# Patient Record
Sex: Male | Born: 1955 | Race: Black or African American | Hispanic: No | Marital: Single | Smoking: Current every day smoker
Health system: Southern US, Community
[De-identification: ages and names within clinical notes are randomized; demographics above are authoritative.]

## PROBLEM LIST (undated history)

## (undated) DIAGNOSIS — F319 Bipolar disorder, unspecified: Secondary | ICD-10-CM

## (undated) DIAGNOSIS — J45909 Unspecified asthma, uncomplicated: Secondary | ICD-10-CM

## (undated) DIAGNOSIS — F419 Anxiety disorder, unspecified: Secondary | ICD-10-CM

## (undated) DIAGNOSIS — E785 Hyperlipidemia, unspecified: Secondary | ICD-10-CM

## (undated) DIAGNOSIS — K219 Gastro-esophageal reflux disease without esophagitis: Secondary | ICD-10-CM

## (undated) DIAGNOSIS — F1010 Alcohol abuse, uncomplicated: Secondary | ICD-10-CM

## (undated) DIAGNOSIS — R972 Elevated prostate specific antigen [PSA]: Secondary | ICD-10-CM

## (undated) DIAGNOSIS — M1990 Unspecified osteoarthritis, unspecified site: Secondary | ICD-10-CM

## (undated) DIAGNOSIS — J449 Chronic obstructive pulmonary disease, unspecified: Secondary | ICD-10-CM

## (undated) DIAGNOSIS — F32A Depression, unspecified: Secondary | ICD-10-CM

## (undated) DIAGNOSIS — H269 Unspecified cataract: Secondary | ICD-10-CM

## (undated) DIAGNOSIS — I7090 Unspecified atherosclerosis: Secondary | ICD-10-CM

## (undated) DIAGNOSIS — F329 Major depressive disorder, single episode, unspecified: Secondary | ICD-10-CM

## (undated) DIAGNOSIS — T7840XA Allergy, unspecified, initial encounter: Secondary | ICD-10-CM

## (undated) DIAGNOSIS — I10 Essential (primary) hypertension: Secondary | ICD-10-CM

## (undated) HISTORY — PX: BACK SURGERY: SHX140

## (undated) HISTORY — DX: Anxiety disorder, unspecified: F41.9

## (undated) HISTORY — DX: Unspecified cataract: H26.9

## (undated) HISTORY — DX: Elevated prostate specific antigen (PSA): R97.20

## (undated) HISTORY — DX: Allergy, unspecified, initial encounter: T78.40XA

## (undated) HISTORY — DX: Alcohol abuse, uncomplicated: F10.10

## (undated) HISTORY — DX: Unspecified atherosclerosis: I70.90

## (undated) HISTORY — DX: Chronic obstructive pulmonary disease, unspecified: J44.9

## (undated) HISTORY — DX: Bipolar disorder, unspecified: F31.9

## (undated) HISTORY — DX: Unspecified asthma, uncomplicated: J45.909

## (undated) HISTORY — PX: SPINE SURGERY: SHX786

## (undated) HISTORY — DX: Hyperlipidemia, unspecified: E78.5

## (undated) HISTORY — DX: Unspecified osteoarthritis, unspecified site: M19.90

## (undated) SURGERY — TURP (TRANSURETHRAL RESECTION OF PROSTATE)
Anesthesia: Choice

---

## 1998-10-09 ENCOUNTER — Encounter: Payer: Self-pay | Admitting: Emergency Medicine

## 1998-10-09 ENCOUNTER — Inpatient Hospital Stay (HOSPITAL_COMMUNITY): Admission: EM | Admit: 1998-10-09 | Discharge: 1998-10-13 | Payer: Self-pay | Admitting: Emergency Medicine

## 1998-10-11 ENCOUNTER — Encounter: Payer: Self-pay | Admitting: Surgery

## 1998-10-12 ENCOUNTER — Encounter: Payer: Self-pay | Admitting: General Surgery

## 1998-10-13 ENCOUNTER — Encounter: Payer: Self-pay | Admitting: General Surgery

## 2002-07-21 ENCOUNTER — Emergency Department (HOSPITAL_COMMUNITY): Admission: EM | Admit: 2002-07-21 | Discharge: 2002-07-21 | Payer: Self-pay | Admitting: Emergency Medicine

## 2004-05-03 ENCOUNTER — Emergency Department (HOSPITAL_COMMUNITY): Admission: EM | Admit: 2004-05-03 | Discharge: 2004-05-03 | Payer: Self-pay | Admitting: *Deleted

## 2006-04-23 ENCOUNTER — Emergency Department (HOSPITAL_COMMUNITY): Admission: EM | Admit: 2006-04-23 | Discharge: 2006-04-23 | Payer: Self-pay | Admitting: Emergency Medicine

## 2006-05-27 ENCOUNTER — Ambulatory Visit: Payer: Self-pay | Admitting: Family Medicine

## 2006-05-27 DIAGNOSIS — K219 Gastro-esophageal reflux disease without esophagitis: Secondary | ICD-10-CM | POA: Insufficient documentation

## 2006-05-27 DIAGNOSIS — J45909 Unspecified asthma, uncomplicated: Secondary | ICD-10-CM | POA: Insufficient documentation

## 2006-05-27 DIAGNOSIS — H269 Unspecified cataract: Secondary | ICD-10-CM | POA: Insufficient documentation

## 2006-05-27 DIAGNOSIS — M129 Arthropathy, unspecified: Secondary | ICD-10-CM | POA: Insufficient documentation

## 2006-05-27 DIAGNOSIS — E785 Hyperlipidemia, unspecified: Secondary | ICD-10-CM | POA: Insufficient documentation

## 2006-05-27 DIAGNOSIS — M545 Low back pain, unspecified: Secondary | ICD-10-CM | POA: Insufficient documentation

## 2006-05-27 DIAGNOSIS — R5381 Other malaise: Secondary | ICD-10-CM | POA: Insufficient documentation

## 2006-05-27 DIAGNOSIS — F319 Bipolar disorder, unspecified: Secondary | ICD-10-CM | POA: Insufficient documentation

## 2006-05-27 DIAGNOSIS — F17200 Nicotine dependence, unspecified, uncomplicated: Secondary | ICD-10-CM | POA: Insufficient documentation

## 2006-05-27 DIAGNOSIS — R5383 Other fatigue: Secondary | ICD-10-CM

## 2006-05-27 DIAGNOSIS — G43009 Migraine without aura, not intractable, without status migrainosus: Secondary | ICD-10-CM | POA: Insufficient documentation

## 2006-05-27 DIAGNOSIS — F329 Major depressive disorder, single episode, unspecified: Secondary | ICD-10-CM | POA: Insufficient documentation

## 2006-05-27 DIAGNOSIS — J309 Allergic rhinitis, unspecified: Secondary | ICD-10-CM | POA: Insufficient documentation

## 2006-06-02 ENCOUNTER — Encounter (INDEPENDENT_AMBULATORY_CARE_PROVIDER_SITE_OTHER): Payer: Self-pay | Admitting: Family Medicine

## 2006-06-15 ENCOUNTER — Telehealth (INDEPENDENT_AMBULATORY_CARE_PROVIDER_SITE_OTHER): Payer: Self-pay | Admitting: Family Medicine

## 2006-06-26 ENCOUNTER — Encounter (INDEPENDENT_AMBULATORY_CARE_PROVIDER_SITE_OTHER): Payer: Self-pay | Admitting: Family Medicine

## 2006-06-26 LAB — CONVERTED CEMR LAB
Basophils Absolute: 0 10*3/uL (ref 0.0–0.1)
Basophils Relative: 1 % (ref 0–1)
Eosinophils Absolute: 0.1 10*3/uL (ref 0.0–0.7)
Eosinophils Relative: 2 % (ref 0–5)
HCT: 45.1 % (ref 39.0–52.0)
Hemoglobin: 15 g/dL (ref 13.0–17.0)
Lymphocytes Relative: 35 % (ref 12–46)
Lymphs Abs: 1.4 10*3/uL (ref 0.7–3.3)
MCHC: 33.3 g/dL (ref 30.0–36.0)
MCV: 91.7 fL (ref 78.0–100.0)
Monocytes Absolute: 0.6 10*3/uL (ref 0.2–0.7)
Monocytes Relative: 16 % — ABNORMAL HIGH (ref 3–11)
Neutro Abs: 1.9 10*3/uL (ref 1.7–7.7)
Neutrophils Relative %: 48 % (ref 43–77)
Platelets: 325 10*3/uL (ref 150–400)
RBC: 4.92 M/uL (ref 4.22–5.81)
RDW: 12.9 % (ref 11.5–14.0)
WBC: 4 10*3/uL (ref 4.0–10.5)

## 2006-06-29 ENCOUNTER — Ambulatory Visit: Payer: Self-pay | Admitting: Family Medicine

## 2006-08-18 ENCOUNTER — Encounter (INDEPENDENT_AMBULATORY_CARE_PROVIDER_SITE_OTHER): Payer: Self-pay | Admitting: Family Medicine

## 2006-08-25 ENCOUNTER — Encounter (INDEPENDENT_AMBULATORY_CARE_PROVIDER_SITE_OTHER): Payer: Self-pay | Admitting: Family Medicine

## 2006-08-27 ENCOUNTER — Ambulatory Visit: Payer: Self-pay | Admitting: Family Medicine

## 2006-08-27 ENCOUNTER — Telehealth (INDEPENDENT_AMBULATORY_CARE_PROVIDER_SITE_OTHER): Payer: Self-pay | Admitting: *Deleted

## 2006-09-01 ENCOUNTER — Ambulatory Visit (HOSPITAL_COMMUNITY): Admission: RE | Admit: 2006-09-01 | Discharge: 2006-09-01 | Payer: Self-pay | Admitting: Family Medicine

## 2006-09-01 ENCOUNTER — Telehealth (INDEPENDENT_AMBULATORY_CARE_PROVIDER_SITE_OTHER): Payer: Self-pay | Admitting: *Deleted

## 2006-09-01 ENCOUNTER — Encounter (INDEPENDENT_AMBULATORY_CARE_PROVIDER_SITE_OTHER): Payer: Self-pay | Admitting: Family Medicine

## 2006-09-02 ENCOUNTER — Telehealth (INDEPENDENT_AMBULATORY_CARE_PROVIDER_SITE_OTHER): Payer: Self-pay | Admitting: *Deleted

## 2006-09-03 ENCOUNTER — Ambulatory Visit: Payer: Self-pay | Admitting: Family Medicine

## 2006-09-07 ENCOUNTER — Encounter (INDEPENDENT_AMBULATORY_CARE_PROVIDER_SITE_OTHER): Payer: Self-pay | Admitting: Family Medicine

## 2006-09-10 ENCOUNTER — Encounter (INDEPENDENT_AMBULATORY_CARE_PROVIDER_SITE_OTHER): Payer: Self-pay | Admitting: Family Medicine

## 2006-09-14 ENCOUNTER — Encounter (INDEPENDENT_AMBULATORY_CARE_PROVIDER_SITE_OTHER): Payer: Self-pay | Admitting: Family Medicine

## 2006-09-18 ENCOUNTER — Encounter (INDEPENDENT_AMBULATORY_CARE_PROVIDER_SITE_OTHER): Payer: Self-pay | Admitting: Family Medicine

## 2006-09-28 ENCOUNTER — Ambulatory Visit: Payer: Self-pay | Admitting: Family Medicine

## 2006-09-28 DIAGNOSIS — R05 Cough: Secondary | ICD-10-CM | POA: Insufficient documentation

## 2006-09-29 ENCOUNTER — Encounter (INDEPENDENT_AMBULATORY_CARE_PROVIDER_SITE_OTHER): Payer: Self-pay | Admitting: Family Medicine

## 2006-10-05 ENCOUNTER — Ambulatory Visit (HOSPITAL_COMMUNITY): Admission: RE | Admit: 2006-10-05 | Discharge: 2006-10-05 | Payer: Self-pay | Admitting: Family Medicine

## 2006-10-05 ENCOUNTER — Encounter (INDEPENDENT_AMBULATORY_CARE_PROVIDER_SITE_OTHER): Payer: Self-pay | Admitting: Family Medicine

## 2006-10-06 ENCOUNTER — Telehealth (INDEPENDENT_AMBULATORY_CARE_PROVIDER_SITE_OTHER): Payer: Self-pay | Admitting: *Deleted

## 2006-10-07 ENCOUNTER — Telehealth (INDEPENDENT_AMBULATORY_CARE_PROVIDER_SITE_OTHER): Payer: Self-pay | Admitting: Family Medicine

## 2006-10-07 ENCOUNTER — Telehealth (INDEPENDENT_AMBULATORY_CARE_PROVIDER_SITE_OTHER): Payer: Self-pay | Admitting: *Deleted

## 2006-10-07 LAB — CONVERTED CEMR LAB
ALT: 35 units/L (ref 0–53)
AST: 56 units/L — ABNORMAL HIGH (ref 0–37)
Albumin: 4.4 g/dL (ref 3.5–5.2)
Alkaline Phosphatase: 60 units/L (ref 39–117)
Bilirubin, Direct: 0.1 mg/dL (ref 0.0–0.3)
Cholesterol: 232 mg/dL — ABNORMAL HIGH (ref 0–200)
HDL: 65 mg/dL (ref >39)
Indirect Bilirubin: 0.5 mg/dL (ref 0.0–0.9)
LDL Cholesterol: 127 mg/dL — ABNORMAL HIGH (ref 0–99)
Total Bilirubin: 0.6 mg/dL (ref 0.3–1.2)
Total CHOL/HDL Ratio: 3.6
Total Protein: 7.4 g/dL (ref 6.0–8.3)
Triglycerides: 200 mg/dL — ABNORMAL HIGH (ref <150)
VLDL: 40 mg/dL (ref 0–40)

## 2006-11-09 ENCOUNTER — Ambulatory Visit: Payer: Self-pay | Admitting: Family Medicine

## 2006-11-09 DIAGNOSIS — R945 Abnormal results of liver function studies: Secondary | ICD-10-CM | POA: Insufficient documentation

## 2006-11-09 LAB — CONVERTED CEMR LAB: LDL Goal: 160 mg/dL

## 2006-11-10 ENCOUNTER — Telehealth (INDEPENDENT_AMBULATORY_CARE_PROVIDER_SITE_OTHER): Payer: Self-pay | Admitting: Family Medicine

## 2006-11-19 ENCOUNTER — Ambulatory Visit (HOSPITAL_COMMUNITY): Admission: RE | Admit: 2006-11-19 | Discharge: 2006-11-19 | Payer: Self-pay | Admitting: Family Medicine

## 2006-11-19 ENCOUNTER — Encounter (INDEPENDENT_AMBULATORY_CARE_PROVIDER_SITE_OTHER): Payer: Self-pay | Admitting: Family Medicine

## 2006-11-26 ENCOUNTER — Telehealth (INDEPENDENT_AMBULATORY_CARE_PROVIDER_SITE_OTHER): Payer: Self-pay | Admitting: *Deleted

## 2006-12-21 ENCOUNTER — Ambulatory Visit: Payer: Self-pay | Admitting: Family Medicine

## 2006-12-21 DIAGNOSIS — J449 Chronic obstructive pulmonary disease, unspecified: Secondary | ICD-10-CM | POA: Insufficient documentation

## 2007-01-15 ENCOUNTER — Encounter (INDEPENDENT_AMBULATORY_CARE_PROVIDER_SITE_OTHER): Payer: Self-pay | Admitting: Family Medicine

## 2007-06-07 ENCOUNTER — Encounter (INDEPENDENT_AMBULATORY_CARE_PROVIDER_SITE_OTHER): Payer: Self-pay | Admitting: Family Medicine

## 2007-06-24 ENCOUNTER — Ambulatory Visit: Payer: Self-pay | Admitting: Family Medicine

## 2007-06-24 DIAGNOSIS — R079 Chest pain, unspecified: Secondary | ICD-10-CM | POA: Insufficient documentation

## 2007-06-25 ENCOUNTER — Telehealth (INDEPENDENT_AMBULATORY_CARE_PROVIDER_SITE_OTHER): Payer: Self-pay | Admitting: Family Medicine

## 2007-06-28 ENCOUNTER — Encounter (INDEPENDENT_AMBULATORY_CARE_PROVIDER_SITE_OTHER): Payer: Self-pay | Admitting: Family Medicine

## 2007-06-29 ENCOUNTER — Ambulatory Visit (HOSPITAL_COMMUNITY): Admission: RE | Admit: 2007-06-29 | Discharge: 2007-06-29 | Payer: Self-pay | Admitting: Family Medicine

## 2007-06-29 ENCOUNTER — Telehealth (INDEPENDENT_AMBULATORY_CARE_PROVIDER_SITE_OTHER): Payer: Self-pay | Admitting: Family Medicine

## 2007-06-29 ENCOUNTER — Ambulatory Visit: Payer: Self-pay | Admitting: Family Medicine

## 2007-07-08 ENCOUNTER — Telehealth (INDEPENDENT_AMBULATORY_CARE_PROVIDER_SITE_OTHER): Payer: Self-pay | Admitting: *Deleted

## 2007-07-08 ENCOUNTER — Ambulatory Visit: Payer: Self-pay | Admitting: Family Medicine

## 2007-07-08 DIAGNOSIS — D72819 Decreased white blood cell count, unspecified: Secondary | ICD-10-CM | POA: Insufficient documentation

## 2007-07-09 ENCOUNTER — Telehealth (INDEPENDENT_AMBULATORY_CARE_PROVIDER_SITE_OTHER): Payer: Self-pay | Admitting: *Deleted

## 2007-07-09 LAB — CONVERTED CEMR LAB
PSA: 2.17 ng/mL (ref 0.10–4.00)
TSH: 0.739 microintl units/mL (ref 0.350–5.50)

## 2007-07-14 ENCOUNTER — Encounter (INDEPENDENT_AMBULATORY_CARE_PROVIDER_SITE_OTHER): Payer: Self-pay | Admitting: Family Medicine

## 2007-09-14 ENCOUNTER — Encounter (INDEPENDENT_AMBULATORY_CARE_PROVIDER_SITE_OTHER): Payer: Self-pay | Admitting: Family Medicine

## 2007-12-10 ENCOUNTER — Encounter (INDEPENDENT_AMBULATORY_CARE_PROVIDER_SITE_OTHER): Payer: Self-pay | Admitting: Family Medicine

## 2008-09-05 ENCOUNTER — Encounter (INDEPENDENT_AMBULATORY_CARE_PROVIDER_SITE_OTHER): Payer: Self-pay | Admitting: Family Medicine

## 2009-12-20 ENCOUNTER — Encounter: Payer: Self-pay | Admitting: Internal Medicine

## 2009-12-21 ENCOUNTER — Ambulatory Visit (HOSPITAL_COMMUNITY): Admission: RE | Admit: 2009-12-21 | Discharge: 2009-12-21 | Payer: Self-pay | Admitting: Internal Medicine

## 2009-12-21 ENCOUNTER — Ambulatory Visit: Payer: Self-pay | Admitting: Internal Medicine

## 2009-12-21 HISTORY — PX: COLONOSCOPY: SHX174

## 2009-12-25 ENCOUNTER — Encounter: Payer: Self-pay | Admitting: Internal Medicine

## 2010-03-24 LAB — CONVERTED CEMR LAB
ALT: 34 units/L (ref 0–53)
AST: 47 units/L — ABNORMAL HIGH (ref 0–37)
Albumin: 4.1 g/dL (ref 3.5–5.2)
Alkaline Phosphatase: 53 units/L (ref 39–117)
BUN: 8 mg/dL (ref 6–23)
Basophils Absolute: 0 10*3/uL (ref 0.0–0.1)
Basophils Relative: 0 % (ref 0–1)
CO2: 24 meq/L (ref 19–32)
Calcium: 9.1 mg/dL (ref 8.4–10.5)
Chloride: 105 meq/L (ref 96–112)
Cholesterol, target level: 200 mg/dL
Creatinine, Ser: 0.85 mg/dL (ref 0.40–1.50)
Eosinophils Absolute: 0.1 10*3/uL (ref 0.0–0.7)
Eosinophils Relative: 2 % (ref 0–5)
Glucose, Bld: 122 mg/dL — ABNORMAL HIGH (ref 70–99)
HCT: 43.4 % (ref 39.0–52.0)
HDL goal, serum: 40 mg/dL
Hemoglobin: 15.3 g/dL (ref 13.0–17.0)
LDL Goal: 130 mg/dL
Lymphocytes Relative: 43 % (ref 12–46)
Lymphs Abs: 1.5 10*3/uL (ref 0.7–4.0)
MCHC: 35.1 g/dL (ref 30.0–36.0)
MCV: 90.8 fL (ref 78.0–100.0)
Monocytes Absolute: 0.6 10*3/uL (ref 0.1–1.0)
Monocytes Relative: 17 % — ABNORMAL HIGH (ref 3–12)
Neutro Abs: 1.3 10*3/uL — ABNORMAL LOW (ref 1.7–7.7)
Neutrophils Relative %: 38 % — ABNORMAL LOW (ref 43–77)
Platelets: 176 10*3/uL (ref 150–400)
Potassium: 4.5 meq/L (ref 3.5–5.3)
RBC: 4.78 M/uL (ref 4.22–5.81)
RDW: 13.4 % (ref 11.5–15.5)
Sodium: 136 meq/L (ref 135–145)
Total Bilirubin: 0.5 mg/dL (ref 0.3–1.2)
Total Protein: 7 g/dL (ref 6.0–8.3)
WBC: 3.4 10*3/uL — ABNORMAL LOW (ref 4.0–10.5)

## 2010-03-26 NOTE — Miscellaneous (Signed)
[facility]   Imported By: Curtis Sites 07/01/2007 14:56:28  _____________________________________________________________________  External Attachment:    Type:   Image     Comment:   External Document

## 2010-03-26 NOTE — Letter (Signed)
Summary: H&P  H&P   Imported By: Magdalene River 06/02/2006 13:30:42  _____________________________________________________________________  External Attachment:    Type:   Image     Comment:   External Document

## 2010-03-26 NOTE — Letter (Signed)
Summary: Request for [facility] services  Request for [facility] services   Imported By: Pablo Lawrence 08/26/2006 15:43:38  _____________________________________________________________________  External Attachment:    Type:   Image     Comment:   External Document

## 2010-03-26 NOTE — Letter (Signed)
Summary: [facility] fax  [facility] fax   Imported By: Pablo Lawrence 08/19/2006 14:57:23  _____________________________________________________________________  External Attachment:    Type:   Image     Comment:   External Document

## 2010-03-26 NOTE — Miscellaneous (Signed)
Summary: [facility]  [facility]   Imported By: Lutricia Horsfall 09/23/2007 13:41:24  _____________________________________________________________________  External Attachment:    Type:   Image     Comment:   External Document

## 2010-03-26 NOTE — Progress Notes (Signed)
Summary: chest xray follow up  Phone Note Outgoing Call   Call placed by: Sonny Dandy,  Jun 25, 2007 8:36 AM Summary of Call: Called radiology([facility]) and they reported patient has not had chest xay. message left for patient to return call on answering machine Sonny Dandy  Jun 25, 2007 8:37 AM  Message left for Duane Richardson to return call. Sonny Dandy  Jun 25, 2007 2:40 PM  Initial call taken by: Sonny Dandy,  Jun 25, 2007 2:40 PM  Follow-up for Phone Call        Keep on him. Follow-up by: Franchot Heidelberg MD,  Jun 25, 2007 8:44 AM  Additional Follow-up for Phone Call Additional follow up Details #1::        See report. Additional Follow-up by: Franchot Heidelberg MD,  Jun 29, 2007 9:00 AM

## 2010-03-26 NOTE — Progress Notes (Signed)
Summary: called to inform pt of new appt with [facility]  Phone Note Outgoing Call   Call placed by: Pablo Lawrence Call placed to: Patient Action Taken: Appt scheduled Summary of Call: Called pt. to inform him of appointment with [facility] on 10-09-06 at 10:30 am and to remind him to obtain a copy of his MRI to take with him.Pt. states that he cannot take information due to inability to write or spell,asked to speak to spouse and told wife about appointment place and time.Pt. told wife to tell me he has no way to get to [facility] pt. he had a month to attempt to arrange a ride and pt. states he cannot do it.Asked pt. if he would like me to cancel appointment? pt. states "no" just call him back to remind him of the appointment time,attempted to give the pt. the phone number from [facility] and pt. states " my phone does not call long distance can't you call me and remind me"?Explained to pts. wife that this was their responsibility to keep this appointment and get transportation there.Pts. wife states"i will tell him". Initial call taken by: Pablo Lawrence,  September 02, 2006 10:20 AM

## 2010-03-26 NOTE — Letter (Signed)
Summary: [facility]  [facility]   Imported By: Rexene Alberts 12/20/2009 08:49:44  _____________________________________________________________________  External Attachment:    Type:   Image     Comment:   External Document

## 2010-03-26 NOTE — Letter (Signed)
Summary: overnight oximetry   overnight oximetry   Imported By: Donneta Romberg 08/02/2007 17:34:48  _____________________________________________________________________  External Attachment:    Type:   Image     Comment:   External Document

## 2010-03-26 NOTE — Miscellaneous (Signed)
Summary: Orders Update  Clinical Lists Changes  Orders: Added new Test order of MRI (MRI) - Signed 

## 2010-03-26 NOTE — Assessment & Plan Note (Signed)
Summary: new patient/arc   Vital Signs:  Patient Profile:   55 Years Old Male Height:     72 inches Weight:      156 pounds BMI:     21.23 O2 Sat:      96 % Temp:     98.5 degrees F Pulse rate:   116 / minute Resp:     16 per minute BP sitting:   123 / 89  Vitals Entered By: Sherilyn Banker (May 27, 2006 1:52 PM)               PCP:  Franchot Heidelberg  Chief Complaint:  establish .  History of Present Illness: Pt in today with case worker to establish.  He has not seen an MD in 20 years and has been using ED for care if needed.  He has a hx of depression and sees Dr. Rudi Heap for this. He last saw him for this a month ago. This is stable. Sees him and councellor monthly. he is not irritable. His concentration is good. No hx of suicide attempt eight years ago - tried to cut heart out. Tolerating medications well.  He now presents.   Prior Medications: ZOLOFT 50 MG TABS (SERTRALINE HCL) two times a day Current Allergies (reviewed today): No known allergies   Past Surgical History:    Back surgery for herniateddisc at [facility] - patient not sure what they did   Family History:    Father: Dead Not sure - possibly liung disase    Mother: Dead Not sure    Siblings: 42 Brothers and 8 Sisters - 3 sets of twins - healthy  Social History:    Occupation: Land    Single    Current Smoker    Alcohol use-no    Drug use-no    Disable from back and depression   Risk Factors:  Tobacco use:  current    Year started:  23 year hx    Cigarettes:  Yes -- 2 pack(s) per day    Counseled to quit/cut down tobacco use:  yes Drug use:  no Alcohol use:  no  Family History Risk Factors:    Family History of MI in females < 33 years old:  no    Family History of MI in males < 76 years old:  no   Review of Systems  General      Complains of fever and malaise.      Denies chills and sweats.      Pt notes he is sleepy - has been for years. Day night cycle reversed.    Eyes      Catarcts by hx.  Notes he has glasses  -not wearing today.  ENT      Denies decreased hearing, earache, ringing in ears, and sinus pressure.  CV      Denies chest pain or discomfort, fainting, shortness of breath with exertion, swelling of feet, and swelling of hands.  Resp      Complains of sputum productive.      Denies chest discomfort, cough, shortness of breath, and wheezing.      Pt has long hx of smoking. Has cough occasionally with yellowphlegm. Has hx of asthma but not sure.   GI      Denies abdominal pain, constipation, diarrhea, nausea, and vomiting.      Never had screening colonoscopy  GU      Complains of nocturia.      Denies  discharge, erectile dysfunction, and incontinence.      Stream strong. Goes to bathroom 3 to 4 times a night.   MS      Complains of low back pain.      Denies joint pain, joint swelling, muscle weakness, and stiffness.      Pt has long hx of back pain. He had suregy in past. He denies pain today. Gets this in lower back. No radiation. Goes to ED at [facility] - gets med. States has crampyness in right ant thigh and side of it. No trauma. Echart checked - no recent xrays.  Derm      Denies changes in color of skin, changes in nail beds, dryness, excessive perspiration, flushing, hair loss, insect bite(s), itching, lesion(s), poor wound healing, and rash.  Neuro      Denies brief paralysis, headaches, tingling, tremors, and weakness.  Psych      See HPI  Endo      Denies cold intolerance, excessive hunger, excessive thirst, excessive urination, heat intolerance, polyuria, and weight change.  Heme      Denies abnormal bruising, bleeding, enlarge lymph nodes, fevers, pallor, and skin discoloration.  Allergy      Denies hives or rash, itching eyes, persistent infections, seasonal allergies, and sneezing.   Physical Exam  General:      Well-developed,well-nourished,in no acute distress; alert,appropriate and cooperative throughout examination Head:     Normocephalic and atraumatic without obvious abnormalities. No apparent alopecia or balding. Eyes:     No corneal or conjunctival inflammation noted. EOMI. Perrla. Funduscopic exam benign, without hemorrhages, exudates or papilledema. Vision grossly normal. Ears:     External ear exam shows no significant lesions or deformities.  Otoscopic examination reveals clear canals, tympanic membranes are intact bilaterally without bulging, retraction, inflammation or discharge. Hearing is grossly normal bilaterally. Nose:     External nasal examination shows no deformity or inflammation. Nasal mucosa are pink and moist without lesions or exudates. Mouth:     Oral mucosa and oropharynx without lesions or exudates.  Neck:     No deformities, masses, or tenderness noted. Lungs:     Decreased BS bilaterally Heart:     Normal rate and regular rhythm. S1 and S2 normal without gallop, murmur, click, rub or other extra sounds. Abdomen:     Bowel sounds positive,abdomen soft and non-tender without masses, organomegaly or hernias noted. Extremities:     No clubbing, cyanosis, edema, or deformity noted with normal full range of motion of all joints.   Neurologic:     No cranial nerve deficits noted. Station and gait are normal. Plantar reflexes are down-going bilaterally. DTRs are symmetrical throughout. Sensory, motor and coordinative functions appear intact. Scar lower back with neg SLT and reflexes 2+ and symmetric. Skin:     Intact without suspicious lesions or rashes. Dry foot soles with callous and a corn between 4th and fift toes on 4 th toe. No signs infection. Cervical Nodes:     No lymphadenopathy noted Psych:     Cognition and judgment appear intact. Alert and cooperative with normal attention span and concentration. No apparent delusions, illusions, hallucinations     Impression & Recommendations:  Problem # 1:  MALAISE AND FATIGUE (ICD-780.79) Check labs as below to assure no organic cause. He has extensive hx of smoking and certainly COPD should be considered. See below. Suspect more likely related to depression. Await results and optomize. Orders: T-CBC w/Diff ([id number]) T-TSH [id number])   Problem #  2:  HYPERLIPIDEMIA (ICD-272.4) Check labs and optomize per ATP III. Orders: T-Comprehensive Metabolic Panel [id number]) T-Lipid Profile ([id number])   Problem # 3:  LOW BACK PAIN, CHRONIC (ICD-724.2) Discussed need for back exersize and strength improvement with good posture. If sx develop, obtain l-spine films and optomize.  Problem # 4:  TOBACCO ABUSE (ICD-305.1) Councelled on cessation. Agreeable. Start gum per request and consider PFTS next visit as he most certainly has findings of COPD. His updated medication list for this problem includes:    Nicorette Starter Kit 4 Mg Gum (Nicotine polacrilex) .Marland Kitchen... As directed with step wise plan   Problem # 5:  [facility] podiatry - appt with Dr. TuckerApril 11, at 09h00. Case worker and patient informed. Advised on pomice stone and lubrication.  Problem # 6:  Preventive Health Care (ICD-V70.0) Needs rectal and colonoscopy referal next visit. Check PFTs.  Problem # 7:  DEPRESSION (ICD-311) Optomize per Dr. Rudi Heap. His updated medication list for this problem includes:    Zoloft 50 Mg Tabs (Sertraline hcl) .Marland Kitchen..Marland Kitchen Two times a day   Medications Added to Medication List This Visit: 1)  Nicorette Starter Kit 4 Mg Gum (Nicotine polacrilex) .... As directed with step wise plan  Other Orders: T-PSA Total [id number])

## 2010-03-26 NOTE — Progress Notes (Signed)
Summary: 10/05/06 xray results  Phone Note Outgoing Call   Call placed by: Sonny Dandy,  October 06, 2006 11:34 AM Summary of Call: called, line busy .................................................................Marland KitchenMarland KitchenSonny Dandy  October 06, 2006 11:34 AM  Results given to patient, voices understanding .................................................................Marland KitchenMarland KitchenSonny Dandy  October 07, 2006 10:05 AM  Initial call taken by: Sonny Dandy,  October 07, 2006 10:05 AM

## 2010-03-26 NOTE — Letter (Signed)
Summary: [facility]  [facility]   Imported By: Lutricia Horsfall 03/23/2007 15:35:27  _____________________________________________________________________  External Attachment:    Type:   Image     Comment:   External Document

## 2010-03-26 NOTE — Assessment & Plan Note (Signed)
Summary: Home Health eval   Vital Signs:  Patient Profile:   55 Years Old Male Height:     72 inches Weight:      156 pounds BMI:     21.23 O2 Sat:      98 % Temp:     97.6 degrees F Pulse rate:   92 / minute Resp:     14 per minute BP sitting:   130 / 88  Vitals Entered By: Sherilyn Banker (August 27, 2006 9:09 AM)               PCP:  Franchot Heidelberg  Chief Complaint:  needs home health/ L leg pain.  History of Present Illness: Pt comes in for eval of Home Health request.  We have received multiple requests from both [facility] and [facility] for this.   The patient notes he has a lot left leg pain. This hinders his ability to funtion at home. He states he has had pain for a while. He states he has cramping in his legs. Starts in knee andgoes to shin. He also has back pain. Hx of DDD. He describes this as sharp and rates as 7/10. Radiates to upper back. Has hx of back surgery in past. He is not on any treatment for this and he adds he was seen for this at [facility] in the past. He did have surgery but is not sure what they did. He denies urinary incontinence and fecal incontinence. He feels weak in his left leg. He has numbness and tingling at time. Notes he has been lying in bed from all the pain. Has not done much at home.  He states he wants home health to come to his house and help him with getting up, cleaning, help with his medication administration. States he has had services in the past and he did not get charged for this.  He states his back is his main concern. If this felt better he could do more at home.  He is tearful today and states it is hard to struggle so much.  Now presents.  Current Allergies (reviewed today): No known allergies   Past Medical History:    Reviewed history from 05/27/2006 and no changes required:       Allergic rhinitis       Asthma       Depression       GERD  Past Surgical History:     Reviewed history from 05/27/2006 and no changes required:       back surgery   Family History:    Reviewed history from 05/27/2006 and no changes required:       Father: Dead Not sure - possibly liung disase       Mother: Dead Not sure       Siblings: 49 Brothers and 8 Sisters - 3 sets of twins - healthy  Social History:    Reviewed history from 05/27/2006 and no changes required:       Single       Current Smoker       Alcohol use-no       Drug use-yes    Review of Systems      See HPI   Physical Exam  General:     Well-developed,well-nourished,in no acute distress; alert,appropriate and cooperative throughout examination. He is tearful and moves very slowly to exam table - new finding today. He has hard time turning around and sights severe back  pain. Lays down with reluctance. Lungs:     Normal respiratory effort, chest expands symmetrically. Lungs are clear to auscultation, no crackles or wheezes. Heart:     RRR. Abdomen:     Soft, NT, BS + Extremities:     No clubbing, cyanosis, edema, or deformity noted with normal full range of motion of all joints.   Neurologic:     Pt has surgicalscar lower bck related to prior hx of back surgery. Mod paraspinous muscle spasm at superior end of incision and tender to touch. He has normal symmetrial reflexes at 2+ with positive SLT on Left at 60 degrees reporting severe pain in lower back and left leg. Gait is slow and he bends over. Can toe and heel walk but sights some difficulty. Strength 5/5. Sensation grossly intact.    Impression & Recommendations:  Problem # 1:  LOW BACK PAIN, CHRONIC (ICD-724.2)  Discussed. He needs to have this evaluated prior to determining need for home health. His findings are new today and was not present previously. 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 I have councelled him on need for PT and not to remain in bed as this will exacerbate sx. He was councelled on need for back exersizes. We will get L-spinefilms and MRI. I am not going to give him Home Health at this point as this is a new finding since his last visit and he has not had any treatement or evaluation for this. I did councel him we will await results and optomize from there.We will recheck in one week and depending on findings will consider authorizing this. He seemed to understand. Nurse to notify [facility] of this fact. We will request his old records from [facility] to see what was done and waht diagnosis was. His updated medication list for this problem includes:    Amrix 30 Mg Cp24 (Cyclobenzaprine hcl) ..... One daily as needed for back pain   Medications Added to Medication List This Visit: 1)  Medrol (pak) 4 Mg Tabs (Methylprednisolone) .... As directed 2)  Amrix 30 Mg Cp24 (Cyclobenzaprine hcl) .... One daily as needed for back pain   Patient Instructions: 1)  Please schedule a follow-up appointment in 1 weeks.    Prescriptions: AMRIX 30 MG  CP24 (CYCLOBENZAPRINE HCL) One daily as needed for back pain  #15 x 0   Entered and Authorized by:   Franchot Heidelberg MD   Signed by:   Franchot Heidelberg MD on 08/27/2006   Method used:   Print then Give to Patient   RxID:   [id number] MEDROL (PAK) 4 MG  TABS (METHYLPREDNISOLONE) As directed  #1 x 0   Entered and Authorized by:   Franchot Heidelberg MD   Signed by:   Franchot Heidelberg MD on 08/27/2006   Method used:   Print then Give to Patient   RxID:   [id number]       Appended Document: Orders Update    Clinical Lists Changes  Orders:  Added new Service order of Ketorolac-Toradol 15mg  [phone]) - Signed Added new Service order of Admin of Therapeutic Inj  intramuscular or subcutaneous ([id number]) - Signed       Medication Administration  Injection # 1:    Medication: Ketorolac-Toradol 15mg     Diagnosis: LOW BACK PAIN, CHRONIC (ICD-724.2)    Route: IM    Site: R thigh    Exp Date: 01/25/2008  Lot #: [id number]    Mfr: Abraxis    Patient tolerated injection without complications    Given by: Sherilyn Banker (August 27, 2006 9:47 AM)  Orders Added: 1)  Ketorolac-Toradol 15mg  [[id number]] 2)  Admin of Therapeutic Inj  intramuscular or subcutaneous [[id number]]  Appended Document: [facility] and hopefully [facility] will call me back if they have any questions...[id number].Marland KitchenMarland Kitchenarc

## 2010-03-26 NOTE — Progress Notes (Signed)
Summary: 07/08/07 lab result  Phone Note Outgoing Call   Call placed by: Sonny Dandy,  Jul 09, 2007 8:16 AM Summary of Call: Results given to patient, voices understanding   Initial call taken by: Sonny Dandy,  Jul 09, 2007 8:16 AM

## 2010-03-26 NOTE — Miscellaneous (Signed)
Summary: Orders Update PFT  Clinical Lists Changes  Orders: Added new Test order of PFT's Baseline w/ DLCO (PFT's Baseline-DLCO) - Signed   Appended Document: Orders Update PFT referral letter mailed to patient

## 2010-03-26 NOTE — Assessment & Plan Note (Signed)
Summary: 6 WEEK FOLLOW UP/ARC   Vital Signs:  Patient Profile:   55 Years Old Male Height:     72 inches Weight:      161 pounds BMI:     21.91 O2 Sat:      99 % Temp:     97.3 degrees F Pulse rate:   78 / minute Resp:     14 per minute BP sitting:   115 / 79  Vitals Entered By: Sherilyn Banker (November 09, 2006 10:21 AM)                 PCP:  Franchot Heidelberg  Chief Complaint:  pt states he cannot sleep at night.  History of Present Illness: Pt in for recheck.  He recently had a visit and was refered for full PFTS. He missed this appt and notes they called him and he has rescheduled. States he had to send some paperwork and they will be calling him soon. He denies cough and sputum production. He is still smoking - pack a day. CXR done showed COPD. Would be agreebale with medication.  He had labs done as well. This is reviewed:  1. Lipids - high - see below 2. LFTS: Increased AST  He admits to occasional alcohol use. Loves beer. He admits he drinks 4 bottles at a time - does this few tims during week and mostly on weekends.  He has DDD in his lower back. Not sure about appt. States case worker has date. States doing fine now. No sx of radiculopathy today. Notes able to walk and talk and do what he likes.No problems.  Now presents.  Lipid Management History:      Positive NCEP/ATP III risk factors include male age 43 years old or older and current tobacco user.  Negative NCEP/ATP III risk factors include non-diabetic, HDL cholesterol greater than 60, no family history for ischemic heart disease, non-hypertensive, no ASHD (atherosclerotic heart disease), no prior stroke/TIA, no peripheral vascular disease, and no history of aortic aneurysm.         The patient states that he knows about the "Therapeutic Lifestyle Change" diet.  His compliance with the TLC diet is good.  The patient expresses understanding of adjunctive measures for cholesterol lowering.  Adjunctive measures started by the patient include aerobic exercise, fiber, ASA, and omega-3 supplements.  Comments: Agrees with TLC only  -not sure he wants lipid lowering pills with ETOH use.   Current Allergies (reviewed today): No known allergies   Past Medical History:    Reviewed history from 05/27/2006 and no changes required:       Allergic rhinitis       Asthma       Depression       GERD  Past Surgical History:    Reviewed history from 05/27/2006 and no changes required:       back surgery   Family History:    Reviewed history from 05/27/2006 and no changes required:       Father: Dead Not sure - possibly liung disase       Mother: Dead Not sure       Siblings: 43 Brothers and 8 Sisters - 3 sets of twins - healthy  Social History:    Reviewed history from 05/27/2006 and no changes required:       Single       Current Smoker       Alcohol use-no       Drug use-yes  Review of Systems      See HPI  General      Denies chills, fever, and sweats.  Resp      Denies cough, shortness of breath, sputum productive, and wheezing.  GI      Denies abdominal pain, constipation, diarrhea, nausea, and vomiting.  GU      Denies decreased libido, nocturia, urinary frequency, and urinary hesitancy.   Physical Exam  General:     Well-developed,well-nourished,in no acute distress; alert,appropriate and cooperative throughout examination. Very sleepy initially. States he is up at night and rests during daytime. Lungs:     Normal respiratory effort, chest expands symmetrically. Lungs are clear to auscultation, no crackles or wheezes. Heart:     Normal rate and regular rhythm. S1 and S2 normal without gallop, murmur, click, rub or other extra sounds. Abdomen:      Bowel sounds positive,abdomen soft and non-tender without masses, organomegaly or hernias noted. Extremities:     No clubbing, cyanosis, edema, or deformity noted with normal full range of motion of all joints.   Psych:     Cognition and judgment appear intact. Alert and cooperative with normal attention span and concentration. No apparent delusions, illusions, hallucinations    Impression & Recommendations:  Problem # 1:  SYMPTOM, COUGH (ICD-786.2) COPD per CXR. Trial Spiriva. Councelled risk and benefitl. Advised smoking cessation. Not open to Rx - notes he will try on own. Aware of risk and benefit.  Will ask nurse to check on when he is set for full PFTS.  Problem # 2:  TOBACCO ABUSE (ICD-305.1) See above. Has not used step system per report today. Encouraged to try. His updated medication list for this problem includes:    Nicorette Starter Kit 4 Mg Gum (Nicotine polacrilex) .Marland Kitchen... As directed with step wise plan   Problem # 3:  HYPERLIPIDEMIA (ICD-272.4) Not open to Rx. TLC for 6 months. Councelled diet, exersize and lifestyle change.  Problem # 4:  LOW BACK PAIN, CHRONIC (ICD-724.2) Nurse to check on Neurosurgial appt.Denies pain and sx today. Follow. The following medications were removed from the medication list:    Amrix 30 Mg Cp24 (Cyclobenzaprine hcl) ..... One daily as needed for back pain   Problem # 5:  LIVER FUNCTION TESTS, ABNORMAL (ICD-794.8) Increased AST. Advised need to DC ETOH. Recheck 6 weeks. If elevation persist, check GGT. Councelled ETOH risk  Complete Medication List: 1)  Zoloft 50 Mg Tabs (Sertraline hcl) .... Two times a day 2)  Nicorette Starter Kit 4 Mg Gum (Nicotine polacrilex) .... As directed with step wise plan 3)  Spiriva Handihaler 18 Mcg Caps (Tiotropium bromide monohydrate) .... One puff daily - please re-educate on use as well  Lipid Assessment/Plan:       Based on NCEP/ATP III, the patient's risk factor category is "0-1 risk factors".  From this information, the patient's calculated lipid goals are as follows: Total cholesterol goal is 200; LDL cholesterol goal is 160; HDL cholesterol goal is 40; Triglyceride goal is 150.     Patient Instructions: 1)  Please schedule a follow-up appointment in 6 weeks.    Prescriptions: SPIRIVA HANDIHALER 18 MCG  CAPS (TIOTROPIUM BROMIDE MONOHYDRATE) One puff daily - please re-educate on use as well  #30 days x 6   Entered and Authorized by:   Franchot Heidelberg MD   Signed by:   Franchot Heidelberg MD on 11/09/2006   Method used:   Print then Give to Patient   RxID:  [id number]  ] 

## 2010-03-26 NOTE — Progress Notes (Signed)
Summary: appt with [facility]  Phone Note Call from Patient   Reason for Call: Talk to Doctor Summary of Call: patient has an appt with [facility] for November 21st.....   Initial call taken by: Donneta Romberg,  November 26, 2006 2:58 PM

## 2010-03-26 NOTE — Assessment & Plan Note (Signed)
Summary: 3 MON F/U   Vital Signs:  Patient Profile:   55 Years Old Male Height:     72 inches Weight:      160 pounds BMI:     21.78 O2 Sat:      98 % Temp:     97.4 degrees F Pulse rate:   88 / minute Resp:     14 per minute BP sitting:   132 / 82  Vitals Entered By: Sherilyn Banker (September 28, 2006 10:06 AM)               PCP:  Franchot Heidelberg  Chief Complaint:  follow up visit.  History of Present Illness: Pt in for recheck.  She notes he is doing well.  He has had a bad bout of back pain but notes this is doing well. Has occasional sharp pain in lower back. Doies bother left leg a little. He describes some numbness. He notes he can live with it. He is set to see Neurosurgery on October 09, 2006 - case worker aware.   He notes he is doing well. He has a girlfriend who cooks and cleans for him. She is a Dispensing optician. He notes he is bathing and showering himself. He notes he sleeps most of the day and watches television all night.   Now presents.  Hypertension History:      He denies headache, chest pain, palpitations, dyspnea with exertion, orthopnea, PND, peripheral edema, visual symptoms, neurologic problems, syncope, and side effects from treatment.  He notes no problems with any antihypertensive medication side effects.        Positive major cardiovascular risk factors include male age 21 years old or older, hyperlipidemia, and current tobacco user.  Negative major cardiovascular risk factors include no history of diabetes, no history of hypertension, and negative family history for ischemic heart disease.        Further assessment for target organ damage reveals no history of ASHD, stroke/TIA, or peripheral vascular disease.    Lipid Management History:       Positive NCEP/ATP III risk factors include male age 33 years old or older and current tobacco user.  Negative NCEP/ATP III risk factors include non-diabetic, no family history for ischemic heart disease, non-hypertensive, no ASHD (atherosclerotic heart disease), no prior stroke/TIA, no peripheral vascular disease, and no history of aortic aneurysm.        The patient states that he knows about the "Therapeutic Lifestyle Change" diet.  His compliance with the TLC diet is good.  The patient expresses understanding of adjunctive measures for cholesterol lowering.  Adjunctive measures started by the patient include aerobic exercise, fiber, and omega-3 supplements.  Comments: Notes not exersizing. Has tbeen eating more fruits and veggies. He is due for lipid recheck.    Current Allergies (reviewed today): No known allergies   Past Medical History:    Reviewed history from 05/27/2006 and no changes required:       Allergic rhinitis       Asthma       Depression       GERD  Past Surgical History:    Reviewed history from 05/27/2006 and no changes required:       back surgery   Family History:    Reviewed history from 05/27/2006 and no changes required:       Father: Dead Not sure - possibly liung disase       Mother: Dead Not sure  Siblings: 42 Brothers and 8 Sisters - 3 sets of twins - healthy  Social History:    Reviewed history from 05/27/2006 and no changes required:       Single       Current Smoker       Alcohol use-no       Drug use-yes    Review of Systems  General      Denies chills, fever, and sweats.  Resp      Complains of cough.      Denies shortness of breath, sputum productive, and wheezing.      He has a dry cough. Smoking a pack a day - has done for thirty years. Not ready to quit. Has not been wheezing. He has not used inhalers. He has never had spirometry.   GI      Denies abdominal pain, constipation, diarrhea, nausea, and vomiting.  GU       Denies decreased libido, nocturia, urinary frequency, and urinary hesitancy.   Physical Exam  General:     Well-developed,well-nourished,in no acute distress; alert,appropriate and cooperative throughout examination Lungs:     Normal respiratory effort, chest expands symmetrically. Lungs are clear to auscultation, no crackles or wheezes. Heart:     Normal rate and regular rhythm. S1 and S2 normal without gallop, murmur, click, rub or other extra sounds. Abdomen:     Bowel sounds positive,abdomen soft and non-tender without masses, organomegaly or hernias noted. Extremities:     Stiffness in lower back. SLT negative today. Gait with minimal limp. Psych:     Cognition and judgment appear intact. Alert and cooperative with normal attention span and concentration. No apparent delusions, illusions, hallucinations. Sleepy.    Impression & Recommendations:  Problem # 1:  SYMPTOM, COUGH (ICD-786.2) Discussed. Will obtain CXR given hx of tobacco abuse. Spirometry with graph done in office today given hx of smoking and risk for COPD. Results reviewed and shows severe restrictive pattern. Will refer for full PFTS with DLCO and ABG. Await result and optomize. Orders: T-Chest x-ray, 2 views (71020) Spirometry w/ Graph (94010)   Problem # 2:  TOBACCO ABUSE (ICD-305.1) Councelled cessation. Advised treatment methods and educational class. Not ready for this. Will think about it. Cont to follow. Aware of cancer, COPD and atherosclerotic risk as well as death risk. Discussed how this can worsen his DDD. He is aware . His updated medication list for this problem includes:    Nicorette Starter Kit 4 Mg Gum (Nicotine polacrilex) .Marland Kitchen... As directed with step wise plan  Orders: T-Chest x-ray, 2 views (71020) Spirometry w/ Graph (94010)   Problem # 3:  HYPERLIPIDEMIA (ICD-272.4) Repeat lipids and optomize per ATP III. TLC a must.  Orders: T-Lipid Profile ([id number])  T-Hepatic Function ([id number])   Problem # 4:  LOW BACK PAIN, CHRONIC (ICD-724.2) Await input from Neurosurgery. Councelled back exersize and strengtheing. His updated medication list for this problem includes:    Amrix 30 Mg Cp24 (Cyclobenzaprine hcl) ..... One daily as needed for back pain   Problem # 5:  DEPRESSION (ICD-311) [facility] Note complete reversal of day-night-cycle with patient active after dark. Discussed sleep hygiene. His updated medication list for this problem includes:    Zoloft 50 Mg Tabs (Sertraline hcl) .Marland Kitchen..Marland Kitchen Two times a day   Problem # 6:  Preventive Health Care (ICD-V70.0) Reviewed USPTF guidelines i.e. colonoscopy, vaccines. Wants to hold off until he sees what Neurosurgery says. Recheck 4 weeks and optomize.  Complete Medication List: 1)  Zoloft 50 Mg Tabs (Sertraline hcl) .... Two times a day 2)  Nicorette Starter Kit 4 Mg Gum (Nicotine polacrilex) .... As directed with step wise plan 3)  Medrol (pak) 4 Mg Tabs (Methylprednisolone) .... As directed 4)  Amrix 30 Mg Cp24 (Cyclobenzaprine hcl) .... One daily as needed for back pain  Hypertension Assessment/Plan:      The patient's hypertensive risk group is category B: At least one risk factor (excluding diabetes) with no target organ damage.  His calculated 10 year risk of coronary heart disease is 11 %.  Today's blood pressure is 132/82.  His blood pressure goal is < 140/90.  Lipid Assessment/Plan:      Based on NCEP/ATP III, the patient's risk factor category is "2 or more risk factors and a calculated 10 year CAD risk of < 20%".  From this information, the patient's calculated lipid goals are as follows: Total cholesterol goal is 200; LDL cholesterol goal is 130; HDL cholesterol goal is 40; Triglyceride goal is 150.     Patient Instructions: 1)  Please schedule a follow-up appointment in 1 month.

## 2010-03-26 NOTE — Letter (Signed)
Summary: Test order form for  MRI with contrast  Test order form for  MRI with contrast   Imported By: Pablo Lawrence 09/01/2006 15:09:20  _____________________________________________________________________  External Attachment:    Type:   Image     Comment:   External Document

## 2010-03-26 NOTE — Progress Notes (Signed)
Summary: 07/08/07 [facility] referral  Phone Note Outgoing Call   Call placed by: Sonny Dandy,  Jul 08, 2007 11:53 AM Summary of Call: records sent to [facility] and patient notified of referral Sonny Dandy  Jul 08, 2007 11:53 AM

## 2010-03-26 NOTE — Miscellaneous (Signed)
Summary: Orders Update  Clinical Lists Changes  Orders: Added new Referral order of Neurosurgeon Referral (Neurosurgeon) - Signed 

## 2010-03-26 NOTE — Progress Notes (Signed)
Summary: cough  Phone Note Other Incoming   Call placed by: Magdalene River,  June 15, 2006 1:35 PM Call from: Diane at [facility] Summary of Call: Diane (Mental health case worker) called and states that when they took patient to see the podiatrist this AM, that he was coughing terribly.  Would like for him to be seen. Where should we work him in? Please call Diane [phone] and advise Initial call taken by: Magdalene River,  June 15, 2006 1:36 PM  Follow-up for Phone Call        Spoke with patient, said he is using Halls for his cough and they help. Has not started the Nicorette as yet, but said he will cut down on his smoking.  Advise Follow-up by: Sonny Dandy,  June 15, 2006 3:19 PM  Additional Follow-up for Phone Call Additional follow up Details #1::        Work-in in am. Additional Follow-up by: Franchot Heidelberg MD,  June 15, 2006 3:36 PM   Additional Follow-up for Phone Call Additional follow up Details #2::    spoke with patient and he will come in 06/16/06 Follow-up by: Sonny Dandy,  June 16, 2006 9:18 AM

## 2010-03-26 NOTE — Progress Notes (Signed)
Summary: neuro referral  Phone Note Outgoing Call   Call placed by: Sonny Dandy,  September 01, 2006 4:10 PM Summary of Call: called patient and informed of neuro referral and that [facility] will either call us or him with referral date/time. voices understanding  Initial call taken by: Sonny Dandy,  September 01, 2006 4:11 PM

## 2010-03-26 NOTE — Progress Notes (Signed)
Summary: PFT exam/Neuro appoint  Phone Note Outgoing Call   Call placed by: Sonny Dandy,  November 10, 2006 10:33 AM Summary of Call: Called Respiratory on 11/09/06 and patient did not reschedule or complete his PFT.  Initial call taken by: Sonny Dandy,  November 10, 2006 10:33 AM  Follow-up for Phone Call        Please re-refer. Follow-up by: Franchot Heidelberg MD,  November 10, 2006 10:41 AM  Additional Follow-up for Phone Call Additional follow up Details #1::        Called Diane Smith(pt's [facility] caseworker) on 11/10/06 and again this morning. .................................................................Marland KitchenMarland KitchenSonny Dandy  November 11, 2006 8:34 AM   called, only getting voice mail. Spoke with supervisor and was told she will call back. .................................................................Marland KitchenMarland KitchenSonny Dandy  November 12, 2006 11:46 AM     Additional Follow-up for Phone Call Additional follow up Details #2::    Supervisor returned call and said that patient had appointment on 10/12/06, they did not have an entry stating whether he went or not. I will give the name of the MH caseworker to RT when they reschedule for PFT. I contacted RT and pt is scheduled for 11/19/06 at 930am. MH caseworker, Rennis Chris,  info left for him to make sure patient makes this appointment. Follow-up by: Sonny Dandy,  November 12, 2006 1:18 PM  Additional Follow-up for Phone Call Additional follow up Details #3:: Details for Additional Follow-up Action Taken: Noted. Additional Follow-up by: Franchot Heidelberg MD,  November 12, 2006 3:20 PM

## 2010-03-26 NOTE — Letter (Signed)
Summary: Patient Notice, Colon Biopsy Results  [facility]  [address]   [place], [place] [address]   Phone: [phone]  Fax: [phone]       December 25, 2009   ALEXA GOLEBIEWSKI [address] [place], [place]  [address] 09/08/1953    Dear Mr. Worthing,  I am pleased to inform you that the biopsies taken during your recent colonoscopy did not show any evidence of cancer upon pathologic examination.  Additional information/recommendations:  No further action is needed at this time.  Please follow-up with your primary care physician for your other healthcare needs.  You should have a repeat colonoscopy examination  in 3 years.  Please call us if you are having persistent problems or have questions about your condition that have not been fully answered at this time.  Sincerely,    R. Roetta Sessions MD, FACP [facility] Ph: [phone]    Fax: [phone]   Appended Document: Patient Notice, Colon Biopsy Results letter mailed to pt  Appended Document: Patient Notice, Colon Biopsy Results reminder in computer

## 2010-03-26 NOTE — Miscellaneous (Signed)
Summary: [facility] healthcare PACT certification  [facility] healthcare PACT certification   Imported By: Pablo Lawrence 09/14/2006 08:15:29  _____________________________________________________________________  External Attachment:    Type:   Image     Comment:   External Document

## 2010-03-26 NOTE — Letter (Signed)
Summary: Letter to pt. IE referral notice  Letter to pt. IE referral notice   Imported By: Pablo Lawrence 09/30/2006 15:40:08  _____________________________________________________________________  External Attachment:    Type:   Image     Comment:   External Document

## 2010-03-26 NOTE — Assessment & Plan Note (Signed)
Summary: 2 week follow up/arc   Vital Signs:  Patient Profile:   55 Years Old Male Height:     72 inches Weight:      155 pounds BMI:     21.10 O2 Sat:      99 % Pulse rate:   94 / minute Resp:     12 per minute BP sitting:   140 / 89  Vitals Entered By: Sherilyn Banker (Jul 08, 2007 10:23 AM)                  PCP:  Franchot Heidelberg  Chief Complaint:  follow up visit.  History of Present Illness: Pt in for recheck.  He states he has done well.  He is very pleased with [facility] Services and states have helped him clan home up and he is eating muich better with breakfast and supper. Loves this. States does great and he adds they woke him up to make sure he made to appt. Very appreciative of this.  He states main concern is sleeping. He has been this was for 5 years. States he is just always sleepy. He states he goes to bed early in the day and stays in it all day. He sleeps all day and all night  -states up for two to three hours. He states he uses Zoloft twice a day and has been on this  for five plus years. He states he loves to sleep and states only wakes up when someone knocks on door or phoen rings. He states he has interest in some things and states when awake watches television. He denies irritability. His focus is so-so. Energy is good when awake. No suicidal ideations. Sees Dr. Rudi Heap at end of month. He follows mental health He denies snoring and states will ask aide when comes around. Denies drug use and states never had HIV test. Denies hx of STDs.  Labs from end of April reviewed:   1. CBC 0- INcreased WBC 2. CMP - AST 47 and BS elevated on random specimen.  He now presents.  Lipid Management History:       Positive NCEP/ATP III risk factors include male age 38 years old or older and current tobacco user.  Negative NCEP/ATP III risk factors include non-diabetic, HDL cholesterol greater than 60, no family history for ischemic heart disease, non-hypertensive, no ASHD (atherosclerotic heart disease), no prior stroke/TIA, no peripheral vascular disease, and no history of aortic aneurysm.        The patient states that he knows about the "Therapeutic Lifestyle Change" diet.  His compliance with the TLC diet is good.  The patient expresses understanding of adjunctive measures for cholesterol lowering.  Adjunctive measures started by the patient include aerobic exercise, fiber, ASA, and omega-3 supplements.  Comments: Loves eating. States all he does when awake. Not exersizing.     Prior Medications Reviewed Using: Patient Recall  Updated Prior Medication List: ZOLOFT 50 MG TABS (SERTRALINE HCL) two times a day ADULT ASPIRIN EC LOW STRENGTH 81 MG  TBEC (ASPIRIN) One daily PROVENTIL HFA 108 (90 BASE) MCG/ACT  AERS (ALBUTEROL SULFATE) 2 puffs every 6 hours as needed  Current Allergies (reviewed today): No known allergies   Past Medical History:    Reviewed history from 05/27/2006 and no changes required:       Allergic rhinitis       Asthma       Depression       GERD  Past Surgical History:  Reviewed history from 05/27/2006 and no changes required:       back surgery   Family History:    Reviewed history from 12/21/2006 and no changes required:       Father: Dead Not sure - possibly lung disase       Mother: Dead Not sure       Siblings: 90 Brothers and 8 Sisters - 3 sets of twins - healthy  Social History:    Reviewed history from 05/27/2006 and no changes required:       Single       Current Smoker       Alcohol use-no       Drug use-yes   Risk Factors:  Tobacco use:  current    Year started:  23 year hx    Cigarettes:  Yes -- 2 pack(s) per day     Counseled to quit/cut down tobacco use:  yes Drug use:  yes Alcohol use:  no  Family History Risk Factors:    Family History of MI in females < 79 years old:  no    Family History of MI in males < 55 years old:  no   Review of Systems      See HPI   Physical Exam  General:     Well-developed,well-nourished,in no acute distress; alert,appropriate and cooperative throughout examination. Asleep when I enter the room. ALso falls asleep at end of visit as I eneter orders into computer.  Lungs:     Normal respiratory effort, chest expands symmetrically. Lungs are clear to auscultation, no crackles or wheezes. Heart:     Normal rate and regular rhythm. S1 and S2 normal without gallop, murmur, click, rub or other extra sounds. Abdomen:     Bowel sounds positive,abdomen soft and non-tender without masses, organomegaly or hernias noted. Extremities:     No clubbing, cyanosis, edema, or deformity noted with normal full range of motion of all joints.   Cervical Nodes:     No lymphadenopathy noted Psych:     Cognition and judgment appear intact. Alert and cooperative with normal attention span and concentration. No apparent delusions, illusions, hallucinations    Impression & Recommendations:  Problem # 1:  COPD, MILD (ICD-496) Stable. Rx as below. Avoid all tobacco. Not quite ready to quit. Aware of risk for worsening COPD, cancer, CAD and death. His updated medication list for this problem includes:    Proventil Hfa 108 (90 Base) Mcg/act Aers (Albuterol sulfate) .Marland Kitchen... 2 puffs every 6 hours as needed  Orders: Misc. Referral (Misc. Ref)   Problem # 2:  LIVER FUNCTION TESTS, ABNORMAL (ICD-794.8) Mild elevation AST. Repeat in 4 weeks and if remain, consider furhter eval. Reassures me he does not use drugs or ETOH and he has not used OTC medications. No hx of Hep or HIV.  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 Possibly race variant. Orders:  T-HIV Antibody  (Reflex) ([id number])   Problem # 4:  HYPERLIPIDEMIA (ICD-272.4) TLC. Not open to medications. Advised with malaise and fatigue and constantlyn sleeping unlikley to to lifestyle changes. Aware. Recheck August and if persist, re-address lowering Trig.  Problem # 5:  MALAISE AND FATIGUE (ICD-780.79) Chekc HIV, TSH and get overnight oxymetry. May be sleep distrubance associated with sleep apnea/narcolepsy, vs medication side effect on Zoloft though less likley given long hx. May need to consider testoseterone levels, sleep eval, medication change etc. Await prelim work up and if all fails, refer sleep specialist.  Encoruaged to get more active. Advised avoiding daytime sleeping. Drink one to two cupps of coffeee per day for stimulation. Orders: T-TSH [phone]) Misc. Referral (Misc. Ref) T-HIV Antibody  (Reflex) ([id number])   Problem # 6:  DEPRESSION (ICD-311) See Dr. Duayne Cal as scheduled. Meds as is for now. His updated medication list for this problem includes:    Zoloft 50 Mg Tabs (Sertraline hcl) .Marland Kitchen..Marland Kitchen Two times a day   Problem # 7:  Preventive Health Care (ICD-V70.0) Checl PSA with rectal on return.  Complete Medication List: 1)  Zoloft 50 Mg Tabs (Sertraline hcl) .... Two times a day 2)  Adult Aspirin Ec Low Strength 81 Mg Tbec (Aspirin) .... One daily 3)  Proventil Hfa 108 (90 Base) Mcg/act Aers (Albuterol sulfate) .... 2 puffs every 6 hours as needed  Other Orders: T-PSA  ([id number])  Lipid Assessment/Plan:      Based on NCEP/ATP III, the patient's risk factor category is "0-1 risk factors".  From this information, the patient's calculated lipid goals are as follows: Total cholesterol goal is 200; LDL cholesterol goal is 160; HDL cholesterol goal is 40; Triglyceride goal is 150.     Patient Instructions: 1)  Please schedule a follow-up appointment in 1 month.   ]

## 2010-03-26 NOTE — Progress Notes (Signed)
Summary: 10/05/06 lab results  Phone Note Outgoing Call   Call placed by: Sonny Dandy,  October 07, 2006 3:36 PM Summary of Call: Results given to patient, voices understanding  Initial call taken by: Sonny Dandy,  October 07, 2006 3:36 PM

## 2010-05-08 LAB — GLUCOSE, CAPILLARY: Glucose-Capillary: 126 mg/dL — ABNORMAL HIGH (ref 70–99)

## 2010-07-12 NOTE — Procedures (Signed)
NAMEDRESDEN, LOZITO NO.:  [id number]   MEDICAL RECORD NO.:  [id number]          PATIENT TYPE:  OUT   LOCATION:  RESP                          FACILITY:  [facility]   PHYSICIAN:  Edward L. Juanetta Gosling, M.D.DATE OF BIRTH:  1955-09-27   DATE OF PROCEDURE:  DATE OF DISCHARGE:  11/19/2006                            PULMONARY FUNCTION TEST   FOLLOWUP:  1. Spirometry shows a mild ventilatory defect with evidence of airflow      obstruction.  This is most marked in the smaller airways.  2. Lung volumes are normal.  3. DLCO was normal.  4. There is no significant bronchodilator effect.      Edward L. Juanetta Gosling, M.D.  Electronically Signed     EL[facility]/MEDQ  D:  11/24/2006  T:  11/24/2006  Job:  [id number]

## 2011-03-28 ENCOUNTER — Encounter (HOSPITAL_COMMUNITY): Payer: Self-pay | Admitting: *Deleted

## 2011-03-28 ENCOUNTER — Emergency Department (HOSPITAL_COMMUNITY): Payer: Medicaid Other

## 2011-03-28 ENCOUNTER — Emergency Department (HOSPITAL_COMMUNITY)
Admission: EM | Admit: 2011-03-28 | Discharge: 2011-03-28 | Disposition: A | Discharge status: Home / Self Care | Payer: Medicaid Other | Attending: Emergency Medicine | Admitting: Emergency Medicine

## 2011-03-28 DIAGNOSIS — S0990XA Unspecified injury of head, initial encounter: Secondary | ICD-10-CM

## 2011-03-28 DIAGNOSIS — G319 Degenerative disease of nervous system, unspecified: Secondary | ICD-10-CM | POA: Insufficient documentation

## 2011-03-28 DIAGNOSIS — R5381 Other malaise: Secondary | ICD-10-CM | POA: Insufficient documentation

## 2011-03-28 DIAGNOSIS — S0100XA Unspecified open wound of scalp, initial encounter: Secondary | ICD-10-CM | POA: Insufficient documentation

## 2011-03-28 DIAGNOSIS — S0101XA Laceration without foreign body of scalp, initial encounter: Secondary | ICD-10-CM

## 2011-03-28 DIAGNOSIS — M47812 Spondylosis without myelopathy or radiculopathy, cervical region: Secondary | ICD-10-CM | POA: Insufficient documentation

## 2011-03-28 DIAGNOSIS — Z23 Encounter for immunization: Secondary | ICD-10-CM | POA: Insufficient documentation

## 2011-03-28 MED ORDER — TETANUS-DIPHTHERIA TOXOIDS TD 5-2 LFU IM INJ: 0.5000 mL | INJECTION | Freq: Once | INTRAMUSCULAR | Status: DC

## 2011-03-28 MED ORDER — TETANUS-DIPHTH-ACELL PERTUSSIS 5-2-15.5 LF-MCG/0.5 IM SUSP
0.5000 mL | Freq: Once | INTRAMUSCULAR | Status: AC
  Administered 2011-03-28: 0.5 mL via INTRAMUSCULAR

## 2011-03-28 MED FILL — Tet Tox-Diph-Acell Pertuss Ad Inj 5-2.5-18.5 LF-LF-MCG/0.5ML: INTRAMUSCULAR | Qty: 0.5 | Status: AC
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
## 2011-03-28 NOTE — ED Notes (Signed)
Small laceration noted to the top right side of his head.

## 2011-03-28 NOTE — ED Notes (Signed)
Patient escorted home by [employer].

## 2011-04-07 ENCOUNTER — Emergency Department (HOSPITAL_COMMUNITY)
Admission: EM | Admit: 2011-04-07 | Discharge: 2011-04-07 | Disposition: A | Discharge status: Home / Self Care | Payer: Medicaid Other | Attending: Emergency Medicine | Admitting: Emergency Medicine

## 2011-04-07 ENCOUNTER — Encounter (HOSPITAL_COMMUNITY): Payer: Self-pay

## 2011-04-07 DIAGNOSIS — Z4802 Encounter for removal of sutures: Secondary | ICD-10-CM

## 2011-04-07 NOTE — ED Notes (Signed)
Staples removed per EDP's approval.

## 2011-04-07 NOTE — ED Provider Notes (Signed)
History   This chart was scribed for Celene Kras, MD by Clarita Crane. The patient was seen in room [facility] and the patient's care was started at 1240.   CSN: [id number]  Arrival date & time 04/07/11  1240   First MD Initiated Contact with Patient 04/07/11 1414      Chief Complaint  Patient presents with  . Suture / Staple Removal    (Consider location/radiation/quality/duration/timing/severity/associated sxs/prior treatment) HPI Duane Richardson is a 56 y.o. male who presents to the Emergency Department to have staples removed from scalp which were placed 10 days ago in ED after sustaining laceration. Patient denies drainage, HA.   History reviewed. No pertinent past medical history.  History reviewed. No pertinent past surgical history.  No family history on file.  History  Substance Use Topics  . Smoking status: Not on file  . Smokeless tobacco: Not on file  . Alcohol Use: Yes      Review of Systems 10 Systems reviewed and are negative for acute change except as noted in the HPI.  Allergies  Review of patient's allergies indicates no known allergies.  Home Medications   Current Outpatient Rx  Name Route Sig Dispense Refill  . SERTRALINE HCL 100 MG PO TABS Oral Take 100 mg by mouth 2 (two) times daily.      BP 147/91  Pulse 100  Temp(Src) 98.4 F (36.9 C) (Oral)  Resp 20  Ht 6\' 1"  (1.854 m)  Wt 160 lb (72.576 kg)  BMI 21.11 kg/m2  SpO2 98%  Physical Exam  Nursing note and vitals reviewed. Constitutional: He appears well-developed and well-nourished. No distress.  HENT:  Head: Normocephalic and atraumatic.  Right Ear: External ear normal.  Left Ear: External ear normal.       Small well-healing wound to scalp with no swelling or erythema noted.   Eyes: Conjunctivae are normal. Right eye exhibits no discharge. Left eye exhibits no discharge. No scleral icterus.  Neck: Neck supple. No tracheal deviation present.  Cardiovascular: Normal rate.     Pulmonary/Chest: Effort normal. No stridor. No respiratory distress.  Musculoskeletal: He exhibits no edema.  Neurological: He is alert. Cranial nerve deficit: no gross deficits.  Skin: Skin is warm and dry. No rash noted.  Psychiatric: He has a normal mood and affect.    ED Course  Procedures (including critical care time)  DIAGNOSTIC STUDIES: Oxygen Saturation is 98% on room air, normal by my interpretation.    COORDINATION OF CARE: 2:15PM- Staples removed from patient's scalp by nurse.    Labs Reviewed - No data to display No results found.   1. Encounter for staple removal       MDM  No sign of infection.  Staples removed by nursing staff.     I personally performed the services described in this documentation, which was scribed in my presence.  The recorded information has been reviewed and considered.    Celene Kras, MD 04/08/11 [phone]

## 2011-04-07 NOTE — ED Notes (Signed)
Pt here for staple removal from head. Denies any problems.

## 2011-05-07 ENCOUNTER — Encounter (HOSPITAL_COMMUNITY): Payer: Self-pay | Admitting: *Deleted

## 2011-05-07 ENCOUNTER — Emergency Department (HOSPITAL_COMMUNITY)
Admission: EM | Admit: 2011-05-07 | Discharge: 2011-05-07 | Disposition: A | Discharge status: Home / Self Care | Payer: Medicaid Other | Attending: Emergency Medicine | Admitting: Emergency Medicine

## 2011-05-07 DIAGNOSIS — M25519 Pain in unspecified shoulder: Secondary | ICD-10-CM | POA: Insufficient documentation

## 2011-05-07 DIAGNOSIS — M25512 Pain in left shoulder: Secondary | ICD-10-CM

## 2011-05-07 DIAGNOSIS — F17200 Nicotine dependence, unspecified, uncomplicated: Secondary | ICD-10-CM | POA: Insufficient documentation

## 2011-05-07 HISTORY — DX: Depression, unspecified: F32.A

## 2011-05-07 HISTORY — DX: Major depressive disorder, single episode, unspecified: F32.9

## 2011-05-07 MED ORDER — IBUPROFEN 600 MG PO TABS: 600.0000 mg | ORAL_TABLET | Freq: Three times a day (TID) | ORAL | Status: AC

## 2011-05-07 MED ORDER — HYDROCODONE-ACETAMINOPHEN 5-325 MG PO TABS
2.0000 | ORAL_TABLET | Freq: Once | ORAL | Status: AC
  Administered 2011-05-07: 2 via ORAL
  Filled 2011-05-07: qty 2

## 2011-05-07 MED ORDER — KETOROLAC TROMETHAMINE 30 MG/ML IJ SOLN
30.0000 mg | Freq: Once | INTRAMUSCULAR | Status: AC
  Administered 2011-05-07: 30 mg via INTRAMUSCULAR
  Filled 2011-05-07: qty 1

## 2011-05-07 MED ORDER — HYDROCODONE-ACETAMINOPHEN 5-325 MG PO TABS: ORAL_TABLET | ORAL | Status: DC

## 2011-05-07 NOTE — Discharge Instructions (Signed)
Arthralgia Your caregiver has diagnosed you as suffering from an arthralgia. Arthralgia means there is pain in a joint. This can come from many reasons including:  Bruising the joint which causes soreness (inflammation) in the joint.   Wear and tear on the joints which occur as we grow older (osteoarthritis).   Overusing the joint.   Various forms of arthritis.   Infections of the joint.  Regardless of the cause of pain in your joint, most of these different pains respond to anti-inflammatory drugs and rest. The exception to this is when a joint is infected, and these cases are treated with antibiotics, if it is a bacterial infection. HOME CARE INSTRUCTIONS   Rest the injured area for as long as directed by your caregiver. Then slowly start using the joint as directed by your caregiver and as the pain allows. Crutches as directed may be useful if the ankles, knees or hips are involved. If the knee was splinted or casted, continue use and care as directed. If an stretchy or elastic wrapping bandage has been applied today, it should be removed and re-applied every 3 to 4 hours. It should not be applied tightly, but firmly enough to keep swelling down. Watch toes and feet for swelling, bluish discoloration, coldness, numbness or excessive pain. If any of these problems (symptoms) occur, remove the ace bandage and re-apply more loosely. If these symptoms persist, contact your caregiver or return to this location.   For the first 24 hours, keep the injured extremity elevated on pillows while lying down.   Apply ice for 15 to 20 minutes to the sore joint every couple hours while awake for the first half day. Then 3 to 4 times per day for the first 48 hours. Put the ice in a plastic bag and place a towel between the bag of ice and your skin.   Wear any splinting, casting, elastic bandage applications, or slings as instructed.   Only take over-the-counter or prescription medicines for pain,  discomfort, or fever as directed by your caregiver. Do not use aspirin immediately after the injury unless instructed by your physician. Aspirin can cause increased bleeding and bruising of the tissues.   If you were given crutches, continue to use them as instructed and do not resume weight bearing on the sore joint until instructed.  Persistent pain and inability to use the sore joint as directed for more than 2 to 3 days are warning signs indicating that you should see a caregiver for a follow-up visit as soon as possible. Initially, a hairline fracture (break in bone) may not be evident on X-rays. Persistent pain and swelling indicate that further evaluation, non-weight bearing or use of the joint (use of crutches or slings as instructed), or further X-rays are indicated. X-rays may sometimes not show a small fracture until a week or 10 days later. Make a follow-up appointment with your own caregiver or one to whom we have referred you. A radiologist (specialist in reading X-rays) may read your X-rays. Make sure you know how you are to obtain your X-ray results. Do not assume everything is normal if you do not hear from us. SEEK MEDICAL CARE IF: Bruising, swelling, or pain increases. SEEK IMMEDIATE MEDICAL CARE IF:   Your fingers or toes are numb or blue.   The pain is not responding to medications and continues to stay the same or get worse.   The pain in your joint becomes severe.   You develop a fever over   102 F (38.9 C).   It becomes impossible to move or use the joint.  MAKE SURE YOU:   Understand these instructions.   Will watch your condition.   Will get help right away if you are not doing well or get worse.  Document Released: 02/10/2005 Document Revised: 01/30/2011 Document Reviewed: 09/29/2007 [facility] Patient Information 2012 [place], [place].      Take the ibuprofen as directed with food for the next 5 days. Norco can be taken as needed as instructed.  Narcotic and  benzodiazepine use may cause drowsiness, slowed breathing or dependence.  Please use with caution and do not drive, operate machinery or watch young children alone while taking them.  Taking combinations of these medications or drinking alcohol will potentiate these effects.  Use cling for comfort, you may take it off when needed.  Do not rely on the sling for more than 3 days, continue to perform range of motion exercises to prevent a more stiff and frozen shoulder.  Follow up with your own doctor next week if shoulder pain is not improving.

## 2011-05-07 NOTE — ED Provider Notes (Signed)
History   This chart was scribed for Duane Richardson. Oletta Lamas, MD by Clarita Crane. The patient was seen in room APA03/APA03. Patient's care was started at 0711.    CSN: [id number]  Arrival date & time 05/07/11  0711   First MD Initiated Contact with Patient 05/07/11 [phone]      Chief Complaint  Patient presents with  . Shoulder Pain    (Consider location/radiation/quality/duration/timing/severity/associated sxs/prior treatment) HPI Comments: Pain in left shoulder does not radiate to chest.  English Duane Richardson is a 56 y.o. male who presents to the Emergency Department complaining of constant moderate left shoulder pain onset several days ago and worsening since. Patient states pain is aggravated by coughing and movement of left shoulder. Denies productive cough, fever, chills, numbness, tingling, chest pain, SOB and previous history of similar. Patient is left hand dominant. Patient with h/o back surgery.   Past Medical History  Diagnosis Date  . Depression     Past Surgical History  Procedure Date  . Back surgery     History reviewed. No pertinent family history.  History  Substance Use Topics  . Smoking status: Current Everyday Smoker    Types: Cigarettes  . Smokeless tobacco: Not on file  . Alcohol Use: No      Review of Systems  Constitutional: Negative for fever and chills.  HENT: Negative for neck pain.   Respiratory: Negative for cough and shortness of breath.   Cardiovascular: Negative for chest pain.  Musculoskeletal: Positive for arthralgias. Negative for back pain.       +Left shoulder pain.   Neurological: Negative for numbness.    Allergies  Review of patient's allergies indicates no known allergies.  Home Medications   Current Outpatient Rx  Name Route Sig Dispense Refill  . HYDROCODONE-ACETAMINOPHEN 5-325 MG PO TABS  1-2 tablets po q 6 hours prn moderate to severe pain 20 tablet 0  . IBUPROFEN 600 MG PO TABS Oral Take 1 tablet (600 mg total) by mouth 3 x  daily with food. 15 tablet 0  . SERTRALINE HCL 100 MG PO TABS Oral Take 100 mg by mouth 2 (two) times daily.      BP 145/99  Pulse 112  Temp(Src) 97.8 F (36.6 C) (Oral)  Resp 16  Ht 6\' 1"  (1.854 m)  Wt 150 lb (68.04 kg)  BMI 19.79 kg/m2  SpO2 99%  Physical Exam  Nursing note and vitals reviewed. Constitutional: He is oriented to person, place, and time. He appears well-developed and well-nourished. No distress.  HENT:  Head: Normocephalic and atraumatic.  Eyes: EOM are normal. Pupils are equal, round, and reactive to light.  Neck: Neck supple. No tracheal deviation present.  Cardiovascular: Normal rate and regular rhythm.  Exam reveals no gallop and no friction rub.   No murmur heard. Pulmonary/Chest: Effort normal. No respiratory distress. He has no wheezes. He has no rales.  Abdominal: Soft. He exhibits no distension. There is no tenderness.  Musculoskeletal: Normal range of motion. He exhibits tenderness. He exhibits no edema.       Left side trapezius tender to palpation. Left Rhomboid muscle tender to palpation. FROM of left shoulder, elbow, wrist and fingers.   Neurological: He is alert and oriented to person, place, and time. No sensory deficit.       Sensation intact. Median nerve function and sensation intact.   Skin: Skin is warm and dry.  Psychiatric: He has a normal mood and affect. His behavior is normal.  ED Course  Procedures (including critical care time)  DIAGNOSTIC STUDIES: Oxygen Saturation is 99% on room air, normal by my interpretation.    COORDINATION OF CARE: 7:40AM- Patient informed of current plan for treatment and evaluation and agrees with plan at this time.     Labs Reviewed - No data to display No results found.   1. Shoulder pain, left       MDM  Pt's exam is consistent with musculoskeletal discomfort on exam.  Lungs clear, no fever.  Pt denies specific injury, but could be chronic rotator cuff syndrome.  RICE, sling for comfort,  follow up with PCP next week if symptoms are not improving.        I personally performed the services described in this documentation, which was scribed in my presence. The recorded information has been reviewed and considered.    Duane Richardson. Oletta Lamas, MD 05/07/11 [phone]

## 2011-05-07 NOTE — ED Notes (Signed)
Pt c/o pain in his left shoulder since yesterday. Pt states that it is worse with movement or coughing. Denies injury.

## 2011-11-03 ENCOUNTER — Emergency Department (HOSPITAL_COMMUNITY)
Admission: EM | Admit: 2011-11-03 | Discharge: 2011-11-03 | Disposition: A | Discharge status: Home / Self Care | Payer: Medicaid Other | Attending: Emergency Medicine | Admitting: Emergency Medicine

## 2011-11-03 ENCOUNTER — Emergency Department (HOSPITAL_COMMUNITY): Payer: Medicaid Other

## 2011-11-03 ENCOUNTER — Encounter (HOSPITAL_COMMUNITY): Payer: Self-pay | Admitting: *Deleted

## 2011-11-03 DIAGNOSIS — J189 Pneumonia, unspecified organism: Secondary | ICD-10-CM

## 2011-11-03 DIAGNOSIS — F17200 Nicotine dependence, unspecified, uncomplicated: Secondary | ICD-10-CM | POA: Insufficient documentation

## 2011-11-03 DIAGNOSIS — R079 Chest pain, unspecified: Secondary | ICD-10-CM | POA: Insufficient documentation

## 2011-11-03 LAB — CBC WITH DIFFERENTIAL/PLATELET
Basophils Absolute: 0 10*3/uL (ref 0.0–0.1)
Basophils Relative: 0 % (ref 0–1)
Eosinophils Absolute: 0 10*3/uL (ref 0.0–0.7)
Eosinophils Relative: 0 % (ref 0–5)
HCT: 40.5 % (ref 39.0–52.0)
Hemoglobin: 13.8 g/dL (ref 13.0–17.0)
Lymphocytes Relative: 6 % — ABNORMAL LOW (ref 12–46)
Lymphs Abs: 0.4 10*3/uL — ABNORMAL LOW (ref 0.7–4.0)
MCH: 31.1 pg (ref 26.0–34.0)
MCHC: 34.1 g/dL (ref 30.0–36.0)
MCV: 91.2 fL (ref 78.0–100.0)
Monocytes Absolute: 0.8 10*3/uL (ref 0.1–1.0)
Monocytes Relative: 11 % (ref 3–12)
Neutro Abs: 6.2 10*3/uL (ref 1.7–7.7)
Neutrophils Relative %: 83 % — ABNORMAL HIGH (ref 43–77)
Platelets: 92 10*3/uL — ABNORMAL LOW (ref 150–400)
RBC: 4.44 MIL/uL (ref 4.22–5.81)
RDW: 13.7 % (ref 11.5–15.5)
WBC Morphology: INCREASED
WBC: 7.4 10*3/uL (ref 4.0–10.5)

## 2011-11-03 LAB — BASIC METABOLIC PANEL
BUN: 4 mg/dL — ABNORMAL LOW (ref 6–23)
CO2: 19 mEq/L (ref 19–32)
Calcium: 8.6 mg/dL (ref 8.4–10.5)
Chloride: 100 mEq/L (ref 96–112)
Creatinine, Ser: 0.68 mg/dL (ref 0.50–1.35)
GFR calc Af Amer: >90 mL/min (ref >90)
GFR calc non Af Amer: >90 mL/min (ref >90)
Glucose, Bld: 133 mg/dL — ABNORMAL HIGH (ref 70–99)
Potassium: 4.1 mEq/L (ref 3.5–5.1)
Sodium: 137 mEq/L (ref 135–145)

## 2011-11-03 MED ORDER — AZITHROMYCIN 250 MG PO TABS: 250.0000 mg | ORAL_TABLET | Freq: Every day | ORAL | Status: AC

## 2011-11-03 MED ORDER — OXYCODONE-ACETAMINOPHEN 5-325 MG PO TABS
2.0000 | ORAL_TABLET | Freq: Once | ORAL | Status: AC
  Administered 2011-11-03: 2 via ORAL
  Filled 2011-11-03: qty 2

## 2011-11-03 MED ORDER — IBUPROFEN 800 MG PO TABS
800.0000 mg | ORAL_TABLET | Freq: Once | ORAL | Status: AC
  Administered 2011-11-03: 800 mg via ORAL
  Filled 2011-11-03: qty 1

## 2011-11-03 MED ORDER — AZITHROMYCIN 250 MG PO TABS
500.0000 mg | ORAL_TABLET | Freq: Once | ORAL | Status: AC
  Administered 2011-11-03: 500 mg via ORAL
  Filled 2011-11-03: qty 2

## 2011-11-03 MED ORDER — OXYCODONE-ACETAMINOPHEN 5-325 MG PO TABS: 1.0000 | ORAL_TABLET | Freq: Four times a day (QID) | ORAL | Status: AC | PRN

## 2011-11-03 NOTE — ED Notes (Addendum)
Pain rt lower antero-lat chest,  Cough, causes increase in pain. Pt shakey, stopped drinking etoh 2 weeks ago.Incontinent of urine.

## 2011-11-03 NOTE — ED Notes (Signed)
Pt has noted tremors; hands shaky with movement

## 2011-11-03 NOTE — ED Notes (Signed)
Advised patient that I would assist in getting his clothes on.  Patient advised me he did not need assistance.  Patient's pants and shirt plus shoes were placed near stretcher and patient advised if he needed help he would ring the call bell.

## 2011-11-03 NOTE — ED Notes (Signed)
Patient was asleep with no tremors noted.  When I awoke patient and advised that I needed to get his vital signs he started shaking again.  Patient advised that he always "shaked like this".  While obtaining vital signs patient did not have any tremors, but started back immediately after taking b/p cuff off.

## 2011-11-03 NOTE — ED Provider Notes (Signed)
History     CSN: [id number]  Arrival date & time 11/03/11  1815   First MD Initiated Contact with Patient 11/03/11 1842      Chief Complaint  Patient presents with  . Chest Pain    (Consider location/radiation/quality/duration/timing/severity/associated sxs/prior treatment) HPI Pt reports several days of severe aching R lower chest wall pain, worse with deep breath and cough. No SOB or fever. Also states he has had a tremor since he stopped drinking 3 weeks ago. His tremor comes and goes, no hallucinations.   Past Medical History  Diagnosis Date  . Depression     Past Surgical History  Procedure Date  . Back surgery     History reviewed. No pertinent family history.  History  Substance Use Topics  . Smoking status: Current Everyday Smoker    Types: Cigarettes  . Smokeless tobacco: Not on file  . Alcohol Use: No     quit drinking 3 weeks ago      Review of Systems All other systems reviewed and are negative except as noted in HPI.   Allergies  Review of patient's allergies indicates no known allergies.  Home Medications   Current Outpatient Rx  Name Route Sig Dispense Refill  . HYDROCODONE-ACETAMINOPHEN 5-325 MG PO TABS  1-2 tablets po q 6 hours prn moderate to severe pain 20 tablet 0  . SERTRALINE HCL 100 MG PO TABS Oral Take 100 mg by mouth 2 (two) times daily.      BP 110/71  Pulse 117  Temp 98.2 F (36.8 C) (Oral)  Resp 20  Ht 6' (1.829 m)  Wt 140 lb (63.504 kg)  BMI 18.99 kg/m2  SpO2 96%  Physical Exam  Nursing note and vitals reviewed. Constitutional: He is oriented to person, place, and time. He appears well-developed and well-nourished.  HENT:  Head: Normocephalic and atraumatic.  Eyes: EOM are normal. Pupils are equal, round, and reactive to light.  Neck: Normal range of motion. Neck supple.  Cardiovascular: Normal rate, normal heart sounds and intact distal pulses.   Pulmonary/Chest: Effort normal and breath sounds normal.  Abdominal:  Bowel sounds are normal. He exhibits no distension. There is no tenderness.  Musculoskeletal: Normal range of motion. He exhibits no edema and no tenderness.  Neurological: He is alert and oriented to person, place, and time. He has normal strength. No cranial nerve deficit or sensory deficit.       Coarse tremor disappears when distracted and not there when I first walked into room  Skin: Skin is warm and dry. No rash noted.  Psychiatric: He has a normal mood and affect.    ED Course  Procedures (including critical care time)  Labs Reviewed  CBC WITH DIFFERENTIAL - Abnormal; Notable for the following:    Platelets 92 (*)     Neutrophils Relative 83 (*)     Lymphocytes Relative 6 (*)     Lymphs Abs 0.4 (*)     All other components within normal limits  BASIC METABOLIC PANEL - Abnormal; Notable for the following:    Glucose, Bld 133 (*)     BUN 4 (*)     All other components within normal limits   Dg Chest 2 View  11/03/2011  *RADIOLOGY REPORT*  Clinical Data: Chest pain.  Smoker with history of COPD.  CHEST - 2 VIEW  Comparison: Two-view chest x-ray 06/29/2007, 10/05/2006.  Findings: Patchy opacities in the left lower lobe.  Lungs otherwise clear.  Mild hyperinflation and emphysematous  changes in the upper lobes, unchanged.  No pleural effusions. Cardiomediastinal silhouette unremarkable, unchanged.  Healing/healed right posterior sixth rib fracture.  Mild degenerative changes involving the thoracic spine.  IMPRESSION: Left lower lobe bronchopneumonia suspected, superimposed upon COPD/emphysema.   Original Report Authenticated By: Arnell Sieving, M.D.      No diagnosis found.    MDM   Date: 11/03/2011  Rate: 106  Rhythm: sinus tachy with artifact  QRS Axis: normal  Intervals: difficult to assess due to artifact  ST/T Wave abnormalities: indeterminate  Conduction Disutrbances:artifact  Narrative Interpretation:   Old EKG Reviewed: none available   8:43 PM CXR above  shows PNA. Will start Zithromycin in the ED, advised close PCP followup for a recheck. No fever or hypoxia in the ED. No indication for admission at this time.        Charles B. Bernette Mayers, MD 11/03/11 2044

## 2011-11-03 NOTE — ED Notes (Signed)
Pt stable at discharge pt instructed to finish all antibotics and not to drive or take extra tlyenol with pain meds Pt verbalizes understanding

## 2011-11-06 ENCOUNTER — Emergency Department (HOSPITAL_COMMUNITY): Payer: Medicaid Other

## 2011-11-06 ENCOUNTER — Encounter (HOSPITAL_COMMUNITY): Payer: Self-pay

## 2011-11-06 ENCOUNTER — Emergency Department (HOSPITAL_COMMUNITY)
Admission: EM | Admit: 2011-11-06 | Discharge: 2011-11-06 | Disposition: A | Discharge status: Home / Self Care | Payer: Medicaid Other | Attending: Emergency Medicine | Admitting: Emergency Medicine

## 2011-11-06 DIAGNOSIS — R55 Syncope and collapse: Secondary | ICD-10-CM | POA: Insufficient documentation

## 2011-11-06 DIAGNOSIS — E876 Hypokalemia: Secondary | ICD-10-CM

## 2011-11-06 DIAGNOSIS — R259 Unspecified abnormal involuntary movements: Secondary | ICD-10-CM | POA: Insufficient documentation

## 2011-11-06 LAB — URINE MICROSCOPIC-ADD ON

## 2011-11-06 LAB — RAPID URINE DRUG SCREEN, HOSP PERFORMED
Amphetamines: NOT DETECTED
Barbiturates: NOT DETECTED
Benzodiazepines: NOT DETECTED
Cocaine: NOT DETECTED
Opiates: NOT DETECTED
Tetrahydrocannabinol: NOT DETECTED

## 2011-11-06 LAB — CBC WITH DIFFERENTIAL/PLATELET
Basophils Absolute: 0 10*3/uL (ref 0.0–0.1)
Basophils Relative: 1 % (ref 0–1)
Eosinophils Absolute: 0 10*3/uL (ref 0.0–0.7)
Eosinophils Relative: 0 % (ref 0–5)
HCT: 38.8 % — ABNORMAL LOW (ref 39.0–52.0)
Hemoglobin: 13.5 g/dL (ref 13.0–17.0)
Lymphocytes Relative: 20 % (ref 12–46)
Lymphs Abs: 0.8 10*3/uL (ref 0.7–4.0)
MCH: 31.7 pg (ref 26.0–34.0)
MCHC: 34.8 g/dL (ref 30.0–36.0)
MCV: 91.1 fL (ref 78.0–100.0)
Monocytes Absolute: 0.6 10*3/uL (ref 0.1–1.0)
Monocytes Relative: 15 % — ABNORMAL HIGH (ref 3–12)
Neutro Abs: 2.8 10*3/uL (ref 1.7–7.7)
Neutrophils Relative %: 65 % (ref 43–77)
Platelets: 70 10*3/uL — ABNORMAL LOW (ref 150–400)
RBC: 4.26 MIL/uL (ref 4.22–5.81)
RDW: 13.1 % (ref 11.5–15.5)
WBC: 4.3 10*3/uL (ref 4.0–10.5)

## 2011-11-06 LAB — URINALYSIS, ROUTINE W REFLEX MICROSCOPIC
Glucose, UA: NEGATIVE mg/dL
Hgb urine dipstick: NEGATIVE
Ketones, ur: NEGATIVE mg/dL
Nitrite: NEGATIVE
Protein, ur: 30 mg/dL — AB
Specific Gravity, Urine: 1.015 (ref 1.005–1.030)
Urobilinogen, UA: 1 mg/dL (ref 0.0–1.0)
pH: 6 (ref 5.0–8.0)

## 2011-11-06 LAB — BASIC METABOLIC PANEL
BUN: 7 mg/dL (ref 6–23)
CO2: 22 mEq/L (ref 19–32)
Calcium: 8.4 mg/dL (ref 8.4–10.5)
Chloride: 97 mEq/L (ref 96–112)
Creatinine, Ser: 0.77 mg/dL (ref 0.50–1.35)
GFR calc Af Amer: >90 mL/min (ref >90)
GFR calc non Af Amer: >90 mL/min (ref >90)
Glucose, Bld: 97 mg/dL (ref 70–99)
Potassium: 2.7 mEq/L — CL (ref 3.5–5.1)
Sodium: 130 mEq/L — ABNORMAL LOW (ref 135–145)

## 2011-11-06 LAB — GLUCOSE, CAPILLARY: Glucose-Capillary: 88 mg/dL (ref 70–99)

## 2011-11-06 LAB — POTASSIUM: Potassium: 3.6 mEq/L (ref 3.5–5.1)

## 2011-11-06 LAB — ETHANOL: Alcohol, Ethyl (B): <11 mg/dL (ref 0–11)

## 2011-11-06 MED ORDER — SODIUM CHLORIDE 0.9 % IV SOLN
INTRAVENOUS | Status: DC
  Administered 2011-11-06: 14:00:00 via INTRAVENOUS

## 2011-11-06 MED ORDER — POTASSIUM CHLORIDE CRYS ER 20 MEQ PO TBCR
40.0000 meq | EXTENDED_RELEASE_TABLET | Freq: Once | ORAL | Status: AC
  Administered 2011-11-06: 40 meq via ORAL
  Filled 2011-11-06: qty 2

## 2011-11-06 MED ORDER — DIPHENHYDRAMINE HCL 50 MG/ML IJ SOLN
25.0000 mg | Freq: Once | INTRAMUSCULAR | Status: AC
  Administered 2011-11-06: 25 mg via INTRAVENOUS
  Filled 2011-11-06: qty 1

## 2011-11-06 MED ORDER — POTASSIUM CHLORIDE 10 MEQ/100ML IV SOLN
10.0000 meq | INTRAVENOUS | Status: AC
  Administered 2011-11-06 (×3): 10 meq via INTRAVENOUS
  Filled 2011-11-06 (×3): qty 100

## 2011-11-06 NOTE — ED Notes (Signed)
Pt arrived by ems, was found in yard, --had been mowing, unknown how long he was in the yard, pt had ?seizure.   Pt stated to ems that " i think i'm going to have another seizure", confused at ems arrival, follows commands, alert at present. ivf's infusing.

## 2011-11-06 NOTE — ED Notes (Signed)
CRITICAL VALUE ALERT  Critical value received:  Potassium 2.7  Date of notification:  11/06/2011  Time of notification: 1441  Critical value read back:yes  Nurse who received alert:  Tarri Glenn RN  MD notified (1st page):  Dr Effie Shy  Time of first page:  1442  MD notified (2nd page):  Time of second page:  Responding MD:  Dr Effie Shy  Time MD responded:  [phone]

## 2011-11-06 NOTE — ED Notes (Signed)
Sprite given per request/fluid challenge.

## 2011-11-06 NOTE — ED Provider Notes (Signed)
History   This chart was scribed for Flint Melter, MD by Sofie Rower. The patient was seen in room APA02/APA02 and the patient's care was started at 1:24PM    CSN: [id number]  Arrival date & time 11/06/11  1311   First MD Initiated Contact with Patient 11/06/11 1324      Chief Complaint  Patient presents with  . Seizures    (Consider location/radiation/quality/duration/timing/severity/associated sxs/prior treatment) Patient is a 56 y.o. male presenting with seizures. The history is provided by the patient and the EMS personnel. No language interpreter was used.  Seizures    Duane Richardson is a 56 y.o. male , with a hx of seizures, who presents to the Emergency Department complaining of sudden, progressively worsening, seizure, onset today with associated symptoms of dizziness and tremors. The pt reports Duane Richardson had been outside, working in the yard, where Duane Richardson suddenly began to feel a dizziness sensation and experienced a seizure. The pt has a hx of depression, back surgery, and seizure episodes.  The pt is a current everyday smoker, however, Duane Richardson does not drink alcohol.   PCP is Dr. Jomarie Longs.    Past Medical History  Diagnosis Date  . Depression   . Seizures     Past Surgical History  Procedure Date  . Back surgery     No family history on file.  History  Substance Use Topics  . Smoking status: Current Every Day Smoker    Types: Cigarettes  . Smokeless tobacco: Not on file  . Alcohol Use: No     quit drinking 3 weeks ago      Review of Systems  Neurological: Positive for seizures.  All other systems reviewed and are negative.    Allergies  Review of patient's allergies indicates no known allergies.  Home Medications   Current Outpatient Rx  Name Route Sig Dispense Refill  . AZITHROMYCIN 250 MG PO TABS Oral Take 1 tablet (250 mg total) by mouth daily. 4 tablet 0  . OXYCODONE-ACETAMINOPHEN 5-325 MG PO TABS Oral Take 1-2 tablets by mouth every 6 (six) hours as  needed for pain. 20 tablet 0  . SERTRALINE HCL 100 MG PO TABS Oral Take 100 mg by mouth 2 (two) times daily.      BP 141/98  Pulse 96  Resp 20  SpO2 98%  Physical Exam  Nursing note and vitals reviewed. Constitutional: Duane Richardson is oriented to person, place, and time. Duane Richardson appears well-developed and well-nourished.  HENT:  Head: Normocephalic and atraumatic.  Right Ear: External ear normal.  Left Ear: External ear normal.       No bite marks detected on tongue.   Eyes: Conjunctivae normal and EOM are normal. Pupils are equal, round, and reactive to light.  Neck: Normal range of motion and phonation normal. Neck supple.  Cardiovascular: Normal rate, regular rhythm, normal heart sounds and intact distal pulses.   Pulmonary/Chest: Effort normal and breath sounds normal. Duane Richardson exhibits no bony tenderness.  Abdominal: Soft. Normal appearance. There is no tenderness.  Musculoskeletal: Normal range of motion.  Neurological: Duane Richardson is alert and oriented to person, place, and time. Duane Richardson has normal strength. Duane Richardson displays tremor (Mild). No cranial nerve deficit or sensory deficit. Duane Richardson exhibits normal muscle tone.       Mild tremor detected.   Skin: Skin is warm, dry and intact.  Psychiatric: Duane Richardson has a normal mood and affect. His behavior is normal. Judgment and thought content normal.    ED Course  Procedures (  including critical care time)  DIAGNOSTIC STUDIES: Oxygen Saturation is 98% on room air, normal by my interpretation.    COORDINATION OF CARE:   Date: 11/06/2011  Rate: 90  Rhythm: normal sinus rhythm  QRS Axis: normal  Intervals: QT prolonged  ST/T Wave abnormalities: normal  Conduction Disutrbances:none  Narrative Interpretation:   Old EKG Reviewed: unchanged   1:48PM- Treatment plan discussed with patient. Pt agrees with treatment.    18:55- patient is alert, calm, and wants to leave. Duane Richardson is tolerating food and fluid in the ED. His repeat vital signs are reassuring     Results for  orders placed during the hospital encounter of 11/06/11  BASIC METABOLIC PANEL      Component Value Range   Sodium 130 (*) 135 - 145 mEq/L   Potassium 2.7 (*) 3.5 - 5.1 mEq/L   Chloride 97  96 - 112 mEq/L   CO2 22  19 - 32 mEq/L   Glucose, Bld 97  70 - 99 mg/dL   BUN 7  6 - 23 mg/dL   Creatinine, Ser 1.61  0.50 - 1.35 mg/dL   Calcium 8.4  8.4 - 09.6 mg/dL   GFR calc non Af Amer >90  >90 mL/min   GFR calc Af Amer >90  >90 mL/min  URINE RAPID DRUG SCREEN (HOSP PERFORMED)      Component Value Range   Opiates NONE DETECTED  NONE DETECTED   Cocaine NONE DETECTED  NONE DETECTED   Benzodiazepines NONE DETECTED  NONE DETECTED   Amphetamines NONE DETECTED  NONE DETECTED   Tetrahydrocannabinol NONE DETECTED  NONE DETECTED   Barbiturates NONE DETECTED  NONE DETECTED  ETHANOL      Component Value Range   Alcohol, Ethyl (B) <11  0 - 11 mg/dL  CBC WITH DIFFERENTIAL      Component Value Range   WBC 4.3  4.0 - 10.5 K/uL   RBC 4.26  4.22 - 5.81 MIL/uL   Hemoglobin 13.5  13.0 - 17.0 g/dL   HCT 04.5 (*) 40.9 - 81.1 %   MCV 91.1  78.0 - 100.0 fL   MCH 31.7  26.0 - 34.0 pg   MCHC 34.8  30.0 - 36.0 g/dL   RDW 91.4  78.2 - 95.6 %   Platelets 70 (*) 150 - 400 K/uL   Neutrophils Relative 65  43 - 77 %   Neutro Abs 2.8  1.7 - 7.7 K/uL   Lymphocytes Relative 20  12 - 46 %   Lymphs Abs 0.8  0.7 - 4.0 K/uL   Monocytes Relative 15 (*) 3 - 12 %   Monocytes Absolute 0.6  0.1 - 1.0 K/uL   Eosinophils Relative 0  0 - 5 %   Eosinophils Absolute 0.0  0.0 - 0.7 K/uL   Basophils Relative 1  0 - 1 %   Basophils Absolute 0.0  0.0 - 0.1 K/uL  URINALYSIS, ROUTINE W REFLEX MICROSCOPIC      Component Value Range   Color, Urine YELLOW  YELLOW   APPearance CLEAR  CLEAR   Specific Gravity, Urine 1.015  1.005 - 1.030   pH 6.0  5.0 - 8.0   Glucose, UA NEGATIVE  NEGATIVE mg/dL   Hgb urine dipstick NEGATIVE  NEGATIVE   Bilirubin Urine SMALL (*) NEGATIVE   Ketones, ur NEGATIVE  NEGATIVE mg/dL   Protein, ur 30 (*)  NEGATIVE mg/dL   Urobilinogen, UA 1.0  0.0 - 1.0 mg/dL   Nitrite NEGATIVE  NEGATIVE  Leukocytes, UA TRACE (*) NEGATIVE  GLUCOSE, CAPILLARY      Component Value Range   Glucose-Capillary 88  70 - 99 mg/dL  URINE MICROSCOPIC-ADD ON      Component Value Range   Squamous Epithelial / LPF RARE  RARE   WBC, UA 3-6  <3 WBC/hpf   Bacteria, UA RARE  RARE   Urine-Other MUCOUS PRESENT       Dg Chest Port 1 View  11/06/2011  *RADIOLOGY REPORT*  Clinical Data: Possible seizures  PORTABLE CHEST - 1 VIEW  Comparison: 11/03/2011  Findings: Hyperinflation noted.  Normal heart size and vascularity. Improved left lower lobe aeration compatible with resolving atelectasis or pneumonia.  No current airspace process, edema, collapse, consolidation, effusion, or pneumothorax.  Right posterior sixth rib fracture noted.  IMPRESSION: Improving/resolving left lower lobe airspace process or pneumonia.  Hyperinflation   Original Report Authenticated By: Judie Petit. Ruel Favors, M.D.       1. Syncope   2. Hypokalemia       MDM  Syncope, without clear cause, possibly related to dehydration. No evidence for seizure. Review of history does not reveal any seizure history nor treatment. Duane Richardson is a very minimally prolonged QT, interval on EKG. I doubt cardiac arrhythmia, sepsis, or acute CNS event causing syncope. Hypokalemia is incidental and may reflect to dehydration and/or poor nutrition.  The recently diagnosed pneumonia, has improved on the x-ray, today. The patient is stable for discharge.      I personally performed the services described in this documentation, which was scribed in my presence. The recorded information has been reviewed and considered.     Plan: Home Medications- usual; Home Treatments- rest, push fluids; Recommended follow up- PCP of choice prn.   Flint Melter, MD 11/06/11 [phone]

## 2011-11-15 LAB — COMPREHENSIVE METABOLIC PANEL
ALT: 23 U/L (ref 10–40)
AST: 58 U/L
Albumin: 3.5
Alkaline Phosphatase: 89 U/L
BUN: 6 mg/dL (ref 4–21)
CO2: 27 mmol/L
Calcium: 8.4 mg/dL
Chloride: 108 mmol/L
Creat: 0.65
Glucose: 62
Potassium: 4.4 mmol/L
Sodium: 146 mmol/L (ref 137–147)
Total Bilirubin: 0.6 mg/dL
Total Protein: 6.2 g/dL

## 2011-11-15 LAB — CBC WITH DIFFERENTIAL/PLATELET
HCT: 41 %
Hemoglobin: 14 g/dL (ref 13.5–17.5)
PSA: 6.37
WBC: 4
platelet count: 250

## 2011-11-17 LAB — HEPATITIS PANEL, ACUTE
Hep A IgM: NEGATIVE
Hep B C Total Ab Interp: NEGATIVE
Hepatitis B Surface Antigen: NEGATIVE
Hepatitis C Ab: NEGATIVE

## 2011-12-29 ENCOUNTER — Encounter: Payer: Self-pay | Admitting: Internal Medicine

## 2011-12-30 ENCOUNTER — Ambulatory Visit (INDEPENDENT_AMBULATORY_CARE_PROVIDER_SITE_OTHER): Payer: Medicaid Other | Admitting: Urgent Care

## 2011-12-30 ENCOUNTER — Encounter: Payer: Self-pay | Admitting: Urgent Care

## 2011-12-30 VITALS — BP 126/90 | HR 112 | Temp 98.1°F | Ht 73.0 in | Wt 140.8 lb

## 2011-12-30 DIAGNOSIS — R1310 Dysphagia, unspecified: Secondary | ICD-10-CM | POA: Insufficient documentation

## 2011-12-30 DIAGNOSIS — Z86010 Personal history of colonic polyps: Secondary | ICD-10-CM

## 2011-12-30 DIAGNOSIS — K625 Hemorrhage of anus and rectum: Secondary | ICD-10-CM | POA: Insufficient documentation

## 2011-12-30 DIAGNOSIS — R634 Abnormal weight loss: Secondary | ICD-10-CM | POA: Insufficient documentation

## 2011-12-30 DIAGNOSIS — B370 Candidal stomatitis: Secondary | ICD-10-CM

## 2011-12-30 MED ORDER — NYSTATIN 100000 UNIT/ML MT SUSP: 500000.0000 [IU] | Freq: Four times a day (QID) | OROMUCOSAL | Status: AC

## 2011-12-30 MED ORDER — PEG 3350-KCL-NA BICARB-NACL 420 G PO SOLR: 4000.0000 mL | ORAL | Status: DC

## 2011-12-30 MED ORDER — OMEPRAZOLE 20 MG PO CPDR: 20.0000 mg | DELAYED_RELEASE_CAPSULE | Freq: Every day | ORAL | Status: DC

## 2011-12-30 NOTE — Progress Notes (Signed)
Referring Provider: Donita Brooks, MD Primary Care Physician:  Leo Grosser, MD Primary Gastroenterologist:  Dr. Jena Gauss  Chief Complaint  Patient presents with  . Weight Loss  . Dysphagia    HPI:  Duane Richardson is a 56 y.o. male here as a referral from Dr. Tanya Nones for dysphagia.  He reports several months hx of dysphagia with solids & liquids.  Everything he swallows he immediately regurgitates.  He has odynophagia.  He cannot keep anything down long.  He has lost 50# unintentionally.  Hx heavy alcohol abuse quitting 3 weeks ago.  C/o heartburn & indigestion daily.  He takes BCP or Goodys "a lot".  Denies lower abdominal pain.   Denies nausea or vomiting.  Denies any lower GI symptoms including constipation, diarrhea, melena or weight loss.  Denies fever or chills.  He has been seeing moderate amounts of rectal bleeding with wiping on the toilet tissue.  Last episode was 4 days ago.  On last colonoscopy he had multiple tubular adenomas removed by Dr Jena Gauss 11/2009.  Labs reviewed from  11/15/11:  Na 146, glucose 62, AST 58, otherwise normal CMP CBC with normal Hgb, plts, WBC.  PSA elevated 6.37.  UA normal. UDS negative.  TSH normal 10/23/11.   Hepatitis Panel pending  Past Medical History  Diagnosis Date  . Depression   . Seizures   . Elevated PSA   . ETOH abuse     quit Oct 2013    Past Surgical History  Procedure Date  . Back surgery   . Colonoscopy 12/21/09    ZOX:WRUE papilla otherwise normal/ pancolonic diverticula/mutiple colonic poylps    Current Outpatient Prescriptions  Medication Sig Dispense Refill  . HYDROcodone-acetaminophen (NORCO) 10-325 MG per tablet Take 1 tablet by mouth daily.      . sertraline (ZOLOFT) 100 MG tablet Take 100 mg by mouth 2 (two) times daily.      Marland Kitchen nystatin (MYCOSTATIN) 100000 UNIT/ML suspension Take 5 mLs (500,000 Units total) by mouth 4 (four) times daily.  480 mL  0  . omeprazole (PRILOSEC) 20 MG capsule Take 1 capsule (20 mg total)  by mouth daily.  30 capsule  2  . polyethylene glycol-electrolytes (TRILYTE) 420 G solution Take 4,000 mLs by mouth as directed.  4000 mL  0    Allergies as of 12/30/2011  . (No Known Allergies)    Family History:There is no known family history of colorectal carcinoma , liver disease, or inflammatory bowel disease.  Problem Relation Age of Onset  . Hypertension Brother     History   Social History  . Marital Status: Single    Spouse Name: N/A    Number of Children: 0  . Years of Education: N/A   Occupational History  . disabled    Social History Main Topics  . Smoking status: Current Every Day Smoker -- 1.0 packs/day for 15 years    Types: Cigarettes  . Smokeless tobacco: Not on file  . Alcohol Use: No     Comment: quit drinking 3 weeks ago, 42YR HX HEAVY, drinking beer, wine, liquor  . Drug Use: No  . Sexually Active: Not on file   Other Topics Concern  . Not on file   Social History Narrative   In prison x 2, 14rs, 87 moOut for several yrsLives alone    Review of Systems: Gen: see HPI CV: Denies chest pain, angina, palpitations, syncope, orthopnea, PND, peripheral edema, and claudication. Resp: Denies dyspnea at rest, dyspnea with  exercise, cough, sputum, wheezing, coughing up blood, and pleurisy. GI: Denies vomiting blood, jaundice, and fecal incontinence.    GU: + hematuria. Elevated PSA.   MS: Denies joint pain, limitation of movement, and swelling, stiffness, low back pain, extremity pain. Denies muscle weakness, cramps, atrophy.  Derm: Denies rash, itching, dry skin, hives, moles, warts, or unhealing ulcers.  Psych: +depression, anxiety-worried about weight loss Heme: Denies bruising, bleeding, and enlarged lymph nodes. Neuro:  Denies any headaches, dizziness, paresthesias. Endo:  Denies any problems with DM, thyroid, adrenal function.  Physical Exam: BP 126/90  Pulse 112  Temp 98.1 F (36.7 C) (Temporal)  Ht 6\' 1"  (1.854 m)  Wt 140 lb 12.8 oz (63.866  kg)  BMI 18.58 kg/m2 No LMP for male patient. General:   Alert,  Well-developed, thin, pleasant and cooperative in NAD.  Accompanied by Ether Griffins, Social Worker. Head:  Normocephalic and atraumatic. Eyes:  Sclera clear, no icterus.   Conjunctiva pink. Ears:  Normal auditory acuity. Nose:  No deformity, discharge, or lesions. Mouth: +white plaque to tongue, posterior pharynx.  Neck:  Supple; no masses or thyromegaly. Lungs:  Clear throughout to auscultation.   No wheezes, crackles, or rhonchi. No acute distress. Heart:  Regular rate and rhythm; no murmurs, clicks, rubs,  or gallops. Abdomen:  Normal bowel sounds.  No bruits.  Soft, non-tender and non-distended without masses, hepatosplenomegaly or hernias noted.  No guarding or rebound tenderness.   Rectal:  Deferred. Msk:  Symmetrical without gross deformities.  Pulses:  Normal pulses noted. Extremities:  + clubbing.  No edema. Neurologic:  Alert and oriented x4;  grossly normal neurologically. Skin:  Intact without significant lesions or rashes. Lymph Nodes:  No significant cervical adenopathy. Psych:  Alert and cooperative. Normal mood and affect.;

## 2011-12-30 NOTE — Patient Instructions (Addendum)
Please make appt w/ Dr Tanya Nones for blood in urine EGD & colonoscopy with Dr Jena Gauss.  He may dilate your esophagus at the same time Take Nystatin as directed for yeast in your mouth Begin omeprazole 20mg  daily   Dysphagia Diet Level 2, Mechanically Altered This dysphagia mechanically altered diet is restricted to:  Foods that are moist, soft-textured, and easy to chew and swallow.  Meats that are ground or minced to no larger than -inch pieces. Meats are moist with gravy or sauce added.  Foods that do not include bread or bread-like textures except soft pancakes, well-moistened with syrup or sauce.  Textures with some chewing ability required.  Casseroles without rice.  Cooked vegetables that are less than -inch in size and easily mashed with a fork. No cooked corn, peas, broccoli, cauliflower, cabbage, Brussels sprouts, asparagus, or other fibrous, non-tender, or rubbery cooked vegetables.  Canned fruit except for pineapple. Fruit must be cut into no larger than -inch pieces.  Foods that do not include nuts, seeds, coconut, or sticky textures. FOOD TEXTURES Includes all foods listed on Dysphagia Diet Level 1, Pureed, in addition to the foods listed below. Beverages  Recommended: All beverages thickened to recommended consistency with minimal amounts of texture pulp. Any texture should be suspended in the liquid and should not fall out.  Avoid: All others.  You are currently limited to one of the following liquid consistency levels:  Thin.  Nectar-like.  Honey-like.  Spoon-thick. Breads  Recommended: Soft pancakes, well-moistened with syrup or sauce.  Avoid: All others. Cereals  Recommended: Cooked cereals with little texture, including oatmeal. Unprocessed wheat bran stirred into cereals for bulk. If thin liquids are restricted, it is important that all of the liquid is absorbed into the cereal.  Avoid: All dry cereals and any cooked cereals that may contain flax  seeds or other seeds or nuts. Whole-grain, dry, or coarse cereals. Cereals with nuts, seeds, dried fruit, or coconut. Desserts  Recommended: Pudding, custard. Soft fruit pies with bottom crust only. Canned fruit (excluding pineapple). Soft, moist cakes with icing.  Avoid: Dry, coarse cakes and cookies. Anything with nuts, seeds, coconut, pineapple, or dried fruit. Breakfast yogurt with nuts. Rice or bread pudding.  These foods are considered thin liquids and should be avoided if thin liquids are restricted:  Frozen malts, milk shakes, frozen yogurt, eggnog, nutritional supplements, ice cream, sherbet, regular or sugar-free gelatin, or any foods that become thin liquid at either room temperature, 70 F (21.1 C) or body temperature, 98 F (36.7 C). Fats  Recommended: Butter, margarine, cream for cereal (depending on liquid consistency recommendations), gravy, cream sauces, sour cream, sour cream dips with soft additives, mayonnaise, salad dressings, cream cheese, cream cheese spreads with soft additives, whipped toppings.  Avoid: All fats with coarse or chunky additives. Fruits  Recommended: Soft drained, canned, or cooked fruits without seeds or skin. Fresh soft and ripe banana. Fruit juices with a small amount of pulp. If thin liquids are restricted, fruit juices should be thickened to appropriate consistency.  Avoid: Fresh or frozen fruits. Cooked fruit with skin or seeds. Dried fruits. Fresh, canned, or cooked pineapple. Meats and Meat Substitutes Meat pieces should not exceed -inch cubes and should be tender.  Recommended: Moistened ground or cooked meat, poultry, or fish. Moist ground or tender meat may be served with gravy or sauce. Casseroles without rice. Moist macaroni and cheese, well-cooked pasta with meat sauce, tuna noodle casserole, soft, moist lasagna. Moist meatballs, meatloaf, or fish loaf. Protein  salads, such as tuna or egg without large chunks, celery, or onion. Cottage  cheese, smooth quiche without large chunks. Poached, scrambled, or soft-cooked eggs (egg yolks should not be "runny" but should be moist and able to be mashed with butter, margarine, or other moisture added to them). Cook eggs to 160 F (71.1 C) or use pasteurized eggs for safety. Souffls may have small, soft chunks. Tofu. Well-cooked, slightly mashed, moist legumes such as baked beans. All meats or protein substitutes should be served with sauces or moistened to help maintain cohesiveness in the mouth.  Avoid: Dry meats and tough meats, such as bacon, sausage, hot dogs, and bratwurst. Dry casseroles or casseroles with rice or large chunks. Peanut butter. Cheese slices and cubes. Hard-cooked or crisp fried eggs. Sandwiches. Pizza. Potatoes and Starches  Recommended: Well-cooked, moistened, boiled, baked, or mashed potatoes. Well-cooked shredded hash brown potatoes that are not crisp. All potatoes need to be moist and in sauces. Well-cooked noodles in sauce. Spaetzel or soft dumplings that have been moistened with butter or gravy.  Avoid: Potato skins and chips. Fried or French-fried potatoes. Rice. Soups  Recommended: Soups with easy-to-chew or easy-to-swallow meats or vegetables. Contents in soups should be less than -inch pieces. Soups will need to be thickened to appropriate consistency if soup is thinner than prescribed liquid consistency.  Avoid: Soups with large chunks of meat and vegetables. Soups with rice, corn, peas. Vegetables  Recommended: All soft, well-cooked vegetables. Vegetables should be less than -inch pieces. They should be easily mashed with a fork.  Avoid: Cooked corn and peas. Broccoli, cabbage, Brussels sprouts, asparagus, or other fibrous, non-tender, or rubbery cooked vegetables. Miscellaneous  Recommended: Jams and preserves without seeds, jelly. Sauces or salsas with small, tender, less than -inch pieces. Soft, smooth chocolate bars that are easily  chewed.  Avoid: Seeds, nuts, coconut, or sticky foods. Chewy candies such as caramels or licorice. Document Released: 02/10/2005 Document Revised: 05/05/2011 Document Reviewed: 03/05/2009 [facility] Patient Information 2013 [place], [place].

## 2011-12-31 ENCOUNTER — Telehealth: Payer: Self-pay | Admitting: Urgent Care

## 2011-12-31 ENCOUNTER — Encounter: Payer: Self-pay | Admitting: Urgent Care

## 2011-12-31 NOTE — Telephone Encounter (Signed)
Please call Dr Caren Macadam office to see if they have hepatitis panel results from Sept on this pt? THanks

## 2011-12-31 NOTE — Telephone Encounter (Signed)
Received

## 2012-01-01 NOTE — Assessment & Plan Note (Signed)
Nystatin 100,000 U/ml 5ml po QID x 3 weeks EGD for further evaluation

## 2012-01-01 NOTE — Assessment & Plan Note (Deleted)
Begin omeprazole 20 mg daily 

## 2012-01-01 NOTE — Assessment & Plan Note (Addendum)
Duane Richardson is a pleasant 56 y.o. male with dysphagia with both solids & liquids along with regurgitation.  Hx etoh abuse & quit 3 weeks ago.  He has oral candida & could have candida esophagitis.  He has significant 50# unintentional weight loss.  We need to r/o malignancy, erosive/ulcerative esophagitis, esophageal stricture, PUD or h pylori.  EGD w/ possible esophageal dilation  w/ Dr Jena Gauss.  I have discussed risks & benefits which include, but are not limited to, bleeding, infection, perforation & drug reaction.  The patient agrees with this plan & written consent will be obtained. Pt had no difficulty with sedation on last procedure done in 2011.  Dysphagia 2 diet

## 2012-01-01 NOTE — Assessment & Plan Note (Signed)
He has multiple medical issues right now all of which could be contributing to weight loss.  Primarily we need to r/o malignancy.  He has hematuria & elevated PSA & has been referred to urology.  Dysphagia & rectal bleeding work-up as described.  Hx chronic etoh abuse ? Underlying liver disease.  EGD & colonoscopy planned.  Further recommendations to follow.

## 2012-01-01 NOTE — Progress Notes (Signed)
Faxed to PCP

## 2012-01-01 NOTE — Assessment & Plan Note (Signed)
Rectal bleeding is concerning along with 50# unintentional weight loss.  Last colonoscopy with multiple adenomatous polyps in 11/2009 by Dr Jena Gauss.  Colonoscopy to determine source of rectal bleeding.  I have discussed risks & benefits which include, but are not limited to, bleeding, infection, perforation & drug reaction.  The patient agrees with this plan & written consent will be obtained.

## 2012-01-01 NOTE — Progress Notes (Signed)
Acute Hepatitis Panel negative 11/17/11

## 2012-01-19 ENCOUNTER — Encounter (HOSPITAL_COMMUNITY)
Admission: RE | Disposition: A | Discharge status: Home / Self Care | Payer: Self-pay | Source: Ambulatory Visit | Attending: Internal Medicine

## 2012-01-19 ENCOUNTER — Ambulatory Visit (HOSPITAL_COMMUNITY)
Admission: RE | Admit: 2012-01-19 | Discharge: 2012-01-19 | Disposition: A | Discharge status: Home / Self Care | Payer: Medicaid Other | Source: Ambulatory Visit | Attending: Internal Medicine | Admitting: Internal Medicine

## 2012-01-19 ENCOUNTER — Encounter (HOSPITAL_COMMUNITY): Payer: Self-pay | Admitting: *Deleted

## 2012-01-19 DIAGNOSIS — Z860101 Personal history of adenomatous and serrated colon polyps: Secondary | ICD-10-CM

## 2012-01-19 DIAGNOSIS — Z86010 Personal history of colonic polyps: Secondary | ICD-10-CM

## 2012-01-19 DIAGNOSIS — R1310 Dysphagia, unspecified: Secondary | ICD-10-CM

## 2012-01-19 DIAGNOSIS — Z538 Procedure and treatment not carried out for other reasons: Secondary | ICD-10-CM | POA: Insufficient documentation

## 2012-01-19 DIAGNOSIS — R634 Abnormal weight loss: Secondary | ICD-10-CM

## 2012-01-19 DIAGNOSIS — Z1211 Encounter for screening for malignant neoplasm of colon: Secondary | ICD-10-CM | POA: Insufficient documentation

## 2012-01-19 DIAGNOSIS — K625 Hemorrhage of anus and rectum: Secondary | ICD-10-CM

## 2012-01-19 SURGERY — CANCELLED PROCEDURE

## 2012-01-19 MED ORDER — SODIUM CHLORIDE 0.45 % IV SOLN
INTRAVENOUS | Status: DC
  Administered 2012-01-19: 12:00:00 via INTRAVENOUS

## 2012-01-19 NOTE — Progress Notes (Signed)
I heard pt. Verbalize that he was going to call primary MD for BP evaluation today.

## 2012-01-19 NOTE — OR Nursing (Signed)
Dr. Jena Gauss notified of patient eating chicken and rice yesterday after drinking his colon prep. Patient states he is still passing solid brown stool. Dr. Jena Gauss cancelled TCS and will proceed with EGD with ED. Patient informed and verbalized understanding.

## 2012-01-19 NOTE — Progress Notes (Addendum)
Patient ID: Duane Richardson, male   DOB: [birth year], 56 y.o.   MRN: [id number] Patient here for an EGD and a colonoscopy. Patient told us he ate chicken and rice last evening because he was hungry. Consequently, colonoscopy was canceled. We were tentatively planning to perform EGD only today as he did report being n.p.o. past midnight. However,  his blood pressure was noted to be elevated at 177/113 repeatedly today (was 126/90 in our office). Patient quite tremulous as well. Reportedly, started drinking again -  has been consuming wine (per his report). I decided to go ahead and cancel both colonoscopy and EGD . I strongly recommended he followup with his blood pressure, etc. with Dr. Tanya Nones ASAP. We'll plan to get him back in the office in 3-4 weeks to set up for an endoscopic evaluation.

## 2012-01-26 ENCOUNTER — Encounter: Payer: Self-pay | Admitting: Internal Medicine

## 2012-01-29 ENCOUNTER — Other Ambulatory Visit: Payer: Self-pay | Admitting: Family Medicine

## 2012-02-02 ENCOUNTER — Ambulatory Visit (HOSPITAL_COMMUNITY): Payer: Medicaid Other

## 2012-02-04 ENCOUNTER — Ambulatory Visit (HOSPITAL_COMMUNITY)
Admission: RE | Admit: 2012-02-04 | Discharge: 2012-02-04 | Disposition: A | Discharge status: Home / Self Care | Payer: Medicaid Other | Source: Ambulatory Visit | Attending: Family Medicine | Admitting: Family Medicine

## 2012-02-04 DIAGNOSIS — R7989 Other specified abnormal findings of blood chemistry: Secondary | ICD-10-CM | POA: Insufficient documentation

## 2012-02-04 DIAGNOSIS — I10 Essential (primary) hypertension: Secondary | ICD-10-CM | POA: Insufficient documentation

## 2012-02-12 ENCOUNTER — Encounter: Payer: Self-pay | Admitting: Gastroenterology

## 2012-02-12 ENCOUNTER — Ambulatory Visit (INDEPENDENT_AMBULATORY_CARE_PROVIDER_SITE_OTHER): Payer: Medicaid Other | Admitting: Gastroenterology

## 2012-02-12 VITALS — BP 125/81 | HR 90 | Temp 98.1°F | Ht 73.0 in | Wt 146.0 lb

## 2012-02-12 DIAGNOSIS — B370 Candidal stomatitis: Secondary | ICD-10-CM

## 2012-02-12 DIAGNOSIS — R634 Abnormal weight loss: Secondary | ICD-10-CM

## 2012-02-12 DIAGNOSIS — R1310 Dysphagia, unspecified: Secondary | ICD-10-CM

## 2012-02-12 DIAGNOSIS — Z86010 Personal history of colonic polyps: Secondary | ICD-10-CM

## 2012-02-12 LAB — CREATININE, SERUM: Creat: 0.69 mg/dL (ref 0.50–1.35)

## 2012-02-12 NOTE — Progress Notes (Signed)
Referring Provider: Donita Brooks, MD Primary Care Physician:  Leo Grosser, MD Primary GI: Dr. Jena Gauss   Chief Complaint  Patient presents with  . Follow-up    HPI:   56 year old pleasant male who returns today in follow-up for consideration of TCS and EGD. He was seen Nov 2013 by Lorenza Burton, NP, due to dysphagia, rectal bleeding, unintentional weight loss. He was noted to have oral candida  Denies any further dysphagia, states he is able to keep food down. No further regurgitating. Denies painful swallowing. No reflux. No further BC powders or Goody's powders. No lower abdominal pain. Denies any N/V. Denies constipation, diarrhea, melena. No further rectal bleeding. Up 6 lbs. Good appetite. Took Nystatin swish and swallow with significant improvement. BM every day. PSA elevated, with upcoming urologist appt. States last he weighed 200lbs was about 10 years ago when he was "locked up".   Notes weighing 200+ lbs at one time. However, our documentation does not show this.  On last colonoscopy he had multiple tubular adenomas removed by Dr Jena Gauss 11/2009.  [place] abd 12/13 IMPRESSION:  1. Echogenic liver consistent with diffuse fatty infiltration. No  ductal dilatation.  2. No gallstones.   Vitals - 1 value per visit 02/12/2012 01/19/2012 12/30/2011 11/06/2011  Weight (lb) 146  140.8    Vitals - 1 value per visit 11/03/2011 05/07/2011 04/07/2011 03/28/2011 07/08/2007  Weight (lb) 140 150 160 155 155   Vitals - 1 value per visit 06/29/2007 06/24/2007 12/21/2006 11/09/2006 09/28/2006  Weight (lb) 163 160 159 161 160   Vitals - 1 value per visit 09/03/2006 08/27/2006  Weight (lb) 159 156    Past Medical History  Diagnosis Date  . Depression   . Seizures   . Elevated PSA   . ETOH abuse     quit Oct 2013    Past Surgical History  Procedure Date  . Back surgery   . Colonoscopy 12/21/09    UJW:JXBJ papilla otherwise normal/ pancolonic diverticula/mutiple colonic poylps    Current  Outpatient Prescriptions  Medication Sig Dispense Refill  . amLODipine (NORVASC) 10 MG tablet Take 10 mg by mouth daily.      Marland Kitchen omeprazole (PRILOSEC) 20 MG capsule Take 1 capsule (20 mg total) by mouth daily.  30 capsule  2  . sertraline (ZOLOFT) 100 MG tablet Take 100 mg by mouth 2 (two) times daily.      Marland Kitchen HYDROcodone-acetaminophen (NORCO) 10-325 MG per tablet Take 1 tablet by mouth daily.      . polyethylene glycol-electrolytes (TRILYTE) 420 G solution Take 4,000 mLs by mouth as directed.  4000 mL  0    Allergies as of 02/12/2012  . (No Known Allergies)    Family History  Problem Relation Age of Onset  . Hypertension Brother   . Colon cancer Neg Hx     History   Social History  . Marital Status: Single    Spouse Name: N/A    Number of Children: 0  . Years of Education: N/A   Occupational History  . disabled    Social History Main Topics  . Smoking status: Current Every Day Smoker -- 1.0 packs/day for 30 years    Types: Cigarettes  . Smokeless tobacco: None  . Alcohol Use: 18.0 oz/week    30 Glasses of wine per week  . Drug Use: No  . Sexually Active: None   Other Topics Concern  . None   Social History Narrative   In prison x 2, 14rs,  29 moOut for several yrsLives alone    Review of Systems: Negative unless otherwise mentioned in HPI  Physical Exam: BP 125/81  Pulse 90  Temp 98.1 F (36.7 C) (Oral)  Ht 6\' 1"  (1.854 m)  Wt 146 lb (66.225 kg)  BMI 19.26 kg/m2 General:   Alert and oriented. No distress noted. Pleasant and cooperative.  Head:  Normocephalic and atraumatic. Eyes:  Conjuctiva clear without scleral icterus. Mouth:  No evidence of continued oral thrush  Neck:  Supple, without mass or thyromegaly. Heart:  S1, S2 present without murmurs, rubs, or gallops. Regular rate and rhythm. Abdomen:  +BS, soft, non-tender and non-distended. No rebound or guarding. No HSM or masses noted. Msk:  Symmetrical without gross deformities. Normal  posture. Extremities:  Without edema. Neurologic:  Alert and  oriented x4;  grossly normal neurologically. Skin:  Intact without significant lesions or rashes. Psych:  Alert and cooperative. Normal mood and affect.

## 2012-02-12 NOTE — Patient Instructions (Addendum)
We have scheduled you for a scan of your belly. Please have blood work done today.   Please call us if you have ANY further symptoms such as belly pain, problems swallowing, painful swallowing, continued weight loss, lack of appetite, rectal bleeding.   Otherwise, I will see you back in January 2014.   Have a wonderful Christmas and New Year!

## 2012-02-17 DIAGNOSIS — R634 Abnormal weight loss: Secondary | ICD-10-CM | POA: Insufficient documentation

## 2012-02-17 NOTE — Assessment & Plan Note (Signed)
Resolved

## 2012-02-17 NOTE — Assessment & Plan Note (Addendum)
Up 6 lbs from last visit. Last TCS in Oct 2011. No further rectal bleeding. Pt interested in holding off on TCS/EGD currently. Will schedule CT to evaluate for occult malignancy. See dysphagia and oral candida.

## 2012-02-17 NOTE — Assessment & Plan Note (Signed)
Resolved with treatment. Likely source of dysphagia but unable to r/o other occult etiologies. However, pt wants to hold off on EGD currently. Will have him return in Jan 2014. If any further problems, low threshhold for EGD/ED.

## 2012-02-23 NOTE — Progress Notes (Signed)
Faxed to PCP

## 2012-03-02 ENCOUNTER — Ambulatory Visit (HOSPITAL_COMMUNITY): Admission: RE | Admit: 2012-03-02 | Payer: Medicaid Other | Source: Ambulatory Visit

## 2012-03-03 ENCOUNTER — Other Ambulatory Visit (HOSPITAL_COMMUNITY): Payer: Self-pay | Admitting: Urology

## 2012-03-03 DIAGNOSIS — R310 Gross hematuria: Secondary | ICD-10-CM

## 2012-03-05 ENCOUNTER — Ambulatory Visit (HOSPITAL_COMMUNITY)
Admission: RE | Admit: 2012-03-05 | Discharge: 2012-03-05 | Disposition: A | Discharge status: Home / Self Care | Payer: Medicaid Other | Source: Ambulatory Visit | Attending: Urology | Admitting: Urology

## 2012-03-05 DIAGNOSIS — N201 Calculus of ureter: Secondary | ICD-10-CM | POA: Insufficient documentation

## 2012-03-05 DIAGNOSIS — R310 Gross hematuria: Secondary | ICD-10-CM

## 2012-03-05 MED ORDER — IOHEXOL 300 MG/ML  SOLN
100.0000 mL | Freq: Once | INTRAMUSCULAR | Status: AC | PRN
  Administered 2012-03-05: 100 mL via INTRAVENOUS

## 2012-03-16 ENCOUNTER — Ambulatory Visit: Payer: Medicaid Other | Admitting: Gastroenterology

## 2012-03-31 ENCOUNTER — Encounter (HOSPITAL_COMMUNITY): Payer: Self-pay | Admitting: Emergency Medicine

## 2012-03-31 ENCOUNTER — Emergency Department (HOSPITAL_COMMUNITY)
Admission: EM | Admit: 2012-03-31 | Discharge: 2012-03-31 | Disposition: A | Discharge status: Home / Self Care | Payer: Medicaid Other | Attending: Emergency Medicine | Admitting: Emergency Medicine

## 2012-03-31 DIAGNOSIS — F17200 Nicotine dependence, unspecified, uncomplicated: Secondary | ICD-10-CM | POA: Insufficient documentation

## 2012-03-31 DIAGNOSIS — W4909XA Other specified item causing external constriction, initial encounter: Secondary | ICD-10-CM | POA: Insufficient documentation

## 2012-03-31 DIAGNOSIS — Y939 Activity, unspecified: Secondary | ICD-10-CM | POA: Insufficient documentation

## 2012-03-31 DIAGNOSIS — Z8669 Personal history of other diseases of the nervous system and sense organs: Secondary | ICD-10-CM | POA: Insufficient documentation

## 2012-03-31 DIAGNOSIS — Y929 Unspecified place or not applicable: Secondary | ICD-10-CM | POA: Insufficient documentation

## 2012-03-31 DIAGNOSIS — S6980XA Other specified injuries of unspecified wrist, hand and finger(s), initial encounter: Secondary | ICD-10-CM | POA: Insufficient documentation

## 2012-03-31 DIAGNOSIS — F3289 Other specified depressive episodes: Secondary | ICD-10-CM | POA: Insufficient documentation

## 2012-03-31 DIAGNOSIS — S6990XA Unspecified injury of unspecified wrist, hand and finger(s), initial encounter: Secondary | ICD-10-CM | POA: Insufficient documentation

## 2012-03-31 DIAGNOSIS — F329 Major depressive disorder, single episode, unspecified: Secondary | ICD-10-CM | POA: Insufficient documentation

## 2012-03-31 MED ORDER — DOUBLE ANTIBIOTIC 500-10000 UNIT/GM EX OINT
TOPICAL_OINTMENT | Freq: Once | CUTANEOUS | Status: AC
  Administered 2012-03-31: 12:00:00 via TOPICAL
  Filled 2012-03-31: qty 1

## 2012-03-31 NOTE — ED Notes (Signed)
Pt states swelling to ring finger for two days. Pt denies injury

## 2012-03-31 NOTE — ED Provider Notes (Signed)
History     CSN: [id number]  Arrival date & time 03/31/12  1046   First MD Initiated Contact with Patient 03/31/12 1102      Chief Complaint  Patient presents with  . Hand Pain    (Consider location/radiation/quality/duration/timing/severity/associated sxs/prior treatment) HPI Comments: Patient c/o pain and swelling to his right ring finger for two days.  States that he has tried to remove a ring that has been stuck on his finger and he has been unable to remove it.  Noticed increased swelling and redness since trying to remove it.  He denies numbness to the finger.    Patient is a 57 y.o. male presenting with hand pain. The history is provided by the patient.  Hand Pain This is a new problem. The current episode started in the past 7 days. The problem occurs constantly. The problem has been rapidly worsening. Associated symptoms include arthralgias and joint swelling. Pertinent negatives include no chills, fever, numbness, rash or weakness. The symptoms are aggravated by bending (finger movement). He has tried nothing for the symptoms. The treatment provided no relief.    Past Medical History  Diagnosis Date  . Depression   . Seizures   . Elevated PSA   . ETOH abuse     quit Oct 2013, then relapsed, as of Dec 2013 now has abstained X 1 month    Past Surgical History  Procedure Date  . Back surgery   . Colonoscopy 12/21/09    WUJ:WJXB papilla otherwise normal/ pancolonic diverticula/mutiple colonic poylps    Family History  Problem Relation Age of Onset  . Hypertension Brother   . Colon cancer Neg Hx     History  Substance Use Topics  . Smoking status: Current Every Day Smoker -- 1.0 packs/day for 30 years    Types: Cigarettes  . Smokeless tobacco: Not on file  . Alcohol Use: 18.0 oz/week    30 Glasses of wine per week      Review of Systems  Constitutional: Negative for fever and chills.  Genitourinary: Negative for dysuria and difficulty urinating.    Musculoskeletal: Positive for joint swelling and arthralgias.  Skin: Negative for color change, rash and wound.  Neurological: Negative for weakness and numbness.  All other systems reviewed and are negative.    Allergies  Review of patient's allergies indicates no known allergies.  Home Medications   Current Outpatient Rx  Name  Route  Sig  Dispense  Refill  . AMLODIPINE BESYLATE 10 MG PO TABS   Oral   Take 10 mg by mouth daily.         Marland Kitchen OMEPRAZOLE 20 MG PO CPDR   Oral   Take 1 capsule (20 mg total) by mouth daily.   30 capsule   2   . SERTRALINE HCL 100 MG PO TABS   Oral   Take 100 mg by mouth 2 (two) times daily.           BP 139/112  Pulse 100  Temp 98.5 F (36.9 C) (Oral)  Resp 20  Ht 6\' 1"  (1.854 m)  Wt 144 lb (65.318 kg)  BMI 19.00 kg/m2  SpO2 99%  Physical Exam  Nursing note and vitals reviewed. Constitutional: He is oriented to person, place, and time. He appears well-developed and well-nourished. No distress.  HENT:  Head: Normocephalic and atraumatic.  Cardiovascular: Normal rate, regular rhythm, normal heart sounds and intact distal pulses.   No murmur heard. Pulmonary/Chest: Effort normal and breath sounds  normal. No respiratory distress.  Musculoskeletal: Normal range of motion. He exhibits edema and tenderness.       Hands:      Ring to the proximal right ring finger with significant STS and erythema of the finger distal to the ring.  Pt has full ROM and distal sensation intact, CR< 3 sec  Neurological: He is alert and oriented to person, place, and time. He exhibits normal muscle tone. Coordination normal.  Skin: Skin is warm and dry.    ED Course  Procedures (including critical care time)  Labs Reviewed - No data to display No results found.      MDM   Ring to the right ring finger was removed successfully using a battery operated ring cutter.  Pt tolerated well.  STS and erythema of the finger distal to the ring, improved  after removal.  Pt advised to return here for any worsening pain, numbness or color changes to the finger.     Pt is hypertensive, but denies sx's other than finger pain.  advised to take his BP medications regularly      Virgie Chery L. [place], [place] 03/31/12 2117

## 2012-03-31 NOTE — ED Notes (Signed)
Pt with swelling to right ring finger x 2 days per pt, pt with wide band ring on same finger, sore noted just under the band of ring

## 2012-03-31 NOTE — ED Notes (Signed)
Ring removed by cutting ring with GEM

## 2012-04-01 NOTE — ED Provider Notes (Signed)
Medical screening examination/treatment/procedure(s) were performed by non-physician practitioner and as supervising physician I was immediately available for consultation/collaboration. Sruti Ayllon, MD, FACEP   Amada Hallisey L Angellina Ferdinand, MD 04/01/12 1455 

## 2012-06-08 ENCOUNTER — Encounter: Payer: Self-pay | Admitting: Family Medicine

## 2012-06-08 DIAGNOSIS — F1010 Alcohol abuse, uncomplicated: Secondary | ICD-10-CM | POA: Insufficient documentation

## 2012-06-09 ENCOUNTER — Ambulatory Visit: Payer: Self-pay | Admitting: Physician Assistant

## 2012-07-14 ENCOUNTER — Ambulatory Visit (INDEPENDENT_AMBULATORY_CARE_PROVIDER_SITE_OTHER): Payer: Medicaid Other | Admitting: Gastroenterology

## 2012-07-14 ENCOUNTER — Encounter: Payer: Self-pay | Admitting: Gastroenterology

## 2012-07-14 ENCOUNTER — Other Ambulatory Visit: Payer: Self-pay | Admitting: Internal Medicine

## 2012-07-14 ENCOUNTER — Ambulatory Visit (HOSPITAL_COMMUNITY)
Admission: RE | Admit: 2012-07-14 | Discharge: 2012-07-14 | Disposition: A | Discharge status: Home / Self Care | Payer: Medicaid Other | Source: Ambulatory Visit | Attending: Gastroenterology | Admitting: Gastroenterology

## 2012-07-14 VITALS — BP 138/95 | HR 88 | Temp 98.2°F | Ht 73.0 in | Wt 137.2 lb

## 2012-07-14 DIAGNOSIS — R05 Cough: Secondary | ICD-10-CM | POA: Insufficient documentation

## 2012-07-14 DIAGNOSIS — R6881 Early satiety: Secondary | ICD-10-CM

## 2012-07-14 DIAGNOSIS — R059 Cough, unspecified: Secondary | ICD-10-CM | POA: Insufficient documentation

## 2012-07-14 DIAGNOSIS — F17200 Nicotine dependence, unspecified, uncomplicated: Secondary | ICD-10-CM | POA: Insufficient documentation

## 2012-07-14 DIAGNOSIS — R634 Abnormal weight loss: Secondary | ICD-10-CM | POA: Insufficient documentation

## 2012-07-14 DIAGNOSIS — Z86010 Personal history of colonic polyps: Secondary | ICD-10-CM

## 2012-07-14 DIAGNOSIS — R195 Other fecal abnormalities: Secondary | ICD-10-CM

## 2012-07-14 DIAGNOSIS — Z860101 Personal history of adenomatous and serrated colon polyps: Secondary | ICD-10-CM

## 2012-07-14 DIAGNOSIS — R61 Generalized hyperhidrosis: Secondary | ICD-10-CM | POA: Insufficient documentation

## 2012-07-14 MED ORDER — PEG 3350-KCL-NA BICARB-NACL 420 G PO SOLR: 4000.0000 mL | ORAL | Status: DC

## 2012-07-14 NOTE — Progress Notes (Signed)
Referring Provider: Donita Brooks, MD Primary Care Physician:  Leo Grosser, MD Primary GI: Dr. Jena Gauss   Chief Complaint  Patient presents with  . Colonoscopy  . Pain    side pain right    HPI:   Pleasant 57 year old male returning today to discuss possible colonoscopy and upper endoscopy. Last seen in Dec 2013, whereby he wanted to hold off on procedures. He was treated for oral candida last year, with resolution of dysphagia. Last TCS in 2011, with multiple tubular adenomas removed. CT Jan 2014 with marked prostamegaly.  Returns stating right-sided/lateral side pain, onset yesterday. Intermittent. No association with eating/drinking. Poor appetite. Not eating regularly. No dysphagia. +early satiety. No N/V. No melena. No bright red blood in stool. Now notes looser stools. X 1 month. Usually three times per day, no association with food. No cystoscopy yet; been rescheduled several times. He is unsure when his next appt will be. Caseworker is present.  [employer]. No recent abx. No sick contacts. [place].    Past Medical History  Diagnosis Date  . Depression   . Seizures   . Elevated PSA   . Arthritis   . Hyperlipidemia   . Cataract   . Anxiety   . Bipolar 1 disorder   . ETOH abuse     quit Oct 2013, then relapsed, as of Dec 2013 now has abstained X 1 month  . COPD (chronic obstructive pulmonary disease)   . Asthma   . Allergy     Past Surgical History  Procedure Laterality Date  . Back surgery    . Colonoscopy  12/21/09    UJW:JXBJ papilla otherwise normal/ pancolonic diverticula/mutiple colonic poylps  . Spine surgery      Current Outpatient Prescriptions  Medication Sig Dispense Refill  . sertraline (ZOLOFT) 100 MG tablet Take 100 mg by mouth 2 (two) times daily.      Marland Kitchen amLODipine (NORVASC) 10 MG tablet Take 10 mg by mouth daily.      Marland Kitchen omeprazole (PRILOSEC) 20 MG capsule Take 1 capsule (20 mg total) by mouth daily.  30 capsule  2   No current  facility-administered medications for this visit.    Allergies as of 07/14/2012  . (No Known Allergies)    Family History  Problem Relation Age of Onset  . Colon cancer Neg Hx   . Mental illness Sister     History   Social History  . Marital Status: Single    Spouse Name: N/A    Number of Children: 0  . Years of Education: N/A   Occupational History  . disabled    Social History Main Topics  . Smoking status: Current Every Day Smoker -- 1.00 packs/day for 30 years    Types: Cigarettes  . Smokeless tobacco: None  . Alcohol Use: 18.0 oz/week    30 Glasses of wine per week     Comment: No ETOH in 1 week. about a liter of wine per day  . Drug Use: No  . Sexually Active: None   Other Topics Concern  . None   Social History Narrative   In prison x 2, 14rs, 29 mo   Out for several yrs   Lives alone          Review of Systems: Negative unless mentioned in HPI Resp: +cough, night sweats  Physical Exam: BP 138/95  Pulse 88  Temp(Src) 98.2 F (36.8 C) (Oral)  Ht 6\' 1"  (1.854 m)  Wt 137 lb 3.2  oz (62.234 kg)  BMI 18.11 kg/m2 General:   Alert and oriented. No distress noted. Thin Head:  Normocephalic and atraumatic. Eyes:  Conjuctiva clear without scleral icterus. Mouth:  Oral mucosa pink and moist.  Neck:  Supple, without mass or thyromegaly. Heart:  S1, S2 present without murmurs, rubs, or gallops.  Resp: scattered rhonchi Abdomen:  +BS, soft, non-tender and non-distended. No rebound or guarding. No HSM or masses noted. Msk:  Symmetrical without gross deformities. Normal posture. Extremities:  Without edema. Neurologic:  Alert and  oriented x4;  grossly normal neurologically. Skin:  Intact without significant lesions or rashes. Cervical Nodes:  No significant cervical adenopathy. Psych:  Alert and cooperative.    Weights:  Feb 2013: 160 Sept 2013:: 140 May 2014: 137

## 2012-07-14 NOTE — Patient Instructions (Addendum)
Please see your urologist as soon as possible again.  Please have blood work and xray done today.  Complete the stool sample and return to the lab.  Finally, we have scheduled you for a colonoscopy and upper endoscopy with Dr. Jena Gauss in the near future.

## 2012-07-15 LAB — COMPREHENSIVE METABOLIC PANEL
ALT: 27 U/L (ref 0–53)
AST: 93 U/L — ABNORMAL HIGH (ref 0–37)
Albumin: 3.6 g/dL (ref 3.5–5.2)
Alkaline Phosphatase: 117 U/L (ref 39–117)
BUN: 4 mg/dL — ABNORMAL LOW (ref 6–23)
CO2: 25 mEq/L (ref 19–32)
Calcium: 9.2 mg/dL (ref 8.4–10.5)
Chloride: 100 mEq/L (ref 96–112)
Creat: 0.62 mg/dL (ref 0.50–1.35)
Glucose, Bld: 93 mg/dL (ref 70–99)
Potassium: 4.8 mEq/L (ref 3.5–5.3)
Sodium: 135 mEq/L (ref 135–145)
Total Bilirubin: 0.9 mg/dL (ref 0.3–1.2)
Total Protein: 7 g/dL (ref 6.0–8.3)

## 2012-07-15 LAB — PATHOLOGIST SMEAR REVIEW

## 2012-07-15 LAB — CBC WITH DIFFERENTIAL/PLATELET
Basophils Absolute: 0 10*3/uL (ref 0.0–0.1)
Basophils Relative: 1 % (ref 0–1)
Eosinophils Absolute: 0 10*3/uL (ref 0.0–0.7)
Eosinophils Relative: 0 % (ref 0–5)
HCT: 38.1 % — ABNORMAL LOW (ref 39.0–52.0)
Hemoglobin: 13.4 g/dL (ref 13.0–17.0)
Lymphocytes Relative: 53 % — ABNORMAL HIGH (ref 12–46)
Lymphs Abs: 1.1 10*3/uL (ref 0.7–4.0)
MCH: 32.8 pg (ref 26.0–34.0)
MCHC: 35.2 g/dL (ref 30.0–36.0)
MCV: 93.2 fL (ref 78.0–100.0)
Monocytes Absolute: 0.5 10*3/uL (ref 0.1–1.0)
Monocytes Relative: 25 % — ABNORMAL HIGH (ref 3–12)
Neutro Abs: 0.4 10*3/uL — ABNORMAL LOW (ref 1.7–7.7)
Neutrophils Relative %: 21 % — ABNORMAL LOW (ref 43–77)
Platelets: 108 10*3/uL — ABNORMAL LOW (ref 150–400)
RBC: 4.09 MIL/uL — ABNORMAL LOW (ref 4.22–5.81)
RDW: 16.1 % — ABNORMAL HIGH (ref 11.5–15.5)
WBC: 2 10*3/uL — ABNORMAL LOW (ref 4.0–10.5)

## 2012-07-15 LAB — TSH: TSH: 1.299 u[IU]/mL (ref 0.350–4.500)

## 2012-07-15 NOTE — Progress Notes (Signed)
Quick Note:  No pulmonary nodules noted on CXR.  No acute issues such as pneumonia. Proceed with plans as already indicated for TCS/EGD. ______

## 2012-07-16 DIAGNOSIS — R634 Abnormal weight loss: Secondary | ICD-10-CM | POA: Insufficient documentation

## 2012-07-16 DIAGNOSIS — R195 Other fecal abnormalities: Secondary | ICD-10-CM | POA: Insufficient documentation

## 2012-07-16 NOTE — Progress Notes (Signed)
Quick Note:  Called and LM for Duane Richardson, pts Child psychotherapist, with results. ______

## 2012-07-16 NOTE — Progress Notes (Signed)
Cc PCP 

## 2012-07-16 NOTE — Assessment & Plan Note (Signed)
57 year old male with history of multiple tubular adenomas in 2011, with recent low-volume hematochezia several visits ago now resolved. Concerning weight loss, unintentional. CT on file this year to evaluate for occult malignancy with marked prostatomegaly. He has established care with a urologist, but he has yet to follow through on recommendations to include a cystoscopy. Concern for occult process; proceed with lower GI evaluation in near future.  Proceed with TCS with Dr. Jena Gauss in near future: the risks, benefits, and alternatives have been discussed with the patient in detail. The patient states understanding and desires to proceed. 25 mg IV Phenergan on call due to ETOH use.

## 2012-07-16 NOTE — Assessment & Plan Note (Signed)
Steady loss of weight close to 30 lbs since Feb 2013. Early satiety noted, decreased appetite. No dysphagia. Proceed with EGD at time of colonoscopy due to persistent dyspepsia. To my knowledge, he has not had an upper endoscopy before. Must be noted, reported dysphagia last year but complete resolution after treatment for presumed candida esophagitis.   Check CBC, CMP, TSH CXR today due to cough, night sweats. AFEBRILE Proceed with upper endoscopy in the near future with Dr. Jena Gauss. The risks, benefits, and alternatives have been discussed in detail with patient. They have stated understanding and desire to proceed.  Phenergan 25 mg IV On call due to ETOH abuse.

## 2012-07-16 NOTE — Assessment & Plan Note (Signed)
New onset, no relation to eating/drinking. NO exposure to abx. Obtain stool studies and proceed with TCS as planned.

## 2012-07-22 ENCOUNTER — Encounter (HOSPITAL_COMMUNITY): Payer: Self-pay | Admitting: Pharmacy Technician

## 2012-07-27 LAB — GIARDIA ANTIGEN: Giardia Screen (EIA): NEGATIVE

## 2012-07-28 ENCOUNTER — Ambulatory Visit (HOSPITAL_COMMUNITY): Admission: RE | Admit: 2012-07-28 | Payer: Medicaid Other | Source: Ambulatory Visit | Admitting: Internal Medicine

## 2012-07-28 LAB — CLOSTRIDIUM DIFFICILE BY PCR: Toxigenic C. Difficile by PCR: NOT DETECTED

## 2012-07-28 SURGERY — COLONOSCOPY WITH ESOPHAGOGASTRODUODENOSCOPY (EGD)
Anesthesia: Moderate Sedation

## 2012-07-29 ENCOUNTER — Emergency Department (HOSPITAL_COMMUNITY): Payer: Medicaid Other

## 2012-07-29 ENCOUNTER — Emergency Department (HOSPITAL_COMMUNITY)
Admission: EM | Admit: 2012-07-29 | Discharge: 2012-07-29 | Discharge status: Against Medical Advice | Payer: Medicaid Other | Attending: Emergency Medicine | Admitting: Emergency Medicine

## 2012-07-29 ENCOUNTER — Encounter (HOSPITAL_COMMUNITY): Payer: Self-pay | Admitting: *Deleted

## 2012-07-29 DIAGNOSIS — F319 Bipolar disorder, unspecified: Secondary | ICD-10-CM | POA: Insufficient documentation

## 2012-07-29 DIAGNOSIS — J449 Chronic obstructive pulmonary disease, unspecified: Secondary | ICD-10-CM | POA: Insufficient documentation

## 2012-07-29 DIAGNOSIS — S298XXA Other specified injuries of thorax, initial encounter: Secondary | ICD-10-CM | POA: Insufficient documentation

## 2012-07-29 DIAGNOSIS — F1010 Alcohol abuse, uncomplicated: Secondary | ICD-10-CM | POA: Insufficient documentation

## 2012-07-29 DIAGNOSIS — R0781 Pleurodynia: Secondary | ICD-10-CM

## 2012-07-29 DIAGNOSIS — F411 Generalized anxiety disorder: Secondary | ICD-10-CM | POA: Insufficient documentation

## 2012-07-29 DIAGNOSIS — J4489 Other specified chronic obstructive pulmonary disease: Secondary | ICD-10-CM | POA: Insufficient documentation

## 2012-07-29 DIAGNOSIS — S0990XA Unspecified injury of head, initial encounter: Secondary | ICD-10-CM | POA: Insufficient documentation

## 2012-07-29 DIAGNOSIS — Z79899 Other long term (current) drug therapy: Secondary | ICD-10-CM | POA: Insufficient documentation

## 2012-07-29 DIAGNOSIS — F17200 Nicotine dependence, unspecified, uncomplicated: Secondary | ICD-10-CM | POA: Insufficient documentation

## 2012-07-29 DIAGNOSIS — Z8739 Personal history of other diseases of the musculoskeletal system and connective tissue: Secondary | ICD-10-CM | POA: Insufficient documentation

## 2012-07-29 DIAGNOSIS — Z862 Personal history of diseases of the blood and blood-forming organs and certain disorders involving the immune mechanism: Secondary | ICD-10-CM | POA: Insufficient documentation

## 2012-07-29 DIAGNOSIS — Z8639 Personal history of other endocrine, nutritional and metabolic disease: Secondary | ICD-10-CM | POA: Insufficient documentation
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
## 2012-07-30 LAB — STOOL CULTURE

## 2012-08-04 NOTE — Progress Notes (Signed)
Quick Note:  Pancytopenia noted.  Isolated AST elevation noted in the setting of chronic ETOH.  TSH and stool studies normal.  He was supposed to have EGD/TCS with [facility]. What happened?   If negative TCS/EGD, needs Hematology evaluation due to pancytopenia. Concern for underlying liver disease remains due to ETOH abuse; last [place] Dec 2013 with diffuse fatty liver. ______

## 2012-09-01 ENCOUNTER — Encounter: Payer: Self-pay | Admitting: Physician Assistant

## 2012-09-01 ENCOUNTER — Ambulatory Visit (INDEPENDENT_AMBULATORY_CARE_PROVIDER_SITE_OTHER): Payer: Medicaid Other | Admitting: Physician Assistant

## 2012-09-01 VITALS — BP 146/100 | HR 80 | Temp 98.6°F | Resp 20 | Ht 69.5 in | Wt 134.0 lb

## 2012-09-01 DIAGNOSIS — Z860101 Personal history of adenomatous and serrated colon polyps: Secondary | ICD-10-CM

## 2012-09-01 DIAGNOSIS — K219 Gastro-esophageal reflux disease without esophagitis: Secondary | ICD-10-CM

## 2012-09-01 DIAGNOSIS — R972 Elevated prostate specific antigen [PSA]: Secondary | ICD-10-CM

## 2012-09-01 DIAGNOSIS — R634 Abnormal weight loss: Secondary | ICD-10-CM

## 2012-09-01 DIAGNOSIS — Z86010 Personal history of colonic polyps: Secondary | ICD-10-CM

## 2012-09-01 DIAGNOSIS — F1010 Alcohol abuse, uncomplicated: Secondary | ICD-10-CM

## 2012-09-01 DIAGNOSIS — N400 Enlarged prostate without lower urinary tract symptoms: Secondary | ICD-10-CM

## 2012-09-01 DIAGNOSIS — D72819 Decreased white blood cell count, unspecified: Secondary | ICD-10-CM

## 2012-09-01 DIAGNOSIS — I10 Essential (primary) hypertension: Secondary | ICD-10-CM

## 2012-09-01 DIAGNOSIS — R945 Abnormal results of liver function studies: Secondary | ICD-10-CM

## 2012-09-01 MED ORDER — OMEPRAZOLE 20 MG PO CPDR: 20.0000 mg | DELAYED_RELEASE_CAPSULE | Freq: Every day | ORAL | Status: DC

## 2012-09-01 MED ORDER — AMLODIPINE BESYLATE 10 MG PO TABS: 10.0000 mg | ORAL_TABLET | Freq: Every day | ORAL | Status: DC

## 2012-09-01 NOTE — Progress Notes (Signed)
Patient ID: Duane Richardson MRN: [id number], DOB: February 09, 1956, 57 y.o. Date of Encounter: @DATE @  Chief Complaint:  Chief Complaint  Patient presents with  . c/o no appetite  wants something to help him eat,  wants som    states only med he is taking is zoloft    HPI: 57 y.o. year old A.A. male  presents with his case worker. Reports that he has been having problem for about 4 months. Says that every time he eats anything, even very small amount, he vomits. Says he "has gotten no food down" in past week. Food does not seem to get stuck in his throat. "Just cant keep it down.'  He reports that he has no pain in abdomen at all.  No fever, chills. No hematochezia or melena.    Past Medical History  Diagnosis Date  . Depression   . Seizures   . Elevated PSA   . Arthritis   . Hyperlipidemia   . Cataract   . Anxiety   . Bipolar 1 disorder   . ETOH abuse     quit Oct 2013, then relapsed, as of Dec 2013 now has abstained X 1 month  . COPD (chronic obstructive pulmonary disease)   . Asthma   . Allergy      Home Meds: See attached medication section for current medication list. Any medications entered into computer today will not appear on this note's list. The medications listed below were entered prior to today. Current Outpatient Prescriptions on File Prior to Visit  Medication Sig Dispense Refill  . sertraline (ZOLOFT) 100 MG tablet Take 100 mg by mouth 2 (two) times daily.      Marland Kitchen amLODipine (NORVASC) 10 MG tablet Take 10 mg by mouth daily.      Marland Kitchen omeprazole (PRILOSEC) 20 MG capsule Take 1 capsule (20 mg total) by mouth daily.  30 capsule  2  . polyethylene glycol-electrolytes (TRILYTE) 420 G solution Take 4,000 mLs by mouth as directed.  4000 mL  0   No current facility-administered medications on file prior to visit.    Allergies: No Known Allergies  History   Social History  . Marital Status: Single    Spouse Name: N/A    Number of Children: 0  . Years of Education:  N/A   Occupational History  . disabled    Social History Main Topics  . Smoking status: Current Every Day Smoker -- 1.00 packs/day for 30 years    Types: Cigarettes  . Smokeless tobacco: Not on file  . Alcohol Use: 18.0 oz/week    30 Glasses of wine per week     Comment: No ETOH in 1 week. about a liter of wine per day  . Drug Use: No  . Sexually Active: Not on file   Other Topics Concern  . Not on file   Social History Narrative   In prison x 2, 14rs, 29 mo   Out for several yrs   Lives alone          Family History  Problem Relation Age of Onset  . Colon cancer Neg Hx   . Mental illness Sister      Review of Systems:  See HPI for pertinent ROS. All other ROS negative.    Physical Exam: Blood pressure 146/100, pulse 80, temperature 98.6 F (37 C), temperature source Oral, resp. rate 20, height 5' 9.5" (1.765 m), weight 134 lb (60.782 kg)., Body mass index is 19.51 kg/(m^2). General: Thin  AAM. Appears in no acute distress. Neck: Supple. No thyromegaly. No lymphadenopathy. Lungs: Clear bilaterally to auscultation without wheezes, rales, or rhonchi. Breathing is unlabored. Heart: RRR with S1 S2. No murmurs, rubs, or gallops. Abdomen: Soft, non-tender, non-distended with normoactive bowel sounds. No hepatomegaly. No rebound/guarding. No obvious abdominal masses.No area of tenderness at all with palpation of entire abdomen.  Extremities/Skin: Warm and dry.  No edema.  Neuro: Alert and oriented X 3. Moves all extremities spontaneously. Gait is normal. CNII-XII grossly in tact. Psych:  Responds to questions appropriately with a normal affect.     ASSESSMENT AND PLAN:  57 y.o. year old male with  1. ETOH abuse Known h/o alcohol abuse.  2. Unintentional weight loss - CBC with Differential - COMPLETE METABOLIC PANEL WITH GFR - PSA - TSH Explained that in order to find out what is the problem/what is causing his symtoms, tests must be done. He says he understands and  agrees. I will check labs. Case Manager says he will schedule f/u with [facility] now. If they need furhter referral etc from [place], he will call us. Will resume omeprazole in interim while await f/u with GI. He had OV with GI 07/14/12. They planned EGD and colonoscopy but pt did not f/u with these.   3. GERD - omeprazole (PRILOSEC) 20 MG capsule; Take 1 capsule (20 mg total) by mouth daily.  Dispense: 90 capsule; Refill: 3  4. Hx of adenomatous colonic polyps  5. H/O LEUKOPENIA, MILD - CBC with Differential  6. H/O LIVER FUNCTION TESTS, ABNORMAL - COMPLETE METABOLIC PANEL WITH GFR  7. H/O Elevated PSA Will recheck PSA now.  9/13 PSA was 6.37. He was to f/u with urology but did not. When Dr. Tanya Nones saw him 12/13, he rechecked PSA and again referred him to urology. Repeat PSA at that time wa 5.74.  Today case nmanager says that pt saw urology, was sched for cysto but doctors office had to resched cysto. Pt never f/u with rescheduling this/going for this.  - PSA  8. Enlarged prostate seen on CT scan See # 7 above. - PSA  9. HTN (hypertension) Pt is not currently on his BP med. Will restart norvasc 10, which he was rxed 12/13. - amLODipine (NORVASC) 10 MG tablet; Take 1 tablet (10 mg total) by mouth daily.  Dispense: 90 tablet; Refill: 3  10. Noncompliance: He must take BP med as directed, must f/u with GI tests as directed, must f/u with urology as directed.   Murray Hodgkins [place], [place], [facility] 09/01/2012 3:04 PM

## 2012-09-02 LAB — CBC WITH DIFFERENTIAL/PLATELET
Basophils Absolute: 0 10*3/uL (ref 0.0–0.1)
Basophils Relative: 1 % (ref 0–1)
Eosinophils Absolute: 0 10*3/uL (ref 0.0–0.7)
Eosinophils Relative: 0 % (ref 0–5)
HCT: 35.9 % — ABNORMAL LOW (ref 39.0–52.0)
Hemoglobin: 12.5 g/dL — ABNORMAL LOW (ref 13.0–17.0)
Lymphocytes Relative: 52 % — ABNORMAL HIGH (ref 12–46)
Lymphs Abs: 1.2 10*3/uL (ref 0.7–4.0)
MCH: 33.5 pg (ref 26.0–34.0)
MCHC: 34.8 g/dL (ref 30.0–36.0)
MCV: 96.2 fL (ref 78.0–100.0)
Monocytes Absolute: 0.5 10*3/uL (ref 0.1–1.0)
Monocytes Relative: 22 % — ABNORMAL HIGH (ref 3–12)
Neutro Abs: 0.6 10*3/uL — ABNORMAL LOW (ref 1.7–7.7)
Neutrophils Relative %: 25 % — ABNORMAL LOW (ref 43–77)
Platelets: 62 10*3/uL — ABNORMAL LOW (ref 150–400)
RBC: 3.73 MIL/uL — ABNORMAL LOW (ref 4.22–5.81)
RDW: 17.1 % — ABNORMAL HIGH (ref 11.5–15.5)
WBC: 2.4 10*3/uL — ABNORMAL LOW (ref 4.0–10.5)

## 2012-09-02 LAB — COMPLETE METABOLIC PANEL WITH GFR
ALT: 25 U/L (ref 0–53)
AST: 80 U/L — ABNORMAL HIGH (ref 0–37)
Albumin: 3.8 g/dL (ref 3.5–5.2)
Alkaline Phosphatase: 100 U/L (ref 39–117)
BUN: 4 mg/dL — ABNORMAL LOW (ref 6–23)
CO2: 24 mEq/L (ref 19–32)
Calcium: 8.5 mg/dL (ref 8.4–10.5)
Chloride: 102 mEq/L (ref 96–112)
Creat: 0.62 mg/dL (ref 0.50–1.35)
GFR, Est African American: >89 mL/min
GFR, Est Non African American: >89 mL/min
Glucose, Bld: 75 mg/dL (ref 70–99)
Potassium: 3.6 mEq/L (ref 3.5–5.3)
Sodium: 135 mEq/L (ref 135–145)
Total Bilirubin: 0.7 mg/dL (ref 0.3–1.2)
Total Protein: 6.7 g/dL (ref 6.0–8.3)

## 2012-09-02 LAB — TSH: TSH: 2.228 u[IU]/mL (ref 0.350–4.500)

## 2012-09-02 LAB — PSA: PSA: 10 ng/mL — ABNORMAL HIGH (ref <=4.00)

## 2012-10-05 ENCOUNTER — Other Ambulatory Visit: Payer: Self-pay

## 2012-10-05 ENCOUNTER — Encounter (HOSPITAL_COMMUNITY)
Admission: RE | Admit: 2012-10-05 | Discharge: 2012-10-05 | Disposition: A | Discharge status: Home / Self Care | Payer: Medicaid Other | Source: Ambulatory Visit | Attending: Urology | Admitting: Urology

## 2012-10-05 ENCOUNTER — Encounter (HOSPITAL_COMMUNITY): Payer: Self-pay

## 2012-10-05 DIAGNOSIS — Z01810 Encounter for preprocedural cardiovascular examination: Secondary | ICD-10-CM | POA: Insufficient documentation

## 2012-10-05 DIAGNOSIS — Z01812 Encounter for preprocedural laboratory examination: Secondary | ICD-10-CM | POA: Insufficient documentation

## 2012-10-05 HISTORY — DX: Essential (primary) hypertension: I10

## 2012-10-05 HISTORY — DX: Gastro-esophageal reflux disease without esophagitis: K21.9

## 2012-10-05 LAB — HEMOGLOBIN AND HEMATOCRIT, BLOOD
HCT: 37.8 % — ABNORMAL LOW (ref 39.0–52.0)
Hemoglobin: 13.1 g/dL (ref 13.0–17.0)

## 2012-10-05 LAB — BASIC METABOLIC PANEL
BUN: 3 mg/dL — ABNORMAL LOW (ref 6–23)
CO2: 25 mEq/L (ref 19–32)
Calcium: 8.7 mg/dL (ref 8.4–10.5)
Chloride: 100 mEq/L (ref 96–112)
Creatinine, Ser: 0.63 mg/dL (ref 0.50–1.35)
GFR calc Af Amer: >90 mL/min (ref >90)
GFR calc non Af Amer: >90 mL/min (ref >90)
Glucose, Bld: 83 mg/dL (ref 70–99)
Potassium: 4.2 mEq/L (ref 3.5–5.1)
Sodium: 134 mEq/L — ABNORMAL LOW (ref 135–145)

## 2012-10-05 NOTE — Patient Instructions (Addendum)
Duane Richardson  10/05/2012   Your procedure is scheduled on:  10/09/2012  Report to [facility] at  615  AM.  Call this number if you have problems the morning of surgery: [phone]   Remember:   Do not eat food or drink liquids after midnight.   Take these medicines the morning of surgery with A SIP OF WATER: norvasc, prilosec, zoloft   Do not wear jewelry, make-up or nail polish.  Do not wear lotions, powders, or perfumes.   Do not shave 48 hours prior to surgery. Men may shave face and neck.  Do not bring valuables to the hospital.  [facility] is not responsible for any belongings or valuables.  Contacts, dentures or bridgework may not be worn into surgery.  Leave suitcase in the car. After surgery it may be brought to your room.  For patients admitted to the hospital, checkout time is 11:00 AM the day of discharge.   Patients discharged the day of surgery will not be allowed to drive home.  Name and phone number of your driver: family  Special Instructions: Shower using CHG 2 nights before surgery and the night before surgery.  If you shower the day of surgery use CHG.  Use special wash - you have one bottle of CHG for all showers.  You should use approximately 1/3 of the bottle for each shower.   Please read over the following fact sheets that you were given: Pain Booklet, Coughing and Deep Breathing, Surgical Site Infection Prevention, Anesthesia Post-op Instructions and Care and Recovery After Surgery Cystoscopy Cystoscopy is a procedure that is used to help your caregiver diagnose and sometimes treat conditions that affect your lower urinary tract. Your lower urinary tract includes your bladder and the tube through which urine passes from your bladder out of your body (urethra). Cystoscopy is performed with a thin, tube-shaped instrument (cystoscope). The cystoscope has lenses and a light at the end so that your caregiver can see inside your bladder. The cystoscope is inserted  at the entrance of your urethra. Your caregiver guides it through your urethra and into your bladder. There are two main types of cystoscopy:  Flexible cystoscopy (with a flexible cystoscope).  Rigid cystoscopy (with a rigid cystoscope). Cystoscopy may be recommended for many conditions, including:  Urinary tract infections.  Blood in your urine (hematuria).  Loss of bladder control (urinary incontinence) or overactive bladder.  Unusual cells found in a urine sample.  Urinary blockage.  Painful urination. Cystoscopy may also be done to remove a sample of your tissue to be checked under a microscope (biopsy). It may also be done to remove or destroy bladder stones. LET YOUR CAREGIVER KNOW ABOUT:  Allergies to food or medicine.  Medicines taken, including vitamins, herbs, eyedrops, over-the-counter medicines, and creams.  Use of steroids (by mouth or creams).  Previous problems with anesthetics or numbing medicines.  History of bleeding problems or blood clots.  Previous surgery.  Other health problems, including diabetes and kidney problems.  Possibility of pregnancy, if this applies. PROCEDURE The area around the opening to your urethra will be cleaned. A medicine to numb your urethra (local anesthetic) is used. If a tissue sample or stone is removed during the procedure, you may be given a medicine to make you sleep (general anesthetic). Your caregiver will gently insert the tip of the cystoscope into your urethra. The cystoscope will be slowly glided through your urethra and into your bladder. Sterile fluid  will flow through the cystoscope and into your bladder. The fluid will expand and stretch your bladder. This gives your caregiver a better view of your bladder walls. The procedure lasts about 15 20 minutes. AFTER THE PROCEDURE If a local anesthetic is used, you will be allowed to go home as soon as you are ready. If a general anesthetic is used, you will be taken to a  recovery area until you are stable. You may have temporary bleeding and burning on urination. Document Released: 02/08/2000 Document Revised: 11/05/2011 Document Reviewed: 08/04/2011 [facility] Patient Information 2014 [employer], [place]. PATIENT INSTRUCTIONS POST-ANESTHESIA  IMMEDIATELY FOLLOWING SURGERY:  Do not drive or operate machinery for the first twenty four hours after surgery.  Do not make any important decisions for twenty four hours after surgery or while taking narcotic pain medications or sedatives.  If you develop intractable nausea and vomiting or a severe headache please notify your doctor immediately.  FOLLOW-UP:  Please make an appointment with your surgeon as instructed. You do not need to follow up with anesthesia unless specifically instructed to do so.  WOUND CARE INSTRUCTIONS (if applicable):  Keep a dry clean dressing on the anesthesia/puncture wound site if there is drainage.  Once the wound has quit draining you may leave it open to air.  Generally you should leave the bandage intact for twenty four hours unless there is drainage.  If the epidural site drains for more than 36-48 hours please call the anesthesia department.  QUESTIONS?:  Please feel free to call your physician or the hospital operator if you have any questions, and they will be happy to assist you.

## 2012-10-12 ENCOUNTER — Encounter (HOSPITAL_COMMUNITY): Payer: Self-pay | Admitting: Anesthesiology

## 2012-10-12 ENCOUNTER — Encounter (HOSPITAL_COMMUNITY)
Admission: RE | Disposition: A | Discharge status: Other Acute Inpatient Hospital | Payer: Self-pay | Source: Ambulatory Visit | Attending: Urology

## 2012-10-12 ENCOUNTER — Encounter (HOSPITAL_COMMUNITY): Payer: Self-pay | Admitting: *Deleted

## 2012-10-12 ENCOUNTER — Ambulatory Visit (HOSPITAL_COMMUNITY)
Admission: RE | Admit: 2012-10-12 | Discharge: 2012-10-12 | Disposition: A | Discharge status: Other Acute Inpatient Hospital | Payer: Medicaid Other | Source: Ambulatory Visit | Attending: Emergency Medicine | Admitting: Emergency Medicine

## 2012-10-12 ENCOUNTER — Emergency Department (HOSPITAL_COMMUNITY)
Admission: EM | Admit: 2012-10-12 | Discharge: 2012-10-12 | Disposition: A | Discharge status: Home / Self Care | Payer: Medicaid Other | Attending: Emergency Medicine | Admitting: Emergency Medicine

## 2012-10-12 ENCOUNTER — Other Ambulatory Visit: Payer: Self-pay

## 2012-10-12 ENCOUNTER — Encounter (HOSPITAL_COMMUNITY): Payer: Self-pay

## 2012-10-12 DIAGNOSIS — I10 Essential (primary) hypertension: Secondary | ICD-10-CM

## 2012-10-12 DIAGNOSIS — K219 Gastro-esophageal reflux disease without esophagitis: Secondary | ICD-10-CM | POA: Insufficient documentation

## 2012-10-12 DIAGNOSIS — Z79899 Other long term (current) drug therapy: Secondary | ICD-10-CM | POA: Insufficient documentation

## 2012-10-12 DIAGNOSIS — F17200 Nicotine dependence, unspecified, uncomplicated: Secondary | ICD-10-CM | POA: Insufficient documentation

## 2012-10-12 DIAGNOSIS — Z5309 Procedure and treatment not carried out because of other contraindication: Secondary | ICD-10-CM | POA: Insufficient documentation

## 2012-10-12 DIAGNOSIS — F319 Bipolar disorder, unspecified: Secondary | ICD-10-CM | POA: Insufficient documentation

## 2012-10-12 DIAGNOSIS — R310 Gross hematuria: Secondary | ICD-10-CM | POA: Insufficient documentation

## 2012-10-12 DIAGNOSIS — Z8639 Personal history of other endocrine, nutritional and metabolic disease: Secondary | ICD-10-CM | POA: Insufficient documentation

## 2012-10-12 DIAGNOSIS — F411 Generalized anxiety disorder: Secondary | ICD-10-CM | POA: Insufficient documentation

## 2012-10-12 DIAGNOSIS — R000 Tachycardia, unspecified: Secondary | ICD-10-CM | POA: Insufficient documentation

## 2012-10-12 DIAGNOSIS — Z8669 Personal history of other diseases of the nervous system and sense organs: Secondary | ICD-10-CM | POA: Insufficient documentation

## 2012-10-12 DIAGNOSIS — M129 Arthropathy, unspecified: Secondary | ICD-10-CM | POA: Insufficient documentation

## 2012-10-12 DIAGNOSIS — J449 Chronic obstructive pulmonary disease, unspecified: Secondary | ICD-10-CM | POA: Insufficient documentation

## 2012-10-12 DIAGNOSIS — J4489 Other specified chronic obstructive pulmonary disease: Secondary | ICD-10-CM | POA: Insufficient documentation

## 2012-10-12 DIAGNOSIS — Z862 Personal history of diseases of the blood and blood-forming organs and certain disorders involving the immune mechanism: Secondary | ICD-10-CM | POA: Insufficient documentation

## 2012-10-12 SURGERY — CYSTOSCOPY
Anesthesia: Choice

## 2012-10-12 MED ORDER — AMLODIPINE BESYLATE 5 MG PO TABS: 10.0000 mg | ORAL_TABLET | Freq: Once | ORAL | Status: AC

## 2012-10-12 MED ORDER — LORAZEPAM 1 MG PO TABS
ORAL_TABLET | ORAL | Status: AC
  Administered 2012-10-12: 1 mg via ORAL
  Filled 2012-10-12: qty 1

## 2012-10-12 MED ORDER — LORAZEPAM 1 MG PO TABS: 1.0000 mg | ORAL_TABLET | Freq: Once | ORAL | Status: AC

## 2012-10-12 MED ORDER — AMLODIPINE BESYLATE 5 MG PO TABS
ORAL_TABLET | ORAL | Status: AC
  Filled 2012-10-12: qty 2

## 2012-10-12 SURGICAL SUPPLY — 17 items
BAG DRAIN URO TABLE W/ADPT NS (DRAPE) ×3 IMPLANT
BAG DRN 8 ADPR NS SKTRN CSTL (DRAPE) ×1
BAG HAMPER (MISCELLANEOUS) ×3 IMPLANT
CATH FOLEY 2WAY SLVR  5CC 18FR (CATHETERS)
CATH FOLEY 2WAY SLVR 5CC 18FR (CATHETERS) IMPLANT
CLOTH BEACON ORANGE TIMEOUT ST (SAFETY) ×3 IMPLANT
GLOVE BIO SURGEON STRL SZ7 (GLOVE) ×3 IMPLANT
GOWN STRL REIN XL XLG (GOWN DISPOSABLE) ×3 IMPLANT
IV NS IRRIG 3000ML ARTHROMATIC (IV SOLUTION) ×3 IMPLANT
KIT ROOM TURNOVER AP CYSTO (KITS) ×3 IMPLANT
MANIFOLD NEPTUNE II (INSTRUMENTS) ×3 IMPLANT
PACK CYSTO (CUSTOM PROCEDURE TRAY) ×3 IMPLANT
PAD ARMBOARD 7.5X6 YLW CONV (MISCELLANEOUS) ×3 IMPLANT
SET IRRIGATING DISP (SET/KITS/TRAYS/PACK) ×3 IMPLANT
SYRINGE IRR TOOMEY STRL 70CC (SYRINGE) IMPLANT
TOWEL OR 17X26 4PK STRL BLUE (TOWEL DISPOSABLE) ×3 IMPLANT
WATER STERILE IRR 1000ML POUR (IV SOLUTION) ×3 IMPLANT

## 2012-10-12 NOTE — Anesthesia Preprocedure Evaluation (Deleted)
Anesthesia Evaluation Anesthesia Physical Anesthesia Plan Anesthesia Quick Evaluation  

## 2012-10-12 NOTE — ED Notes (Signed)
EKG completed on pt and given to MD. Pt repositioned in bed. Pt was given a cup of ice water.

## 2012-10-12 NOTE — ED Provider Notes (Addendum)
CSN: [id number]     Arrival date & time 10/12/12  5366 History    This chart was scribed for Shelda Jakes, MD,  by Ashley Jacobs, ED Scribe. The patient was seen in room APA18/APA18 and the patient's care was started at 8:32 AM.    Chief Complaint  Patient presents with  . Hypertension   (Consider location/radiation/quality/duration/timing/severity/associated sxs/prior Treatment) HPI HPI Comments: Duane Richardson is a 57 y.o. male who arrived by stretcher from the OR presents to the Emergency Department complaining of hypertension. He reports baseline tremors in his left arm. Pt was scheduled to have a Cystoscopy performed today by Javid at Endoscopy. It was noted that he was tachycardic at 200, BP was elevated at 158/113 and elevated levels of PSA. Pt has a hx of intermittent  tremors at baseline. He denies CP, palpitation, sob, headache or any stroke like symptoms. Upon arrival the pt's HR is 85. Pt has a hx of hypertension and reports that he did not take his medications as requested by his physician. Pt's friend mentions that his tremors increases whenever the pt is nervous or at the doctors. Pt takes 10 mg of Norvasc each morning. Pt has a hx of elevated PTA, anxiety, COPD, and hypertension.  Pt PCP is Dr. Tanya Nones Past Medical History   Past Medical History  Diagnosis Date  . Depression   . Elevated PSA   . Arthritis   . Hyperlipidemia   . Cataract   . Anxiety   . Bipolar 1 disorder   . ETOH abuse     quit Oct 2013, then relapsed, as of Dec 2013 now has abstained X 1 month  . COPD (chronic obstructive pulmonary disease)   . Asthma   . Allergy   . Hypertension   . GERD (gastroesophageal reflux disease)    Past Surgical History  Procedure Laterality Date  . Colonoscopy  12/21/09    YQI:HKVQ papilla otherwise normal/ pancolonic diverticula/mutiple colonic poylps  . Spine surgery    . Back surgery      lunbar   Family History  Problem Relation Age of Onset  . Colon  cancer Neg Hx   . Mental illness Sister    History  Substance Use Topics  . Smoking status: Current Every Day Smoker -- 1.00 packs/day for 30 years    Types: Cigarettes  . Smokeless tobacco: Not on file  . Alcohol Use: 18.0 oz/week    30 Glasses of wine per week     Comment: No ETOH in 2 weeks . about a liter of wine per day-before that    Review of Systems   Review of Systems  Constitutional: Negative for fever and chills.  HENT: Negative for sore throat and rhinorrhea.  Eyes: Negative for visual disturbance.  Respiratory: Negative for cough, shortness of breath and wheezing.  Cardiovascular: Negative for chest pain and leg swelling.  Gastrointestinal: Negative for nausea, vomiting, abdominal pain and diarrhea.  Genitourinary: Positive for hematuria. Negative for dysuria.  Rectal bleeding  Musculoskeletal: Negative for back pain.  Skin: Negative for rash.  Neurological: Positive for tremors and headaches.  Hematological: Does not bruise/bleed easily.  Psychiatric/Behavioral: Negative for confusion.  All other systems reviewed and are negative.    Allergies  Review of patient's allergies indicates no known allergies.  Home Medications   Current Outpatient Rx  Name  Route  Sig  Dispense  Refill  . amLODipine (NORVASC) 10 MG tablet   Oral   Take 1 tablet (  10 mg total) by mouth daily.   90 tablet   3   . omeprazole (PRILOSEC) 20 MG capsule   Oral   Take 1 capsule (20 mg total) by mouth daily.   90 capsule   3   . sertraline (ZOLOFT) 100 MG tablet   Oral   Take 100 mg by mouth 2 (two) times daily.          BP 151/94  Pulse 77  Temp(Src) 98.4 F (36.9 C) (Oral)  Resp 15  Ht 6\' 1"  (1.854 m)  Wt 136 lb (61.689 kg)  BMI 17.95 kg/m2  SpO2 99% Physical Exam  Nursing note and vitals reviewed. Constitutional: He is oriented to person, place, and time. He appears well-developed and well-nourished. No distress.  HENT:  Head: Normocephalic and atraumatic.   Mouth/Throat: Oropharynx is clear and moist.  Eyes: Conjunctivae are normal. Pupils are equal, round, and reactive to light. No scleral icterus.  Neck: Neck supple.  Cardiovascular: Normal rate, regular rhythm, normal heart sounds and intact distal pulses.   No murmur heard. Pulmonary/Chest: Effort normal and breath sounds normal. No stridor. No respiratory distress. He has no wheezes. He has no rales.  Abdominal: Soft. He exhibits no distension. There is no tenderness.  Musculoskeletal: Normal range of motion. He exhibits no edema.  Neurological: He is alert and oriented to person, place, and time.  Skin: Skin is warm and dry. No rash noted.  Psychiatric: He has a normal mood and affect. His behavior is normal.    ED Course  DIAGNOSTIC STUDIES: Oxygen Saturation is 99% on [place], normal by my interpretation.    COORDINATION OF CARE: 8:29 Discussed course of care with pt . Pt understands and agrees.  10:02 AM Pt's BP has improved to 154/99.  Procedures (including critical care time)  Labs Reviewed - No data to display No results found.   Date: 10/12/2012  Rate: 85  Rhythm: normal sinus rhythm  QRS Axis: normal  Intervals: QT prolonged  ST/T Wave abnormalities: normal  Conduction Disutrbances:none  Narrative Interpretation:   Old EKG Reviewed: none available    1. Hypertension     MDM  Patient sent over from the Endo lab. 4 rapid heart rate and high blood pressure. Patient was due to have cystoscopy done today. Patient did not take his blood pressure medicine today we gave him Nizoral blood pressure medicine here in the emergency department. Patient's EKG is not tachycardic he occasionally has tremors which is baseline for him and it isn't artificial appearance on the monitor for tachycardia. Patient has no specific new or worse complaints today. Following his lisinopril blood pressure improved to 154/99. Patient without any significant tachycardia on the cardiac  monitor.     I personally performed the services described in this documentation, which was scribed in my presence. The recorded information has been reviewed and is accurate.      Shelda Jakes, MD 10/12/12 1043is  Shelda Jakes, MD 10/12/12 1044

## 2012-10-12 NOTE — ED Notes (Signed)
Unable to room pt due to OR encounter being locked.

## 2012-10-12 NOTE — Progress Notes (Signed)
Drs Jayme Cloud and Jerre Simon in to assess pt. Repeat EKG done. Surgery cancelled due to elevated BP and HR.

## 2012-10-12 NOTE — H&P (Signed)
NAMEKOBIE, WHIDBY NO.:  [id number]  MEDICAL RECORD NO.:  [id number]  LOCATION:                                 FACILITY:  PHYSICIAN:  Ky Barban, M.D.DATE OF BIRTH:  04/14/1955  DATE OF ADMISSION:  10/12/2012 DATE OF DISCHARGE:  LH                             HISTORY & PHYSICAL   CHIEF COMPLAINT:  History of gross hematuria.  HISTORY OF PRESENT ILLNESS:  This is a 57 year old gentleman, who was seen by me in March 03, 2012, with history of gross hematuria for about a month ago.  No history of since similar problem in the past.  He has nocturia x4.  No fever, stream is satisfactory.  He was there last time, I could not do a cystoscopy in the office he was drunk.  So, I decided to cystoscopy here in the office under local anesthesia.  PAST MEDICAL HISTORY:  Hypertension and takes medicine.  No diabetes. Long time ago had back surgery.  PERSONAL HISTORY:  He does not smoke or drink.  FAMILY HISTORY:  No history of prostate cancer.  REVIEW OF SYSTEMS:  Unremarkable.  PHYSICAL EXAMINATION:  Vital Signs:  His blood pressure 133/83, temperature 97 5. CENTRAL NERVOUS:  No gross neurological deficit. HEAD, NECK, EYE:  Negative. CHEST:  Symmetrical.  Normal breath sounds. HEART:  Regular sinus rhythm.  No murmur. ABDOMEN:  Soft, flat.  Liver, spleen, and kidneys are not palpable.  No CVA tenderness. GU:  External genitalia is unremarkable.  Testicles are normal. RECTAL:  Sphincter tone is normal.  No rectal mass.  Prostate 2+ smooth and firm.  LABORATORY DATA:  Urinalysis looks normal.  IMPRESSION AND PLAN:  Gross hematuria.  He already had a CT scan with and without contrast, which is negative.  He does have a small 1-2 mm nonobstructing calculus in the left kidney.  His PSA is slightly elevated to 7.6 on March 04, 2012.  My free PSA looks very good. 2- 31.8, so I will do cystoscopy under local anesthesia as outpatient in the  hospital.     Ky Barban, M.D.     MIJ/MEDQ  D:  10/11/2012  T:  10/11/2012  Job:  [id number]

## 2012-10-12 NOTE — Progress Notes (Signed)
Pt has left arm shakes, high BP, HR. Pt and Child psychotherapist both state this is normal for him. Dr Jayme Cloud informed.

## 2012-10-12 NOTE — ED Notes (Signed)
Pt arrived by stretcher  From the or.  Was scheduled for surgery (cysto) today and his bp was high and he had a high heart rate. Pt has not taken any of his usual meds. Npo since 8pm yesterday.  Left arm also shaky, pt stated that was normal for him.

## 2012-10-12 NOTE — Progress Notes (Signed)
Procedure is cancelled his bp is not controlled 159/113will send him to er for further evaluation.

## 2012-10-12 NOTE — Progress Notes (Signed)
Pt transferred to ED via stretcher.

## 2012-10-13 ENCOUNTER — Ambulatory Visit (INDEPENDENT_AMBULATORY_CARE_PROVIDER_SITE_OTHER): Payer: Medicaid Other | Admitting: Gastroenterology

## 2012-10-13 ENCOUNTER — Other Ambulatory Visit: Payer: Self-pay | Admitting: Internal Medicine

## 2012-10-13 ENCOUNTER — Encounter: Payer: Self-pay | Admitting: Gastroenterology

## 2012-10-13 ENCOUNTER — Encounter (HOSPITAL_COMMUNITY): Payer: Self-pay | Admitting: Pharmacy Technician

## 2012-10-13 VITALS — BP 125/88 | HR 109 | Temp 97.6°F | Ht 73.0 in | Wt 132.8 lb

## 2012-10-13 DIAGNOSIS — R634 Abnormal weight loss: Secondary | ICD-10-CM

## 2012-10-13 DIAGNOSIS — Z860101 Personal history of adenomatous and serrated colon polyps: Secondary | ICD-10-CM

## 2012-10-13 DIAGNOSIS — Z86010 Personal history of colonic polyps: Secondary | ICD-10-CM

## 2012-10-13 DIAGNOSIS — F5000 Anorexia nervosa, unspecified: Secondary | ICD-10-CM

## 2012-10-13 DIAGNOSIS — D696 Thrombocytopenia, unspecified: Secondary | ICD-10-CM

## 2012-10-13 DIAGNOSIS — R195 Other fecal abnormalities: Secondary | ICD-10-CM

## 2012-10-13 MED ORDER — PEG 3350-KCL-NA BICARB-NACL 420 G PO SOLR: 4000.0000 mL | ORAL | Status: DC

## 2012-10-13 NOTE — Assessment & Plan Note (Signed)
History of multiple tubular adenomas removed in 2001. More loose stools since April or May of this year. Denies any blood in the stool. Patient was unable to have this procedure back in May but is ready to reschedule at this time. We will augment conscious sedation with Phenergan 25 mg IV 30 minutes before the procedure due to his history of alcohol abuse.  I have discussed the risks, alternatives, benefits with regards to but not limited to the risk of reaction to medication, bleeding, infection, perforation and the patient is agreeable to proceed. Written consent to be obtained.  He is in the process of having a cystoscopy rescheduled as well. He will notify us if there is any conflicts.

## 2012-10-13 NOTE — Assessment & Plan Note (Signed)
Thrombocytopenia, leukopenia. No evidence of splenomegaly or cirrhosis on CT scan, fatty liver seen. Chronic alcohol abuse. Await colonoscopy and upper endoscopy findings. May require hematology evaluation.

## 2012-10-13 NOTE — Progress Notes (Signed)
cc'd to pcp 

## 2012-10-13 NOTE — Assessment & Plan Note (Signed)
Continued weight loss as noted above. Complains of early satiety, anorexia but otherwise no complaints. Plan for EGD at time of colonoscopy.  I have discussed the risks, alternatives, benefits with regards to but not limited to the risk of reaction to medication, bleeding, infection, perforation and the patient is agreeable to proceed. Written consent to be obtained.

## 2012-10-13 NOTE — Progress Notes (Signed)
Primary Care Physician:  PICKARD,WARREN TOM, MD  Primary Gastroenterologist:  Michael Rourk, MD   Chief Complaint  Patient presents with  . Colonoscopy    HPI:  Duane Richardson is a 57 y.o. male here to reschedule colonoscopy and upper endoscopy. He was seen in may of 2014 to reschedule procedures that will plan for last fall. Treated for oral candida last year with resolution of dysphagia/odynophagia. Last colonoscopy in 2011 with multiple tubular adenomas removed. CT scan in January 2014 showed marked prostamegaly and fatty liver but no evidence of splenomegaly or cirrhosis. He has history of leukopenia and thrombocytopenia.   Appetite off/on. No dysphagia/odynophagia now. No abdominal pain. BM continue to be loose. 2 per day. No melena, brbpr. No hematuria. Urinary frequency. No etoh in one month. No drugs. Occasional BC powder. Weight continues to fall. Down another 5 pounds. He weighed 160 pounds in February 2013. 137 pounds in May of 2014.  History prostamegaly, elevated PSA. Was scheduled for cystoscopy yesterday but was sent to the emergency department due to hypertension last tachycardia (felt to be false secondary to tremor). He was asymptomatic. He is not sure when his cystoscopy is going to be rescheduled.  Current Outpatient Prescriptions  Medication Sig Dispense Refill  . amLODipine (NORVASC) 10 MG tablet Take 1 tablet (10 mg total) by mouth daily.  90 tablet  3  . omeprazole (PRILOSEC) 20 MG capsule Take 1 capsule (20 mg total) by mouth daily.  90 capsule  3  . sertraline (ZOLOFT) 100 MG tablet Take 100 mg by mouth 2 (two) times daily.       No current facility-administered medications for this visit.    Allergies as of 10/13/2012  . (No Known Allergies)    Past Medical History  Diagnosis Date  . Depression   . Elevated PSA   . Arthritis   . Hyperlipidemia   . Cataract   . Anxiety   . Bipolar 1 disorder   . ETOH abuse     quit Oct 2013, then relapsed, as of Dec  2013 now has abstained X 1 month  . COPD (chronic obstructive pulmonary disease)   . Asthma   . Allergy   . Hypertension   . GERD (gastroesophageal reflux disease)     Past Surgical History  Procedure Laterality Date  . Colonoscopy  12/21/09    RMR:anal papilla otherwise normal/ pancolonic diverticula/mutiple colonic poylps  . Spine surgery    . Back surgery      lunbar    Family History  Problem Relation Age of Onset  . Colon cancer Neg Hx   . Mental illness Sister     History   Social History  . Marital Status: Single    Spouse Name: N/A    Number of Children: 0  . Years of Education: N/A   Occupational History  . disabled    Social History Main Topics  . Smoking status: Current Every Day Smoker -- 1.00 packs/day for 30 years    Types: Cigarettes  . Smokeless tobacco: Not on file  . Alcohol Use: 18.0 oz/week    30 Glasses of wine per week     Comment: No ETOH in 2 weeks . about a liter of wine per day-before that  . Drug Use: No  . Sexual Activity: Yes    Birth Control/ Protection: None   Other Topics Concern  . Not on file   Social History Narrative   In prison x 2, 14rs, 29   mo   Out for several yrs   Lives alone            ROS:  General: Negative for fever, chills, fatigue, weakness. Continued weight loss, additional 5 pounds since May. Positive anorexia Eyes: Negative for vision changes.  ENT: Negative for hoarseness, difficulty swallowing , nasal congestion. CV: Negative for chest pain, angina, palpitations, dyspnea on exertion, peripheral edema.  Respiratory: Negative for dyspnea at rest, dyspnea on exertion, cough, sputum, wheezing.  GI: See history of present illness. GU:  Negative for dysuria, hematuria, urinary incontinence. Positive urinary frequency, nocturnal urination.  MS: Negative for joint pain, low back pain.  Derm: Negative for rash or itching.  Neuro: Negative for weakness, abnormal sensation, seizure, frequent headaches, memory  loss, confusion. Positive tremor, chronic Psych: Negative for anxiety, depression, suicidal ideation, hallucinations.  Endo: Ongoing weight loss, additional 5 pounds since May..  Heme: Negative for bruising or bleeding. Allergy: Negative for rash or hives.    Physical Examination:  BP 125/88  Pulse 109  Temp(Src) 97.6 F (36.4 C) (Oral)  Ht 6' 1" (1.854 m)  Wt 132 lb 12.8 oz (60.238 kg)  BMI 17.52 kg/m2   General: Thin, well-developed in no acute distress.  Head: Normocephalic, atraumatic.   Eyes: Conjunctiva pink, no icterus. Mouth: Oropharyngeal mucosa moist and pink , no lesions erythema or exudate. Neck: Supple without thyromegaly, masses, or lymphadenopathy.  Lungs: Clear to auscultation bilaterally.  Heart: Regular rate and rhythm, no murmurs rubs or gallops.  Abdomen: Bowel sounds are normal, nontender, nondistended, no hepatosplenomegaly or masses, no abdominal bruits or    hernia , no rebound or guarding.   Rectal: Deferred Extremities: No lower extremity edema. No clubbing or deformities.  Neuro: Alert and oriented x 4 , grossly normal neurologically.  Skin: Warm and dry, no rash or jaundice.   Psych: Alert and cooperative, normal mood and affect.  Labs: Lab Results  Component Value Date   CREATININE 0.63 10/05/2012   BUN 3* 10/05/2012   NA 134* 10/05/2012   K 4.2 10/05/2012   CL 100 10/05/2012   CO2 25 10/05/2012   Lab Results  Component Value Date   WBC 2.4* 09/01/2012   HGB 13.1 10/05/2012   HCT 37.8* 10/05/2012   MCV 96.2 09/01/2012   PLT 62* 09/01/2012   Lab Results  Component Value Date   PSA 10.00* 09/01/2012   PSA 6.37 11/15/2011   PSA 2.17 07/08/2007   Lab Results  Component Value Date   TSH 2.228 09/01/2012      Imaging Studies: No results found.    

## 2012-10-13 NOTE — Patient Instructions (Addendum)
1. We have scheduled you for an upper endoscopy and colonoscopy with Dr. Jena Gauss. Please see separate instructions.

## 2012-10-20 ENCOUNTER — Encounter (HOSPITAL_COMMUNITY): Payer: Self-pay | Admitting: *Deleted

## 2012-10-20 ENCOUNTER — Telehealth: Payer: Self-pay | Admitting: Internal Medicine

## 2012-10-20 ENCOUNTER — Ambulatory Visit (HOSPITAL_COMMUNITY)
Admission: RE | Admit: 2012-10-20 | Discharge: 2012-10-20 | Disposition: A | Discharge status: Home / Self Care | Payer: Medicaid Other | Source: Ambulatory Visit | Attending: Internal Medicine | Admitting: Internal Medicine

## 2012-10-20 ENCOUNTER — Encounter (HOSPITAL_COMMUNITY)
Admission: RE | Disposition: A | Discharge status: Home / Self Care | Payer: Self-pay | Source: Ambulatory Visit | Attending: Internal Medicine

## 2012-10-20 DIAGNOSIS — J449 Chronic obstructive pulmonary disease, unspecified: Secondary | ICD-10-CM | POA: Insufficient documentation

## 2012-10-20 DIAGNOSIS — K269 Duodenal ulcer, unspecified as acute or chronic, without hemorrhage or perforation: Secondary | ICD-10-CM | POA: Insufficient documentation

## 2012-10-20 DIAGNOSIS — K5730 Diverticulosis of large intestine without perforation or abscess without bleeding: Secondary | ICD-10-CM | POA: Insufficient documentation

## 2012-10-20 DIAGNOSIS — Z860100 Personal history of colon polyps, unspecified: Secondary | ICD-10-CM | POA: Insufficient documentation

## 2012-10-20 DIAGNOSIS — K449 Diaphragmatic hernia without obstruction or gangrene: Secondary | ICD-10-CM

## 2012-10-20 DIAGNOSIS — R634 Abnormal weight loss: Secondary | ICD-10-CM | POA: Insufficient documentation

## 2012-10-20 DIAGNOSIS — Z1211 Encounter for screening for malignant neoplasm of colon: Secondary | ICD-10-CM

## 2012-10-20 DIAGNOSIS — J4489 Other specified chronic obstructive pulmonary disease: Secondary | ICD-10-CM | POA: Insufficient documentation

## 2012-10-20 DIAGNOSIS — K319 Disease of stomach and duodenum, unspecified: Secondary | ICD-10-CM

## 2012-10-20 DIAGNOSIS — Z681 Body mass index (BMI) 19 or less, adult: Secondary | ICD-10-CM | POA: Insufficient documentation

## 2012-10-20 DIAGNOSIS — I10 Essential (primary) hypertension: Secondary | ICD-10-CM | POA: Insufficient documentation

## 2012-10-20 DIAGNOSIS — R627 Adult failure to thrive: Secondary | ICD-10-CM | POA: Insufficient documentation

## 2012-10-20 DIAGNOSIS — F5000 Anorexia nervosa, unspecified: Secondary | ICD-10-CM

## 2012-10-20 HISTORY — PX: COLONOSCOPY WITH ESOPHAGOGASTRODUODENOSCOPY (EGD): SHX5779

## 2012-10-20 SURGERY — COLONOSCOPY WITH ESOPHAGOGASTRODUODENOSCOPY (EGD)
Anesthesia: Moderate Sedation

## 2012-10-20 MED ORDER — MIDAZOLAM HCL 5 MG/5ML IJ SOLN
INTRAMUSCULAR | Status: DC | PRN
  Administered 2012-10-20 (×2): 2 mg via INTRAVENOUS

## 2012-10-20 MED ORDER — STERILE WATER FOR IRRIGATION IR SOLN
Status: DC | PRN
  Administered 2012-10-20: 10:00:00

## 2012-10-20 MED ORDER — PROMETHAZINE HCL 25 MG/ML IJ SOLN
25.0000 mg | Freq: Once | INTRAMUSCULAR | Status: AC
  Administered 2012-10-20: 25 mg via INTRAVENOUS

## 2012-10-20 MED ORDER — SODIUM CHLORIDE 0.9 % IJ SOLN
INTRAMUSCULAR | Status: AC
  Filled 2012-10-20: qty 10

## 2012-10-20 MED ORDER — MIDAZOLAM HCL 5 MG/5ML IJ SOLN
INTRAMUSCULAR | Status: AC
  Filled 2012-10-20: qty 10

## 2012-10-20 MED ORDER — MEPERIDINE HCL 100 MG/ML IJ SOLN
INTRAMUSCULAR | Status: DC | PRN
  Administered 2012-10-20 (×2): 50 mg via INTRAVENOUS

## 2012-10-20 MED ORDER — PROMETHAZINE HCL 25 MG/ML IJ SOLN
INTRAMUSCULAR | Status: AC
  Filled 2012-10-20: qty 1

## 2012-10-20 MED ORDER — BUTAMBEN-TETRACAINE-BENZOCAINE 2-2-14 % EX AERO
INHALATION_SPRAY | CUTANEOUS | Status: DC | PRN
  Administered 2012-10-20: 2 via TOPICAL

## 2012-10-20 MED ORDER — SODIUM CHLORIDE 0.9 % IV SOLN
INTRAVENOUS | Status: DC
  Administered 2012-10-20: 10:00:00 via INTRAVENOUS

## 2012-10-20 MED ORDER — MEPERIDINE HCL 100 MG/ML IJ SOLN
INTRAMUSCULAR | Status: AC
  Filled 2012-10-20: qty 2

## 2012-10-20 MED ORDER — ONDANSETRON HCL 4 MG/2ML IJ SOLN
INTRAMUSCULAR | Status: AC
  Filled 2012-10-20: qty 2

## 2012-10-20 MED ORDER — ONDANSETRON HCL 4 MG/2ML IJ SOLN
INTRAMUSCULAR | Status: DC | PRN
  Administered 2012-10-20: 4 mg via INTRAVENOUS

## 2012-10-20 NOTE — Interval H&P Note (Signed)
History and Physical Interval Note:  10/20/2012 10:27 AM  Duane Richardson  has presented today for surgery, with the diagnosis of COLON POLYPS AND WEIGHT LOSS AND ANOREXIA  The various methods of treatment have been discussed with the patient and family. After consideration of risks, benefits and other options for treatment, the patient has consented to  Procedure(s) with comments: COLONOSCOPY WITH ESOPHAGOGASTRODUODENOSCOPY (EGD) (N/A) - 10;15 as a surgical intervention .  The patient's history has been reviewed, patient examined, no change in status, stable for surgery.  I have reviewed the patient's chart and labs.  Questions were answered to the patient's satisfaction.     Eula Listen  Patient seen and examined. No change.The risks, benefits, limitations, imponderables and alternatives regarding both EGD and colonoscopy have been reviewed with the patient. Questions have been answered. All parties agreeable.

## 2012-10-20 NOTE — Telephone Encounter (Signed)
Patient was a no show for his procedure in Endo today and I have tried to reach him by phone an no correct number to have him R/S

## 2012-10-20 NOTE — Op Note (Signed)
[facility] [address] [place], [address]   COLONOSCOPY PROCEDURE REPORT  PATIENT: Duane, Richardson  MR#:         [id number] BIRTHDATE: 1955-05-22 , 57  yrs. old GENDER: Male ENDOSCOPIST: R.  Roetta Sessions, MD FACP FACG REFERRED BY:  Lynnea Ferrier, M.D. PROCEDURE DATE:  10/20/2012 PROCEDURE:     Colonoscopy-surveillance  INDICATIONS: distant history of multiple colonic adenomas  INFORMED CONSENT:  The risks, benefits, alternatives and imponderables including but not limited to bleeding, perforation as well as the possibility of a missed lesion have been reviewed.  The potential for biopsy, lesion removal, etc. have also been discussed.  Questions have been answered.  All parties agreeable. Please see the history and physical in the medical record for more information.  MEDICATIONS: Versed 4 mg IV and Demerol 100 mg IV in divided doses. Zofran 4 mg the  DESCRIPTION OF PROCEDURE:  After a digital rectal exam was performed, the [id number] ([id number])  colonoscope was advanced from the anus through the rectum and colon to the area of the cecum, ileocecal valve and appendiceal orifice.  The cecum was deeply intubated.  These structures were well-seen and photographed for the record.  From the level of the cecum and ileocecal valve, the scope was slowly and cautiously withdrawn.  The mucosal surfaces were carefully surveyed utilizing scope tip deflection to facilitate fold flattening as needed.  The scope was pulled down into the rectum where a thorough examination including retroflexion was performed.    FINDINGS:  Adequate preparation after considerable lavage and suctioning. Normal rectum. Scattered left-sided diverticula; the remainder of the colonic mucosa appeared normal.  THERAPEUTIC / DIAGNOSTIC MANEUVERS PERFORMED:  None  COMPLICATIONS: none CECAL WITHDRAWAL TIME:  11 minutes  IMPRESSION:  Colonic diverticulosis.  RECOMMENDATIONS: Consider repeat  screening colonoscopy in 5 years. See EGD report.   _______________________________ eSigned:  R. Roetta Sessions, MD FACP [facility] 10/20/2012 11:09 AM   CC:

## 2012-10-20 NOTE — Op Note (Signed)
[facility] [address] [place], [address]   ENDOSCOPY PROCEDURE REPORT  PATIENT: Duane Richardson, Duane Richardson  MR#: [id number] BIRTHDATE: 07-22-1955 , 57  yrs. old GENDER: Male ENDOSCOPIST: R.  Roetta Sessions, MD FACP FACG REFERRED BY:  Lynnea Ferrier, M.D. PROCEDURE DATE:  10/20/2012 PROCEDURE:     EGD with gastric biopsy  INDICATIONS:     weight loss; failure to thrive  INFORMED CONSENT:   The risks, benefits, limitations, alternatives and imponderables have been discussed.  The potential for biopsy, esophogeal dilation, etc. have also been reviewed.  Questions have been answered.  All parties agreeable.  Please see the history and physical in the medical record for more information.  MEDICATIONS:   Versed 4 mg IV and Demerol 100 mg IV in divided doses. Zofran 4 mg IV.  DESCRIPTION OF PROCEDURE:   The [id number] ([id number])  endoscope was introduced through the mouth and advanced to the second portion of the duodenum without difficulty or limitations.  The mucosal surfaces were surveyed very carefully during advancement of the scope and upon withdrawal.  Retroflexion view of the proximal stomach and esophagogastric junction was performed.      FINDINGS: Normal esophagus. Stomach empty. Small hiatal hernia. Normal-appearing gastric mucosa. Patent pylorus. Examination of bulb and second portion revealed scattered bulbar erosions only  THERAPEUTIC / DIAGNOSTIC MANEUVERS PERFORMED:  Biopsies the gastric mucosa taken to assess for H. pylori   COMPLICATIONS:  None  IMPRESSION:  Hiatal hernia. Duodenal erosions-status post gastric biopsy  RECOMMENDATIONS:  Followup on pathology. See colonoscopy report.`    _______________________________ R. Roetta Sessions, MD FACP [facility] eSigned:  R. Roetta Sessions, MD FACP [facility] 10/20/2012 10:48 AM     CC:

## 2012-10-20 NOTE — H&P (View-Only) (Signed)
Primary Care Physician:  Leo Grosser, MD  Primary Gastroenterologist:  Roetta Sessions, MD   Chief Complaint  Patient presents with  . Colonoscopy    HPI:  Duane Richardson is a 57 y.o. male here to reschedule colonoscopy and upper endoscopy. He was seen in may of 2014 to reschedule procedures that will plan for last fall. Treated for oral candida last year with resolution of dysphagia/odynophagia. Last colonoscopy in 2011 with multiple tubular adenomas removed. CT scan in January 2014 showed marked prostamegaly and fatty liver but no evidence of splenomegaly or cirrhosis. He has history of leukopenia and thrombocytopenia.   Appetite off/on. No dysphagia/odynophagia now. No abdominal pain. BM continue to be loose. 2 per day. No melena, brbpr. No hematuria. Urinary frequency. No etoh in one month. No drugs. Occasional BC powder. Weight continues to fall. Down another 5 pounds. He weighed 160 pounds in February 2013. 137 pounds in May of 2014.  History prostamegaly, elevated PSA. Was scheduled for cystoscopy yesterday but was sent to the emergency department due to hypertension last tachycardia (felt to be false secondary to tremor). He was asymptomatic. He is not sure when his cystoscopy is going to be rescheduled.  Current Outpatient Prescriptions  Medication Sig Dispense Refill  . amLODipine (NORVASC) 10 MG tablet Take 1 tablet (10 mg total) by mouth daily.  90 tablet  3  . omeprazole (PRILOSEC) 20 MG capsule Take 1 capsule (20 mg total) by mouth daily.  90 capsule  3  . sertraline (ZOLOFT) 100 MG tablet Take 100 mg by mouth 2 (two) times daily.       No current facility-administered medications for this visit.    Allergies as of 10/13/2012  . (No Known Allergies)    Past Medical History  Diagnosis Date  . Depression   . Elevated PSA   . Arthritis   . Hyperlipidemia   . Cataract   . Anxiety   . Bipolar 1 disorder   . ETOH abuse     quit Oct 2013, then relapsed, as of Dec  2013 now has abstained X 1 month  . COPD (chronic obstructive pulmonary disease)   . Asthma   . Allergy   . Hypertension   . GERD (gastroesophageal reflux disease)     Past Surgical History  Procedure Laterality Date  . Colonoscopy  12/21/09    AVW:UJWJ papilla otherwise normal/ pancolonic diverticula/mutiple colonic poylps  . Spine surgery    . Back surgery      lunbar    Family History  Problem Relation Age of Onset  . Colon cancer Neg Hx   . Mental illness Sister     History   Social History  . Marital Status: Single    Spouse Name: N/A    Number of Children: 0  . Years of Education: N/A   Occupational History  . disabled    Social History Main Topics  . Smoking status: Current Every Day Smoker -- 1.00 packs/day for 30 years    Types: Cigarettes  . Smokeless tobacco: Not on file  . Alcohol Use: 18.0 oz/week    30 Glasses of wine per week     Comment: No ETOH in 2 weeks . about a liter of wine per day-before that  . Drug Use: No  . Sexual Activity: Yes    Birth Control/ Protection: None   Other Topics Concern  . Not on file   Social History Narrative   In prison x 2, 14rs, 35  mo   Out for several yrs   Lives alone            ROS:  General: Negative for fever, chills, fatigue, weakness. Continued weight loss, additional 5 pounds since May. Positive anorexia Eyes: Negative for vision changes.  ENT: Negative for hoarseness, difficulty swallowing , nasal congestion. CV: Negative for chest pain, angina, palpitations, dyspnea on exertion, peripheral edema.  Respiratory: Negative for dyspnea at rest, dyspnea on exertion, cough, sputum, wheezing.  GI: See history of present illness. GU:  Negative for dysuria, hematuria, urinary incontinence. Positive urinary frequency, nocturnal urination.  MS: Negative for joint pain, low back pain.  Derm: Negative for rash or itching.  Neuro: Negative for weakness, abnormal sensation, seizure, frequent headaches, memory  loss, confusion. Positive tremor, chronic Psych: Negative for anxiety, depression, suicidal ideation, hallucinations.  Endo: Ongoing weight loss, additional 5 pounds since May..  Heme: Negative for bruising or bleeding. Allergy: Negative for rash or hives.    Physical Examination:  BP 125/88  Pulse 109  Temp(Src) 97.6 F (36.4 C) (Oral)  Ht 6\' 1"  (1.854 m)  Wt 132 lb 12.8 oz (60.238 kg)  BMI 17.52 kg/m2   General: Thin, well-developed in no acute distress.  Head: Normocephalic, atraumatic.   Eyes: Conjunctiva pink, no icterus. Mouth: Oropharyngeal mucosa moist and pink , no lesions erythema or exudate. Neck: Supple without thyromegaly, masses, or lymphadenopathy.  Lungs: Clear to auscultation bilaterally.  Heart: Regular rate and rhythm, no murmurs rubs or gallops.  Abdomen: Bowel sounds are normal, nontender, nondistended, no hepatosplenomegaly or masses, no abdominal bruits or    hernia , no rebound or guarding.   Rectal: Deferred Extremities: No lower extremity edema. No clubbing or deformities.  Neuro: Alert and oriented x 4 , grossly normal neurologically.  Skin: Warm and dry, no rash or jaundice.   Psych: Alert and cooperative, normal mood and affect.  Labs: Lab Results  Component Value Date   CREATININE 0.63 10/05/2012   BUN 3* 10/05/2012   NA 134* 10/05/2012   K 4.2 10/05/2012   CL 100 10/05/2012   CO2 25 10/05/2012   Lab Results  Component Value Date   WBC 2.4* 09/01/2012   HGB 13.1 10/05/2012   HCT 37.8* 10/05/2012   MCV 96.2 09/01/2012   PLT 62* 09/01/2012   Lab Results  Component Value Date   PSA 10.00* 09/01/2012   PSA 6.37 11/15/2011   PSA 2.17 07/08/2007   Lab Results  Component Value Date   TSH 2.228 09/01/2012      Imaging Studies: No results found.

## 2012-10-21 ENCOUNTER — Encounter (HOSPITAL_COMMUNITY): Payer: Self-pay | Admitting: Internal Medicine

## 2012-10-22 ENCOUNTER — Encounter: Payer: Self-pay | Admitting: Internal Medicine

## 2012-10-26 ENCOUNTER — Ambulatory Visit (INDEPENDENT_AMBULATORY_CARE_PROVIDER_SITE_OTHER): Payer: Medicaid Other | Admitting: Family Medicine

## 2012-10-26 ENCOUNTER — Encounter: Payer: Self-pay | Admitting: Family Medicine

## 2012-10-26 ENCOUNTER — Other Ambulatory Visit: Payer: Self-pay | Admitting: General Practice

## 2012-10-26 ENCOUNTER — Encounter: Payer: Self-pay | Admitting: General Practice

## 2012-10-26 VITALS — BP 140/80 | HR 86 | Temp 97.5°F | Resp 18 | Wt 137.0 lb

## 2012-10-26 DIAGNOSIS — D696 Thrombocytopenia, unspecified: Secondary | ICD-10-CM

## 2012-10-26 DIAGNOSIS — F10129 Alcohol abuse with intoxication, unspecified: Secondary | ICD-10-CM

## 2012-10-26 DIAGNOSIS — F10229 Alcohol dependence with intoxication, unspecified: Secondary | ICD-10-CM

## 2012-10-26 DIAGNOSIS — I10 Essential (primary) hypertension: Secondary | ICD-10-CM

## 2012-10-26 MED ORDER — HYDROCHLOROTHIAZIDE 12.5 MG PO CAPS: 12.5000 mg | ORAL_CAPSULE | Freq: Every day | ORAL | Status: DC

## 2012-10-26 NOTE — Progress Notes (Signed)
Letter from: Corbin Ade   Reason for Letter: Results Review   Send letter to patient.  Send copy of letter with path to referring provider and PCP.   Facilitate hematology referral.   offer office visit with [place] in 3 months in reference to weight loss, etc.

## 2012-10-26 NOTE — Progress Notes (Signed)
Referral sent to [facility]

## 2012-10-26 NOTE — Progress Notes (Signed)
Subjective:    Patient ID: Duane Richardson, male    DOB: [birth year], 57 y.o.   MRN: [id number]  HPI Patient has been found to have an elevated PSA. As part of the workup, urology had planned a cystoscopy. Cystoscopy was canceled when the patient presented with a blood pressure of 159/113. He was referred to the emergency room. In the emergency room he was found to be tachycardic to 200, with a tremor, and elevated blood pressure. After giving the patient his blood pressure medicine in the emergency room his blood pressure fell to 154/99 and he was discharged home. He is here today for medical clearance to proceed with a cystoscopy.  Unfortunately he is intoxicated on alcohol. He is accounted by a Child psychotherapist. The patient admits to drinking 2 fifths of liquor this morning.  He is incoherent, staggering, and shaking on encounter.  He is also agitated when try to examine him.  His blood pressure today fluctuates from 110-140 over 80s depending on his level of agitation. Past Medical History  Diagnosis Date  . Depression   . Elevated PSA   . Arthritis   . Hyperlipidemia   . Cataract   . Anxiety   . Bipolar 1 disorder   . ETOH abuse     quit Oct 2013, then relapsed, as of Dec 2013 now has abstained X 1 month  . COPD (chronic obstructive pulmonary disease)   . Asthma   . Allergy   . Hypertension   . GERD (gastroesophageal reflux disease)    Current Outpatient Prescriptions on File Prior to Visit  Medication Sig Dispense Refill  . amLODipine (NORVASC) 10 MG tablet Take 1 tablet (10 mg total) by mouth daily.  90 tablet  3  . omeprazole (PRILOSEC) 20 MG capsule Take 1 capsule (20 mg total) by mouth daily.  90 capsule  3  . sertraline (ZOLOFT) 100 MG tablet Take 100 mg by mouth 2 (two) times daily.       No current facility-administered medications on file prior to visit.   No Known Allergies History   Social History  . Marital Status: Single    Spouse Name: N/A    Number of Children: 0   . Years of Education: N/A   Occupational History  . disabled    Social History Main Topics  . Smoking status: Current Every Day Smoker -- 1.00 packs/day for 30 years    Types: Cigarettes  . Smokeless tobacco: Not on file  . Alcohol Use: 18.0 oz/week    30 Glasses of wine per week     Comment: No ETOH in 2 weeks . about a liter of wine per day-before that  . Drug Use: No  . Sexual Activity: Yes    Birth Control/ Protection: None   Other Topics Concern  . Not on file   Social History Narrative   In prison x 2, 14rs, 29 mo   Out for several yrs   Lives alone            Review of Systems  All other systems reviewed and are negative.       Objective:   Physical Exam  Cardiovascular: Normal rate, regular rhythm and normal heart sounds.   No murmur heard. Pulmonary/Chest: Effort normal and breath sounds normal. No respiratory distress. He has no wheezes. He has no rales.  Abdominal: Soft. Bowel sounds are normal. He exhibits no distension. There is no tenderness. There is no rebound.  Neurological: He is  alert. He displays normal reflexes. No cranial nerve deficit. Coordination abnormal.  Psychiatric: His affect is inappropriate. He is not slowed. Cognition and memory are impaired. He expresses inappropriate judgment. He is noncommunicative.  intoxicated          Assessment & Plan:  1. HTN (hypertension) Had hydrochlorothiazide 12.5 mg by mouth daily to his amlodipine. Recheck blood pressure in one - hydrochlorothiazide (MICROZIDE) 12.5 MG capsule; Take 1 capsule (12.5 mg total) by mouth daily.  Dispense: 30 capsule; Refill: 5  2. Alcohol abuse with intoxication I had a long conversation with the social worker Ether Griffins.  At this point, cystoscopy should be deferred until his sexual issues are controlled. He needs detoxification from alcohol. He also would benefit from a psychiatry consult.  Have recommended inpatient detoxification.  The social worker has arranged  a consultation at [facility] for an inpatient drug treatment program.   At the present time he is not medically cleared to proceed with cystoscopy until he is no longer abusing heavy quantities of alcohol.  A bleed his blood pressure is likely related to his uncontrolled psychiatric issues as well as his polysubstance abuse.

## 2012-10-26 NOTE — Progress Notes (Signed)
Referral made to hematology 

## 2012-10-26 NOTE — Progress Notes (Signed)
Letter mailed to pt. Please cc pcp and send referral Please nic ov in 3 months.

## 2012-10-28 ENCOUNTER — Encounter: Payer: Self-pay | Admitting: Internal Medicine

## 2012-10-28 NOTE — Progress Notes (Signed)
Pt is aware of OV on 12/4 at 10 with [facility] and appt card was mailed

## 2012-11-01 ENCOUNTER — Telehealth (HOSPITAL_COMMUNITY): Payer: Self-pay | Admitting: Oncology

## 2012-11-01 ENCOUNTER — Encounter (HOSPITAL_COMMUNITY): Payer: Medicaid Other | Attending: Hematology and Oncology

## 2012-11-01 ENCOUNTER — Encounter (HOSPITAL_COMMUNITY): Payer: Self-pay

## 2012-11-01 VITALS — BP 126/95 | HR 102 | Temp 98.0°F | Resp 16 | Ht 73.0 in | Wt 130.8 lb

## 2012-11-01 DIAGNOSIS — D61818 Other pancytopenia: Secondary | ICD-10-CM

## 2012-11-01 DIAGNOSIS — D696 Thrombocytopenia, unspecified: Secondary | ICD-10-CM | POA: Insufficient documentation

## 2012-11-01 DIAGNOSIS — R972 Elevated prostate specific antigen [PSA]: Secondary | ICD-10-CM

## 2012-11-01 DIAGNOSIS — R160 Hepatomegaly, not elsewhere classified: Secondary | ICD-10-CM

## 2012-11-01 NOTE — Progress Notes (Addendum)
Patient History and Physical   Duane Richardson [id number] 1955/12/15 57 y.o. 11/01/2012  Referring MD: Lynnea Ferrier, MD  Chief Complaint: Thrombocytopenia   HPI:  Duane Richardson is a 57 year old man who was referred for evaluation of thrombocytopenia. When I reviewed his historical CBCs in the electronic health record, his platelet count was normal in 2009 at 176,000  , and subsequently in September of 2013 this had dropped to 92K .  He maintained a normal hemoglobin and WBC. Since 10/2007,  platelets  Has ranged  between 62K to 250 K.  More recently in July of 2014 patient now was noted to have mild anemia for a hemoglobin of 12.5, platelet count 62K and a WBC of 2.4 with absolute neutrophil count of 0.6K.  Tells me that he stopped drinking about 3 months ago.  He also states that he started hydrochlorothiazide one month ago.  He did reports history of rectal bleeding but I noted that he has had a recent colonoscopy and EGD and 10/20/2012 which showed colonic diverticulosis, hiatal hernia and duodenal erosion. Pathology report is as follows; Stomach, biopsy - BENIGN GASTRIC BODY-TYPE MUCOSA. - NO EVIDENCE OF ACTIVE INFLAMMATION OR HELICOBACTER PYLORI IDENTIFIED. - NO EVIDENCE OF INTESTINAL METAPLASIA, DYSPLASIA OR MALIGNANCY.  Patient has had about 14 pounds weight loss over the last 5 months.  He denies fever or shortness of breath, denies chest pain or abdominal pain.  He tells me has noticed decreased appetite.  He smokes about 2 packs of cigarettes a day.  He had a CT of the abdomen in July 2014  which showed diffusely fatty infiltrate the liver 17 CM in cranial-caudal length and spleen was said to be unremarkable. Review of his records also show rising PSA since last year and will certainly in July 2014 was 10.  PMH: Past Medical History  Diagnosis Date  . Depression   . Elevated PSA   . Arthritis   . Hyperlipidemia   . Cataract   . Anxiety   . Bipolar 1 disorder   . ETOH abuse      quit Oct 2013, then relapsed, as of Dec 2013 now has abstained X 1 month  . COPD (chronic obstructive pulmonary disease)   . Asthma   . Allergy   . Hypertension   . GERD (gastroesophageal reflux disease)     Past Surgical History  Procedure Laterality Date  . Colonoscopy  12/21/09    WUJ:WJXB papilla otherwise normal/ pancolonic diverticula/mutiple colonic poylps  . Spine surgery    . Back surgery      lunbar  . Colonoscopy with esophagogastroduodenoscopy (egd) N/A 10/20/2012    Procedure: COLONOSCOPY WITH ESOPHAGOGASTRODUODENOSCOPY (EGD);  Surgeon: Corbin Ade, MD;  Location: [facility];  Service: Endoscopy;  Laterality: N/A;  10;15    Allergies: No Known Allergies  Medications: Current outpatient prescriptions:amLODipine (NORVASC) 10 MG tablet, Take 1 tablet (10 mg total) by mouth daily., Disp: 90 tablet, Rfl: 3;  hydrochlorothiazide (MICROZIDE) 12.5 MG capsule, Take 1 capsule (12.5 mg total) by mouth daily., Disp: 30 capsule, Rfl: 5;  omeprazole (PRILOSEC) 20 MG capsule, Take 1 capsule (20 mg total) by mouth daily., Disp: 90 capsule, Rfl: 3 sertraline (ZOLOFT) 100 MG tablet, Take 100 mg by mouth 2 (two) times daily., Disp: , Rfl:    Social History:  Lives alone. Used to drink alcohol until 3 months ago. Has more that 80 pack years.  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 Single, no children.  Eloise Levels( step  mom) and Sister  Madline spencer and his 2 close relatives.  Family History: Family History  Problem Relation Age of Onset  . Colon cancer Neg Hx   . Mental illness Sister     Review of Systems: 14 point review of system is as in the history above otherwise negative.   Physical Exam: Blood pressure 126/95, pulse 102, temperature 98 F (36.7 C), resp. rate 16, height 6\' 1"  (1.854 m), weight 130 lb 12.8 oz (59.33 kg). GENERAL: No distress, . Disheveled with poor personal hygiene and smell of stale urine. SKIN:  No rashes or significant lesions , no ecchymosis  or petechia or rash. HEAD: Normocephalic, No masses, lesions, tenderness or abnormalities  EYES: Conjunctiva are pink , non-injected and no jaundice. ENT: External ears normal ,lips, buccal mucosa, and tongue normal and mucous membranes are moist . LYMPH: No palpable lymphadenopathy,  In the neck supraclavicular area or axilla or inguinal areas. LUNGS: Markedly decreased breath sounds bilaterally with occasional wheezes HEART: regular rate & rhythm, no murmurs, no gallops, S1 normal and S2 normal  ABDOMEN: Abdomen soft, non-tender, normal bowel sounds, right upper quadrant fullness suspect hepatomegaly.  Spleen not palpably enlarged. EXTREMITIES:  NEURO: Alert & oriented , no focal motor deficits.     Lab Results: Lab Results  Component Value Date   WBC 2.4* 09/01/2012   HGB 13.1 10/05/2012   HCT 37.8* 10/05/2012   MCV 96.2 09/01/2012   PLT 62* 09/01/2012     Chemistry      Component Value Date/Time   NA 134* 10/05/2012 1030   NA 146 11/15/2011 1413   K 4.2 10/05/2012 1030   K 4.4 11/15/2011 1413   CL 100 10/05/2012 1030   CL 108 11/15/2011 1413   CO2 25 10/05/2012 1030   CO2 27 11/15/2011 1413   BUN 3* 10/05/2012 1030   BUN 6 11/15/2011 1413   CREATININE 0.63 10/05/2012 1030   CREATININE 0.62 09/01/2012 1443      Component Value Date/Time   CALCIUM 8.7 10/05/2012 1030   CALCIUM 8.4 11/15/2011 1413   ALKPHOS 100 09/01/2012 1443   ALKPHOS 89 11/15/2011 1413   AST 80* 09/01/2012 1443   AST 58 11/15/2011 1413   ALT 25 09/01/2012 1443   BILITOT 0.7 09/01/2012 1443   BILITOT 0.6 11/15/2011 1413         Radiological Studies: No results found.    Impression: Duane Richardson has an mild pancytopenia.  The differential diagnosis will include liver disease,hepatitis C infection ,HIV disease and medications.  Given known hepatomegaly I  suspect that is related more to the liver. Patient is also on hydrochlorothiazide which is known to cause thrombocytopenia. At present his counts does not seem to be  critically low and provided his platelet count  does not drop further  the HCTZ can be continued with routine CBCs. He has an elevated PSA which has now been worked up according to him.  Recommendations: 1. That a CBC/CMP/hepatitis C/HIV testing. 2. I will review peripheral blood smear. 3. Patient will return to clinic in 2 weeks to discuss the results. 4.  I discussed his case with our social worker to see if he could receive some help as regards his living situation as regards his living condition. 5. I referred him to urology for elevation of rising  PSA.    All questions were satisfactorily answered.She knows to call if she has any concern.  I spent 50% of the time was spent counseling the  patient face to face. The total time spent in the appointment was 60 minutes.   Sherral Hammers, MD FACP. Hematology/Oncology.     Addendum: Review of peripheral blood smear showed mild hypochromia future and platelets adjusted continue to refuse with a thick granulation otherwise unremarkable smear.  Hep C and HIV test are negative.  Patient may need bone marrow aspiration and biopsy for further evaluation of pancytopenia however I'll leave this to the discretion of the primary team and the Dr who will be assuming his care and subsequently.

## 2012-11-01 NOTE — Telephone Encounter (Signed)
Per Amy with [facility], patient is of sound mind and an evaluation of his living conditions is not warranted.

## 2012-11-01 NOTE — Patient Instructions (Signed)
[facility] Discharge Instructions  RECOMMENDATIONS MADE BY THE CONSULTANT AND ANY TEST RESULTS WILL BE SENT TO YOUR REFERRING PHYSICIAN.  EXAM FINDINGS BY THE PHYSICIAN TODAY AND SIGNS OR SYMPTOMS TO REPORT TO CLINIC OR PRIMARY PHYSICIAN: Exam findings as discussed by Dr. Sharia Reeve.  SPECIAL INSTRUCTIONS/FOLLOW-UP: 1.  Return tomorrow as scheduled for labwork.  We will see you back in the office in 2 weeks to review the labs with you. 2.  Please keep your ultrasound appointment as scheduled. 3.  You have been referred to a Urologist to evaluate your elevated PSA levels - checking your prostate.  Please contact our office if you have questions or concerns sooner than your next scheduled visit.  Thank you for choosing [facility] to provide your oncology and hematology care.  To afford each patient quality time with our providers, please arrive at least 15 minutes before your scheduled appointment time.  With your help, our goal is to use those 15 minutes to complete the necessary work-up to ensure our physicians have the information they need to help with your evaluation and healthcare recommendations.    Effective January 1st, 2014, we ask that you re-schedule your appointment with our physicians should you arrive 10 or more minutes late for your appointment.  We strive to give you quality time with our providers, and arriving late affects you and other patients whose appointments are after yours.    Again, thank you for choosing [facility].  Our hope is that these requests will decrease the amount of time that you wait before being seen by our physicians.       _____________________________________________________________  Should you have questions after your visit to [facility], please contact our office at [phone] between the hours of 8:30 a.m. and 5:00 p.m.  Voicemails left after 4:30 p.m. will not be returned until the  following business day.  For prescription refill requests, have your pharmacy contact our office with your prescription refill request.

## 2012-11-02 ENCOUNTER — Encounter (HOSPITAL_BASED_OUTPATIENT_CLINIC_OR_DEPARTMENT_OTHER): Payer: Medicaid Other

## 2012-11-02 DIAGNOSIS — D61818 Other pancytopenia: Secondary | ICD-10-CM

## 2012-11-02 DIAGNOSIS — R972 Elevated prostate specific antigen [PSA]: Secondary | ICD-10-CM

## 2012-11-02 LAB — COMPREHENSIVE METABOLIC PANEL
ALT: 30 U/L (ref 0–53)
AST: 87 U/L — ABNORMAL HIGH (ref 0–37)
Albumin: 3.1 g/dL — ABNORMAL LOW (ref 3.5–5.2)
Alkaline Phosphatase: 103 U/L (ref 39–117)
BUN: 5 mg/dL — ABNORMAL LOW (ref 6–23)
CO2: 23 mEq/L (ref 19–32)
Calcium: 8.8 mg/dL (ref 8.4–10.5)
Chloride: 99 mEq/L (ref 96–112)
Creatinine, Ser: 0.57 mg/dL (ref 0.50–1.35)
GFR calc Af Amer: >90 mL/min (ref >90)
GFR calc non Af Amer: >90 mL/min (ref >90)
Glucose, Bld: 109 mg/dL — ABNORMAL HIGH (ref 70–99)
Potassium: 3.6 mEq/L (ref 3.5–5.1)
Sodium: 133 mEq/L — ABNORMAL LOW (ref 135–145)
Total Bilirubin: 0.5 mg/dL (ref 0.3–1.2)
Total Protein: 6.9 g/dL (ref 6.0–8.3)

## 2012-11-02 LAB — CBC WITH DIFFERENTIAL/PLATELET
Basophils Absolute: 0 10*3/uL (ref 0.0–0.1)
Basophils Relative: 1 % (ref 0–1)
Eosinophils Absolute: 0 10*3/uL (ref 0.0–0.7)
Eosinophils Relative: 1 % (ref 0–5)
HCT: 36.7 % — ABNORMAL LOW (ref 39.0–52.0)
Hemoglobin: 12.6 g/dL — ABNORMAL LOW (ref 13.0–17.0)
Lymphocytes Relative: 56 % — ABNORMAL HIGH (ref 12–46)
Lymphs Abs: 1.3 10*3/uL (ref 0.7–4.0)
MCH: 33.7 pg (ref 26.0–34.0)
MCHC: 34.3 g/dL (ref 30.0–36.0)
MCV: 98.1 fL (ref 78.0–100.0)
Monocytes Absolute: 0.4 10*3/uL (ref 0.1–1.0)
Monocytes Relative: 17 % — ABNORMAL HIGH (ref 3–12)
Neutro Abs: 0.6 10*3/uL — ABNORMAL LOW (ref 1.7–7.7)
Neutrophils Relative %: 25 % — ABNORMAL LOW (ref 43–77)
Platelets: 108 10*3/uL — ABNORMAL LOW (ref 150–400)
RBC: 3.74 MIL/uL — ABNORMAL LOW (ref 4.22–5.81)
RDW: 14 % (ref 11.5–15.5)
Smear Review: DECREASED
WBC: 2.3 10*3/uL — ABNORMAL LOW (ref 4.0–10.5)

## 2012-11-02 LAB — LACTATE DEHYDROGENASE: LDH: 274 U/L — ABNORMAL HIGH (ref 94–250)

## 2012-11-02 LAB — HIV ANTIBODY (ROUTINE TESTING W REFLEX): HIV: NONREACTIVE

## 2012-11-02 LAB — HEPATITIS C ANTIBODY: HCV Ab: NEGATIVE

## 2012-11-02 NOTE — Progress Notes (Signed)
Labs drawn today for cbc/diff,cmp,ldh,hep C antibody,HIV,smear

## 2012-11-03 ENCOUNTER — Other Ambulatory Visit (HOSPITAL_COMMUNITY): Payer: Self-pay | Admitting: Oncology

## 2012-11-03 ENCOUNTER — Ambulatory Visit (HOSPITAL_COMMUNITY)
Admission: RE | Admit: 2012-11-03 | Discharge: 2012-11-03 | Disposition: A | Discharge status: Home / Self Care | Payer: Medicaid Other | Source: Ambulatory Visit | Attending: Hematology and Oncology | Admitting: Hematology and Oncology

## 2012-11-03 DIAGNOSIS — D61818 Other pancytopenia: Secondary | ICD-10-CM

## 2012-11-03 DIAGNOSIS — R972 Elevated prostate specific antigen [PSA]: Secondary | ICD-10-CM

## 2012-11-15 ENCOUNTER — Ambulatory Visit (HOSPITAL_COMMUNITY): Payer: Medicaid Other

## 2012-11-15 ENCOUNTER — Encounter (HOSPITAL_BASED_OUTPATIENT_CLINIC_OR_DEPARTMENT_OTHER): Payer: Medicaid Other

## 2012-11-15 ENCOUNTER — Encounter (HOSPITAL_COMMUNITY): Payer: Self-pay

## 2012-11-15 VITALS — BP 112/78 | HR 113 | Temp 97.0°F | Resp 20 | Wt 138.0 lb

## 2012-11-15 DIAGNOSIS — F10929 Alcohol use, unspecified with intoxication, unspecified: Secondary | ICD-10-CM

## 2012-11-15 DIAGNOSIS — F1010 Alcohol abuse, uncomplicated: Secondary | ICD-10-CM

## 2012-11-15 DIAGNOSIS — K760 Fatty (change of) liver, not elsewhere classified: Secondary | ICD-10-CM | POA: Insufficient documentation

## 2012-11-15 DIAGNOSIS — R972 Elevated prostate specific antigen [PSA]: Secondary | ICD-10-CM

## 2012-11-15 DIAGNOSIS — D61818 Other pancytopenia: Secondary | ICD-10-CM

## 2012-11-15 DIAGNOSIS — K7689 Other specified diseases of liver: Secondary | ICD-10-CM

## 2012-11-15 NOTE — Patient Instructions (Addendum)
[facility] Discharge Instructions  RECOMMENDATIONS MADE BY THE CONSULTANT AND ANY TEST RESULTS WILL BE SENT TO YOUR REFERRING PHYSICIAN.  EXAM FINDINGS BY THE PHYSICIAN TODAY AND SIGNS OR SYMPTOMS TO REPORT TO CLINIC OR PRIMARY PHYSICIAN: Exam and findings as discussed by Dr. Zigmund Daniel.  MEDICATIONS PRESCRIBED:  none  INSTRUCTIONS/FOLLOW-UP:  Discharged from clinic.   Thank you for choosing [facility] to provide your oncology and hematology care.  To afford each patient quality time with our providers, please arrive at least 15 minutes before your scheduled appointment time.  With your help, our goal is to use those 15 minutes to complete the necessary work-up to ensure our physicians have the information they need to help with your evaluation and healthcare recommendations.    Effective January 1st, 2014, we ask that you re-schedule your appointment with our physicians should you arrive 10 or more minutes late for your appointment.  We strive to give you quality time with our providers, and arriving late affects you and other patients whose appointments are after yours.    Again, thank you for choosing [facility].  Our hope is that these requests will decrease the amount of time that you wait before being seen by our physicians.       _____________________________________________________________  Should you have questions after your visit to [facility], please contact our office at [phone] between the hours of 8:30 a.m. and 5:00 p.m.  Voicemails left after 4:30 p.m. will not be returned until the following business day.  For prescription refill requests, have your pharmacy contact our office with your prescription refill request.

## 2012-11-15 NOTE — Progress Notes (Signed)
[facility] OFFICE PROGRESS NOTE  Leo Grosser, MD [address] [place] [address]  DIAGNOSIS: Pancytopenia  Elevated PSA  Steatosis of liver  Alcohol intoxication  Chief Complaint  Patient presents with  . Follow-up    CURRENT THERAPY: No active treatment at this time.  INTERVAL HISTORY: Duane Richardson 57 y.o. male returns for followup of thrombocytopenia in the setting of pancytopenia and alcohol abuse. Patient did drink a liter of wine today prior to his visit. He said difficulty with urination with frequency but no red blood per he did have one episode of rectal bleeding in the past which was attributed to hemorrhoids. He denies a sore mouth, cough, wheezing, epistaxis, melena, or significant lower; or redness. He does admit to peripheral paresthesias. He appears today with his Child psychotherapist.   MEDICAL HISTORY: Past Medical History  Diagnosis Date  . Depression   . Elevated PSA   . Arthritis   . Hyperlipidemia   . Cataract   . Anxiety   . Bipolar 1 disorder   . ETOH abuse     quit Oct 2013, then relapsed, as of Dec 2013 now has abstained X 1 month  . COPD (chronic obstructive pulmonary disease)   . Asthma   . Allergy   . Hypertension   . GERD (gastroesophageal reflux disease)     INTERIM HISTORY: has HYPERLIPIDEMIA; LEUKOPENIA, MILD; DISORDER, BIPOLAR NOS; TOBACCO ABUSE; DEPRESSION; COMMON MIGRAINE; CATARACT NOS; ALLERGIC RHINITIS; ASTHMA; COPD, MILD; GERD; ARTHRITIS; LOW BACK PAIN, CHRONIC; MALAISE AND FATIGUE; SYMPTOM, COUGH; CHEST PAIN; LIVER FUNCTION TESTS, ABNORMAL; Dysphagia; Oral candida; Rectal bleed; Unintentional weight loss; Hx of adenomatous colonic polyps; Weight loss; ETOH abuse; Loss of weight; Loose stools; Anorexia nervosa; Thrombocytopenia, unspecified; Steatosis of liver; and Alcohol intoxication on his problem list.    ALLERGIES:  has No Known Allergies.  MEDICATIONS: has a current medication list which includes  the following prescription(s): amlodipine, hydrochlorothiazide, omeprazole, and sertraline.  SURGICAL HISTORY:  Past Surgical History  Procedure Laterality Date  . Colonoscopy  12/21/09    YNW:GNFA papilla otherwise normal/ pancolonic diverticula/mutiple colonic poylps  . Spine surgery    . Back surgery      lunbar  . Colonoscopy with esophagogastroduodenoscopy (egd) N/A 10/20/2012    Procedure: COLONOSCOPY WITH ESOPHAGOGASTRODUODENOSCOPY (EGD);  Surgeon: Corbin Ade, MD;  [facility];  Service: Endoscopy;  Laterality: N/A;  10;15    FAMILY HISTORY: family history includes Mental illness in his sister. There is no history of Colon cancer.  SOCIAL HISTORY:  reports that he has been smoking Cigarettes.  He has a 30 pack-year smoking history. He does not have any smokeless tobacco history on file. He reports that he drinks about 18.0 ounces of alcohol per week. He reports that he does not use illicit drugs.  REVIEW OF SYSTEMS:  Other than that discussed above is noncontributory.  PHYSICAL EXAMINATION: ECOG PERFORMANCE STATUS: 1 - Symptomatic but completely ambulatory  Blood pressure 112/78, pulse 113, temperature 97 F (36.1 C), temperature source Oral, resp. rate 20, weight 138 lb (62.596 kg).  GENERAL:alert, no distress and comfortable. Alcohol fetor on breath. SKIN: skin color, texture, turgor are normal, no rashes or significant lesions EYES: normal, Conjunctiva are pink and non-injected, sclera clear OROPHARYNX:no exudate, no erythema and lips, buccal mucosa, and tongue normal  NECK: supple, thyroid normal size, non-tender, without nodularity CHEST: Increased AP diameter with bilateral gynecomastia. LYMPH:  no palpable lymphadenopathy in the cervical, axillary or  inguinal LUNGS: clear to auscultation and percussion with normal breathing effort HEART: regular rate & rhythm and no murmurs and no lower extremity edema ABDOMEN:abdomen soft, non-tender and normal bowel  sounds. Minimal right upper quadrant tenderness without palpable spleen. Musculoskeletal:no cyanosis of digits and no clubbing  NEURO: alert & oriented x 3 with fluent speech, no focal motor/sensory deficits   LABORATORY DATA: Infusion on 11/02/2012  Component Date Value Range Status  . WBC 11/02/2012 2.3* 4.0 - 10.5 K/uL Final  . RBC 11/02/2012 3.74* 4.22 - 5.81 MIL/uL Final  . Hemoglobin 11/02/2012 12.6* 13.0 - 17.0 g/dL Final  . HCT 98/12/9145 36.7* 39.0 - 52.0 % Final  . MCV 11/02/2012 98.1  78.0 - 100.0 fL Final  . MCH 11/02/2012 33.7  26.0 - 34.0 pg Final  . MCHC 11/02/2012 34.3  30.0 - 36.0 g/dL Final  . RDW 82/95/6213 14.0  11.5 - 15.5 % Final  . Platelets 11/02/2012 108* 150 - 400 K/uL Final   Comment: SPECIMEN CHECKED FOR CLOTS                          PLATELET COUNT CONFIRMED BY SMEAR  . Neutrophils Relative % 11/02/2012 25* 43 - 77 % Final  . Neutro Abs 11/02/2012 0.6* 1.7 - 7.7 K/uL Final  . Lymphocytes Relative 11/02/2012 56* 12 - 46 % Final  . Lymphs Abs 11/02/2012 1.3  0.7 - 4.0 K/uL Final  . Monocytes Relative 11/02/2012 17* 3 - 12 % Final  . Monocytes Absolute 11/02/2012 0.4  0.1 - 1.0 K/uL Final  . Eosinophils Relative 11/02/2012 1  0 - 5 % Final  . Eosinophils Absolute 11/02/2012 0.0  0.0 - 0.7 K/uL Final  . Basophils Relative 11/02/2012 1  0 - 1 % Final  . Basophils Absolute 11/02/2012 0.0  0.0 - 0.1 K/uL Final  . Smear Review 11/02/2012 PLATELETS APPEAR DECREASED   Final  . Sodium 11/02/2012 133* 135 - 145 mEq/L Final  . Potassium 11/02/2012 3.6  3.5 - 5.1 mEq/L Final  . Chloride 11/02/2012 99  96 - 112 mEq/L Final  . CO2 11/02/2012 23  19 - 32 mEq/L Final  . Glucose, Bld 11/02/2012 109* 70 - 99 mg/dL Final  . BUN 08/65/7846 5* 6 - 23 mg/dL Final  . Creatinine, Ser 11/02/2012 0.57  0.50 - 1.35 mg/dL Final  . Calcium 96/29/5284 8.8  8.4 - 10.5 mg/dL Final  . Total Protein 11/02/2012 6.9  6.0 - 8.3 g/dL Final  . Albumin 13/24/4010 3.1* 3.5 - 5.2 g/dL Final   . AST 27/25/3664 87* 0 - 37 U/L Final  . ALT 11/02/2012 30  0 - 53 U/L Final  . Alkaline Phosphatase 11/02/2012 103  39 - 117 U/L Final  . Total Bilirubin 11/02/2012 0.5  0.3 - 1.2 mg/dL Final  . GFR calc non Af Amer 11/02/2012 >90  >90 mL/min Final  . GFR calc Af Amer 11/02/2012 >90  >90 mL/min Final   Comment: (NOTE)                          The eGFR has been calculated using the CKD EPI equation.                          This calculation has not been validated in all clinical situations.  eGFR's persistently <90 mL/min signify possible Chronic Kidney                          Disease.  Marland Kitchen LDH 11/02/2012 274* 94 - 250 U/L Final  . HCV Ab 11/02/2012 NEGATIVE  NEGATIVE Final   Performed at [facility]  . HIV 11/02/2012 NON REACTIVE  NON REACTIVE Final   Performed at [facility]     Urinalysis    Component Value Date/Time   COLORURINE YELLOW 11/06/2011 1422   APPEARANCEUR CLEAR 11/06/2011 1422   LABSPEC 1.015 11/06/2011 1422   PHURINE 6.0 11/06/2011 1422   GLUCOSEU NEGATIVE 11/06/2011 1422   HGBUR NEGATIVE 11/06/2011 1422   BILIRUBINUR SMALL* 11/06/2011 1422   KETONESUR NEGATIVE 11/06/2011 1422   PROTEINUR 30* 11/06/2011 1422   UROBILINOGEN 1.0 11/06/2011 1422   NITRITE NEGATIVE 11/06/2011 1422   LEUKOCYTESUR TRACE* 11/06/2011 1422    RADIOGRAPHIC STUDIES: US Abdomen Complete  11/03/2012   *RADIOLOGY REPORT*  Clinical Data:  Pancytopenia, evaluate for hepatosplenomegaly  COMPLETE ABDOMINAL ULTRASOUND  Comparison:  CT abdomen pelvis dated 03/05/2012  Findings:  Gallbladder:  Underdistended.  No gallstones, gallbladder wall thickening, or pericholecystic fluid.  Negative sonographic Murphy's sign.  Common bile duct:  Measures 5 mm.  Liver:  No focal lesion identified.  Hyperechoic hepatic parenchyma, suggesting hepatic steatosis.  Measures 15.9 cm, within normal limits.  IVC:  Appears normal.  Pancreas:  Not well visualized due to overlying bowel gas.   Spleen:  Measures 4.5 cm, within normal limits.  Right Kidney:  Measures 10.3 cm.  No mass or hydronephrosis.  Left Kidney:  Measures 9.8 cm.  No mass or hydronephrosis.  Abdominal aorta:  No aneurysm identified.  IMPRESSION: Liver and spleen are within normal limits for size.  Suspected hepatic steatosis.   Original Report Authenticated By: Charline Bills, M.D.    ASSESSMENT: #1. Pancytopenia secondary to alcoholic liver disease. #2. History of kidney stone in January 2014 now with urinary hesitancy and elevated PSA, possible prostate cancer. #3. Alcohol abuse. #4. Bipolar disorder. #5. Chronic obstructive pulmonary disease   PLAN: #1. The patient was reassured in the presence of his Child psychotherapist. #2. Followup with urologist. He was to have cystoscopy but blood pressure was high, he was sent to the emergency room, now has normal blood pressure. #3. The patient was advised to discontinue alcohol abuse. He will be referred to a rehabilitation  Center. #4 no further appointments were made in this office.   All questions were answered. The patient knows to call the clinic with any problems, questions or concerns. We can certainly see the patient much sooner if necessary.  The patient and plan discussed with Alla German A and he is in agreement with the aforementioned.  I spent 30 minutes counseling the patient face to face. The total time spent in the appointment was 25 minutes.    Maurilio Lovely, MD 11/15/2012 2:30 PM

## 2012-11-23 ENCOUNTER — Emergency Department (HOSPITAL_COMMUNITY): Payer: Medicaid Other

## 2012-11-23 ENCOUNTER — Encounter (HOSPITAL_COMMUNITY): Payer: Self-pay

## 2012-11-23 ENCOUNTER — Inpatient Hospital Stay (HOSPITAL_COMMUNITY)
Admission: EM | Admit: 2012-11-23 | Discharge: 2012-12-01 | DRG: 480 | Disposition: A | Discharge status: Skilled Nursing Facility | Payer: Medicaid Other | Attending: Internal Medicine | Admitting: Internal Medicine

## 2012-11-23 DIAGNOSIS — Z86010 Personal history of colonic polyps: Secondary | ICD-10-CM

## 2012-11-23 DIAGNOSIS — J309 Allergic rhinitis, unspecified: Secondary | ICD-10-CM

## 2012-11-23 DIAGNOSIS — M545 Low back pain: Secondary | ICD-10-CM

## 2012-11-23 DIAGNOSIS — K625 Hemorrhage of anus and rectum: Secondary | ICD-10-CM

## 2012-11-23 DIAGNOSIS — M169 Osteoarthritis of hip, unspecified: Secondary | ICD-10-CM | POA: Diagnosis present

## 2012-11-23 DIAGNOSIS — W108XXA Fall (on) (from) other stairs and steps, initial encounter: Secondary | ICD-10-CM | POA: Diagnosis present

## 2012-11-23 DIAGNOSIS — G43009 Migraine without aura, not intractable, without status migrainosus: Secondary | ICD-10-CM

## 2012-11-23 DIAGNOSIS — F329 Major depressive disorder, single episode, unspecified: Secondary | ICD-10-CM

## 2012-11-23 DIAGNOSIS — S72143A Displaced intertrochanteric fracture of unspecified femur, initial encounter for closed fracture: Principal | ICD-10-CM | POA: Diagnosis present

## 2012-11-23 DIAGNOSIS — R945 Abnormal results of liver function studies: Secondary | ICD-10-CM

## 2012-11-23 DIAGNOSIS — I10 Essential (primary) hypertension: Secondary | ICD-10-CM

## 2012-11-23 DIAGNOSIS — F319 Bipolar disorder, unspecified: Secondary | ICD-10-CM | POA: Diagnosis present

## 2012-11-23 DIAGNOSIS — F10929 Alcohol use, unspecified with intoxication, unspecified: Secondary | ICD-10-CM

## 2012-11-23 DIAGNOSIS — E876 Hypokalemia: Secondary | ICD-10-CM | POA: Diagnosis present

## 2012-11-23 DIAGNOSIS — J45909 Unspecified asthma, uncomplicated: Secondary | ICD-10-CM

## 2012-11-23 DIAGNOSIS — F10239 Alcohol dependence with withdrawal, unspecified: Secondary | ICD-10-CM

## 2012-11-23 DIAGNOSIS — D62 Acute posthemorrhagic anemia: Secondary | ICD-10-CM | POA: Diagnosis not present

## 2012-11-23 DIAGNOSIS — B370 Candidal stomatitis: Secondary | ICD-10-CM

## 2012-11-23 DIAGNOSIS — J4489 Other specified chronic obstructive pulmonary disease: Secondary | ICD-10-CM | POA: Diagnosis present

## 2012-11-23 DIAGNOSIS — F411 Generalized anxiety disorder: Secondary | ICD-10-CM | POA: Diagnosis present

## 2012-11-23 DIAGNOSIS — R05 Cough: Secondary | ICD-10-CM

## 2012-11-23 DIAGNOSIS — K219 Gastro-esophageal reflux disease without esophagitis: Secondary | ICD-10-CM

## 2012-11-23 DIAGNOSIS — F10939 Alcohol use, unspecified with withdrawal, unspecified: Secondary | ICD-10-CM | POA: Diagnosis present

## 2012-11-23 DIAGNOSIS — D72819 Decreased white blood cell count, unspecified: Secondary | ICD-10-CM

## 2012-11-23 DIAGNOSIS — F1010 Alcohol abuse, uncomplicated: Secondary | ICD-10-CM

## 2012-11-23 DIAGNOSIS — H269 Unspecified cataract: Secondary | ICD-10-CM

## 2012-11-23 DIAGNOSIS — E43 Unspecified severe protein-calorie malnutrition: Secondary | ICD-10-CM | POA: Diagnosis present

## 2012-11-23 DIAGNOSIS — R195 Other fecal abnormalities: Secondary | ICD-10-CM

## 2012-11-23 DIAGNOSIS — S72002A Fracture of unspecified part of neck of left femur, initial encounter for closed fracture: Secondary | ICD-10-CM

## 2012-11-23 DIAGNOSIS — J449 Chronic obstructive pulmonary disease, unspecified: Secondary | ICD-10-CM

## 2012-11-23 DIAGNOSIS — D696 Thrombocytopenia, unspecified: Secondary | ICD-10-CM

## 2012-11-23 DIAGNOSIS — R079 Chest pain, unspecified: Secondary | ICD-10-CM

## 2012-11-23 DIAGNOSIS — S72009A Fracture of unspecified part of neck of unspecified femur, initial encounter for closed fracture: Secondary | ICD-10-CM

## 2012-11-23 DIAGNOSIS — D61818 Other pancytopenia: Secondary | ICD-10-CM | POA: Diagnosis present

## 2012-11-23 DIAGNOSIS — K760 Fatty (change of) liver, not elsewhere classified: Secondary | ICD-10-CM

## 2012-11-23 DIAGNOSIS — F17200 Nicotine dependence, unspecified, uncomplicated: Secondary | ICD-10-CM | POA: Diagnosis present

## 2012-11-23 DIAGNOSIS — M129 Arthropathy, unspecified: Secondary | ICD-10-CM

## 2012-11-23 DIAGNOSIS — R1310 Dysphagia, unspecified: Secondary | ICD-10-CM

## 2012-11-23 DIAGNOSIS — R634 Abnormal weight loss: Secondary | ICD-10-CM

## 2012-11-23 DIAGNOSIS — F1020 Alcohol dependence, uncomplicated: Secondary | ICD-10-CM | POA: Diagnosis present

## 2012-11-23 DIAGNOSIS — F5000 Anorexia nervosa, unspecified: Secondary | ICD-10-CM

## 2012-11-23 DIAGNOSIS — E785 Hyperlipidemia, unspecified: Secondary | ICD-10-CM

## 2012-11-23 DIAGNOSIS — M1610 Unilateral primary osteoarthritis, unspecified hip: Secondary | ICD-10-CM | POA: Diagnosis present

## 2012-11-23 DIAGNOSIS — R5381 Other malaise: Secondary | ICD-10-CM

## 2012-11-23 LAB — CBC WITH DIFFERENTIAL/PLATELET
Basophils Absolute: 0 10*3/uL (ref 0.0–0.1)
Basophils Relative: 2 % — ABNORMAL HIGH (ref 0–1)
Eosinophils Absolute: 0 10*3/uL (ref 0.0–0.7)
Eosinophils Relative: 0 % (ref 0–5)
HCT: 37.4 % — ABNORMAL LOW (ref 39.0–52.0)
Hemoglobin: 13 g/dL (ref 13.0–17.0)
Lymphocytes Relative: 61 % — ABNORMAL HIGH (ref 12–46)
Lymphs Abs: 1.3 10*3/uL (ref 0.7–4.0)
MCH: 34.2 pg — ABNORMAL HIGH (ref 26.0–34.0)
MCHC: 34.8 g/dL (ref 30.0–36.0)
MCV: 98.4 fL (ref 78.0–100.0)
Monocytes Absolute: 0.4 10*3/uL (ref 0.1–1.0)
Monocytes Relative: 18 % — ABNORMAL HIGH (ref 3–12)
Neutro Abs: 0.4 10*3/uL — ABNORMAL LOW (ref 1.7–7.7)
Neutrophils Relative %: 19 % — ABNORMAL LOW (ref 43–77)
Platelets: 94 10*3/uL — ABNORMAL LOW (ref 150–400)
RBC: 3.8 MIL/uL — ABNORMAL LOW (ref 4.22–5.81)
RDW: 14.4 % (ref 11.5–15.5)
WBC: 2.1 10*3/uL — ABNORMAL LOW (ref 4.0–10.5)

## 2012-11-23 LAB — COMPREHENSIVE METABOLIC PANEL
ALT: 30 U/L (ref 0–53)
AST: 85 U/L — ABNORMAL HIGH (ref 0–37)
Albumin: 3.1 g/dL — ABNORMAL LOW (ref 3.5–5.2)
Alkaline Phosphatase: 120 U/L — ABNORMAL HIGH (ref 39–117)
BUN: 4 mg/dL — ABNORMAL LOW (ref 6–23)
CO2: 25 mEq/L (ref 19–32)
Calcium: 8.7 mg/dL (ref 8.4–10.5)
Chloride: 99 mEq/L (ref 96–112)
Creatinine, Ser: 0.58 mg/dL (ref 0.50–1.35)
GFR calc Af Amer: >90 mL/min (ref >90)
GFR calc non Af Amer: >90 mL/min (ref >90)
Glucose, Bld: 88 mg/dL (ref 70–99)
Potassium: 3.1 mEq/L — ABNORMAL LOW (ref 3.5–5.1)
Sodium: 136 mEq/L (ref 135–145)
Total Bilirubin: 0.5 mg/dL (ref 0.3–1.2)
Total Protein: 7.2 g/dL (ref 6.0–8.3)

## 2012-11-23 LAB — ABO/RH: ABO/RH(D): A POS

## 2012-11-23 LAB — PROTIME-INR
INR: 0.95 (ref 0.00–1.49)
Prothrombin Time: 12.5 seconds (ref 11.6–15.2)

## 2012-11-23 LAB — ETHANOL: Alcohol, Ethyl (B): 273 mg/dL — ABNORMAL HIGH (ref 0–11)

## 2012-11-23 LAB — PREPARE RBC (CROSSMATCH)

## 2012-11-23 MED ORDER — CEFAZOLIN SODIUM-DEXTROSE 2-3 GM-% IV SOLR
2.0000 g | Freq: Once | INTRAVENOUS | Status: DC
  Filled 2012-11-23: qty 50

## 2012-11-23 MED ORDER — SODIUM CHLORIDE 0.9 % IV SOLN
INTRAVENOUS | Status: DC
  Administered 2012-11-24: via INTRAVENOUS
  Administered 2012-11-25: 1000 mL via INTRAVENOUS

## 2012-11-23 MED ORDER — MORPHINE SULFATE 2 MG/ML IJ SOLN
0.5000 mg | INTRAMUSCULAR | Status: DC | PRN
  Administered 2012-11-24 – 2012-11-26 (×14): 0.5 mg via INTRAVENOUS
  Filled 2012-11-23 (×14): qty 1

## 2012-11-23 MED ORDER — ENOXAPARIN SODIUM 40 MG/0.4ML ~~LOC~~ SOLN
40.0000 mg | SUBCUTANEOUS | Status: DC
  Filled 2012-11-23: qty 0.4

## 2012-11-23 MED ORDER — ONDANSETRON HCL 4 MG/2ML IJ SOLN
4.0000 mg | Freq: Once | INTRAMUSCULAR | Status: AC
  Administered 2012-11-23: 4 mg via INTRAVENOUS
  Filled 2012-11-23: qty 2

## 2012-11-23 MED ORDER — FOLIC ACID 1 MG PO TABS
1.0000 mg | ORAL_TABLET | Freq: Every day | ORAL | Status: DC
  Administered 2012-11-24 – 2012-12-01 (×8): 1 mg via ORAL
  Filled 2012-11-23 (×8): qty 1

## 2012-11-23 MED ORDER — LORAZEPAM 2 MG/ML IJ SOLN
1.0000 mg | Freq: Four times a day (QID) | INTRAMUSCULAR | Status: AC | PRN
  Administered 2012-11-24 – 2012-11-26 (×6): 1 mg via INTRAVENOUS
  Filled 2012-11-23 (×7): qty 1

## 2012-11-23 MED ORDER — POVIDONE-IODINE 10 % EX SOLN
Freq: Once | CUTANEOUS | Status: AC
  Administered 2012-11-24: 04:00:00 via TOPICAL

## 2012-11-23 MED ORDER — SODIUM CHLORIDE 0.9 % IV SOLN
Freq: Once | INTRAVENOUS | Status: AC
  Administered 2012-11-23: 20 mL via INTRAVENOUS

## 2012-11-23 MED ORDER — LORAZEPAM 1 MG PO TABS
1.0000 mg | ORAL_TABLET | Freq: Four times a day (QID) | ORAL | Status: AC | PRN
  Administered 2012-11-24 – 2012-11-26 (×3): 1 mg via ORAL
  Filled 2012-11-23 (×4): qty 1

## 2012-11-23 MED ORDER — AMLODIPINE BESYLATE 5 MG PO TABS
10.0000 mg | ORAL_TABLET | Freq: Every day | ORAL | Status: DC
  Administered 2012-11-24 – 2012-12-01 (×8): 10 mg via ORAL
  Filled 2012-11-23 (×8): qty 2

## 2012-11-23 MED ORDER — PROMETHAZINE HCL 25 MG/ML IJ SOLN: 12.5000 mg | INTRAMUSCULAR | Status: DC | PRN

## 2012-11-23 MED ORDER — THIAMINE HCL 100 MG/ML IJ SOLN: 100.0000 mg | Freq: Every day | INTRAMUSCULAR | Status: DC

## 2012-11-23 MED ORDER — VITAMIN B-1 100 MG PO TABS
100.0000 mg | ORAL_TABLET | Freq: Every day | ORAL | Status: DC
  Administered 2012-11-24 – 2012-12-01 (×8): 100 mg via ORAL
  Filled 2012-11-23 (×8): qty 1

## 2012-11-23 MED ORDER — POTASSIUM CHLORIDE 10 MEQ/100ML IV SOLN
10.0000 meq | INTRAVENOUS | Status: AC
  Administered 2012-11-24 (×3): 10 meq via INTRAVENOUS
  Filled 2012-11-23 (×3): qty 100

## 2012-11-23 MED ORDER — MORPHINE SULFATE 4 MG/ML IJ SOLN
4.0000 mg | INTRAMUSCULAR | Status: AC | PRN
  Administered 2012-11-23 (×3): 4 mg via INTRAVENOUS
  Filled 2012-11-23 (×3): qty 1

## 2012-11-23 MED ORDER — HYDROCODONE-ACETAMINOPHEN 5-325 MG PO TABS
1.0000 | ORAL_TABLET | Freq: Four times a day (QID) | ORAL | Status: DC | PRN
  Administered 2012-11-24: 2 via ORAL
  Administered 2012-11-25: 1 via ORAL
  Administered 2012-11-26: 2 via ORAL
  Filled 2012-11-23: qty 2
  Filled 2012-11-23: qty 1
  Filled 2012-11-23: qty 2

## 2012-11-23 MED ORDER — SERTRALINE HCL 50 MG PO TABS
100.0000 mg | ORAL_TABLET | Freq: Two times a day (BID) | ORAL | Status: DC
  Administered 2012-11-24 – 2012-12-01 (×16): 100 mg via ORAL
  Filled 2012-11-23 (×16): qty 2

## 2012-11-23 MED ORDER — PANTOPRAZOLE SODIUM 40 MG PO TBEC
40.0000 mg | DELAYED_RELEASE_TABLET | Freq: Every day | ORAL | Status: DC
  Administered 2012-11-24 – 2012-12-01 (×8): 40 mg via ORAL
  Filled 2012-11-23 (×8): qty 1

## 2012-11-23 MED ORDER — ADULT MULTIVITAMIN W/MINERALS CH
1.0000 | ORAL_TABLET | Freq: Every day | ORAL | Status: DC
  Administered 2012-11-24 – 2012-12-01 (×8): 1 via ORAL
  Filled 2012-11-23 (×8): qty 1

## 2012-11-23 NOTE — Consult Note (Signed)
Reason for Consult:Fracture of the left hip Referring Physician: ER  Duane Richardson is an 57 y.o. male.  HPI: Patient seen in the ER. He fell off his porch several hours ago.  He hurt his left hip. He has no other injury. He was unable to stand.  X-rays show intertrochanteric fracture of the left hip with pre-existing degenerative changes of the femoral head and acetabulum.   He has been told of the fracture and need for surgery.  I have gone over the risks and imponderables.  He has had problems with alcohol use and has had problems with weight loss secondary to this.  He also smokes.  These are risk problems.  I have also discussed infection, embolus which could lead to death, need for possible blood transfusion, need for physical therapy and post hospital nursing home placement and anesthesia risks.  I have recommended spinal anesthesia.  He asked appropriate questions and appears to understand.  He will be admitted to the hospitalist service.  His physician is in [place] area.  Labs are pending.  Past Medical History  Diagnosis Date  . Depression   . Elevated PSA   . Arthritis   . Hyperlipidemia   . Cataract   . Anxiety   . Bipolar 1 disorder   . ETOH abuse     quit Oct 2013, then relapsed, as of Dec 2013 now has abstained X 1 month  . COPD (chronic obstructive pulmonary disease)   . Asthma   . Allergy   . Hypertension   . GERD (gastroesophageal reflux disease)     Past Surgical History  Procedure Laterality Date  . Colonoscopy  12/21/09    ZOX:WRUE papilla otherwise normal/ pancolonic diverticula/mutiple colonic poylps  . Spine surgery    . Back surgery      lunbar  . Colonoscopy with esophagogastroduodenoscopy (egd) N/A 10/20/2012    Procedure: COLONOSCOPY WITH ESOPHAGOGASTRODUODENOSCOPY (EGD);  Surgeon: Corbin Ade, MD;  Location: [facility];  Service: Endoscopy;  Laterality: N/A;  10;15    Family History  Problem Relation Age of Onset  . Colon cancer Neg Hx     . Mental illness Sister     Social History:  reports that he has been smoking Cigarettes.  He has a 30 pack-year smoking history. He does not have any smokeless tobacco history on file. He reports that he drinks about 18.0 ounces of alcohol per week. He reports that he does not use illicit drugs.  Allergies: No Known Allergies  Medications: I have reviewed the patient's current medications.  Results for orders placed during the hospital encounter of 11/23/12 (from the past 48 hour(s))  CBC WITH DIFFERENTIAL     Status: Abnormal   Collection Time    11/23/12  9:02 PM      Result Value Range   WBC 2.1 (*) 4.0 - 10.5 K/uL   RBC 3.80 (*) 4.22 - 5.81 MIL/uL   Hemoglobin 13.0  13.0 - 17.0 g/dL   HCT 45.4 (*) 09.8 - 11.9 %   MCV 98.4  78.0 - 100.0 fL   MCH 34.2 (*) 26.0 - 34.0 pg   MCHC 34.8  30.0 - 36.0 g/dL   RDW 14.7  82.9 - 56.2 %   Platelets 94 (*) 150 - 400 K/uL   Neutrophils Relative % 19 (*) 43 - 77 %   Lymphocytes Relative 61 (*) 12 - 46 %   Monocytes Relative 18 (*) 3 - 12 %  Eosinophils Relative 0  0 - 5 %   Basophils Relative 2 (*) 0 - 1 %   Neutro Abs 0.4 (*) 1.7 - 7.7 K/uL   Lymphs Abs 1.3  0.7 - 4.0 K/uL   Monocytes Absolute 0.4  0.1 - 1.0 K/uL   Eosinophils Absolute 0.0  0.0 - 0.7 K/uL   Basophils Absolute 0.0  0.0 - 0.1 K/uL   RBC Morphology RARE NRBCs     WBC Morphology WHITE COUNT CONFIRMED ON SMEAR     Smear Review PLATELET COUNT CONFIRMED BY SMEAR     Comment: PLATELETS APPEAR DECREASED  COMPREHENSIVE METABOLIC PANEL     Status: Abnormal   Collection Time    11/23/12  9:02 PM      Result Value Range   Sodium 136  135 - 145 mEq/L   Potassium 3.1 (*) 3.5 - 5.1 mEq/L   Chloride 99  96 - 112 mEq/L   CO2 25  19 - 32 mEq/L   Glucose, Bld 88  70 - 99 mg/dL   BUN 4 (*) 6 - 23 mg/dL   Creatinine, Ser 5.62  0.50 - 1.35 mg/dL   Calcium 8.7  8.4 - 13.0 mg/dL   Total Protein 7.2  6.0 - 8.3 g/dL   Albumin 3.1 (*) 3.5 - 5.2 g/dL   AST 85 (*) 0 - 37 U/L   ALT 30   0 - 53 U/L   Alkaline Phosphatase 120 (*) 39 - 117 U/L   Total Bilirubin 0.5  0.3 - 1.2 mg/dL   GFR calc non Af Amer >90  >90 mL/min   GFR calc Af Amer >90  >90 mL/min   Comment: (NOTE)     The eGFR has been calculated using the CKD EPI equation.     This calculation has not been validated in all clinical situations.     eGFR's persistently <90 mL/min signify possible Chronic Kidney     Disease.  ETHANOL     Status: Abnormal   Collection Time    11/23/12  9:02 PM      Result Value Range   Alcohol, Ethyl (B) 273 (*) 0 - 11 mg/dL   Comment:            LOWEST DETECTABLE LIMIT FOR     SERUM ALCOHOL IS 11 mg/dL     FOR MEDICAL PURPOSES ONLY  PROTIME-INR     Status: None   Collection Time    11/23/12  9:02 PM      Result Value Range   Prothrombin Time 12.5  11.6 - 15.2 seconds   INR 0.95  0.00 - 1.49    Dg Chest 1 View  11/23/2012   CLINICAL DATA:  Fall with left hip fracture.  EXAM: CHEST - 1 VIEW  COMPARISON:  07/29/2012  FINDINGS: The heart size and mediastinal contours are within normal limits. Both lungs are clear. The visualized skeletal structures are unremarkable.  IMPRESSION: No active disease.   Electronically Signed   By: Irish Lack   On: 11/23/2012 21:05   Dg Femur Left  11/23/2012   CLINICAL DATA:  Fall with left hip pain.  EXAM: LEFT FEMUR - 2 VIEW  COMPARISON:  None.  FINDINGS: Acute intertrochanteric fracture of the proximal left femur shows mild displacement and avulsion of the lesser trochanter. There is some irregularity also along the superior aspect of the femoral neck and a component of a subcapital fracture cannot be excluded. This could be related to proliferative  changes, however. Soft tissues are unremarkable.  IMPRESSION: Acute intertrochanteric fracture of the left hip. Possible component of additional subcapital fracture as well.   Electronically Signed   By: Irish Lack   On: 11/23/2012 21:04    Review of Systems  Gastrointestinal: Positive for  heartburn.  Musculoskeletal: Positive for back pain (Prior surgery.  Not bothering him now.) and falls (Fell off [employer].  Hurt left hip.).  Psychiatric/Behavioral: Positive for depression.       History of ETOH abuse, denies liver problems, smoker, poor diet, thin.   Blood pressure 125/84, pulse 108, temperature 98.1 F (36.7 C), temperature source Oral, resp. rate 16, height 6\' 1"  (1.854 m), weight 62.596 kg (138 lb), SpO2 100.00%. Physical Exam  Constitutional: He is oriented to person, place, and time. He appears well-developed and well-nourished.  HENT:  Head: Normocephalic and atraumatic.  Poor dental hygiene.  Eyes: EOM are normal. Pupils are equal, round, and reactive to light.  Neck: Normal range of motion. Neck supple.  Cardiovascular: Normal rate, regular rhythm and intact distal pulses.   Respiratory: Effort normal.  GI: Soft. Bowel sounds are normal.  Musculoskeletal: He exhibits tenderness (Pain left hip with any motion.  Shortened, external rotation of the left hip.).       Legs: Neurological: He is alert and oriented to person, place, and time. He has normal reflexes.  Skin: Skin is warm and dry.  Psychiatric: He has a normal mood and affect. His behavior is normal. Judgment and thought content normal.    Assessment/Plan: Intertrochanteric fracture of the left hip acute with pre-existing DJD of the hip joint  ETOH abuse, elevated levels in the ER.  Concern for DT's.    Smoker  He will need surgical fixation of the left hip fracture.  I await medical clearance.  He will need SNF placement post operative.    Elchanan Bob 11/23/2012, 9:42 PM

## 2012-11-23 NOTE — ED Notes (Signed)
Pt fell approx 3 steps off of his porch and landed on his left side. Pt c/o pain to left hip 10/10.  Pt denies hitting head or loc, admits to drinking tonight.

## 2012-11-23 NOTE — H&P (Addendum)
PCP:   Leo Grosser, MD   Chief Complaint:  Fall  HPI: 57 -year-old male with history of alcohol abuse, hypertension, depression, COPD who came to the ED after patient fell 3 steps off his porch. Patient denies any passing out, no chest pain. Patient has history of alcohol abuse and drinks alcohol everyday. Alcohol level in the ED is 273. X-ray of the left femur shows acute intertrochanteric fracture left hip. Possible component of a distal subcapital fracture is well. Orthopedics has been consulted by the ED physician. Hospitalist called to admit. Patient complains of left-sided hip pain and rates it 7/10 intensity.  Allergies:  No Known Allergies    Past Medical History  Diagnosis Date  . Depression   . Elevated PSA   . Arthritis   . Hyperlipidemia   . Cataract   . Anxiety   . Bipolar 1 disorder   . ETOH abuse     quit Oct 2013, then relapsed, as of Dec 2013 now has abstained X 1 month  . COPD (chronic obstructive pulmonary disease)   . Asthma   . Allergy   . Hypertension   . GERD (gastroesophageal reflux disease)     Past Surgical History  Procedure Laterality Date  . Colonoscopy  12/21/09    ZOX:WRUE papilla otherwise normal/ pancolonic diverticula/mutiple colonic poylps  . Spine surgery    . Back surgery      lunbar  . Colonoscopy with esophagogastroduodenoscopy (egd) N/A 10/20/2012    Procedure: COLONOSCOPY WITH ESOPHAGOGASTRODUODENOSCOPY (EGD);  Surgeon: Corbin Ade, MD;  Location: [facility];  Service: Endoscopy;  Laterality: N/A;  10;15    Prior to Admission medications   Medication Sig Start Date End Date Taking? Authorizing Provider  amLODipine (NORVASC) 10 MG tablet Take 1 tablet (10 mg total) by mouth daily. 09/01/12  Yes Mary B Dixon, PA-C  hydrochlorothiazide (MICROZIDE) 12.5 MG capsule Take 1 capsule (12.5 mg total) by mouth daily. 10/26/12  Yes Donita Brooks, MD  omeprazole (PRILOSEC) 20 MG capsule Take 1 capsule (20 mg total) by mouth daily.  09/01/12  Yes Mary B Dixon, PA-C  sertraline (ZOLOFT) 100 MG tablet Take 100 mg by mouth 2 (two) times daily.   Yes Historical Provider, MD    Social History:  reports that he has been smoking Cigarettes.  He has a 30 pack-year smoking history. He does not have any smokeless tobacco history on file. He reports that he drinks about 18.0 ounces of alcohol per week. He reports that he does not use illicit drugs.  Family History  Problem Relation Age of Onset  . Colon cancer Neg Hx   . Mental illness Sister      All the positives are listed in BOLD  Review of Systems:  HEENT: Headache, blurred vision, runny nose, sore throat Neck: Hypothyroidism, hyperthyroidism,,lymphadenopathy Chest : Shortness of breath, history of COPD, Asthma Heart : Chest pain, history of coronary arterey disease GI:  Nausea, vomiting, diarrhea, constipation, GERD GU: Dysuria, urgency, frequency of urination, hematuria Neuro: Stroke, seizures, syncope Psych: Depression, anxiety, hallucinations   Physical Exam: Blood pressure 125/84, pulse 108, temperature 98.1 F (36.7 C), temperature source Oral, resp. rate 16, height 6\' 1"  (1.854 m), weight 62.596 kg (138 lb), SpO2 100.00%. Constitutional:   Patient is a well-developed and well-nourished male* in no acute distress and cooperative with exam. Head: Normocephalic and atraumatic Mouth: Mucus membranes moist Eyes: PERRL, EOMI, conjunctivae normal Neck: Supple, No Thyromegaly Cardiovascular: RRR, S1 normal, S2 normal Pulmonary/Chest:  CTAB, no wheezes, rales, or rhonchi Abdominal: Soft. Non-tender, non-distended, bowel sounds are normal, no masses, organomegaly, or guarding present.  Neurological: A&O x3, Strenght is normal and symmetric bilaterally, cranial nerve II-XII are grossly intact, no focal motor deficit, sensory intact to light touch bilaterally.  Extremities : Left lower extremity shortened and externally rotated, tender to palpation in left hip   Labs  on Admission:  Results for orders placed during the hospital encounter of 11/23/12 (from the past 48 hour(s))  CBC WITH DIFFERENTIAL     Status: Abnormal   Collection Time    11/23/12  9:02 PM      Result Value Range   WBC 2.1 (*) 4.0 - 10.5 K/uL   RBC 3.80 (*) 4.22 - 5.81 MIL/uL   Hemoglobin 13.0  13.0 - 17.0 g/dL   HCT 09.8 (*) 11.9 - 14.7 %   MCV 98.4  78.0 - 100.0 fL   MCH 34.2 (*) 26.0 - 34.0 pg   MCHC 34.8  30.0 - 36.0 g/dL   RDW 82.9  56.2 - 13.0 %   Platelets 94 (*) 150 - 400 K/uL   Neutrophils Relative % 19 (*) 43 - 77 %   Lymphocytes Relative 61 (*) 12 - 46 %   Monocytes Relative 18 (*) 3 - 12 %   Eosinophils Relative 0  0 - 5 %   Basophils Relative 2 (*) 0 - 1 %   Neutro Abs 0.4 (*) 1.7 - 7.7 K/uL   Lymphs Abs 1.3  0.7 - 4.0 K/uL   Monocytes Absolute 0.4  0.1 - 1.0 K/uL   Eosinophils Absolute 0.0  0.0 - 0.7 K/uL   Basophils Absolute 0.0  0.0 - 0.1 K/uL   RBC Morphology RARE NRBCs     WBC Morphology WHITE COUNT CONFIRMED ON SMEAR     Smear Review PLATELET COUNT CONFIRMED BY SMEAR     Comment: PLATELETS APPEAR DECREASED  COMPREHENSIVE METABOLIC PANEL     Status: Abnormal   Collection Time    11/23/12  9:02 PM      Result Value Range   Sodium 136  135 - 145 mEq/L   Potassium 3.1 (*) 3.5 - 5.1 mEq/L   Chloride 99  96 - 112 mEq/L   CO2 25  19 - 32 mEq/L   Glucose, Bld 88  70 - 99 mg/dL   BUN 4 (*) 6 - 23 mg/dL   Creatinine, Ser 8.65  0.50 - 1.35 mg/dL   Calcium 8.7  8.4 - 78.4 mg/dL   Total Protein 7.2  6.0 - 8.3 g/dL   Albumin 3.1 (*) 3.5 - 5.2 g/dL   AST 85 (*) 0 - 37 U/L   ALT 30  0 - 53 U/L   Alkaline Phosphatase 120 (*) 39 - 117 U/L   Total Bilirubin 0.5  0.3 - 1.2 mg/dL   GFR calc non Af Amer >90  >90 mL/min   GFR calc Af Amer >90  >90 mL/min   Comment: (NOTE)     The eGFR has been calculated using the CKD EPI equation.     This calculation has not been validated in all clinical situations.     eGFR's persistently <90 mL/min signify possible Chronic  Kidney     Disease.  ETHANOL     Status: Abnormal   Collection Time    11/23/12  9:02 PM      Result Value Range   Alcohol, Ethyl (B) 273 (*) 0 - 11 mg/dL  Comment:            LOWEST DETECTABLE LIMIT FOR     SERUM ALCOHOL IS 11 mg/dL     FOR MEDICAL PURPOSES ONLY  PROTIME-INR     Status: None   Collection Time    11/23/12  9:02 PM      Result Value Range   Prothrombin Time 12.5  11.6 - 15.2 seconds   INR 0.95  0.00 - 1.49    Radiological Exams on Admission: Dg Chest 1 View  11/23/2012   CLINICAL DATA:  Fall with left hip fracture.  EXAM: CHEST - 1 VIEW  COMPARISON:  07/29/2012  FINDINGS: The heart size and mediastinal contours are within normal limits. Both lungs are clear. The visualized skeletal structures are unremarkable.  IMPRESSION: No active disease.   Electronically Signed   By: Irish Lack   On: 11/23/2012 21:05   Dg Femur Left  11/23/2012   CLINICAL DATA:  Fall with left hip pain.  EXAM: LEFT FEMUR - 2 VIEW  COMPARISON:  None.  FINDINGS: Acute intertrochanteric fracture of the proximal left femur shows mild displacement and avulsion of the lesser trochanter. There is some irregularity also along the superior aspect of the femoral neck and a component of a subcapital fracture cannot be excluded. This could be related to proliferative changes, however. Soft tissues are unremarkable.  IMPRESSION: Acute intertrochanteric fracture of the left hip. Possible component of additional subcapital fracture as well.   Electronically Signed   By: Irish Lack   On: 11/23/2012 21:04    Assessment/Plan Active Problems:   DEPRESSION   ETOH abuse   Hip fracture   HTN (hypertension)  hypokalemia  Left femur fracture Patient will be admitted and will initiate hip fracture protocol Orthopedics Dr. Hilda Lias has been consulted We'll continue morphine when necessary for pain  EtOH abuse Patient has alcohol level of 273 Counseled against alcohol use Will initiate alcohol withdrawal  precautions  Hypokalemia Replace potassium Check BMP in the morning  Hypertension Continue antihypertensive medications BP stable at this time  Depression Continue Zoloft  Pancytopenia Chronic, secondary to alcohol abuse  Code status: Full code  Family discussion: Discussed with cousin bedside   Time Spent on Admission: 75 min  Danny Zimny S [facility] Pager: [phone] 11/23/2012, 10:07 PM  If 7PM-7AM, please contact night-coverage  [email or web address]  Password [email or web address]

## 2012-11-23 NOTE — ED Provider Notes (Signed)
CSN: [id number]     Arrival date & time 11/23/12  1907 History  This chart was scribed for Roney Marion, MD by Blanchard Kelch, ED Scribe. The patient was seen in room [facility]14/[facility]14. Patient's care was started at 7:29 PM.    Chief Complaint  Patient presents with  . Fall  . Hip Injury    Patient is a 57 y.o. male presenting with fall.  Fall Pertinent negatives include no chest pain, no abdominal pain, no headaches and no shortness of breath.    HPI Comments: Duane Richardson is a 57 y.o. male who presents to the Emergency Department due to a fall that occurred just prior to arrival when he fell three steps off his porch. He reports landing on his left hip. He denies injury to his head or spine or syncope. He has constant, severe pain to his left hip onset after the fall. He is unable to walk due to the pain. He denies a past medical history of diabetes or cancer.   Past Medical History  Diagnosis Date  . Depression   . Elevated PSA   . Arthritis   . Hyperlipidemia   . Cataract   . Anxiety   . Bipolar 1 disorder   . ETOH abuse     quit Oct 2013, then relapsed, as of Dec 2013 now has abstained X 1 month  . COPD (chronic obstructive pulmonary disease)   . Asthma   . Allergy   . Hypertension   . GERD (gastroesophageal reflux disease)    Past Surgical History  Procedure Laterality Date  . Colonoscopy  12/21/09    NWG:NFAO papilla otherwise normal/ pancolonic diverticula/mutiple colonic poylps  . Spine surgery    . Back surgery      lunbar  . Colonoscopy with esophagogastroduodenoscopy (egd) N/A 10/20/2012    Procedure: COLONOSCOPY WITH ESOPHAGOGASTRODUODENOSCOPY (EGD);  Surgeon: Corbin Ade, MD;  Location: [facility];  Service: Endoscopy;  Laterality: N/A;  10;15   Family History  Problem Relation Age of Onset  . Colon cancer Neg Hx   . Mental illness Sister    History  Substance Use Topics  . Smoking status: Current Every Day Smoker -- 1.00 packs/day for 30 years     Types: Cigars  . Smokeless tobacco: Not on file  . Alcohol Use: 18.0 oz/week    30 Glasses of wine per week     Comment: No ETOH in 2 weeks . about a liter of wine per day-before that    Review of Systems  Constitutional: Negative for fever, chills, diaphoresis, appetite change and fatigue.  HENT: Negative for sore throat, mouth sores and trouble swallowing.   Eyes: Negative for visual disturbance.  Respiratory: Negative for cough, chest tightness, shortness of breath and wheezing.   Cardiovascular: Negative for chest pain.  Gastrointestinal: Negative for nausea, vomiting, abdominal pain, diarrhea and abdominal distention.  Endocrine: Negative for polydipsia, polyphagia and polyuria.  Genitourinary: Negative for dysuria, frequency and hematuria.  Musculoskeletal: Positive for arthralgias (hip pain). Negative for gait problem.  Skin: Negative for color change, pallor and rash.  Neurological: Negative for dizziness, syncope, light-headedness and headaches.  Hematological: Does not bruise/bleed easily.  Psychiatric/Behavioral: Negative for behavioral problems and confusion.    Allergies  Review of patient's allergies indicates no known allergies.  Home Medications   No current outpatient prescriptions on file. Triage Vitals: BP 134/92  Pulse 103  Temp(Src) 98.1 F (36.7 C) (Oral)  Resp 20  Ht 6'  1" (1.854 m)  Wt 138 lb (62.596 kg)  BMI 18.21 kg/m2  SpO2 98%  Physical Exam  Nursing note and vitals reviewed. Constitutional: He is oriented to person, place, and time. He appears well-developed and well-nourished. No distress.  HENT:  Head: Normocephalic.  Eyes: Conjunctivae are normal. Pupils are equal, round, and reactive to light. No scleral icterus.  Neck: Normal range of motion. Neck supple. No thyromegaly present.  Cardiovascular: Normal rate and regular rhythm.  Exam reveals no gallop and no friction rub.   No murmur heard. Pulmonary/Chest: Effort normal and breath  sounds normal. No respiratory distress. He has no wheezes. He has no rales.  Abdominal: Soft. Bowel sounds are normal. He exhibits no distension. There is no tenderness. There is no rebound.  Musculoskeletal: Normal range of motion. He exhibits tenderness.  Tenderness to anterior aspect of left hip.  Neurological: He is alert and oriented to person, place, and time.  Skin: Skin is warm and dry. No rash noted.  Psychiatric: He has a normal mood and affect. His behavior is normal.    ED Course  Procedures (including critical care time)  DIAGNOSTIC STUDIES: Oxygen Saturation is 98% on room air, normal by my interpretation.    COORDINATION OF CARE: 7:33 PM -Will order Zofran and Morphine injections as well as left femur x-ray. Patient verbalizes understanding and agrees with treatment plan.    Labs Review Labs Reviewed  CBC WITH DIFFERENTIAL - Abnormal; Notable for the following:    WBC 2.1 (*)    RBC 3.80 (*)    HCT 37.4 (*)    MCH 34.2 (*)    Platelets 94 (*)    Neutrophils Relative % 19 (*)    Lymphocytes Relative 61 (*)    Monocytes Relative 18 (*)    Basophils Relative 2 (*)    Neutro Abs 0.4 (*)    All other components within normal limits  COMPREHENSIVE METABOLIC PANEL - Abnormal; Notable for the following:    Potassium 3.1 (*)    BUN 4 (*)    Albumin 3.1 (*)    AST 85 (*)    Alkaline Phosphatase 120 (*)    All other components within normal limits  ETHANOL - Abnormal; Notable for the following:    Alcohol, Ethyl (B) 273 (*)    All other components within normal limits  URINALYSIS, ROUTINE W REFLEX MICROSCOPIC - Abnormal; Notable for the following:    Color, Urine RED (*)    APPearance CLOUDY (*)    Glucose, UA 250 (*)    Hgb urine dipstick LARGE (*)    Ketones, ur TRACE (*)    Urobilinogen, UA 4.0 (*)    Leukocytes, UA SMALL (*)    All other components within normal limits  CBC WITH DIFFERENTIAL - Abnormal; Notable for the following:    RBC 3.32 (*)     Hemoglobin 11.2 (*)    HCT 32.9 (*)    Platelets 86 (*)    All other components within normal limits  BASIC METABOLIC PANEL - Abnormal; Notable for the following:    Glucose, Bld 104 (*)    BUN 4 (*)    Creatinine, Ser 0.47 (*)    Calcium 7.9 (*)    All other components within normal limits  BASIC METABOLIC PANEL - Abnormal; Notable for the following:    Sodium 133 (*)    Glucose, Bld 133 (*)    BUN 4 (*)    Creatinine, Ser 0.37 (*)  Calcium 8.1 (*)    All other components within normal limits  CBC WITH DIFFERENTIAL - Abnormal; Notable for the following:    RBC 2.61 (*)    Hemoglobin 8.8 (*)    HCT 25.9 (*)    Platelets 145 (*)    All other components within normal limits  URINE MICROSCOPIC-ADD ON - Abnormal; Notable for the following:    Bacteria, UA FEW (*)    All other components within normal limits  BASIC METABOLIC PANEL - Abnormal; Notable for the following:    Sodium 131 (*)    Glucose, Bld 133 (*)    BUN 4 (*)    Creatinine, Ser 0.40 (*)    All other components within normal limits  CBC WITH DIFFERENTIAL - Abnormal; Notable for the following:    WBC 11.7 (*)    RBC 3.31 (*)    Hemoglobin 11.0 (*)    HCT 31.3 (*)    RDW 16.5 (*)    Platelets 126 (*)    Neutrophils Relative % 79 (*)    Neutro Abs 9.2 (*)    Lymphocytes Relative 8 (*)    Monocytes Relative 13 (*)    Monocytes Absolute 1.6 (*)    All other components within normal limits  CBC WITH DIFFERENTIAL - Abnormal; Notable for the following:    WBC 11.6 (*)    RBC 2.99 (*)    Hemoglobin 9.9 (*)    HCT 28.5 (*)    RDW 16.2 (*)    Platelets 126 (*)    Neutro Abs 8.4 (*)    Lymphocytes Relative 9 (*)    Monocytes Relative 19 (*)    Monocytes Absolute 2.2 (*)    All other components within normal limits  COMPREHENSIVE METABOLIC PANEL - Abnormal; Notable for the following:    Sodium 132 (*)    Potassium 3.2 (*)    Glucose, Bld 106 (*)    Creatinine, Ser 0.42 (*)    Albumin 2.4 (*)    All other  components within normal limits  CBC - Abnormal; Notable for the following:    RBC 3.06 (*)    Hemoglobin 10.1 (*)    HCT 29.1 (*)    RDW 15.7 (*)    All other components within normal limits  COMPREHENSIVE METABOLIC PANEL - Abnormal; Notable for the following:    Sodium 133 (*)    Potassium 3.0 (*)    Glucose, Bld 106 (*)    Creatinine, Ser 0.36 (*)    Albumin 2.4 (*)    AST 46 (*)    All other components within normal limits  SURGICAL PCR SCREEN  URINE CULTURE  PROTIME-INR  PREPARE RBC (CROSSMATCH)  TYPE AND SCREEN  ABO/RH  PREPARE PLATELET PHERESIS   Imaging Review  No results found.  MDM   1. Alcohol intoxication   2. Depressive disorder, not elsewhere classified   3. HTN (hypertension)   4. Thrombocytopenia, unspecified   5. Hip fracture, left, closed, initial encounter   6. Tobacco use disorder   7. ETOH abuse   8. Alcohol withdrawal    X-rays show fractured hip. Care discussed between myself and the hospitalist, as well as orthopedic surgeon. Both are here planning admission   Roney Marion, MD 11/28/12 [phone]

## 2012-11-24 ENCOUNTER — Inpatient Hospital Stay (HOSPITAL_COMMUNITY): Payer: Medicaid Other | Admitting: Anesthesiology

## 2012-11-24 ENCOUNTER — Encounter (HOSPITAL_COMMUNITY): Payer: Self-pay | Admitting: Anesthesiology

## 2012-11-24 ENCOUNTER — Encounter (HOSPITAL_COMMUNITY)
Admission: EM | Disposition: A | Discharge status: Skilled Nursing Facility | Payer: Self-pay | Source: Home / Self Care | Attending: Internal Medicine

## 2012-11-24 ENCOUNTER — Encounter (HOSPITAL_COMMUNITY): Payer: Self-pay | Admitting: *Deleted

## 2012-11-24 ENCOUNTER — Inpatient Hospital Stay (HOSPITAL_COMMUNITY): Payer: Medicaid Other

## 2012-11-24 HISTORY — PX: ORIF HIP FRACTURE: SHX2125

## 2012-11-24 LAB — CBC WITH DIFFERENTIAL/PLATELET
Basophils Absolute: 0 10*3/uL (ref 0.0–0.1)
Basophils Relative: 0 % (ref 0–1)
Eosinophils Absolute: 0 10*3/uL (ref 0.0–0.7)
Eosinophils Relative: 0 % (ref 0–5)
HCT: 32.9 % — ABNORMAL LOW (ref 39.0–52.0)
Hemoglobin: 11.2 g/dL — ABNORMAL LOW (ref 13.0–17.0)
Lymphocytes Relative: 24 % (ref 12–46)
Lymphs Abs: 1.1 10*3/uL (ref 0.7–4.0)
MCH: 33.7 pg (ref 26.0–34.0)
MCHC: 34 g/dL (ref 30.0–36.0)
MCV: 99.1 fL (ref 78.0–100.0)
Monocytes Absolute: 0.5 10*3/uL (ref 0.1–1.0)
Monocytes Relative: 10 % (ref 3–12)
Neutro Abs: 3.1 10*3/uL (ref 1.7–7.7)
Neutrophils Relative %: 66 % (ref 43–77)
Platelets: 86 10*3/uL — ABNORMAL LOW (ref 150–400)
RBC: 3.32 MIL/uL — ABNORMAL LOW (ref 4.22–5.81)
RDW: 14.5 % (ref 11.5–15.5)
WBC: 4.7 10*3/uL (ref 4.0–10.5)

## 2012-11-24 LAB — SURGICAL PCR SCREEN
MRSA, PCR: NEGATIVE
Staphylococcus aureus: NEGATIVE

## 2012-11-24 LAB — BASIC METABOLIC PANEL
BUN: 4 mg/dL — ABNORMAL LOW (ref 6–23)
CO2: 21 mEq/L (ref 19–32)
Calcium: 7.9 mg/dL — ABNORMAL LOW (ref 8.4–10.5)
Chloride: 103 mEq/L (ref 96–112)
Creatinine, Ser: 0.47 mg/dL — ABNORMAL LOW (ref 0.50–1.35)
GFR calc Af Amer: >90 mL/min (ref >90)
GFR calc non Af Amer: >90 mL/min (ref >90)
Glucose, Bld: 104 mg/dL — ABNORMAL HIGH (ref 70–99)
Potassium: 4.1 mEq/L (ref 3.5–5.1)
Sodium: 135 mEq/L (ref 135–145)

## 2012-11-24 SURGERY — OPEN REDUCTION INTERNAL FIXATION HIP
Anesthesia: Spinal | Site: Hip | Laterality: Left | Wound class: Clean

## 2012-11-24 MED ORDER — EPHEDRINE SULFATE 50 MG/ML IJ SOLN
INTRAMUSCULAR | Status: DC | PRN
  Administered 2012-11-24 (×2): 10 mg via INTRAVENOUS

## 2012-11-24 MED ORDER — PROPOFOL INFUSION 10 MG/ML OPTIME
INTRAVENOUS | Status: DC | PRN
  Administered 2012-11-24: 25 ug/kg/min via INTRAVENOUS

## 2012-11-24 MED ORDER — SODIUM CHLORIDE 0.9 % IR SOLN
Status: DC | PRN
  Administered 2012-11-24: 1000 mL

## 2012-11-24 MED ORDER — POVIDONE-IODINE 10 % EX SOLN
CUTANEOUS | Status: AC
  Filled 2012-11-24: qty 118

## 2012-11-24 MED ORDER — ENOXAPARIN SODIUM 40 MG/0.4ML ~~LOC~~ SOLN
40.0000 mg | SUBCUTANEOUS | Status: DC
  Administered 2012-11-25 – 2012-12-01 (×7): 40 mg via SUBCUTANEOUS
  Filled 2012-11-24 (×7): qty 0.4

## 2012-11-24 MED ORDER — ONDANSETRON HCL 4 MG/2ML IJ SOLN: 4.0000 mg | Freq: Once | INTRAMUSCULAR | Status: DC | PRN

## 2012-11-24 MED ORDER — MAGNESIUM HYDROXIDE 400 MG/5ML PO SUSP
30.0000 mL | Freq: Every day | ORAL | Status: DC | PRN
  Administered 2012-11-28 – 2012-11-29 (×2): 30 mL via ORAL
  Filled 2012-11-24 (×3): qty 30

## 2012-11-24 MED ORDER — BUPIVACAINE IN DEXTROSE 0.75-8.25 % IT SOLN
INTRATHECAL | Status: DC | PRN
  Administered 2012-11-24: 15 mg via INTRATHECAL

## 2012-11-24 MED ORDER — ALBUTEROL SULFATE (5 MG/ML) 0.5% IN NEBU
2.5000 mg | INHALATION_SOLUTION | Freq: Four times a day (QID) | RESPIRATORY_TRACT | Status: DC
  Administered 2012-11-24 – 2012-11-25 (×3): 2.5 mg via RESPIRATORY_TRACT
  Filled 2012-11-24 (×3): qty 0.5

## 2012-11-24 MED ORDER — CEFAZOLIN SODIUM-DEXTROSE 2-3 GM-% IV SOLR
2.0000 g | INTRAVENOUS | Status: AC
  Administered 2012-11-24: 2 g via INTRAVENOUS
  Filled 2012-11-24: qty 50

## 2012-11-24 MED ORDER — LABETALOL HCL 5 MG/ML IV SOLN
10.0000 mg | INTRAVENOUS | Status: DC | PRN
  Administered 2012-11-24: 10 mg via INTRAVENOUS

## 2012-11-24 MED ORDER — FENTANYL CITRATE 0.05 MG/ML IJ SOLN
INTRAMUSCULAR | Status: AC
  Filled 2012-11-24: qty 2

## 2012-11-24 MED ORDER — MIDAZOLAM HCL 5 MG/5ML IJ SOLN
INTRAMUSCULAR | Status: DC | PRN
  Administered 2012-11-24 (×2): 2 mg via INTRAVENOUS

## 2012-11-24 MED ORDER — HYDROGEN PEROXIDE 3 % EX SOLN
CUTANEOUS | Status: DC | PRN
  Administered 2012-11-24: 1 via TOPICAL

## 2012-11-24 MED ORDER — FENTANYL CITRATE 0.05 MG/ML IJ SOLN
25.0000 ug | INTRAMUSCULAR | Status: DC
  Administered 2012-11-24: 25 ug via INTRAVENOUS
  Filled 2012-11-24: qty 2

## 2012-11-24 MED ORDER — MIDAZOLAM HCL 2 MG/2ML IJ SOLN
INTRAMUSCULAR | Status: AC
  Filled 2012-11-24: qty 2

## 2012-11-24 MED ORDER — PROPOFOL 10 MG/ML IV EMUL
INTRAVENOUS | Status: AC
  Filled 2012-11-24: qty 20

## 2012-11-24 MED ORDER — LABETALOL HCL 5 MG/ML IV SOLN
INTRAVENOUS | Status: AC
  Filled 2012-11-24: qty 4

## 2012-11-24 MED ORDER — FENTANYL CITRATE 0.05 MG/ML IJ SOLN
INTRAMUSCULAR | Status: DC | PRN
  Administered 2012-11-24: 25 ug via INTRAVENOUS
  Administered 2012-11-24: 50 ug via INTRAVENOUS

## 2012-11-24 MED ORDER — MIDAZOLAM HCL 2 MG/2ML IJ SOLN
1.0000 mg | INTRAMUSCULAR | Status: DC | PRN
  Administered 2012-11-24: 2 mg via INTRAVENOUS

## 2012-11-24 MED ORDER — ACETAMINOPHEN 325 MG PO TABS
650.0000 mg | ORAL_TABLET | Freq: Four times a day (QID) | ORAL | Status: DC | PRN
  Administered 2012-11-28 – 2012-11-29 (×2): 650 mg via ORAL
  Filled 2012-11-24 (×2): qty 2

## 2012-11-24 MED ORDER — LACTATED RINGERS IV SOLN
INTRAVENOUS | Status: DC
  Administered 2012-11-24 (×2): via INTRAVENOUS

## 2012-11-24 MED ORDER — BUPIVACAINE IN DEXTROSE 0.75-8.25 % IT SOLN
INTRATHECAL | Status: AC
  Filled 2012-11-24: qty 2

## 2012-11-24 MED ORDER — FENTANYL CITRATE 0.05 MG/ML IJ SOLN: 25.0000 ug | INTRAMUSCULAR | Status: DC | PRN

## 2012-11-24 MED ORDER — EPHEDRINE SULFATE 50 MG/ML IJ SOLN
INTRAMUSCULAR | Status: AC
  Filled 2012-11-24: qty 1

## 2012-11-24 SURGICAL SUPPLY — 54 items
BAG HAMPER (MISCELLANEOUS) ×2 IMPLANT
BIT DRILL TWIST 3.5MM (BIT) ×1 IMPLANT
BLADE HEX COATED 2.75 (ELECTRODE) ×1 IMPLANT
BLADE SURG SZ10 CARB STEEL (BLADE) ×3 IMPLANT
BLADE SURG SZ20 CARB STEEL (BLADE) ×2 IMPLANT
CLOTH BEACON ORANGE TIMEOUT ST (SAFETY) ×2 IMPLANT
COVER LIGHT HANDLE STERIS (MISCELLANEOUS) ×4 IMPLANT
COVER MAYO STAND XLG (DRAPE) ×2 IMPLANT
DRAPE STERI IOBAN 125X83 (DRAPES) ×2 IMPLANT
DRILL TWIST 3.5MM (BIT) ×2
ELECT REM PT RETURN 9FT ADLT (ELECTROSURGICAL) ×2
ELECTRODE REM PT RTRN 9FT ADLT (ELECTROSURGICAL) ×1 IMPLANT
EVACUATOR 3/16  PVC DRAIN (DRAIN) ×1
EVACUATOR 3/16 PVC DRAIN (DRAIN) ×1 IMPLANT
GAUZE XEROFORM 5X9 LF (GAUZE/BANDAGES/DRESSINGS) ×2 IMPLANT
GLOVE BIO SURGEON STRL SZ7 (GLOVE) ×1 IMPLANT
GLOVE BIO SURGEON STRL SZ8 (GLOVE) ×3 IMPLANT
GLOVE BIO SURGEON STRL SZ8.5 (GLOVE) ×2 IMPLANT
GLOVE BIOGEL PI IND STRL 7.0 (GLOVE) IMPLANT
GLOVE BIOGEL PI IND STRL 7.5 (GLOVE) IMPLANT
GLOVE BIOGEL PI INDICATOR 7.0 (GLOVE) ×3
GLOVE BIOGEL PI INDICATOR 7.5 (GLOVE) ×1
GLOVE ECLIPSE 6.5 STRL STRAW (GLOVE) ×1 IMPLANT
GLOVE SS BIOGEL STRL SZ 6.5 (GLOVE) IMPLANT
GLOVE SUPERSENSE BIOGEL SZ 6.5 (GLOVE) ×1
GOWN STRL REIN XL XLG (GOWN DISPOSABLE) ×6 IMPLANT
GUIDE PIN CALIBRATED (PIN) ×2 IMPLANT
GUIDE PIN CALIBRATED 2.4X23 (PIN) ×1 IMPLANT
INST SET MAJOR BONE (KITS) ×2 IMPLANT
KIT BLADEGUARD II DBL (SET/KITS/TRAYS/PACK) ×2 IMPLANT
KIT ROOM TURNOVER AP CYSTO (KITS) ×2 IMPLANT
MANIFOLD NEPTUNE II (INSTRUMENTS) ×2 IMPLANT
MARKER SKIN DUAL TIP RULER LAB (MISCELLANEOUS) ×2 IMPLANT
NS IRRIG 1000ML POUR BTL (IV SOLUTION) ×2 IMPLANT
PACK BASIC III (CUSTOM PROCEDURE TRAY) ×2
PACK SRG BSC III STRL LF ECLPS (CUSTOM PROCEDURE TRAY) ×1 IMPLANT
PAD ABD 5X9 TENDERSORB (GAUZE/BANDAGES/DRESSINGS) ×4 IMPLANT
PAD ARMBOARD 7.5X6 YLW CONV (MISCELLANEOUS) ×2 IMPLANT
PENCIL HANDSWITCHING (ELECTRODE) ×2 IMPLANT
PLATE SHORT BARREL 135X4 (Plate) ×1 IMPLANT
SCREW CORTICAL SFTP 4.5X40MM (Screw) ×2 IMPLANT
SCREW CORTICAL SFTP 4.5X44MM (Screw) ×2 IMPLANT
SCREW LAG 100MM (Screw) ×1 IMPLANT
SET BASIN LINEN APH (SET/KITS/TRAYS/PACK) ×2 IMPLANT
SPONGE DRAIN TRACH 4X4 STRL 2S (GAUZE/BANDAGES/DRESSINGS) ×1 IMPLANT
SPONGE GAUZE 4X4 12PLY (GAUZE/BANDAGES/DRESSINGS) ×2 IMPLANT
SPONGE LAP 18X18 X RAY DECT (DISPOSABLE) ×4 IMPLANT
STAPLER VISISTAT 35W (STAPLE) ×2 IMPLANT
SUT BRALON NAB BRD #1 30IN (SUTURE) ×5 IMPLANT
SUT PLAIN 2 0 XLH (SUTURE) ×3 IMPLANT
SUT SILK 0 FSL (SUTURE) ×2 IMPLANT
SYR BULB IRRIGATION 50ML (SYRINGE) ×2 IMPLANT
TAPE MEDIFIX FOAM 3 (GAUZE/BANDAGES/DRESSINGS) ×2 IMPLANT
YANKAUER SUCT 12FT TUBE ARGYLE (SUCTIONS) ×2 IMPLANT

## 2012-11-24 NOTE — Progress Notes (Signed)
Dr. Hilda Lias requested that pt not receive Lovenox pre-op due to bleeding risk. 0800 dose was not given. Sheryn Bison

## 2012-11-24 NOTE — Anesthesia Procedure Notes (Signed)
Spinal  Patient location during procedure: OR Start time: 11/24/2012 12:36 PM Staffing CRNA/Resident: Glynn Octave E Preanesthetic Checklist Completed: patient identified, site marked, surgical consent, pre-op evaluation, timeout performed, IV checked, risks and benefits discussed and monitors and equipment checked Spinal Block Patient position: left lateral decubitus Prep: Betadine Patient monitoring: heart rate, cardiac monitor, continuous pulse ox and blood pressure Approach: left paramedian Location: L3-4 Injection technique: single-shot Needle Needle type: Spinocan  Needle gauge: 22 G Needle length: 9 cm Assessment Sensory level: T8 Additional Notes  ATTEMPTS:3, 2 attempts by CRNA,median and paramedian without success.  Dr. Jayme Cloud called in and attempted Lt paramedian approach with success. TRAY ID: [id number] TRAY EXPIRATION DATE:01/2013

## 2012-11-24 NOTE — Progress Notes (Signed)
Awake. Sips of ginger-ale given. Tolerated well. Small amt movement of right leg.

## 2012-11-24 NOTE — Progress Notes (Signed)
UR chart review completed.  

## 2012-11-24 NOTE — Progress Notes (Signed)
[facility] PROGRESS NOTE  Duane Richardson Duane Richardson OZH:[id number] DOB: 1955/06/17 DOA: 11/23/2012 PCP: Leo Grosser, MD  Assessment/Plan: 57 -year-old male with history of alcohol abuse, smoker, hypertension, depression, COPD who came to the ED after patient fell 3 steps off his porch  1. Left femur fracture;  -patient is at medically acceptable risk for surgery; ECG NSR mild tachycardia; CXR no infiltrate  -defer to Orthopedics; DVT prophylaxis per ortho; morphine when necessary for pain   2. EtOH abuse; denies withdrawals -Patient has alcohol level of 273 on admission; Counseled against alcohol use  -cont alcohol withdrawal precautions   3. Hypokalemia Replace potassium; resolved   4. Hypertension Continue antihypertensive medications  -BP stable at this time   5. Depression continue Zoloft   6. Pancytopenia  Chronic, secondary to alcohol abuse   Code Status: full  Family Communication: none at the bedside (indicate person spoken with, relationship, and if by phone, the number) Disposition Plan: SNF    Consultants:  Orthopedics   Procedures:  Pend hip surgery   Antibiotics:  None  (indicate start date, and stop date if known)  HPI/Subjective: Alert, oriented   Objective: Filed Vitals:   11/24/12 0442  BP: 138/92  Pulse: 86  Temp: 97.7 F (36.5 C)  Resp: 18    Intake/Output Summary (Last 24 hours) at 11/24/12 0901 Last data filed at 11/24/12 0600  Gross per 24 hour  Intake    420 ml  Output    650 ml  Net   -230 ml   Filed Weights   11/23/12 1912  Weight: 62.596 kg (138 lb)    Exam:   General:  Alert   Cardiovascular: s1,s2 rrr  Respiratory: cta BL   Abdomen: soft, nt nd   Musculoskeletal: NO le edema    Data Reviewed: Basic Metabolic Panel:  Recent Labs Lab 11/23/12 2102 11/24/12 0512  NA 136 135  K 3.1* 4.1  CL 99 103  CO2 25 21  GLUCOSE 88 104*  BUN 4* 4*  CREATININE 0.58 0.47*  CALCIUM 8.7 7.9*   Liver Function  Tests:  Recent Labs Lab 11/23/12 2102  AST 85*  ALT 30  ALKPHOS 120*  BILITOT 0.5  PROT 7.2  ALBUMIN 3.1*   No results found for this basename: LIPASE, AMYLASE,  in the last 168 hours No results found for this basename: AMMONIA,  in the last 168 hours CBC:  Recent Labs Lab 11/23/12 2102 11/24/12 0512  WBC 2.1* 4.7  NEUTROABS 0.4* 3.1  HGB 13.0 11.2*  HCT 37.4* 32.9*  MCV 98.4 99.1  PLT 94* 86*   Cardiac Enzymes: No results found for this basename: CKTOTAL, CKMB, CKMBINDEX, TROPONINI,  in the last 168 hours BNP (last 3 results) No results found for this basename: PROBNP,  in the last 8760 hours CBG: No results found for this basename: GLUCAP,  in the last 168 hours  Recent Results (from the past 240 hour(s))  SURGICAL PCR SCREEN     Status: None   Collection Time    11/23/12 11:43 PM      Result Value Range Status   MRSA, PCR NEGATIVE  NEGATIVE Final   Staphylococcus aureus NEGATIVE  NEGATIVE Final   Comment:            The Xpert SA Assay (FDA     approved for NASAL specimens     in patients over 26 years of age),     is one component of     a  comprehensive surveillance     program.  Test performance has     been validated by [facility] for patients greater     than or equal to 67 year old.     It is not intended     to diagnose infection nor to     guide or monitor treatment.     Studies: Dg Chest 1 View  11/23/2012   CLINICAL DATA:  Fall with left hip fracture.  EXAM: CHEST - 1 VIEW  COMPARISON:  07/29/2012  FINDINGS: The heart size and mediastinal contours are within normal limits. Both lungs are clear. The visualized skeletal structures are unremarkable.  IMPRESSION: No active disease.   Electronically Signed   By: Irish Lack   On: 11/23/2012 21:05   Dg Femur Left  11/23/2012   CLINICAL DATA:  Fall with left hip pain.  EXAM: LEFT FEMUR - 2 VIEW  COMPARISON:  None.  FINDINGS: Acute intertrochanteric fracture of the proximal left femur shows  mild displacement and avulsion of the lesser trochanter. There is some irregularity also along the superior aspect of the femoral neck and a component of a subcapital fracture cannot be excluded. This could be related to proliferative changes, however. Soft tissues are unremarkable.  IMPRESSION: Acute intertrochanteric fracture of the left hip. Possible component of additional subcapital fracture as well.   Electronically Signed   By: Irish Lack   On: 11/23/2012 21:04    Scheduled Meds: . amLODipine  10 mg Oral Daily  . enoxaparin (LOVENOX) injection  40 mg Subcutaneous Q24H  . folic acid  1 mg Oral Daily  . multivitamin with minerals  1 tablet Oral Daily  . pantoprazole  40 mg Oral Daily  . sertraline  100 mg Oral BID  . thiamine  100 mg Oral Daily   Or  . thiamine  100 mg Intravenous Daily   Continuous Infusions: . sodium chloride 75 mL/hr at 11/24/12 0600    Active Problems:   DEPRESSION   ETOH abuse   Hip fracture   HTN (hypertension)    Time spent: > 35 minutes     Esperanza Sheets  [facility] Pager [phone]. If 7PM-7AM, please contact night-coverage at [email or web address], password [facility] 11/24/2012, 9:01 AM  LOS: 1 day

## 2012-11-24 NOTE — Anesthesia Postprocedure Evaluation (Signed)
  Anesthesia Post-op Note  Patient: Duane Richardson  Procedure(s) Performed: Procedure(s): OPEN REDUCTION INTERNAL FIXATION HIP (Left)  Patient Location: PACU  Anesthesia Type:Spinal  Level of Consciousness: awake  Airway and Oxygen Therapy: Patient Spontanous Breathing and Patient connected to face mask oxygen  Post-op Pain: none  Post-op Assessment: Post-op Vital signs reviewed, Patient's Cardiovascular Status Stable, Respiratory Function Stable, Patent Airway and No signs of Nausea or vomiting  Post-op Vital Signs: Reviewed and stable, 129/80, 85, 13, 100, 36.4  Complications: No apparent anesthesia complications

## 2012-11-24 NOTE — Care Management Note (Addendum)
    Page 1 of 1   11/30/2012     9:41:09 AM   CARE MANAGEMENT NOTE 11/30/2012  Patient:  Duane Richardson, Duane Richardson   Account Number:  [id number]  Date Initiated:  11/24/2012  Documentation initiated by:  Sharrie Rothman  Subjective/Objective Assessment:   Pt admitted from home with hip fracture. Pt lives with his brother but may need SNF at discharge. Pt stated that he has a cane and crutches for home use. Pt does admit to drinking wine dailly.     Action/Plan:   Pt for surgery 11/24/12. CSW is aware that pt may need SNF at discharge. Will await PT recommendations. IF pt is ok to return home, pt will only receive 3 PT visits due to Medicaid.   Anticipated DC Date:  11/27/2012   Anticipated DC Plan:  SKILLED NURSING FACILITY  In-house referral  Clinical Social Worker      DC Planning Services  CM consult      Choice offered to / List presented to:             Status of service:  Completed, signed off Medicare Important Message given?   (If response is "NO", the following Medicare IM given date fields will be blank) Date Medicare IM given:   Date Additional Medicare IM given:    Discharge Disposition:  SKILLED NURSING FACILITY  Per UR Regulation:    If discussed at Long Length of Stay Meetings, dates discussed:   11/30/2012    Comments:  11/30/12 0940 Arlyss Queen, RN BSN CM Pt discharged to [facility] today. CSW to arrange discharge to facility.  11/24/12 1033 Arlyss Queen, RN BSN CM

## 2012-11-24 NOTE — Brief Op Note (Signed)
11/23/2012 - 11/24/2012  1:39 PM  PATIENT:  Ellin Mayhew Scalf  57 y.o. male  PRE-OPERATIVE DIAGNOSIS:  Left Intertrochenteric fractrue  POST-OPERATIVE DIAGNOSIS:  Left Intertrochenteric fractrue  PROCEDURE:  Procedure(s): OPEN REDUCTION INTERNAL FIXATION HIP (Left)  SURGEON:  Surgeon(s) and Role:    * Darreld Mclean, MD - Primary  PHYSICIAN ASSISTANT:   ASSISTANTS: C. Wrenn    ANESTHESIA:   spinal  EBL:     BLOOD ADMINISTERED:none  DRAINS: (Large) Hemovact drain(s) in the left hip area with  Suction Open   LOCAL MEDICATIONS USED:  NONE  SPECIMEN:  No Specimen  DISPOSITION OF SPECIMEN:  N/A  COUNTS:  YES  TOURNIQUET:  * No tourniquets in log *  DICTATION: .Other Dictation: Dictation Number [id number]  PLAN OF CARE: Admit to inpatient   PATIENT DISPOSITION:  PACU - hemodynamically stable.   Delay start of Pharmacological VTE agent (>24hrs) due to surgical blood loss or risk of bleeding: no

## 2012-11-24 NOTE — Progress Notes (Addendum)
INITIAL NUTRITION ASSESSMENT  DOCUMENTATION CODES Per approved criteria  -Underweight   INTERVENTION: Follow for diet advancement  NUTRITION DIAGNOSIS: Inadequate oral intake related to surgery as evidenced by NPO.   Goal: Pt will meet >90% of estimated nutritional needs  Monitor:  Diet advancement and tolerance, weight changes, labs, skin integrity, changes in status  Reason for Assessment: MST=2, MD consult (hip fx)  57 y.o. male  Admitting Dx: <principal problem not specified>  ASSESSMENT: Pt admitted due to lt femur fx. Per MD notes, pt will require surgery and SNF for rehab. Pt for surgery today. Pt with hx of ETOH abuse, however, stopped drinking 2 weeks ago. Wt hx reveals that wt has been stable. Noted a 6# (4.5%) wt gain x 1 month, which is positive, given pt's underweight status.  Unable to perform nutrition focuses physical exam due to somnolence.  Unable to diagnose malnutrition at this time, however, pt is at high risk for malnutrition given increased nutritional needs for surgical healing, ETOH abuse, and underweight status.   Height: Ht Readings from Last 1 Encounters:  11/23/12 6\' 1"  (1.854 m)    Weight: Wt Readings from Last 1 Encounters:  11/23/12 138 lb (62.596 kg)    Ideal Body Weight: 190#  % Ideal Body Weight: 73%  Wt Readings from Last 10 Encounters:  11/23/12 138 lb (62.596 kg)  11/23/12 138 lb (62.596 kg)  11/15/12 138 lb (62.596 kg)  11/01/12 130 lb 12.8 oz (59.33 kg)  10/26/12 137 lb (62.143 kg)  10/20/12 132 lb (59.875 kg)  10/20/12 132 lb (59.875 kg)  10/13/12 132 lb 12.8 oz (60.238 kg)  10/12/12 136 lb (61.689 kg)  10/05/12 136 lb (61.689 kg)    Usual Body Weight: 138#  % Usual Body Weight: 100%  BMI:  Body mass index is 18.21 kg/(m^2). Meets criteria for underweight  Estimated Nutritional Needs: Kcal: 1400-1500 daily Protein: 50-63 grams daily Fluid: 1.4-1.5 L daily  Skin: WDL  Diet Order: NPO  EDUCATION  NEEDS: -Education not appropriate at this time   Intake/Output Summary (Last 24 hours) at 11/24/12 1058 Last data filed at 11/24/12 0600  Gross per 24 hour  Intake    420 ml  Output    650 ml  Net   -230 ml    Last BM: 11/23/12  Labs:   Recent Labs Lab 11/23/12 2102 11/24/12 0512  NA 136 135  K 3.1* 4.1  CL 99 103  CO2 25 21  BUN 4* 4*  CREATININE 0.58 0.47*  CALCIUM 8.7 7.9*  GLUCOSE 88 104*    CBG (last 3)  No results found for this basename: GLUCAP,  in the last 72 hours  Scheduled Meds: . amLODipine  10 mg Oral Daily  . enoxaparin (LOVENOX) injection  40 mg Subcutaneous Q24H  . folic acid  1 mg Oral Daily  . multivitamin with minerals  1 tablet Oral Daily  . pantoprazole  40 mg Oral Daily  . sertraline  100 mg Oral BID  . thiamine  100 mg Oral Daily   Or  . thiamine  100 mg Intravenous Daily    Continuous Infusions: . sodium chloride 75 mL/hr at 11/24/12 0600    Past Medical History  Diagnosis Date  . Depression   . Elevated PSA   . Arthritis   . Hyperlipidemia   . Cataract   . Anxiety   . Bipolar 1 disorder   . ETOH abuse     quit Oct 2013, then relapsed, as  of Dec 2013 now has abstained X 1 month  . COPD (chronic obstructive pulmonary disease)   . Asthma   . Allergy   . Hypertension   . GERD (gastroesophageal reflux disease)     Past Surgical History  Procedure Laterality Date  . Colonoscopy  12/21/09    ZOX:WRUE papilla otherwise normal/ pancolonic diverticula/mutiple colonic poylps  . Spine surgery    . Back surgery      lunbar  . Colonoscopy with esophagogastroduodenoscopy (egd) N/A 10/20/2012    Procedure: COLONOSCOPY WITH ESOPHAGOGASTRODUODENOSCOPY (EGD);  Surgeon: Corbin Ade, MD;  Location: [facility];  Service: Endoscopy;  Laterality: N/A;  10;15    Hooria Gasparini A. Mayford Knife, RD, LDN Pager: [phone]

## 2012-11-24 NOTE — Addendum Note (Signed)
Addendum created 11/24/12 1415 by Moshe Salisbury, CRNA   Modules edited: Anesthesia Medication Administration

## 2012-11-24 NOTE — Anesthesia Preprocedure Evaluation (Signed)
Anesthesia Evaluation  Patient identified by MRN, date of birth, ID band Patient awake    Reviewed: Allergy & Precautions, H&P , NPO status , Patient's Chart, lab work & pertinent test results  Airway Mallampati: I TM Distance: >3 FB     Dental  (+) Edentulous Upper and Edentulous Lower   Pulmonary asthma , COPD breath sounds clear to auscultation        Cardiovascular hypertension, Pt. on medications Rhythm:Regular Rate:Tachycardia     Neuro/Psych  Headaches, PSYCHIATRIC DISORDERS Anxiety Depression    GI/Hepatic GERD-  Medicated and Controlled,(+)     substance abuse  alcohol use,   Endo/Other    Renal/GU      Musculoskeletal   Abdominal   Peds  Hematology  (+) Blood dyscrasia (thrombocytopenia), ,   Anesthesia Other Findings   Reproductive/Obstetrics                           Anesthesia Physical Anesthesia Plan  ASA: III  Anesthesia Plan: Spinal   Post-op Pain Management:    Induction:   Airway Management Planned: Nasal Cannula  Additional Equipment:   Intra-op Plan:   Post-operative Plan:   Informed Consent: I have reviewed the patients History and Physical, chart, labs and discussed the procedure including the risks, benefits and alternatives for the proposed anesthesia with the patient or authorized representative who has indicated his/her understanding and acceptance.     Plan Discussed with:   Anesthesia Plan Comments:         Anesthesia Quick Evaluation

## 2012-11-24 NOTE — Clinical Social Work Psychosocial (Addendum)
Clinical Social Work Department BRIEF PSYCHOSOCIAL ASSESSMENT 11/24/2012  Patient:  Duane Richardson, Duane Richardson     Account Number:  [id number]     Admit date:  11/23/2012  Clinical Social Worker:  Nancie Neas  Date/Time:  11/24/2012 10:15 AM  Referred by:  Physician  Date Referred:  11/24/2012 Referred for  SNF Placement   Other Referral:   Interview type:  Patient Other interview type:    PSYCHOSOCIAL DATA Living Status:  ALONE Admitted from facility:   Level of care:   Primary support name:  Duane Richardson Primary support relationship to patient:  SIBLING Degree of support available:   supportive per pt    CURRENT CONCERNS Current Concerns  Post-Acute Placement  Substance Abuse   Other Concerns:    SOCIAL WORK ASSESSMENT / PLAN CSW met with pt at bedside. Pt alert and oriented, complaining of pain in hip. RN aware and gave pain medication. He reports he lives alone most of the time, but his brother, Duane Richardson stays with him some. Pt describes Duane Richardson as his best support. Pt fell yesterday, fracturing his hip. Duane Richardson was there and when pt was unable to get up, he called EMS. Pt is on disability which he said is due to Bipolar disorder. He reports he was diagnosed about 10 years ago. His PCP is Dr. Tanya Nones in [facility]. Pt indicates he prescribes his Zoloft. He is not being seen by anyone else for therapy or psychiatry. Pt feels his medications are sufficient and is not interested in further treatment.    Pt referred for SNF placement. CSW discussed placement process and pt is agreeable to CSW initiating bed search. He is concerned about how long he would have to stay. CSW discussed that it would depend on how pt progressed after surgery. Surgery is scheduled for this afternoon. Pt aware that DSS would assign financial liability for SNF as he has Medicaid only. SNF list provided. Pt agreeable to bed search in [place], [place], and [place] counties.    CSW addressed ETOH use as pt's alcohol  level was 273 on admission. He indicates he used to drink daily but has been trying to cut back. Pt only drinks wine. He has been to [employer] in the past which he found helpful. He is willing to return to AA. CSW provided AA meeting schedule in addition to Rethinking Drinking booklet. CSW did not complete full ETOH assessment as pt was complaining of more pain. CSW will attempt later if pt is willing. He admits that drinking contributed to his fall.   Assessment/plan status:  Psychosocial Support/Ongoing Assessment of Needs Other assessment/ plan:   Information/referral to community resources:   SNF list  Rethinking Drinking  AA meeting schedule  [facility] Guide    PATIENT'S/FAMILY'S RESPONSE TO PLAN OF CARE: Pt admits to regular alcohol use and states he wants to cut back. Full assessment not completed as pt is in pain. CSW will complete later if appropriate. Will follow up with bed offers when available.      Derenda Fennel, [place] [phone]

## 2012-11-24 NOTE — Clinical Social Work Placement (Signed)
Clinical Social Work Department CLINICAL SOCIAL WORK PLACEMENT NOTE 11/24/2012  Patient:  Duane Richardson, Duane Richardson  Account Number:  [id number] Admit date:  11/23/2012  Clinical Social Worker:  Derenda Fennel, LCSW  Date/time:  11/24/2012 10:15 AM  Clinical Social Work is seeking post-discharge placement for this patient at the following level of care:   SKILLED NURSING   (*CSW will update this form in Epic as items are completed)   11/24/2012  Patient/family provided with [facility] Department of Clinical Social Work's list of facilities offering this level of care within the geographic area requested by the patient (or if unable, by the patient's family).  11/24/2012  Patient/family informed of their freedom to choose among providers that offer the needed level of care, that participate in Medicare, Medicaid or managed care program needed by the patient, have an available bed and are willing to accept the patient.  11/24/2012  Patient/family informed of [facility]' ownership interest in [facility], as well as of the fact that they are under no obligation to receive care at this facility.  PASARR submitted to EDS on 11/24/2012 PASARR number received from EDS on   FL2 transmitted to all facilities in geographic area requested by pt/family on  11/24/2012 FL2 transmitted to all facilities within larger geographic area on 11/24/2012  Patient informed that his/her managed care company has contracts with or will negotiate with  certain facilities, including the following:     Patient/family informed of bed offers received:   Patient chooses bed at  Physician recommends and patient chooses bed at    Patient to be transferred to  on   Patient to be transferred to facility by   The following physician request were entered in Epic:   Additional Comments: Medicaid only.  Derenda Fennel, [place] [phone]

## 2012-11-24 NOTE — Transfer of Care (Signed)
Immediate Anesthesia Transfer of Care Note  Patient: Duane Richardson  Procedure(s) Performed: Procedure(s): OPEN REDUCTION INTERNAL FIXATION HIP (Left)  Patient Location: PACU  Anesthesia Type:Spinal  Level of Consciousness: awake  Airway & Oxygen Therapy: Patient Spontanous Breathing and Patient connected to face mask oxygen  Post-op Assessment: Report given to PACU RN  Post vital signs: Reviewed and stable  Complications: No apparent anesthesia complications

## 2012-11-25 LAB — URINALYSIS, ROUTINE W REFLEX MICROSCOPIC
Bilirubin Urine: NEGATIVE
Glucose, UA: 250 mg/dL — AB
Nitrite: NEGATIVE
Protein, ur: NEGATIVE mg/dL
Specific Gravity, Urine: 1.02 (ref 1.005–1.030)
Urobilinogen, UA: 4 mg/dL — ABNORMAL HIGH (ref 0.0–1.0)
pH: 7.5 (ref 5.0–8.0)

## 2012-11-25 LAB — URINE MICROSCOPIC-ADD ON

## 2012-11-25 LAB — BASIC METABOLIC PANEL
BUN: 4 mg/dL — ABNORMAL LOW (ref 6–23)
CO2: 25 mEq/L (ref 19–32)
Calcium: 8.1 mg/dL — ABNORMAL LOW (ref 8.4–10.5)
Chloride: 97 mEq/L (ref 96–112)
Creatinine, Ser: 0.37 mg/dL — ABNORMAL LOW (ref 0.50–1.35)
GFR calc Af Amer: >90 mL/min (ref >90)
GFR calc non Af Amer: >90 mL/min (ref >90)
Glucose, Bld: 133 mg/dL — ABNORMAL HIGH (ref 70–99)
Potassium: 3.6 mEq/L (ref 3.5–5.1)
Sodium: 133 mEq/L — ABNORMAL LOW (ref 135–145)

## 2012-11-25 LAB — CBC WITH DIFFERENTIAL/PLATELET
Basophils Absolute: 0 10*3/uL (ref 0.0–0.1)
Basophils Relative: 0 % (ref 0–1)
Eosinophils Absolute: 0 10*3/uL (ref 0.0–0.7)
Eosinophils Relative: 0 % (ref 0–5)
HCT: 25.9 % — ABNORMAL LOW (ref 39.0–52.0)
Hemoglobin: 8.8 g/dL — ABNORMAL LOW (ref 13.0–17.0)
Lymphocytes Relative: 12 % (ref 12–46)
Lymphs Abs: 0.7 10*3/uL (ref 0.7–4.0)
MCH: 33.7 pg (ref 26.0–34.0)
MCHC: 34 g/dL (ref 30.0–36.0)
MCV: 99.2 fL (ref 78.0–100.0)
Monocytes Absolute: 0.6 10*3/uL (ref 0.1–1.0)
Monocytes Relative: 12 % (ref 3–12)
Neutro Abs: 4.1 10*3/uL (ref 1.7–7.7)
Neutrophils Relative %: 76 % (ref 43–77)
Platelets: 145 10*3/uL — ABNORMAL LOW (ref 150–400)
RBC: 2.61 MIL/uL — ABNORMAL LOW (ref 4.22–5.81)
RDW: 14.6 % (ref 11.5–15.5)
WBC: 5.4 10*3/uL (ref 4.0–10.5)

## 2012-11-25 LAB — PREPARE PLATELET PHERESIS: Unit division: 0

## 2012-11-25 MED ORDER — LORAZEPAM 2 MG/ML IJ SOLN
1.0000 mg | Freq: Once | INTRAMUSCULAR | Status: AC
  Administered 2012-11-25: 1 mg via INTRAVENOUS

## 2012-11-25 MED ORDER — ALBUTEROL SULFATE (5 MG/ML) 0.5% IN NEBU: 2.5000 mg | INHALATION_SOLUTION | Freq: Four times a day (QID) | RESPIRATORY_TRACT | Status: DC | PRN

## 2012-11-25 MED ORDER — ONDANSETRON HCL 4 MG/2ML IJ SOLN: 4.0000 mg | Freq: Four times a day (QID) | INTRAMUSCULAR | Status: DC | PRN

## 2012-11-25 MED ORDER — KCL IN DEXTROSE-NACL 20-5-0.9 MEQ/L-%-% IV SOLN
INTRAVENOUS | Status: DC
  Administered 2012-11-25: 11:00:00 via INTRAVENOUS
  Administered 2012-11-26: 1000 mL via INTRAVENOUS
  Administered 2012-11-27 – 2012-11-28 (×2): via INTRAVENOUS

## 2012-11-25 NOTE — Op Note (Signed)
NAMEJARIEL, DROST NO.:  [id number]  MEDICAL RECORD NO.:  [id number]  LOCATION:  A314                          FACILITY:  [facility]  PHYSICIAN:  J. Darreld Mclean, M.D. DATE OF BIRTH:  Nov 24, 1955  DATE OF PROCEDURE: DATE OF DISCHARGE:                              OPERATIVE REPORT   PREOPERATIVE DIAGNOSIS:  Intertrochanteric fracture of the left hip.  POSTOPERATIVE DIAGNOSIS:  Intertrochanteric fracture of the left hip.  PROCEDURE:  Open treatment and internal reduction of left hip intertrochanteric fracture using a [employer] hip compression screw system with 135-degree short-barrel side plate, and 161 mm long compression screw.  Compression applied to the system.  ANESTHESIA:  Spinal.  SURGEON:  J. Darreld Mclean, M.D.  ASSISTANT:  Cecile Sheerer, RN.  DRAIN:  One large Hemovac drain.  The patient fell yesterday and had this above-mentioned injury, has a history of alcohol abuse and an elevated blood alcohol yesterday.  He has other medical problems.  He was admitted by the hospitalist.  He is at increased risk of sudden medical problem and his alcohol abuse.  He is also a smoker.  Risks and imponderables were explained to the patient and his cousin last night.  They appeared to understand and agreed to proceed as outlined.  DESCRIPTION OF PROCEDURE:  The patient was seen in the holding area. The left hip was identified as correct surgical site.  A mark was placed on the left hip.  The patient was brought to the operating room, given spinal anesthesia.  Then placed supine on the operating room fracture table.  He was put into traction and the C-arm fluoroscopy unit was brought in place.  Had a time-out making sure everyone had on lead apron, lead shield, and x-ray badges.  X-rays looked good in AP and lateral views.  The patient was prepped and draped in the usual manner.  We had generalized time-out identifying the patient as Mr. Linsey.  We are  doing his left hip for intertrochanteric fracture.  All instrumentation properly working and properly positioned.  Everyone in the OR knew each other.  Incision was made through skin, subcutaneous tissue, tensor fascia lata vastus lateralis.  Femoral shaft was identified.  Guide pin was placed with good AP and lateral views.  It measured 100 mm.  100 mm step drill was used and then 100 mm compression screw was inserted.  A 135-degree short-barrel side plate was placed.  Drill holes were made for the compression and these measured from 44-40 mm.  Permanent x-rays were taken.  Hemovac drain was placed, sewn in with 2-0 silk suture.  Vastus lateralis reapproximated using running locking #1 Surgilon sutures. Tensor fascia lata reapproximated using #1 Surgilon suture. Subcutaneous tissue reapproximated using plain suture and skin staples applied.  Sterile dressing applied.  The patient tolerated the procedure well, will go to recovery in good condition.  The anesthesiologist has asked that I transfuse the patient platelets this afternoon.  I will do so.          ______________________________ J. Darreld Mclean, M.D.     JWK/MEDQ  D:  11/24/2012  T:  11/25/2012  Job:  [id number]

## 2012-11-25 NOTE — Progress Notes (Signed)
[facility] PROGRESS NOTE  Duane Richardson ZOX:[id number] DOB: [birth year] DOA: 11/23/2012 PCP: Leo Grosser, MD  Assessment/Plan: 57 -year-old male with history of alcohol abuse, smoker, hypertension, depression, COPD who came to the ED after patient fell 3 steps off his porch found to have L hip fracture  1. Left femur fracture POD #1 -cont pain control; PT/OT, need SNF;    2. EtOH abuse; at risk for withdrawals; patient had alcohol level of 273 on admission; Counseled against alcohol use  -cont alcohol withdrawal protocol  3. Hypokalemia Replace potassium; resolved   4. Hypertension Continue antihypertensive medications; amlodipine, hold hctz while on IVF;  prn hydralazine   5. Depression continue Zoloft   6. Pancytopenia  Chronic, secondary to alcohol abuse  7. Acute blood loss anemia, post op;  TF sing 2 units 10/2; recheck in AM    Code Status: full  Family Communication: none at the bedside (indicate person spoken with, relationship, and if by phone, the number) Disposition Plan: SNF    Consultants:  Orthopedics   Procedures:  Pend hip surgery   Antibiotics:  None  (indicate start date, and stop date if known)  HPI/Subjective: Alert, oriented   Objective: Filed Vitals:   11/25/12 0918  BP: 170/99  Pulse: 103  Temp: 98.6 F (37 C)  Resp: 20    Intake/Output Summary (Last 24 hours) at 11/25/12 0928 Last data filed at 11/25/12 0809  Gross per 24 hour  Intake   1860 ml  Output   1210 ml  Net    650 ml   Filed Weights   11/23/12 1912  Weight: 62.596 kg (138 lb)    Exam:   General:  Alert   Cardiovascular: s1,s2 rrr  Respiratory: cta BL   Abdomen: soft, nt nd   Musculoskeletal: NO le edema    Data Reviewed: Basic Metabolic Panel:  Recent Labs Lab 11/23/12 2102 11/24/12 0512 11/25/12 0510  NA 136 135 133*  K 3.1* 4.1 3.6  CL 99 103 97  CO2 25 21 25   GLUCOSE 88 104* 133*  BUN 4* 4* 4*  CREATININE 0.58 0.47* 0.37*   CALCIUM 8.7 7.9* 8.1*   Liver Function Tests:  Recent Labs Lab 11/23/12 2102  AST 85*  ALT 30  ALKPHOS 120*  BILITOT 0.5  PROT 7.2  ALBUMIN 3.1*   No results found for this basename: LIPASE, AMYLASE,  in the last 168 hours No results found for this basename: AMMONIA,  in the last 168 hours CBC:  Recent Labs Lab 11/23/12 2102 11/24/12 0512 11/25/12 0510  WBC 2.1* 4.7 5.4  NEUTROABS 0.4* 3.1 4.1  HGB 13.0 11.2* 8.8*  HCT 37.4* 32.9* 25.9*  MCV 98.4 99.1 99.2  PLT 94* 86* 145*   Cardiac Enzymes: No results found for this basename: CKTOTAL, CKMB, CKMBINDEX, TROPONINI,  in the last 168 hours BNP (last 3 results) No results found for this basename: PROBNP,  in the last 8760 hours CBG: No results found for this basename: GLUCAP,  in the last 168 hours  Recent Results (from the past 240 hour(s))  SURGICAL PCR SCREEN     Status: None   Collection Time    11/23/12 11:43 PM      Result Value Range Status   MRSA, PCR NEGATIVE  NEGATIVE Final   Staphylococcus aureus NEGATIVE  NEGATIVE Final   Comment:            The Xpert SA Assay (FDA     approved for NASAL  specimens     in patients over 66 years of age),     is one component of     a comprehensive surveillance     program.  Test performance has     been validated by [employer] for patients greater     than or equal to 17 year old.     It is not intended     to diagnose infection nor to     guide or monitor treatment.     Studies: Dg Chest 1 View  11/23/2012   CLINICAL DATA:  Fall with left hip fracture.  EXAM: CHEST - 1 VIEW  COMPARISON:  07/29/2012  FINDINGS: The heart size and mediastinal contours are within normal limits. Both lungs are clear. The visualized skeletal structures are unremarkable.  IMPRESSION: No active disease.   Electronically Signed   By: Irish Lack   On: 11/23/2012 21:05   Dg Hip Operative Left  11/24/2012   CLINICAL DATA:  Hip fracture  EXAM: OPERATIVE LEFT HIP  COMPARISON:   11/23/2012.  FINDINGS: C-arm films document open reduction internal fixation of a left hip fracture with compression screw and plate. Improved position and alignment.  IMPRESSION: As above.   Electronically Signed   By: Davonna Belling M.D.   On: 11/24/2012 16:11   Dg Femur Left  11/23/2012   CLINICAL DATA:  Fall with left hip pain.  EXAM: LEFT FEMUR - 2 VIEW  COMPARISON:  None.  FINDINGS: Acute intertrochanteric fracture of the proximal left femur shows mild displacement and avulsion of the lesser trochanter. There is some irregularity also along the superior aspect of the femoral neck and a component of a subcapital fracture cannot be excluded. This could be related to proliferative changes, however. Soft tissues are unremarkable.  IMPRESSION: Acute intertrochanteric fracture of the left hip. Possible component of additional subcapital fracture as well.   Electronically Signed   By: Irish Lack   On: 11/23/2012 21:04    Scheduled Meds: . amLODipine  10 mg Oral Daily  . enoxaparin (LOVENOX) injection  40 mg Subcutaneous Q24H  . folic acid  1 mg Oral Daily  . multivitamin with minerals  1 tablet Oral Daily  . pantoprazole  40 mg Oral Daily  . sertraline  100 mg Oral BID  . thiamine  100 mg Oral Daily   Or  . thiamine  100 mg Intravenous Daily   Continuous Infusions: . sodium chloride 1,000 mL (11/25/12 0544)    Active Problems:   DEPRESSION   ETOH abuse   Hip fracture   HTN (hypertension)    Time spent: > 35 minutes     Esperanza Sheets  [facility] Pager [phone]. If 7PM-7AM, please contact night-coverage at [email or web address], password [facility] 11/25/2012, 9:28 AM  LOS: 2 days

## 2012-11-25 NOTE — Progress Notes (Addendum)
Patient BP elevated, Dr. York Spaniel notified, requested Ativan to be given. Will continue to monitor.

## 2012-11-25 NOTE — Clinical Social Work Note (Signed)
CSW attempted to meet with pt to discuss SNF bed offer at [facility]. Pt was unable to wake up long enough for this. CSW will follow up tomorrow. 30 day pasarr received.   Derenda Fennel, [place] [phone]

## 2012-11-25 NOTE — Progress Notes (Signed)
PT Cancellation Note  Patient Details Name: Duane Richardson MRN: [id number] DOB: 13-Aug-1955   Cancelled Treatment:    Reason Eval/Treat Not Completed: Patient's level of consciousness Pt is unable to arouse to any degree.  He is receiving blood.  We will try to initiate PT later today.  Myrlene Broker L 11/25/2012, 9:13 AM

## 2012-11-25 NOTE — Progress Notes (Signed)
Patient BP continues to be elevated and observed hallucinations, Dr. York Spaniel notified. Ordered one time dose of Ativan 1mg  IV.

## 2012-11-25 NOTE — Progress Notes (Signed)
Subjective: 1 Day Post-Op Procedure(s) (LRB): OPEN REDUCTION INTERNAL FIXATION HIP (Left) Patient reports pain as 5 on 0-10 scale.    Objective: Vital signs in last 24 hours: Temp:  [96.9 F (36.1 C)-98.6 F (37 C)] 98.6 F (37 C) (10/02 0523) Pulse Rate:  [59-100] 100 (10/02 0523) Resp:  [12-21] 20 (10/02 0523) BP: (124-168)/(65-103) 146/94 mmHg (10/02 0523) SpO2:  [95 %-100 %] 97 % (10/02 0523)  Intake/Output from previous day: 10/01 0701 - 10/02 0700 In: 1847.5 [P.O.:360; I.V.:1200; Blood:287.5] Out: 1210 [Urine:950; Drains:110; Blood:150] Intake/Output this shift:     Recent Labs  11/23/12 2102 11/24/12 0512 11/25/12 0510  HGB 13.0 11.2* 8.8*    Recent Labs  11/24/12 0512 11/25/12 0510  WBC 4.7 5.4  RBC 3.32* 2.61*  HCT 32.9* 25.9*  PLT 86* 145*    Recent Labs  11/24/12 0512 11/25/12 0510  NA 135 133*  K 4.1 3.6  CL 103 97  CO2 21 25  BUN 4* 4*  CREATININE 0.47* 0.37*  GLUCOSE 104* 133*  CALCIUM 7.9* 8.1*    Recent Labs  11/23/12 2102  INR 0.95    Neurologically intact Neurovascular intact Sensation intact distally Intact pulses distally Dorsiflexion/Plantar flexion intact  He got his platelets yesterday.  His HGB is 8.8 and I will transfuse today.  He will begin physical therapy.   Assessment/Plan: 1 Day Post-Op Procedure(s) (LRB): OPEN REDUCTION INTERNAL FIXATION HIP (Left) Up with therapy  Duane Richardson 11/25/2012, 7:09 AM

## 2012-11-25 NOTE — Anesthesia Postprocedure Evaluation (Signed)
  Anesthesia Post-op Note  Patient: Duane Richardson  Procedure(s) Performed: Procedure(s): OPEN REDUCTION INTERNAL FIXATION HIP (Left)  Patient Location: Nursing Unit  Anesthesia Type:Spinal  Level of Consciousness: awake  Airway and Oxygen Therapy: Patient Spontanous Breathing  Post-op Pain: mild  Post-op Assessment: Post-op Vital signs reviewed, Patient's Cardiovascular Status Stable, Respiratory Function Stable, Patent Airway and No signs of Nausea or vomiting  Post-op Vital Signs: Reviewed and stable  Complications: No apparent anesthesia complications

## 2012-11-25 NOTE — Progress Notes (Signed)
PT Cancellation Note  Patient Details Name: Marcellus Pulliam Aldous MRN: [id number] DOB: 1955-07-18   Cancelled Treatment:    Reason Eval/Treat Not Completed: Patient's level of consciousness Pt was minimally awake at the time of my 2nd visit.  An attempt was made to work with him, to no avail.  He was unable to speak coherently and could not tolerate any intervention of the LLE.  We will try again tomorrow.  Konrad Penta 11/25/2012, 12:39 PM

## 2012-11-26 LAB — CBC WITH DIFFERENTIAL/PLATELET
Basophils Absolute: 0 10*3/uL (ref 0.0–0.1)
Basophils Relative: 0 % (ref 0–1)
Eosinophils Absolute: 0 10*3/uL (ref 0.0–0.7)
Eosinophils Relative: 0 % (ref 0–5)
HCT: 31.3 % — ABNORMAL LOW (ref 39.0–52.0)
Hemoglobin: 11 g/dL — ABNORMAL LOW (ref 13.0–17.0)
Lymphocytes Relative: 8 % — ABNORMAL LOW (ref 12–46)
Lymphs Abs: 1 10*3/uL (ref 0.7–4.0)
MCH: 33.2 pg (ref 26.0–34.0)
MCHC: 35.1 g/dL (ref 30.0–36.0)
MCV: 94.6 fL (ref 78.0–100.0)
Monocytes Absolute: 1.6 10*3/uL — ABNORMAL HIGH (ref 0.1–1.0)
Monocytes Relative: 13 % — ABNORMAL HIGH (ref 3–12)
Neutro Abs: 9.2 10*3/uL — ABNORMAL HIGH (ref 1.7–7.7)
Neutrophils Relative %: 79 % — ABNORMAL HIGH (ref 43–77)
Platelets: 126 10*3/uL — ABNORMAL LOW (ref 150–400)
RBC: 3.31 MIL/uL — ABNORMAL LOW (ref 4.22–5.81)
RDW: 16.5 % — ABNORMAL HIGH (ref 11.5–15.5)
WBC: 11.7 10*3/uL — ABNORMAL HIGH (ref 4.0–10.5)

## 2012-11-26 LAB — URINE CULTURE
Colony Count: NO GROWTH
Culture: NO GROWTH

## 2012-11-26 LAB — TYPE AND SCREEN
ABO/RH(D): A POS
Antibody Screen: NEGATIVE
Unit division: 0
Unit division: 0

## 2012-11-26 LAB — BASIC METABOLIC PANEL
BUN: 4 mg/dL — ABNORMAL LOW (ref 6–23)
CO2: 23 mEq/L (ref 19–32)
Calcium: 8.5 mg/dL (ref 8.4–10.5)
Chloride: 97 mEq/L (ref 96–112)
Creatinine, Ser: 0.4 mg/dL — ABNORMAL LOW (ref 0.50–1.35)
GFR calc Af Amer: >90 mL/min (ref >90)
GFR calc non Af Amer: >90 mL/min (ref >90)
Glucose, Bld: 133 mg/dL — ABNORMAL HIGH (ref 70–99)
Potassium: 3.7 mEq/L (ref 3.5–5.1)
Sodium: 131 mEq/L — ABNORMAL LOW (ref 135–145)

## 2012-11-26 MED ORDER — LORAZEPAM 2 MG/ML IJ SOLN
1.0000 mg | Freq: Once | INTRAMUSCULAR | Status: AC
  Administered 2012-11-26: 1 mg via INTRAVENOUS
  Filled 2012-11-26: qty 1

## 2012-11-26 MED ORDER — LORAZEPAM 2 MG/ML IJ SOLN
INTRAMUSCULAR | Status: AC
  Filled 2012-11-26: qty 1

## 2012-11-26 MED ORDER — LORAZEPAM 2 MG/ML IJ SOLN
1.0000 mg | Freq: Once | INTRAMUSCULAR | Status: AC
  Administered 2012-11-26: 1 mg via INTRAVENOUS

## 2012-11-26 NOTE — Progress Notes (Signed)
Duane Richardson [id number]:[id number] DOB: [birth year] DOA: 11/23/2012 PCP: Lynnea Ferrier TOM, MD   Subjective: Patient is acutely confused and clearly delirious from alcohol withdrawal. He is postoperative day 2 from left hip surgery for fracture.           Physical Exam: Blood pressure 152/96, pulse 97, temperature 98.7 F (37.1 C), temperature source Oral, resp. rate 20, height 6\' 1"  (1.854 m), weight 62.596 kg (138 lb), SpO2 98.00%. He is delirious/confused. He is hemodynamically stable. Heart sounds are present and normal without murmurs. Lung fields are clear. Abdomen is soft and nontender. There does not appear to be any focal neurological signs.   Investigations:  Recent Results (from the past 240 hour(s))  SURGICAL PCR SCREEN     Status: None   Collection Time    11/23/12 11:43 PM      Result Value Range Status   MRSA, PCR NEGATIVE  NEGATIVE Final   Staphylococcus aureus NEGATIVE  NEGATIVE Final   Comment:            The Xpert SA Assay (FDA     approved for NASAL specimens     in patients over 59 years of age),     is one component of     a comprehensive surveillance     program.  Test performance has     been validated by [employer] for patients greater     than or equal to 59 year old.     It is not intended     to diagnose infection nor to     guide or monitor treatment.     Basic Metabolic Panel:  Recent Labs  57/84/69 0510 11/26/12 0502  NA 133* 131*  K 3.6 3.7  CL 97 97  CO2 25 23  GLUCOSE 133* 133*  BUN 4* 4*  CREATININE 0.37* 0.40*  CALCIUM 8.1* 8.5   Liver Function Tests:  Recent Labs  11/23/12 2102  AST 85*  ALT 30  ALKPHOS 120*  BILITOT 0.5  PROT 7.2  ALBUMIN 3.1*     CBC:  Recent Labs  11/25/12 0510 11/26/12 0502  WBC 5.4 11.7*  NEUTROABS 4.1 9.2*  HGB 8.8* 11.0*  HCT 25.9* 31.3*  MCV 99.2 94.6  PLT 145* 126*    Dg Hip Operative Left  11/24/2012   CLINICAL DATA:  Hip fracture  EXAM: OPERATIVE LEFT HIP   COMPARISON:  11/23/2012.  FINDINGS: C-arm films document open reduction internal fixation of a left hip fracture with compression screw and plate. Improved position and alignment.  IMPRESSION: As above.   Electronically Signed   By: Davonna Belling M.D.   On: 11/24/2012 16:11      Medications: I have reviewed the patient's current medications.  Impression: 1. Alcohol withdrawal. 2. History of alcoholism. 3. Left hip fracture, status post surgery 2 days ago. 4. Hypertension.     Plan: 1. Continue with alcohol withdrawal protocol. 2. Orthopedic management postoperatively as outlined by Dr. Hilda Lias.  Consultants:  Orthopedics, Dr. Hilda Lias.   Procedures:  None.   Antibiotics:  None.                   Code Status: Full code.  Family Communication: No family members at the bedside.   Disposition Plan: Skilled nursing facility for rehabilitation when he recovers from his alcohol withdrawal.  Time spent: 20 minutes.   LOS: 3 days   Wilson Singer Pager [phone]  11/26/2012, 9:49 AM

## 2012-11-26 NOTE — Progress Notes (Signed)
Despite the q6h prn Ativan, pt was still agitated, tachycardic, hypertensive, and had visibile tremors. Dr. Orvan Falconer was made aware and ordered 1mg  Ativan once. Will continue to monitor pt.

## 2012-11-26 NOTE — Progress Notes (Signed)
UR Chart Review Completed  

## 2012-11-26 NOTE — Progress Notes (Signed)
PT Cancellation Note  Patient Details Name: Duane Richardson MRN: [id number] DOB: 01/29/1956   Cancelled Treatment:    Reason Eval/Treat Not Completed: Medical issues which prohibited therapy Pt appears to be in full DTs.  Unless this changes dramatically, we will not be able to work with him today.  Myrlene Broker L 11/26/2012, 10:42 AM

## 2012-11-26 NOTE — Progress Notes (Signed)
Subjective: 2 Days Post-Op Procedure(s) (LRB): OPEN REDUCTION INTERNAL FIXATION HIP (Left) Patient reports pain as seems to be controlled but he has been very confused.    Objective: Vital signs in last 24 hours: Temp:  [98 F (36.7 C)-99.2 F (37.3 C)] 98.7 F (37.1 C) (10/03 0649) Pulse Rate:  [92-103] 97 (10/03 0649) Resp:  [18-20] 20 (10/03 0649) BP: (143-170)/(90-111) 152/96 mmHg (10/03 0649) SpO2:  [95 %-100 %] 98 % (10/03 0649)  Intake/Output from previous day: 10/02 0701 - 10/03 0700 In: 560 [I.V.:547.5; Blood:12.5] Out: 1250 [Urine:1250] Intake/Output this shift:     Recent Labs  11/23/12 2102 11/24/12 0512 11/25/12 0510 11/26/12 0502  HGB 13.0 11.2* 8.8* 11.0*    Recent Labs  11/25/12 0510 11/26/12 0502  WBC 5.4 11.7*  RBC 2.61* 3.31*  HCT 25.9* 31.3*  PLT 145* 126*    Recent Labs  11/25/12 0510 11/26/12 0502  NA 133* 131*  K 3.6 3.7  CL 97 97  CO2 25 23  BUN 4* 4*  CREATININE 0.37* 0.40*  GLUCOSE 133* 133*  CALCIUM 8.1* 8.5    Recent Labs  11/23/12 2102  INR 0.95    Neurovascular intact Sensation intact distally Intact pulses distally Dorsiflexion/Plantar flexion intact Incision: scant drainage No cellulitis present  Hemovac and foley removed.  Wound OK.  He has been agitated and confused.  He is lethargic today.  Most likely the effects of alcohol withdrawal.  He was unable to do physical therapy yesterday.  Hopefully he will be more alert later today to have physical therapy.  His HGB is back to 11.4 post transfusions yesterday.  Assessment/Plan: 2 Days Post-Op Procedure(s) (LRB): OPEN REDUCTION INTERNAL FIXATION HIP (Left) Up with therapy if able.  Sarha Bartelt 11/26/2012, 7:19 AM

## 2012-11-26 NOTE — Clinical Social Work Note (Signed)
CSW attempted to meet with pt, but he is going through alcohol withdrawal. CSW left voicemail for pt's sister, requesting return call to discuss bed offers. Pt has bed offer at [facility] and [facility]. CSW will follow up on Monday.   Derenda Fennel, [place] [phone]

## 2012-11-27 LAB — CBC WITH DIFFERENTIAL/PLATELET
Basophils Absolute: 0 10*3/uL (ref 0.0–0.1)
Basophils Relative: 0 % (ref 0–1)
Eosinophils Absolute: 0 10*3/uL (ref 0.0–0.7)
Eosinophils Relative: 0 % (ref 0–5)
HCT: 28.5 % — ABNORMAL LOW (ref 39.0–52.0)
Hemoglobin: 9.9 g/dL — ABNORMAL LOW (ref 13.0–17.0)
Lymphocytes Relative: 9 % — ABNORMAL LOW (ref 12–46)
Lymphs Abs: 1 10*3/uL (ref 0.7–4.0)
MCH: 33.1 pg (ref 26.0–34.0)
MCHC: 34.7 g/dL (ref 30.0–36.0)
MCV: 95.3 fL (ref 78.0–100.0)
Monocytes Absolute: 2.2 10*3/uL — ABNORMAL HIGH (ref 0.1–1.0)
Monocytes Relative: 19 % — ABNORMAL HIGH (ref 3–12)
Neutro Abs: 8.4 10*3/uL — ABNORMAL HIGH (ref 1.7–7.7)
Neutrophils Relative %: 72 % (ref 43–77)
Platelets: 126 10*3/uL — ABNORMAL LOW (ref 150–400)
RBC: 2.99 MIL/uL — ABNORMAL LOW (ref 4.22–5.81)
RDW: 16.2 % — ABNORMAL HIGH (ref 11.5–15.5)
WBC: 11.6 10*3/uL — ABNORMAL HIGH (ref 4.0–10.5)

## 2012-11-27 LAB — COMPREHENSIVE METABOLIC PANEL
ALT: 15 U/L (ref 0–53)
AST: 36 U/L (ref 0–37)
Albumin: 2.4 g/dL — ABNORMAL LOW (ref 3.5–5.2)
Alkaline Phosphatase: 87 U/L (ref 39–117)
BUN: 7 mg/dL (ref 6–23)
CO2: 24 mEq/L (ref 19–32)
Calcium: 8.6 mg/dL (ref 8.4–10.5)
Chloride: 99 mEq/L (ref 96–112)
Creatinine, Ser: 0.42 mg/dL — ABNORMAL LOW (ref 0.50–1.35)
GFR calc Af Amer: >90 mL/min (ref >90)
GFR calc non Af Amer: >90 mL/min (ref >90)
Glucose, Bld: 106 mg/dL — ABNORMAL HIGH (ref 70–99)
Potassium: 3.2 mEq/L — ABNORMAL LOW (ref 3.5–5.1)
Sodium: 132 mEq/L — ABNORMAL LOW (ref 135–145)
Total Bilirubin: 0.8 mg/dL (ref 0.3–1.2)
Total Protein: 6 g/dL (ref 6.0–8.3)

## 2012-11-27 MED ORDER — LORAZEPAM 2 MG/ML IJ SOLN: 1.0000 mg | Freq: Four times a day (QID) | INTRAMUSCULAR | Status: DC | PRN

## 2012-11-27 MED ORDER — LORAZEPAM 2 MG/ML IJ SOLN
1.0000 mg | INTRAMUSCULAR | Status: DC | PRN
  Administered 2012-11-27 (×2): 1 mg via INTRAVENOUS
  Filled 2012-11-27: qty 1

## 2012-11-27 MED ORDER — LORAZEPAM 1 MG PO TABS
1.0000 mg | ORAL_TABLET | Freq: Four times a day (QID) | ORAL | Status: DC | PRN
  Administered 2012-11-28 – 2012-11-30 (×3): 1 mg via ORAL
  Filled 2012-11-27 (×3): qty 1

## 2012-11-27 MED ORDER — OXYCODONE HCL 5 MG PO TABS
5.0000 mg | ORAL_TABLET | ORAL | Status: DC | PRN
  Administered 2012-11-27 – 2012-11-30 (×4): 5 mg via ORAL
  Filled 2012-11-27 (×4): qty 1

## 2012-11-27 MED ORDER — LORAZEPAM 1 MG PO TABS
1.0000 mg | ORAL_TABLET | Freq: Four times a day (QID) | ORAL | Status: DC | PRN
  Administered 2012-11-27: 1 mg via ORAL
  Filled 2012-11-27: qty 1

## 2012-11-27 MED ORDER — THIAMINE HCL 100 MG/ML IJ SOLN
Freq: Once | INTRAVENOUS | Status: AC
  Administered 2012-11-27: 09:00:00 via INTRAVENOUS
  Filled 2012-11-27: qty 1000

## 2012-11-27 NOTE — Progress Notes (Signed)
PT Cancellation Note  Patient Details Name: Zinedine Ellner Shambley MRN: [id number] DOB: 07/07/1955   Cancelled Treatment:     Unable to get pt awake to treat.   RUSSELL,CINDY 11/27/2012, 10:44 AM

## 2012-11-27 NOTE — Progress Notes (Signed)
Subjective: 3 Days Post-Op Procedure(s) (LRB): OPEN REDUCTION INTERNAL FIXATION HIP (Left) Patient reports pain as Too confused to access pain level..    Objective: Vital signs in last 24 hours: Temp:  [97.9 F (36.6 C)-99.1 F (37.3 C)] 98.3 F (36.8 C) (10/04 0500) Pulse Rate:  [82-110] 96 (10/04 0500) Resp:  [19-20] 20 (10/04 0500) BP: (139-167)/(90-106) 148/94 mmHg (10/04 0500) SpO2:  [94 %-99 %] 97 % (10/04 0500)  Intake/Output from previous day: 10/03 0701 - 10/04 0700 In: 2723.8 [P.O.:50; I.V.:2673.8] Out: 500 [Urine:500] Intake/Output this shift:     Recent Labs  11/25/12 0510 11/26/12 0502 11/27/12 0620  HGB 8.8* 11.0* 9.9*    Recent Labs  11/26/12 0502 11/27/12 0620  WBC 11.7* 11.6*  RBC 3.31* 2.99*  HCT 31.3* 28.5*  PLT 126* 126*    Recent Labs  11/26/12 0502 11/27/12 0620  NA 131* 132*  K 3.7 3.2*  CL 97 99  CO2 23 24  BUN 4* 7  CREATININE 0.40* 0.42*  GLUCOSE 133* 106*  CALCIUM 8.5 8.6   No results found for this basename: LABPT, INR,  in the last 72 hours  Sensation intact distally Intact pulses distally Dorsiflexion/Plantar flexion intact Incision: no drainage No cellulitis present Compartment soft  He could not have physical therapy yesterday secondary to confusion.  He is confused today.  Wound ok.  HGB down to 9.9, platelets low.  Vitals stable.  Assessment/Plan: 3 Days Post-Op Procedure(s) (LRB): OPEN REDUCTION INTERNAL FIXATION HIP (Left) Up with therapy if possible.  Very confused.  Duane Richardson 11/27/2012, 9:45 AM

## 2012-11-27 NOTE — Progress Notes (Signed)
Duane Richardson UJW:[id number] DOB: 1955/04/21 DOA: 11/23/2012 PCP: Lynnea Ferrier TOM, MD   Subjective: Patient is acutely confused and clearly delirious from alcohol withdrawal. He is postoperative day 3 from left hip surgery for fracture. I note that he is receiving significant amounts of intravenous morphine. I think this may be adding to his confusion/drowsiness.           Physical Exam: Blood pressure 148/94, pulse 96, temperature 98.3 F (36.8 C), temperature source Oral, resp. rate 20, height 6\' 1"  (1.854 m), weight 62.596 kg (138 lb), SpO2 97.00%. He is delirious/confused. He is hemodynamically stable. Heart sounds are present and normal without murmurs. Lung fields are clear. Abdomen is soft and nontender. There does not appear to be any focal neurological signs.   Investigations:  Recent Results (from the past 240 hour(s))  SURGICAL PCR SCREEN     Status: None   Collection Time    11/23/12 11:43 PM      Result Value Range Status   MRSA, PCR NEGATIVE  NEGATIVE Final   Staphylococcus aureus NEGATIVE  NEGATIVE Final   Comment:            The Xpert SA Assay (FDA     approved for NASAL specimens     in patients over 39 years of age),     is one component of     a comprehensive surveillance     program.  Test performance has     been validated by [employer] for patients greater     than or equal to 20 year old.     It is not intended     to diagnose infection nor to     guide or monitor treatment.  URINE CULTURE     Status: None   Collection Time    11/25/12  2:10 AM      Result Value Range Status   Specimen Description URINE, CLEAN CATCH   Final   Special Requests NONE   Final   Culture  Setup Time     Final   Value: 11/25/2012 02:35     Performed at [facility] Count     Final   Value: NO GROWTH     Performed at [facility]   Culture     Final   Value: NO GROWTH     Performed at [facility]   Report Status  11/26/2012 FINAL   Final     Basic Metabolic Panel:  Recent Labs  56/21/30 0502 11/27/12 0620  NA 131* 132*  K 3.7 3.2*  CL 97 99  CO2 23 24  GLUCOSE 133* 106*  BUN 4* 7  CREATININE 0.40* 0.42*  CALCIUM 8.5 8.6   Liver Function Tests:  Recent Labs  11/27/12 0620  AST 36  ALT 15  ALKPHOS 87  BILITOT 0.8  PROT 6.0  ALBUMIN 2.4*     CBC:  Recent Labs  11/26/12 0502 11/27/12 0620  WBC 11.7* 11.6*  NEUTROABS 9.2* 8.4*  HGB 11.0* 9.9*  HCT 31.3* 28.5*  MCV 94.6 95.3  PLT 126* 126*    No results found.    Medications: I have reviewed the patient's current medications.  Impression: 1. Alcohol withdrawal. 2. History of alcoholism. 3. Left hip fracture, status post surgery 2 days ago. 4. Hypertension.     Plan: 1. Continue with alcohol withdrawal protocol. 2. Orthopedic management postoperatively as outlined by Dr. Hilda Lias. 3. Discontinue  IV morphine. Use oral oxycodone for pain relief as required.  Consultants:  Orthopedics, Dr. Hilda Lias.   Procedures:  None.   Antibiotics:  None.                   Code Status: Full code.  Family Communication: No family members at the bedside.   Disposition Plan: Skilled nursing facility for rehabilitation when he recovers from his alcohol withdrawal.  Time spent: 20 minutes.   LOS: 4 days   Wilson Singer Pager [phone]  11/27/2012, 7:37 AM

## 2012-11-28 LAB — COMPREHENSIVE METABOLIC PANEL
ALT: 18 U/L (ref 0–53)
AST: 46 U/L — ABNORMAL HIGH (ref 0–37)
Albumin: 2.4 g/dL — ABNORMAL LOW (ref 3.5–5.2)
Alkaline Phosphatase: 88 U/L (ref 39–117)
BUN: 10 mg/dL (ref 6–23)
CO2: 23 mEq/L (ref 19–32)
Calcium: 8.8 mg/dL (ref 8.4–10.5)
Chloride: 97 mEq/L (ref 96–112)
Creatinine, Ser: 0.36 mg/dL — ABNORMAL LOW (ref 0.50–1.35)
GFR calc Af Amer: >90 mL/min (ref >90)
GFR calc non Af Amer: >90 mL/min (ref >90)
Glucose, Bld: 106 mg/dL — ABNORMAL HIGH (ref 70–99)
Potassium: 3 mEq/L — ABNORMAL LOW (ref 3.5–5.1)
Sodium: 133 mEq/L — ABNORMAL LOW (ref 135–145)
Total Bilirubin: 1 mg/dL (ref 0.3–1.2)
Total Protein: 6.3 g/dL (ref 6.0–8.3)

## 2012-11-28 LAB — CBC
HCT: 29.1 % — ABNORMAL LOW (ref 39.0–52.0)
Hemoglobin: 10.1 g/dL — ABNORMAL LOW (ref 13.0–17.0)
MCH: 33 pg (ref 26.0–34.0)
MCHC: 34.7 g/dL (ref 30.0–36.0)
MCV: 95.1 fL (ref 78.0–100.0)
Platelets: 162 10*3/uL (ref 150–400)
RBC: 3.06 MIL/uL — ABNORMAL LOW (ref 4.22–5.81)
RDW: 15.7 % — ABNORMAL HIGH (ref 11.5–15.5)
WBC: 10.3 10*3/uL (ref 4.0–10.5)

## 2012-11-28 MED ORDER — POTASSIUM CHLORIDE CRYS ER 20 MEQ PO TBCR
40.0000 meq | EXTENDED_RELEASE_TABLET | Freq: Once | ORAL | Status: AC
  Administered 2012-11-28: 40 meq via ORAL
  Filled 2012-11-28: qty 2

## 2012-11-28 NOTE — Progress Notes (Signed)
Duane Richardson WUJ:[id number] DOB: 1955-11-01 DOA: 11/23/2012 PCP: Leo Grosser, MD   Subjective: Patient is now less confused and is following commands quite appropriately. He is now postoperative day 4 from left hip surgery for fracture. Yesterday, his intravenous morphine was discontinued and I think this has helped to some degree.           Physical Exam: Blood pressure 135/86, pulse 99, temperature 97.8 F (36.6 C), temperature source Oral, resp. rate 20, height 6\' 1"  (1.854 m), weight 62.596 kg (138 lb), SpO2 100.00%. He is still drowsy but not as delirious/confused as he was yesterday. He is able to follow commands this morning with me. He is hemodynamically stable. Heart sounds are present and normal without murmurs. Lung fields are clear. Abdomen is soft and nontender. There does not appear to be any focal neurological signs.   Investigations:  Recent Results (from the past 240 hour(s))  SURGICAL PCR SCREEN     Status: None   Collection Time    11/23/12 11:43 PM      Result Value Range Status   MRSA, PCR NEGATIVE  NEGATIVE Final   Staphylococcus aureus NEGATIVE  NEGATIVE Final   Comment:            The Xpert SA Assay (FDA     approved for NASAL specimens     in patients over 50 years of age),     is one component of     a comprehensive surveillance     program.  Test performance has     been validated by [employer] for patients greater     than or equal to 68 year old.     It is not intended     to diagnose infection nor to     guide or monitor treatment.  URINE CULTURE     Status: None   Collection Time    11/25/12  2:10 AM      Result Value Range Status   Specimen Description URINE, CLEAN CATCH   Final   Special Requests NONE   Final   Culture  Setup Time     Final   Value: 11/25/2012 02:35     Performed at [facility] Count     Final   Value: NO GROWTH     Performed at [facility]   Culture     Final   Value: NO  GROWTH     Performed at [facility]   Report Status 11/26/2012 FINAL   Final     Basic Metabolic Panel:  Recent Labs  95/62/13 0620 11/28/12 0447  NA 132* 133*  K 3.2* 3.0*  CL 99 97  CO2 24 23  GLUCOSE 106* 106*  BUN 7 10  CREATININE 0.42* 0.36*  CALCIUM 8.6 8.8   Liver Function Tests:  Recent Labs  11/27/12 0620 11/28/12 0447  AST 36 46*  ALT 15 18  ALKPHOS 87 88  BILITOT 0.8 1.0  PROT 6.0 6.3  ALBUMIN 2.4* 2.4*     CBC:  Recent Labs  11/26/12 0502 11/27/12 0620 11/28/12 0447  WBC 11.7* 11.6* 10.3  NEUTROABS 9.2* 8.4*  --   HGB 11.0* 9.9* 10.1*  HCT 31.3* 28.5* 29.1*  MCV 94.6 95.3 95.1  PLT 126* 126* 162    No results found.    Medications: I have reviewed the patient's current medications.  Impression: 1. Alcohol withdrawal. Improving. 2. History of  alcoholism. 3. Left hip fracture, status post surgery 2 days ago. 4. Hypertension. 5. Hypokalemia.     Plan: 1. Replete potassium. 2. Decrease IV fluids and encourage oral intake. 3. Sit  in a chair if possible today. 4. Reduce Ativan to only oral when necessary.  Consultants:  Orthopedics, Dr. Hilda Lias.   Procedures:  None.   Antibiotics:  None.                   Code Status: Full code.  Family Communication: No family members at the bedside.   Disposition Plan: Skilled nursing facility for rehabilitation when he recovers from his alcohol withdrawal.  Time spent: 20 minutes.   LOS: 5 days   Wilson Singer Pager [phone]  11/28/2012, 7:42 AM

## 2012-11-28 NOTE — Progress Notes (Signed)
Subjective: 4 Days Post-Op Procedure(s) (LRB): OPEN REDUCTION INTERNAL FIXATION HIP (Left) Patient reports pain as 4 on 0-10 scale.    Objective: Vital signs in last 24 hours: Temp:  [97.8 F (36.6 C)-98.4 F (36.9 C)] 97.8 F (36.6 C) (10/05 0554) Pulse Rate:  [99-107] 99 (10/05 0554) Resp:  [20] 20 (10/05 0554) BP: (124-146)/(79-92) 135/86 mmHg (10/05 0554) SpO2:  [96 %-100 %] 100 % (10/05 0554)  Intake/Output from previous day: 10/04 0701 - 10/05 0700 In: 1242.9 [P.O.:80; I.V.:1162.9] Out: 1800 [Urine:1800] Intake/Output this shift:     Recent Labs  11/26/12 0502 11/27/12 0620 11/28/12 0447  HGB 11.0* 9.9* 10.1*    Recent Labs  11/27/12 0620 11/28/12 0447  WBC 11.6* 10.3  RBC 2.99* 3.06*  HCT 28.5* 29.1*  PLT 126* 162    Recent Labs  11/27/12 0620 11/28/12 0447  NA 132* 133*  K 3.2* 3.0*  CL 99 97  CO2 24 23  BUN 7 10  CREATININE 0.42* 0.36*  GLUCOSE 106* 106*  CALCIUM 8.6 8.8   No results found for this basename: LABPT, INR,  in the last 72 hours  Neurologically intact Neurovascular intact Sensation intact distally Intact pulses distally Dorsiflexion/Plantar flexion intact Incision: no drainage  He is alert and aware of where he is and who I am.  He is willing to try to get out of bed today.  Vitals are stable.  Labs are stable.    Assessment/Plan: 4 Days Post-Op Procedure(s) (LRB): OPEN REDUCTION INTERNAL FIXATION HIP (Left) Up with therapy  Macrina Lehnert 11/28/2012, 10:20 AM

## 2012-11-29 ENCOUNTER — Encounter (HOSPITAL_COMMUNITY): Payer: Self-pay | Admitting: Orthopaedic Surgery

## 2012-11-29 DIAGNOSIS — F10239 Alcohol dependence with withdrawal, unspecified: Secondary | ICD-10-CM

## 2012-11-29 LAB — COMPREHENSIVE METABOLIC PANEL
ALT: 20 U/L (ref 0–53)
AST: 42 U/L — ABNORMAL HIGH (ref 0–37)
Albumin: 2.3 g/dL — ABNORMAL LOW (ref 3.5–5.2)
Alkaline Phosphatase: 86 U/L (ref 39–117)
BUN: 11 mg/dL (ref 6–23)
CO2: 26 mEq/L (ref 19–32)
Calcium: 8.5 mg/dL (ref 8.4–10.5)
Chloride: 101 mEq/L (ref 96–112)
Creatinine, Ser: 0.41 mg/dL — ABNORMAL LOW (ref 0.50–1.35)
GFR calc Af Amer: >90 mL/min (ref >90)
GFR calc non Af Amer: >90 mL/min (ref >90)
Glucose, Bld: 105 mg/dL — ABNORMAL HIGH (ref 70–99)
Potassium: 3.3 mEq/L — ABNORMAL LOW (ref 3.5–5.1)
Sodium: 136 mEq/L (ref 135–145)
Total Bilirubin: 1 mg/dL (ref 0.3–1.2)
Total Protein: 6.1 g/dL (ref 6.0–8.3)

## 2012-11-29 MED ORDER — POTASSIUM CHLORIDE CRYS ER 20 MEQ PO TBCR
40.0000 meq | EXTENDED_RELEASE_TABLET | Freq: Once | ORAL | Status: AC
  Administered 2012-11-29: 40 meq via ORAL
  Filled 2012-11-29: qty 2

## 2012-11-29 NOTE — Evaluation (Signed)
Physical Therapy Evaluation Patient Details Name: Duane Richardson MRN: [id number] DOB: Nov 05, 1955 Today's Date: 11/29/2012 Time: 0820-0904 PT Time Calculation (min): 44 min  PT Assessment / Plan / Recommendation History of Present Illness   Pt is a 57 yo male who lives alone.  Pt has fx his Lt hip and now is in need of skilled PT to improve his mobility and safety of ambulation  Clinical Impression  Pt was able to participate in therapy today but became dizzy when sitting so was unable to transfer into the chair will continue to see and progress.    PT Assessment  Patient needs continued PT services    Follow Up Recommendations  SNF    Does the patient have the potential to tolerate intense rehabilitation    no  Barriers to Discharge  none      Equipment Recommendations  None recommended by PT    Recommendations for Other Services OT consult   Frequency Min 6X/week    Precautions / Restrictions Precautions Precautions: Fall Restrictions Weight Bearing Restrictions: Yes LLE Weight Bearing: Touchdown weight bearing   Pertinent Vitals/Pain 6/10      Mobility  Bed Mobility Bed Mobility: Supine to Sit;Sit to Supine Supine to Sit: 2: Max assist Sit to Supine: 2: Max assist Transfers Transfers: Not assessed    Exercises General Exercises - Lower Extremity Ankle Circles/Pumps: AAROM;Both;10 reps Heel Slides: AAROM;Both;10 reps Hip ABduction/ADduction: AAROM;Both;10 reps   PT Diagnosis: Difficulty walking;Generalized weakness  PT Problem List: Decreased strength;Decreased range of motion;Decreased activity tolerance;Decreased balance;Decreased mobility;Pain PT Treatment Interventions: DME instruction;Gait training;Therapeutic activities;Therapeutic exercise;Balance training     PT Goals(Current goals can be found in the care plan section)    Visit Information  Last PT Received On: 11/29/12       Prior Functioning  Home Living Family/patient expects to be  discharged to:: Private residence Living Arrangements: Alone Type of Home: House Home Access: Level entry Home Layout: One level Home Equipment: None Prior Function Level of Independence: Independent Communication Communication: No difficulties    Cognition  Cognition Arousal/Alertness: Awake/alert Behavior During Therapy: WFL for tasks assessed/performed Overall Cognitive Status: Within Functional Limits for tasks assessed    Extremity/Trunk Assessment Lower Extremity Assessment Lower Extremity Assessment: LLE deficits/detail;RLE deficits/detail RLE Deficits / Details: general weakness  2/5 LLE: Unable to fully assess due to pain   Balance    End of Session PT - End of Session Activity Tolerance: Patient limited by pain;Patient limited by fatigue Patient left: in bed;with call bell/phone within reach Nurse Communication: Mobility status  GP     RUSSELL,CINDY 11/29/2012, 9:04 AM

## 2012-11-29 NOTE — Progress Notes (Signed)
[facility] PROGRESS NOTE  Duane Richardson ZOX:[id number] DOB: 08-24-1955 DOA: 11/23/2012 PCP: Leo Grosser, MD  Assessment/Plan: 57 -year-old male with history of alcohol abuse, smoker, hypertension, depression, COPD who came to the ED after patient fell 3 steps off his porch found to have L hip fracture  1. Left femur fracture stable; SNF;  Likely in AM  2. EtOH abuse, withdrawal; Counseled against alcohol use  -clinically improving; mild confusion better today; cont alcohol withdrawal protocol; PO ativan   3. Hypokalemia Replace potassium;   4. Hypertension Continue antihypertensive medications; amlodipine, hold hctz while on IVF;  prn hydralazine   5. Depression continue Zoloft   6. Pancytopenia  Chronic, secondary to alcohol abuse  7. Acute blood loss anemia, post op;  TF sing 2 units 10/2; stable;   Code Status: full  Family Communication: none at the bedside (indicate person spoken with, relationship, and if by phone, the number) Disposition Plan: SNF likely on 10/7   Consultants:  Orthopedics   Procedures:  Pend hip surgery   Antibiotics:  None  (indicate start date, and stop date if known)  HPI/Subjective: Alert, oriented   Objective: Filed Vitals:   11/29/12 1200  BP:   Pulse:   Temp:   Resp: 20    Intake/Output Summary (Last 24 hours) at 11/29/12 1241 Last data filed at 11/29/12 0440  Gross per 24 hour  Intake    470 ml  Output    900 ml  Net   -430 ml   Filed Weights   11/23/12 1912  Weight: 62.596 kg (138 lb)    Exam:   General:  Alert   Cardiovascular: s1,s2 rrr  Respiratory: cta BL   Abdomen: soft, nt nd   Musculoskeletal: NO le edema    Data Reviewed: Basic Metabolic Panel:  Recent Labs Lab 11/25/12 0510 11/26/12 0502 11/27/12 0620 11/28/12 0447 11/29/12 0508  NA 133* 131* 132* 133* 136  K 3.6 3.7 3.2* 3.0* 3.3*  CL 97 97 99 97 101  CO2 25 23 24 23 26   GLUCOSE 133* 133* 106* 106* 105*  BUN 4* 4* 7 10  11   CREATININE 0.37* 0.40* 0.42* 0.36* 0.41*  CALCIUM 8.1* 8.5 8.6 8.8 8.5   Liver Function Tests:  Recent Labs Lab 11/23/12 2102 11/27/12 0620 11/28/12 0447 11/29/12 0508  AST 85* 36 46* 42*  ALT 30 15 18 20   ALKPHOS 120* 87 88 86  BILITOT 0.5 0.8 1.0 1.0  PROT 7.2 6.0 6.3 6.1  ALBUMIN 3.1* 2.4* 2.4* 2.3*   No results found for this basename: LIPASE, AMYLASE,  in the last 168 hours No results found for this basename: AMMONIA,  in the last 168 hours CBC:  Recent Labs Lab 11/23/12 2102 11/24/12 0512 11/25/12 0510 11/26/12 0502 11/27/12 0620 11/28/12 0447  WBC 2.1* 4.7 5.4 11.7* 11.6* 10.3  NEUTROABS 0.4* 3.1 4.1 9.2* 8.4*  --   HGB 13.0 11.2* 8.8* 11.0* 9.9* 10.1*  HCT 37.4* 32.9* 25.9* 31.3* 28.5* 29.1*  MCV 98.4 99.1 99.2 94.6 95.3 95.1  PLT 94* 86* 145* 126* 126* 162   Cardiac Enzymes: No results found for this basename: CKTOTAL, CKMB, CKMBINDEX, TROPONINI,  in the last 168 hours BNP (last 3 results) No results found for this basename: PROBNP,  in the last 8760 hours CBG: No results found for this basename: GLUCAP,  in the last 168 hours  Recent Results (from the past 240 hour(s))  SURGICAL PCR SCREEN     Status: None  Collection Time    11/23/12 11:43 PM      Result Value Range Status   MRSA, PCR NEGATIVE  NEGATIVE Final   Staphylococcus aureus NEGATIVE  NEGATIVE Final   Comment:            The Xpert SA Assay (FDA     approved for NASAL specimens     in patients over 61 years of age),     is one component of     a comprehensive surveillance     program.  Test performance has     been validated by [employer] for patients greater     than or equal to 8 year old.     It is not intended     to diagnose infection nor to     guide or monitor treatment.  URINE CULTURE     Status: None   Collection Time    11/25/12  2:10 AM      Result Value Range Status   Specimen Description URINE, CLEAN CATCH   Final   Special Requests NONE   Final   Culture   Setup Time     Final   Value: 11/25/2012 02:35     Performed at [facility] Count     Final   Value: NO GROWTH     Performed at [facility]   Culture     Final   Value: NO GROWTH     Performed at [facility]   Report Status 11/26/2012 FINAL   Final     Studies: No results found.  Scheduled Meds: . amLODipine  10 mg Oral Daily  . enoxaparin (LOVENOX) injection  40 mg Subcutaneous Q24H  . folic acid  1 mg Oral Daily  . multivitamin with minerals  1 tablet Oral Daily  . pantoprazole  40 mg Oral Daily  . sertraline  100 mg Oral BID  . thiamine  100 mg Oral Daily   Or  . thiamine  100 mg Intravenous Daily   Continuous Infusions: . dextrose 5 % and 0.9 % NaCl with KCl 20 mEq/L 20 mL/hr at 11/28/12 0845    Active Problems:   DEPRESSION   ETOH abuse   Hip fracture   HTN (hypertension)    Time spent: > 35 minutes     Esperanza Sheets  [facility] Pager [phone]. If 7PM-7AM, please contact night-coverage at [email or web address], password [facility] 11/29/2012, 12:41 PM  LOS: 6 days

## 2012-11-29 NOTE — Clinical Social Work Note (Addendum)
CSW spoke with pt's brother Producer, television/film/video". Left voicemails for sister and stepmother first, with no return call yet. He said he has visited pt in hospital and reports he has been very confused over weekend. CSW discussed d/c plan of SNF with him. He accepts bed for pt at [facility]. Facility notified and aware of 30 day pasarr. [facility] request that CSW not do Medicaid prior approval and they will complete upon admission. Possible d/c tomorrow per MD.  Derenda Fennel, LCSW [phone]

## 2012-11-29 NOTE — Progress Notes (Signed)
Subjective: 5 Days Post-Op Procedure(s) (LRB): OPEN REDUCTION INTERNAL FIXATION HIP (Left) Patient reports pain as 3 on 0-10 scale.    Objective: Vital signs in last 24 hours: Temp:  [97.9 F (36.6 C)-100.2 F (37.9 C)] 99.7 F (37.6 C) (10/06 0439) Pulse Rate:  [91-95] 91 (10/06 0439) Resp:  [18-20] 18 (10/06 0439) BP: (125-158)/(78-85) 158/82 mmHg (10/06 0439) SpO2:  [96 %-98 %] 96 % (10/06 0439)  Intake/Output from previous day: 10/05 0701 - 10/06 0700 In: 470 [P.O.:470] Out: 900 [Urine:900] Intake/Output this shift: Total I/O In: 350 [P.O.:350] Out: 900 [Urine:900]   Recent Labs  11/27/12 0620 11/28/12 0447  HGB 9.9* 10.1*    Recent Labs  11/27/12 0620 11/28/12 0447  WBC 11.6* 10.3  RBC 2.99* 3.06*  HCT 28.5* 29.1*  PLT 126* 162    Recent Labs  11/28/12 0447 11/29/12 0508  NA 133* 136  K 3.0* 3.3*  CL 97 101  CO2 23 26  BUN 10 11  CREATININE 0.36* 0.41*  GLUCOSE 106* 105*  CALCIUM 8.8 8.5   No results found for this basename: LABPT, INR,  in the last 72 hours  Neurologically intact Neurovascular intact Sensation intact distally Intact pulses distally Dorsiflexion/Plantar flexion intact Incision: no drainage  Physical therapy to see today.  Hopefully he will do well now that he is alert.  Assessment/Plan: 5 Days Post-Op Procedure(s) (LRB): OPEN REDUCTION INTERNAL FIXATION HIP (Left) Up with therapy   Consider discharge to SNF.  He will need staples removed next Monday October 13 and Steri-strip wound.  He will need physical therapy and touch down on left with walker.  He will need to continue the enoxaparin for one month.  He will need appointment to my office in one month post discharge with x-rays of the left hip prior to appointment.  Harshini Trent 11/29/2012, 6:54 AM

## 2012-11-29 NOTE — Clinical Social Work Placement (Signed)
Clinical Social Work Department CLINICAL SOCIAL WORK PLACEMENT NOTE 11/29/2012  Patient:  Duane Richardson, Duane Richardson  Account Number:  [id number] Admit date:  11/23/2012  Clinical Social Worker:  Derenda Fennel, LCSW  Date/time:  11/24/2012 10:15 AM  Clinical Social Work is seeking post-discharge placement for this patient at the following level of care:   SKILLED NURSING   (*CSW will update this form in Epic as items are completed)   11/24/2012  Patient/family provided with [facility] Department of Clinical Social Work's list of facilities offering this level of care within the geographic area requested by the patient (or if unable, by the patient's family).  11/24/2012  Patient/family informed of their freedom to choose among providers that offer the needed level of care, that participate in Medicare, Medicaid or managed care program needed by the patient, have an available bed and are willing to accept the patient.  11/24/2012  Patient/family informed of [facility]' ownership interest in [facility], as well as of the fact that they are under no obligation to receive care at this facility.  PASARR submitted to EDS on 11/24/2012 PASARR number received from [employer] on 11/25/2012  FL2 transmitted to all facilities in geographic area requested by pt/family on  11/24/2012 FL2 transmitted to all facilities within larger geographic area on 11/24/2012  Patient informed that his/her managed care company has contracts with or will negotiate with  certain facilities, including the following:     Patient/family informed of bed offers received:  11/29/2012 Patient chooses bed at [facility] Physician recommends and patient chooses bed at  OTHER  Patient to be transferred to  on   Patient to be transferred to facility by   The following physician request were entered in Epic:   Additional Comments: Medicaid only. 30 day pasarr. [facility] aware.  Derenda Fennel,  [place] [phone]

## 2012-11-30 ENCOUNTER — Encounter (HOSPITAL_COMMUNITY): Payer: Self-pay | Admitting: *Deleted

## 2012-11-30 DIAGNOSIS — E43 Unspecified severe protein-calorie malnutrition: Secondary | ICD-10-CM | POA: Insufficient documentation

## 2012-11-30 LAB — BASIC METABOLIC PANEL
BUN: 11 mg/dL (ref 6–23)
CO2: 26 mEq/L (ref 19–32)
Calcium: 9 mg/dL (ref 8.4–10.5)
Chloride: 102 mEq/L (ref 96–112)
Creatinine, Ser: 0.39 mg/dL — ABNORMAL LOW (ref 0.50–1.35)
GFR calc Af Amer: >90 mL/min (ref >90)
GFR calc non Af Amer: >90 mL/min (ref >90)
Glucose, Bld: 78 mg/dL (ref 70–99)
Potassium: 3.7 mEq/L (ref 3.5–5.1)
Sodium: 137 mEq/L (ref 135–145)

## 2012-11-30 MED ORDER — DSS 100 MG PO CAPS: 100.0000 mg | ORAL_CAPSULE | Freq: Two times a day (BID) | ORAL | Status: DC

## 2012-11-30 MED ORDER — BISACODYL 10 MG RE SUPP: 10.0000 mg | Freq: Once | RECTAL | Status: DC

## 2012-11-30 MED ORDER — SENNA 8.6 MG PO TABS: 1.0000 | ORAL_TABLET | Freq: Two times a day (BID) | ORAL | Status: DC

## 2012-11-30 MED ORDER — ACETAMINOPHEN 325 MG PO TABS: 650.0000 mg | ORAL_TABLET | Freq: Four times a day (QID) | ORAL | Status: DC | PRN

## 2012-11-30 MED ORDER — DOCUSATE SODIUM 100 MG PO CAPS
100.0000 mg | ORAL_CAPSULE | Freq: Two times a day (BID) | ORAL | Status: DC
  Administered 2012-11-30 – 2012-12-01 (×3): 100 mg via ORAL
  Filled 2012-11-30 (×3): qty 1

## 2012-11-30 MED ORDER — SENNA 8.6 MG PO TABS
1.0000 | ORAL_TABLET | Freq: Two times a day (BID) | ORAL | Status: DC
  Administered 2012-11-30 – 2012-12-01 (×3): 8.6 mg via ORAL
  Filled 2012-11-30 (×3): qty 1

## 2012-11-30 MED ORDER — BISACODYL 10 MG RE SUPP: 10.0000 mg | Freq: Every day | RECTAL | Status: DC | PRN

## 2012-11-30 MED ORDER — BISACODYL 10 MG RE SUPP
10.0000 mg | Freq: Once | RECTAL | Status: AC
  Administered 2012-11-30: 10 mg via RECTAL
  Filled 2012-11-30: qty 1

## 2012-11-30 MED ORDER — ALBUTEROL SULFATE (5 MG/ML) 0.5% IN NEBU: 2.5000 mg | INHALATION_SOLUTION | Freq: Four times a day (QID) | RESPIRATORY_TRACT | Status: DC | PRN

## 2012-11-30 MED ORDER — ENOXAPARIN SODIUM 40 MG/0.4ML ~~LOC~~ SOLN: 40.0000 mg | SUBCUTANEOUS | Status: DC

## 2012-11-30 MED ORDER — ADULT MULTIVITAMIN W/MINERALS CH: 1.0000 | ORAL_TABLET | Freq: Every day | ORAL | Status: DC

## 2012-11-30 NOTE — Progress Notes (Signed)
Subjective: 6 Days Post-Op Procedure(s) (LRB): OPEN REDUCTION INTERNAL FIXATION HIP (Left) Patient reports pain as 3 on 0-10 scale.    Objective: Vital signs in last 24 hours: Temp:  [98.7 F (37.1 C)-99 F (37.2 C)] 98.7 F (37.1 C) (10/06 2327) Pulse Rate:  [86-96] 86 (10/06 2327) Resp:  [18-20] 18 (10/07 0400) BP: (124-150)/(86) 124/86 mmHg (10/06 2327) SpO2:  [96 %-98 %] 98 % (10/07 0400)  Intake/Output from previous day: 10/06 0701 - 10/07 0700 In: 902.5 [I.V.:902.5] Out: 500 [Urine:500] Intake/Output this shift:     Recent Labs  11/28/12 0447  HGB 10.1*    Recent Labs  11/28/12 0447  WBC 10.3  RBC 3.06*  HCT 29.1*  PLT 162    Recent Labs  11/29/12 0508 11/30/12 0443  NA 136 137  K 3.3* 3.7  CL 101 102  CO2 26 26  BUN 11 11  CREATININE 0.41* 0.39*  GLUCOSE 105* 78  CALCIUM 8.5 9.0   No results found for this basename: LABPT, INR,  in the last 72 hours  Neurologically intact Neurovascular intact Sensation intact distally Intact pulses distally Dorsiflexion/Plantar flexion intact Incision: no drainage No cellulitis present  He is alert.  He did fair in physical therapy yesterday.  He should be able to go to nursing facility when bed available.  Assessment/Plan: 6 Days Post-Op Procedure(s) (LRB): OPEN REDUCTION INTERNAL FIXATION HIP (Left) Discharge to SNF when bed available.  See my plans from yesterday as far as PT and appointment to my office.  Duane Richardson 11/30/2012, 7:22 AM

## 2012-11-30 NOTE — Clinical Social Work Note (Signed)
CSW notified by RN that family had not arrived to pick up pt. Family is talking about a caseworker that they are trying to get in touch with with the last name Yoke. Family would like for the caseworker to transport pt and complete paperwork. CSW left voicemail for DSS asking if there is any involvement with pt there. CSW went in room this afternoon to talk to pt about EMS transport and signing his own paperwork. Pt oriented to self only and believes he is at home. He said he does not remember discussion about rehab. Facility unable to accept pt until someone is willing to complete admission paperwork. Spoke to [employer] and Sam listed on chart who report they do not have transportation today. Left voicemails for sister and stepmother. Updated MD and RN that pt will not d/c today due to this. Kelly at [facility] is willing to come to hospital in AM if pt is oriented in order to sign paperwork and then pt can transfer after that. CSW will follow up in AM.  Derenda Fennel, LCSW [phone]

## 2012-11-30 NOTE — Progress Notes (Signed)
Patient had large bowel movement this afternnoon.

## 2012-11-30 NOTE — Progress Notes (Signed)
Pt has had no BM since 11/23/2012.  Pt's abdomen soft and bowel sounds present.  Dr. York Spaniel notified via text page.  Dr. York Spaniel called and stated that he would be ordering medications for constipation.

## 2012-11-30 NOTE — Clinical Social Work Note (Addendum)
Pt more oriented this morning. Asking appropriate questions regarding d/c. Pt's cousin in room with permission by pt. Pt to transfer to [facility] today. Pt and facility aware and agreeable. Pt will notify rest of family. D/C summary and FL2 faxed. Pt requesting to have family pick him up. He is not interested in wheelchair van. Pt aware that [facility] is non-smoking facility and is agreeable.   Derenda Fennel, [place] [phone]

## 2012-11-30 NOTE — Discharge Summary (Addendum)
Physician Discharge Summary  Duane Richardson WUJ:[id number] DOB: [birth year] DOA: 11/23/2012  PCP: Leo Grosser, MD  Admit date: 11/23/2012 Discharge date: 11/30/2012  Time spent: >35 minutes  Recommendations for Outpatient Follow-up:  Follow up with orthopedics as below: He will need staples removed next Monday October 13 and Steri-strip wound.  He will need physical therapy and touch down on left with walker.  He will need to continue the enoxaparin for one month.  He will need appointment to my office in one month post discharge with x-rays of the left hip prior to appointment.  Discharge Diagnoses:  Active Problems:   DEPRESSION   ETOH abuse   Hip fracture   HTN (hypertension)   Discharge Condition: stable   Diet recommendation: heart healthy   Filed Weights   11/23/12 1912  Weight: 62.596 kg (138 lb)    History of present illness:  57 -year-old male with history of alcohol abuse, smoker, hypertension, depression, COPD who came to the ED after patient fell 3 steps off his porch found to have L hip fracture    Hospital Course:  1. Left femur fracture stable; SNF; f/u with ortho as above  2. EtOH abuse, withdrawal; Counseled against alcohol use  -resolved; tachycardia, HTN resolved; lytes improved; mild confusion with dysarthria at baseline (narcotids on hold);  3. Hypokalemia resolved  4. Hypertension Continue antihypertensive medications; amlodipine, hold hctz  5. Depression continue Zoloft  6. Pancytopenia  Chronic, secondary to alcohol abuse  7. Acute blood loss anemia, post op; TF sing 2 units 10/2; stable;    Procedures:  L hip (i.e. Studies not automatically included, echos, thoracentesis, etc; not x-rays)  Consultations:  Orthopedics   Discharge Exam: Filed Vitals:   11/30/12 0400  BP:   Pulse:   Temp:   Resp: 18    General: alert  Cardiovascular: s1,s2 rrr Respiratory: cta BL   Discharge Instructions  Discharge Orders   Future  Appointments Provider Department Dept Phone   01/27/2013 10:00 AM [facility] [phone]   Future Orders Complete By Expires   Diet - low sodium heart healthy  As directed    Discharge instructions  As directed    Comments:     Please follow up with orthopedics -need staples removed next Monday October 13 and Steri-strip wound. -need physical therapy and touch down on left with walker. -need to continue the enoxaparin for one month. -need appointment to my office in one month post discharge with x-rays of the left hip prior to appointment.   Increase activity slowly  As directed        Medication List    STOP taking these medications       hydrochlorothiazide 12.5 MG capsule  Commonly known as:  MICROZIDE      TAKE these medications       acetaminophen 325 MG tablet  Commonly known as:  TYLENOL  Take 2 tablets (650 mg total) by mouth every 6 (six) hours as needed for fever (prn temp greater than 101 F).     albuterol (5 MG/ML) 0.5% nebulizer solution  Commonly known as:  PROVENTIL  Take 0.5 mLs (2.5 mg total) by nebulization every 6 (six) hours as needed for wheezing or shortness of breath.     amLODipine 10 MG tablet  Commonly known as:  NORVASC  Take 1 tablet (10 mg total) by mouth daily.     bisacodyl 10 MG suppository  Commonly known as:  DULCOLAX  Place 1 suppository (10 mg total) rectally once.     bisacodyl 10 MG suppository  Commonly known as:  DULCOLAX  Place 1 suppository (10 mg total) rectally daily as needed.     DSS 100 MG Caps  Take 100 mg by mouth 2 (two) times daily.     enoxaparin 40 MG/0.4ML injection  Commonly known as:  LOVENOX  Inject 0.4 mLs (40 mg total) into the skin daily.     multivitamin with minerals Tabs tablet  Take 1 tablet by mouth daily.     omeprazole 20 MG capsule  Commonly known as:  PRILOSEC  Take 1 capsule (20 mg total) by mouth daily.     senna 8.6 MG Tabs tablet   Commonly known as:  SENOKOT  Take 1 tablet (8.6 mg total) by mouth 2 (two) times daily.     sertraline 100 MG tablet  Commonly known as:  ZOLOFT  Take 100 mg by mouth 2 (two) times daily.       No Known Allergies     Follow-up Information   Follow up with Darreld Mclean, MD In 4 weeks.   Specialty:  Orthopedic Surgery   Contact information:   [address] [place] [address] [phone]        The results of significant diagnostics from this hospitalization (including imaging, microbiology, ancillary and laboratory) are listed below for reference.    Significant Diagnostic Studies: Dg Chest 1 View  11/23/2012   CLINICAL DATA:  Fall with left hip fracture.  EXAM: CHEST - 1 VIEW  COMPARISON:  07/29/2012  FINDINGS: The heart size and mediastinal contours are within normal limits. Both lungs are clear. The visualized skeletal structures are unremarkable.  IMPRESSION: No active disease.   Electronically Signed   By: Irish Lack   On: 11/23/2012 21:05   Dg Hip Operative Left  11/24/2012   CLINICAL DATA:  Hip fracture  EXAM: OPERATIVE LEFT HIP  COMPARISON:  11/23/2012.  FINDINGS: C-arm films document open reduction internal fixation of a left hip fracture with compression screw and plate. Improved position and alignment.  IMPRESSION: As above.   Electronically Signed   By: Davonna Belling M.D.   On: 11/24/2012 16:11   Dg Femur Left  11/23/2012   CLINICAL DATA:  Fall with left hip pain.  EXAM: LEFT FEMUR - 2 VIEW  COMPARISON:  None.  FINDINGS: Acute intertrochanteric fracture of the proximal left femur shows mild displacement and avulsion of the lesser trochanter. There is some irregularity also along the superior aspect of the femoral neck and a component of a subcapital fracture cannot be excluded. This could be related to proliferative changes, however. Soft tissues are unremarkable.  IMPRESSION: Acute intertrochanteric fracture of the left hip. Possible component of  additional subcapital fracture as well.   Electronically Signed   By: Irish Lack   On: 11/23/2012 21:04   US Abdomen Complete  11/03/2012   *RADIOLOGY REPORT*  Clinical Data:  Pancytopenia, evaluate for hepatosplenomegaly  COMPLETE ABDOMINAL ULTRASOUND  Comparison:  CT abdomen pelvis dated 03/05/2012  Findings:  Gallbladder:  Underdistended.  No gallstones, gallbladder wall thickening, or pericholecystic fluid.  Negative sonographic Murphy's sign.  Common bile duct:  Measures 5 mm.  Liver:  No focal lesion identified.  Hyperechoic hepatic parenchyma, suggesting hepatic steatosis.  Measures 15.9 cm, within normal limits.  IVC:  Appears normal.  Pancreas:  Not well visualized due to overlying bowel gas.  Spleen:  Measures 4.5 cm, within normal  limits.  Right Kidney:  Measures 10.3 cm.  No mass or hydronephrosis.  Left Kidney:  Measures 9.8 cm.  No mass or hydronephrosis.  Abdominal aorta:  No aneurysm identified.  IMPRESSION: Liver and spleen are within normal limits for size.  Suspected hepatic steatosis.   Original Report Authenticated By: Charline Bills, M.D.    Microbiology: Recent Results (from the past 240 hour(s))  SURGICAL PCR SCREEN     Status: None   Collection Time    11/23/12 11:43 PM      Result Value Range Status   MRSA, PCR NEGATIVE  NEGATIVE Final   Staphylococcus aureus NEGATIVE  NEGATIVE Final   Comment:            The Xpert SA Assay (FDA     approved for NASAL specimens     in patients over 52 years of age),     is one component of     a comprehensive surveillance     program.  Test performance has     been validated by [employer] for patients greater     than or equal to 73 year old.     It is not intended     to diagnose infection nor to     guide or monitor treatment.  URINE CULTURE     Status: None   Collection Time    11/25/12  2:10 AM      Result Value Range Status   Specimen Description URINE, CLEAN CATCH   Final   Special Requests NONE   Final    Culture  Setup Time     Final   Value: 11/25/2012 02:35     Performed at [facility] Count     Final   Value: NO GROWTH     Performed at [facility]   Culture     Final   Value: NO GROWTH     Performed at [facility]   Report Status 11/26/2012 FINAL   Final     Labs: Basic Metabolic Panel:  Recent Labs Lab 11/26/12 0502 11/27/12 0620 11/28/12 0447 11/29/12 0508 11/30/12 0443  NA 131* 132* 133* 136 137  K 3.7 3.2* 3.0* 3.3* 3.7  CL 97 99 97 101 102  CO2 23 24 23 26 26   GLUCOSE 133* 106* 106* 105* 78  BUN 4* 7 10 11 11   CREATININE 0.40* 0.42* 0.36* 0.41* 0.39*  CALCIUM 8.5 8.6 8.8 8.5 9.0   Liver Function Tests:  Recent Labs Lab 11/23/12 2102 11/27/12 0620 11/28/12 0447 11/29/12 0508  AST 85* 36 46* 42*  ALT 30 15 18 20   ALKPHOS 120* 87 88 86  BILITOT 0.5 0.8 1.0 1.0  PROT 7.2 6.0 6.3 6.1  ALBUMIN 3.1* 2.4* 2.4* 2.3*   No results found for this basename: LIPASE, AMYLASE,  in the last 168 hours No results found for this basename: AMMONIA,  in the last 168 hours CBC:  Recent Labs Lab 11/23/12 2102 11/24/12 0512 11/25/12 0510 11/26/12 0502 11/27/12 0620 11/28/12 0447  WBC 2.1* 4.7 5.4 11.7* 11.6* 10.3  NEUTROABS 0.4* 3.1 4.1 9.2* 8.4*  --   HGB 13.0 11.2* 8.8* 11.0* 9.9* 10.1*  HCT 37.4* 32.9* 25.9* 31.3* 28.5* 29.1*  MCV 98.4 99.1 99.2 94.6 95.3 95.1  PLT 94* 86* 145* 126* 126* 162   Cardiac Enzymes: No results found for this basename: CKTOTAL, CKMB, CKMBINDEX, TROPONINI,  in the last 168 hours  BNP: BNP (last 3 results) No results found for this basename: PROBNP,  in the last 8760 hours CBG: No results found for this basename: GLUCAP,  in the last 168 hours     Signed:  Esperanza Sheets  [facility] 11/30/2012, 8:57 AM

## 2012-11-30 NOTE — Progress Notes (Addendum)
NUTRITION FOLLOW UP  Pt meets criteria for severe MALNUTRITION in the context of acute injury as evidenced by energy intake </=5-% for >/= 5 days and moderate-severe depletion subcutaneous fat (arms, orbital) and muscle wasting (clavicle, anterior thigh, calves) .  Intervention:   Magic Cup BID (290 kcal, 9 gr protein) Recommend pt continue with oral supplement after discharge BID daily due to his compromised nutrition status.  Nutrition Dx:   Malnutrition (severe) related to meal intake records, nutrition focused exam as evidenced by pt meal intake data </= 50% for >/= 5 days and severe   Goal:   Pt to meet >/= 90% of their estimated nutrition needs; not met  Monitor:   Po meals and supplements, weight status and labs  Assessment:   Pt has not had BM since 11/23/12 (laxative given 10/5).  Patient Active Problem List   Diagnosis Date Noted  . Hip fracture 11/23/2012  . HTN (hypertension) 11/23/2012  . Steatosis of liver 11/15/2012  . Alcohol intoxication 11/15/2012  . Anorexia nervosa 10/13/2012  . Thrombocytopenia, unspecified 10/13/2012  . Loss of weight 07/16/2012  . Loose stools 07/16/2012  . ETOH abuse   . Weight loss 02/17/2012  . Dysphagia 12/30/2011  . Oral candida 12/30/2011  . Rectal bleed 12/30/2011  . Unintentional weight loss 12/30/2011  . Hx of adenomatous colonic polyps 12/30/2011  . LEUKOPENIA, MILD 07/08/2007  . CHEST PAIN 06/24/2007  . COPD, MILD 12/21/2006  . LIVER FUNCTION TESTS, ABNORMAL 11/09/2006  . SYMPTOM, COUGH 09/28/2006  . HYPERLIPIDEMIA 05/27/2006  . DISORDER, BIPOLAR NOS 05/27/2006  . TOBACCO ABUSE 05/27/2006  . DEPRESSION 05/27/2006  . COMMON MIGRAINE 05/27/2006  . CATARACT NOS 05/27/2006  . ALLERGIC RHINITIS 05/27/2006  . ASTHMA 05/27/2006  . GERD 05/27/2006  . ARTHRITIS 05/27/2006  . LOW BACK PAIN, CHRONIC 05/27/2006  . MALAISE AND FATIGUE 05/27/2006   Nutrition Focused Physical Exam:  Subcutaneous Fat:  Orbital Region:  moderate malnturition Upper Arm Region: moderate malnutrition Thoracic and Lumbar Region: moderate   Muscle:  Temple Region: moderate malnutrition Clavicle Bone Region: severe  Clavicle and Acromion Bone Region: moderate-severe Scapular Bone Region: moderate malnutrition Dorsal Hand: WNL Patellar Region: severe malnturition Anterior Thigh Region: severe malnutrition Posterior Calf Region: severe malnutrition  Edema: none noted   Height: Ht Readings from Last 1 Encounters:  11/23/12 6\' 1"  (1.854 m)    Weight Status:   Wt Readings from Last 1 Encounters:  11/23/12 138 lb (62.596 kg)  no new wt since admission  Re-estimated needs:  Kcal: 1800-2205 Protein: 80-90 gr Fluid: 1900 ml/day  Skin: surgical incision left hip  Diet Order: Dysphagia 1 thin liquids-  Po intake very poor ( 0-25% most meals since admission x7 days)   Intake/Output Summary (Last 24 hours) at 11/30/12 1017 Last data filed at 11/29/12 1800  Gross per 24 hour  Intake  902.5 ml  Output    500 ml  Net  402.5 ml    Last BM: 11/23/12   Labs:   Recent Labs Lab 11/28/12 0447 11/29/12 0508 11/30/12 0443  NA 133* 136 137  K 3.0* 3.3* 3.7  CL 97 101 102  CO2 23 26 26   BUN 10 11 11   CREATININE 0.36* 0.41* 0.39*  CALCIUM 8.8 8.5 9.0  GLUCOSE 106* 105* 78    CBG (last 3)  No results found for this basename: GLUCAP,  in the last 72 hours  Scheduled Meds: . amLODipine  10 mg Oral Daily  . enoxaparin (LOVENOX) injection  40 mg Subcutaneous Q24H  . folic acid  1 mg Oral Daily  . multivitamin with minerals  1 tablet Oral Daily  . pantoprazole  40 mg Oral Daily  . sertraline  100 mg Oral BID  . thiamine  100 mg Oral Daily   Or  . thiamine  100 mg Intravenous Daily    Continuous Infusions: . dextrose 5 % and 0.9 % NaCl with KCl 20 mEq/L 20 mL/hr at 11/29/12 1800    Royann Shivers MS,RD,LDN,CSG Office: #[phone] Pager: [phone]

## 2012-11-30 NOTE — Progress Notes (Signed)
UR chart review completed.  

## 2012-11-30 NOTE — Clinical Social Work Note (Signed)
Clinical Social Work Department CLINICAL SOCIAL WORK PLACEMENT NOTE 11/30/2012  Patient:  Duane Richardson, Duane Richardson  Account Number:  [id number] Admit date:  11/23/2012  Clinical Social Worker:  Sherrlyn Hock  Date/time:  11/24/2012 10:15 AM  Clinical Social Work is seeking post-discharge placement for this patient at the following level of care:   SKILLED NURSING   (*CSW will update this form in Epic as items are completed)   11/24/2012  Patient/family provided with [facility] Department of Clinical Social Work's list of facilities offering this level of care within the geographic area requested by the patient (or if unable, by the patient's family).  11/24/2012  Patient/family informed of their freedom to choose among providers that offer the needed level of care, that participate in Medicare, Medicaid or managed care program needed by the patient, have an available bed and are willing to accept the patient.  11/24/2012  Patient/family informed of [facility]' ownership interest in [facility], as well as of the fact that they are under no obligation to receive care at this facility.  PASARR submitted to EDS on 11/24/2012 PASARR number received from EDS on 11/25/2012  FL2 transmitted to all facilities in geographic area requested by pt/family on  11/24/2012 FL2 transmitted to all facilities within larger geographic area on 11/24/2012  Patient informed that his/her managed care company has contracts with or will negotiate with  certain facilities, including the following:     Patient/family informed of bed offers received:  11/29/2012 Patient chooses bed at [facility] Physician recommends and patient chooses bed at  OTHER  Patient to be transferred to [facility] on  11/30/2012 Patient to be transferred to facility by family  The following physician request were entered in Epic:   Additional Comments: Medicaid only. Facility request to  do Medicaid prior approval themselves. 30 day pasarr. [facility] aware.   Derenda Fennel, [place] [phone]

## 2012-12-01 NOTE — Clinical Social Work Note (Signed)
Met with pt at bedside this morning. Pt can complete paperwork on his own today. His stepmother is also at bedside and is willing to assist if needed. Notified [facility]. Pt to transfer via [facility]. No changes to d/c summary per MD.  Derenda Fennel, LCSW [phone]

## 2012-12-01 NOTE — Progress Notes (Signed)
[facility] PROGRESS NOTE  Duane Richardson UJW:[id number] DOB: May 21, 1955 DOA: 11/23/2012 PCP: Leo Grosser, MD  Assessment/Plan: 1. Left femur fracture s/p operative repair.  Discharge to SNF for physical therapy.  Cleared for discharge by orthopedics. Discharge summary done yesterday with no changes today. 2. ETOH abuse/withdrawal.  Appears to have improved.  No significant signs of withdrawal at present 3. Hypertension, stable 4. Hypokalemia, replaced 5. Depression, continue zoloft 6. Acute blood loss anemia, transfused 2 unit prbc during this admission 7. Confusion.  Patient was confused yesterday. This appears to have resolved.  He is now alert and oriented.  This may have been related to alcohol withdrawal vs. meds.  Code Status: full code Family Communication: no family present Disposition Plan: discharge to SNF today   Consultants:  Orthopedics, Dr. Hilda Lias  Procedures:  Left hip repair  Antibiotics:  none  HPI/Subjective: Confusion appears to have resolved.  No new complaints  Objective: Filed Vitals:   12/01/12 0443  BP: 125/85  Pulse: 108  Temp: 98.7 F (37.1 C)  Resp: 20    Intake/Output Summary (Last 24 hours) at 12/01/12 0935 Last data filed at 12/01/12 0925  Gross per 24 hour  Intake    640 ml  Output    650 ml  Net    -10 ml   Filed Weights   11/23/12 1912  Weight: 62.596 kg (138 lb)    Exam:   General:  NAD,   Cardiovascular: S1, S2 RRR  Respiratory: CTA B  Abdomen: soft, nt, nd, bs+  Musculoskeletal: no pedal edema b/l   Data Reviewed: Basic Metabolic Panel:  Recent Labs Lab 11/26/12 0502 11/27/12 0620 11/28/12 0447 11/29/12 0508 11/30/12 0443  NA 131* 132* 133* 136 137  K 3.7 3.2* 3.0* 3.3* 3.7  CL 97 99 97 101 102  CO2 23 24 23 26 26   GLUCOSE 133* 106* 106* 105* 78  BUN 4* 7 10 11 11   CREATININE 0.40* 0.42* 0.36* 0.41* 0.39*  CALCIUM 8.5 8.6 8.8 8.5 9.0   Liver Function Tests:  Recent Labs Lab  11/27/12 0620 11/28/12 0447 11/29/12 0508  AST 36 46* 42*  ALT 15 18 20   ALKPHOS 87 88 86  BILITOT 0.8 1.0 1.0  PROT 6.0 6.3 6.1  ALBUMIN 2.4* 2.4* 2.3*   No results found for this basename: LIPASE, AMYLASE,  in the last 168 hours No results found for this basename: AMMONIA,  in the last 168 hours CBC:  Recent Labs Lab 11/25/12 0510 11/26/12 0502 11/27/12 0620 11/28/12 0447  WBC 5.4 11.7* 11.6* 10.3  NEUTROABS 4.1 9.2* 8.4*  --   HGB 8.8* 11.0* 9.9* 10.1*  HCT 25.9* 31.3* 28.5* 29.1*  MCV 99.2 94.6 95.3 95.1  PLT 145* 126* 126* 162   Cardiac Enzymes: No results found for this basename: CKTOTAL, CKMB, CKMBINDEX, TROPONINI,  in the last 168 hours BNP (last 3 results) No results found for this basename: PROBNP,  in the last 8760 hours CBG: No results found for this basename: GLUCAP,  in the last 168 hours  Recent Results (from the past 240 hour(s))  SURGICAL PCR SCREEN     Status: None   Collection Time    11/23/12 11:43 PM      Result Value Range Status   MRSA, PCR NEGATIVE  NEGATIVE Final   Staphylococcus aureus NEGATIVE  NEGATIVE Final   Comment:            The Xpert SA Assay (FDA  approved for NASAL specimens     in patients over 66 years of age),     is one component of     a comprehensive surveillance     program.  Test performance has     been validated by [employer] for patients greater     than or equal to 99 year old.     It is not intended     to diagnose infection nor to     guide or monitor treatment.  URINE CULTURE     Status: None   Collection Time    11/25/12  2:10 AM      Result Value Range Status   Specimen Description URINE, CLEAN CATCH   Final   Special Requests NONE   Final   Culture  Setup Time     Final   Value: 11/25/2012 02:35     Performed at [employer] [facility] Count     Final   Value: NO GROWTH     Performed at [employer] [facility]   Culture     Final   Value: NO GROWTH     Performed at [employer] [facility]   Report Status 11/26/2012 FINAL   Final     Studies: No results found.  Scheduled Meds: . amLODipine  10 mg Oral Daily  . docusate sodium  100 mg Oral BID  . enoxaparin (LOVENOX) injection  40 mg Subcutaneous Q24H  . folic acid  1 mg Oral Daily  . multivitamin with minerals  1 tablet Oral Daily  . pantoprazole  40 mg Oral Daily  . senna  1 tablet Oral BID  . sertraline  100 mg Oral BID  . thiamine  100 mg Oral Daily   Or  . thiamine  100 mg Intravenous Daily   Continuous Infusions: . dextrose 5 % and 0.9 % NaCl with KCl 20 mEq/L 20 mL/hr at 11/29/12 1800    Active Problems:   DEPRESSION   ETOH abuse   Hip fracture   HTN (hypertension)   Protein-calorie malnutrition, severe    Time spent:    [facility]  [facility] Pager [phone]. If 7PM-7AM, please contact night-coverage at [email or web address], password [facility] 12/01/2012, 9:35 AM  LOS: 8 days

## 2012-12-01 NOTE — Progress Notes (Signed)
Discharged to [facility], condition stable, out via Doctor, general practice by [employer].

## 2012-12-01 NOTE — Progress Notes (Signed)
Subjective: 7 Days Post-Op Procedure(s) (LRB): OPEN REDUCTION INTERNAL FIXATION HIP (Left) Patient reports pain as 2 on 0-10 scale.    Objective: Vital signs in last 24 hours: Temp:  [98.4 F (36.9 C)-99.6 F (37.6 C)] 98.7 F (37.1 C) (10/08 0443) Pulse Rate:  [89-108] 108 (10/08 0443) Resp:  [18-20] 20 (10/08 0443) BP: (114-125)/(55-85) 125/85 mmHg (10/08 0443) SpO2:  [96 %-100 %] 99 % (10/08 0443)  Intake/Output from previous day: 10/07 0701 - 10/08 0700 In: 400 [P.O.:400] Out: 650 [Urine:650] Intake/Output this shift:    No results found for this basename: HGB,  in the last 72 hours No results found for this basename: WBC, RBC, HCT, PLT,  in the last 72 hours  Recent Labs  11/29/12 0508 11/30/12 0443  NA 136 137  K 3.3* 3.7  CL 101 102  CO2 26 26  BUN 11 11  CREATININE 0.41* 0.39*  GLUCOSE 105* 78  CALCIUM 8.5 9.0   No results found for this basename: LABPT, INR,  in the last 72 hours  Neurologically intact Neurovascular intact Sensation intact distally Intact pulses distally Dorsiflexion/Plantar flexion intact Incision: no drainage No cellulitis present  Discharge could not be done yesterday to nursing home because of inability to have him properly sign papers or get any member of the family to sign for him.  He is able to go.  His mentation status comes and goes.  He is able to go medically but cannot until the so-called paperwork is completed.  Assessment/Plan: 7 Days Post-Op Procedure(s) (LRB): OPEN REDUCTION INTERNAL FIXATION HIP (Left) Discharge to SNF  [facility] 12/01/2012, 7:13 AM

## 2012-12-07 ENCOUNTER — Ambulatory Visit: Payer: Medicaid Other | Admitting: Urology

## 2013-01-26 ENCOUNTER — Telehealth: Payer: Self-pay | Admitting: Family Medicine

## 2013-01-26 NOTE — Telephone Encounter (Signed)
Home Health nurse called and stated that this pt was released from [facility] with Nebulizer solution from hospital with no nebulizer. Do you want to send in an order for machine or change him to an inhaler?????

## 2013-01-26 NOTE — Telephone Encounter (Signed)
This patient was homeless in the past. I see in the computer that he has been admitted with alcohol intoxication. I do not think he is going to do well using a nebulizer machine and mixing nebulizer solution etc.  Send RX for Ventolin HFA 2 puffs every 4-6 hours as needed. Dispense #1 with 2 additional refills.

## 2013-01-27 ENCOUNTER — Encounter: Payer: Self-pay | Admitting: Family Medicine

## 2013-01-27 ENCOUNTER — Ambulatory Visit: Payer: Medicaid Other | Admitting: Gastroenterology

## 2013-01-27 NOTE — Telephone Encounter (Signed)
[facility] and left a voicemail regarding pt and was wanting someone to call her back  Call back number is [phone]

## 2013-01-28 ENCOUNTER — Inpatient Hospital Stay (HOSPITAL_COMMUNITY)
Admission: EM | Admit: 2013-01-28 | Discharge: 2013-02-03 | DRG: 872 | Disposition: A | Discharge status: Home Health | Payer: Medicaid Other | Attending: Family Medicine | Admitting: Family Medicine

## 2013-01-28 ENCOUNTER — Emergency Department (HOSPITAL_COMMUNITY): Payer: Medicaid Other

## 2013-01-28 ENCOUNTER — Encounter (HOSPITAL_COMMUNITY): Payer: Self-pay | Admitting: Emergency Medicine

## 2013-01-28 DIAGNOSIS — F1010 Alcohol abuse, uncomplicated: Secondary | ICD-10-CM

## 2013-01-28 DIAGNOSIS — F329 Major depressive disorder, single episode, unspecified: Secondary | ICD-10-CM

## 2013-01-28 DIAGNOSIS — R059 Cough, unspecified: Secondary | ICD-10-CM

## 2013-01-28 DIAGNOSIS — G43009 Migraine without aura, not intractable, without status migrainosus: Secondary | ICD-10-CM

## 2013-01-28 DIAGNOSIS — H269 Unspecified cataract: Secondary | ICD-10-CM

## 2013-01-28 DIAGNOSIS — M545 Low back pain, unspecified: Secondary | ICD-10-CM

## 2013-01-28 DIAGNOSIS — D649 Anemia, unspecified: Secondary | ICD-10-CM | POA: Diagnosis present

## 2013-01-28 DIAGNOSIS — R7881 Bacteremia: Secondary | ICD-10-CM

## 2013-01-28 DIAGNOSIS — F319 Bipolar disorder, unspecified: Secondary | ICD-10-CM

## 2013-01-28 DIAGNOSIS — N138 Other obstructive and reflux uropathy: Secondary | ICD-10-CM | POA: Diagnosis present

## 2013-01-28 DIAGNOSIS — R945 Abnormal results of liver function studies: Secondary | ICD-10-CM

## 2013-01-28 DIAGNOSIS — J45909 Unspecified asthma, uncomplicated: Secondary | ICD-10-CM

## 2013-01-28 DIAGNOSIS — F5000 Anorexia nervosa, unspecified: Secondary | ICD-10-CM

## 2013-01-28 DIAGNOSIS — R1310 Dysphagia, unspecified: Secondary | ICD-10-CM

## 2013-01-28 DIAGNOSIS — F32A Depression, unspecified: Secondary | ICD-10-CM | POA: Diagnosis present

## 2013-01-28 DIAGNOSIS — M129 Arthropathy, unspecified: Secondary | ICD-10-CM

## 2013-01-28 DIAGNOSIS — R6510 Systemic inflammatory response syndrome (SIRS) of non-infectious origin without acute organ dysfunction: Secondary | ICD-10-CM

## 2013-01-28 DIAGNOSIS — F3289 Other specified depressive episodes: Secondary | ICD-10-CM

## 2013-01-28 DIAGNOSIS — N179 Acute kidney failure, unspecified: Secondary | ICD-10-CM

## 2013-01-28 DIAGNOSIS — F411 Generalized anxiety disorder: Secondary | ICD-10-CM | POA: Diagnosis present

## 2013-01-28 DIAGNOSIS — R5381 Other malaise: Secondary | ICD-10-CM

## 2013-01-28 DIAGNOSIS — A4159 Other Gram-negative sepsis: Principal | ICD-10-CM | POA: Diagnosis present

## 2013-01-28 DIAGNOSIS — R05 Cough: Secondary | ICD-10-CM

## 2013-01-28 DIAGNOSIS — F17200 Nicotine dependence, unspecified, uncomplicated: Secondary | ICD-10-CM

## 2013-01-28 DIAGNOSIS — N139 Obstructive and reflux uropathy, unspecified: Secondary | ICD-10-CM | POA: Diagnosis present

## 2013-01-28 DIAGNOSIS — R509 Fever, unspecified: Secondary | ICD-10-CM

## 2013-01-28 DIAGNOSIS — F10929 Alcohol use, unspecified with intoxication, unspecified: Secondary | ICD-10-CM

## 2013-01-28 DIAGNOSIS — D72819 Decreased white blood cell count, unspecified: Secondary | ICD-10-CM

## 2013-01-28 DIAGNOSIS — J4489 Other specified chronic obstructive pulmonary disease: Secondary | ICD-10-CM

## 2013-01-28 DIAGNOSIS — R338 Other retention of urine: Secondary | ICD-10-CM

## 2013-01-28 DIAGNOSIS — R634 Abnormal weight loss: Secondary | ICD-10-CM

## 2013-01-28 DIAGNOSIS — N401 Enlarged prostate with lower urinary tract symptoms: Secondary | ICD-10-CM | POA: Diagnosis present

## 2013-01-28 DIAGNOSIS — A419 Sepsis, unspecified organism: Secondary | ICD-10-CM

## 2013-01-28 DIAGNOSIS — Z860101 Personal history of adenomatous and serrated colon polyps: Secondary | ICD-10-CM

## 2013-01-28 DIAGNOSIS — R339 Retention of urine, unspecified: Secondary | ICD-10-CM | POA: Diagnosis present

## 2013-01-28 DIAGNOSIS — K219 Gastro-esophageal reflux disease without esophagitis: Secondary | ICD-10-CM

## 2013-01-28 DIAGNOSIS — Z23 Encounter for immunization: Secondary | ICD-10-CM

## 2013-01-28 DIAGNOSIS — E43 Unspecified severe protein-calorie malnutrition: Secondary | ICD-10-CM

## 2013-01-28 DIAGNOSIS — E785 Hyperlipidemia, unspecified: Secondary | ICD-10-CM

## 2013-01-28 DIAGNOSIS — N3289 Other specified disorders of bladder: Secondary | ICD-10-CM | POA: Diagnosis present

## 2013-01-28 DIAGNOSIS — N390 Urinary tract infection, site not specified: Secondary | ICD-10-CM

## 2013-01-28 DIAGNOSIS — I10 Essential (primary) hypertension: Secondary | ICD-10-CM

## 2013-01-28 DIAGNOSIS — J449 Chronic obstructive pulmonary disease, unspecified: Secondary | ICD-10-CM | POA: Diagnosis present

## 2013-01-28 DIAGNOSIS — R195 Other fecal abnormalities: Secondary | ICD-10-CM

## 2013-01-28 DIAGNOSIS — R079 Chest pain, unspecified: Secondary | ICD-10-CM

## 2013-01-28 DIAGNOSIS — B370 Candidal stomatitis: Secondary | ICD-10-CM

## 2013-01-28 DIAGNOSIS — K760 Fatty (change of) liver, not elsewhere classified: Secondary | ICD-10-CM

## 2013-01-28 DIAGNOSIS — K625 Hemorrhage of anus and rectum: Secondary | ICD-10-CM

## 2013-01-28 DIAGNOSIS — D696 Thrombocytopenia, unspecified: Secondary | ICD-10-CM

## 2013-01-28 DIAGNOSIS — J309 Allergic rhinitis, unspecified: Secondary | ICD-10-CM

## 2013-01-28 DIAGNOSIS — Z86010 Personal history of colonic polyps: Secondary | ICD-10-CM

## 2013-01-28 LAB — BASIC METABOLIC PANEL
BUN: 36 mg/dL — ABNORMAL HIGH (ref 6–23)
CO2: 26 mEq/L (ref 19–32)
Calcium: 10.1 mg/dL (ref 8.4–10.5)
Chloride: 92 mEq/L — ABNORMAL LOW (ref 96–112)
Creatinine, Ser: 1.81 mg/dL — ABNORMAL HIGH (ref 0.50–1.35)
GFR calc Af Amer: 45 mL/min — ABNORMAL LOW (ref >90)
GFR calc non Af Amer: 39 mL/min — ABNORMAL LOW (ref >90)
Glucose, Bld: 120 mg/dL — ABNORMAL HIGH (ref 70–99)
Potassium: 4.3 mEq/L (ref 3.5–5.1)
Sodium: 130 mEq/L — ABNORMAL LOW (ref 135–145)

## 2013-01-28 LAB — CBC WITH DIFFERENTIAL/PLATELET
Basophils Absolute: 0 10*3/uL (ref 0.0–0.1)
Basophils Relative: 0 % (ref 0–1)
Eosinophils Absolute: 0 10*3/uL (ref 0.0–0.7)
Eosinophils Relative: 0 % (ref 0–5)
HCT: 35 % — ABNORMAL LOW (ref 39.0–52.0)
Hemoglobin: 11.8 g/dL — ABNORMAL LOW (ref 13.0–17.0)
Lymphocytes Relative: 5 % — ABNORMAL LOW (ref 12–46)
Lymphs Abs: 1.6 10*3/uL (ref 0.7–4.0)
MCH: 29.3 pg (ref 26.0–34.0)
MCHC: 33.7 g/dL (ref 30.0–36.0)
MCV: 86.8 fL (ref 78.0–100.0)
Monocytes Absolute: 3.8 10*3/uL — ABNORMAL HIGH (ref 0.1–1.0)
Monocytes Relative: 12 % (ref 3–12)
Neutro Abs: 26.6 10*3/uL — ABNORMAL HIGH (ref 1.7–7.7)
Neutrophils Relative %: 83 % — ABNORMAL HIGH (ref 43–77)
Platelets: 411 10*3/uL — ABNORMAL HIGH (ref 150–400)
RBC: 4.03 MIL/uL — ABNORMAL LOW (ref 4.22–5.81)
RDW: 14.9 % (ref 11.5–15.5)
WBC: 32 10*3/uL — ABNORMAL HIGH (ref 4.0–10.5)

## 2013-01-28 LAB — URINALYSIS W MICROSCOPIC + REFLEX CULTURE
Bilirubin Urine: NEGATIVE
Glucose, UA: NEGATIVE mg/dL
Ketones, ur: NEGATIVE mg/dL
Nitrite: POSITIVE — AB
Protein, ur: 30 mg/dL — AB
Specific Gravity, Urine: 1.01 (ref 1.005–1.030)
Urobilinogen, UA: 0.2 mg/dL (ref 0.0–1.0)
pH: 7 (ref 5.0–8.0)

## 2013-01-28 LAB — LACTIC ACID, PLASMA: Lactic Acid, Venous: 1.9 mmol/L (ref 0.5–2.2)

## 2013-01-28 MED ORDER — OXYCODONE HCL 5 MG PO TABS: 5.0000 mg | ORAL_TABLET | ORAL | Status: DC | PRN

## 2013-01-28 MED ORDER — FERROUS SULFATE 325 (65 FE) MG PO TABS
325.0000 mg | ORAL_TABLET | Freq: Every day | ORAL | Status: DC
  Administered 2013-01-29 – 2013-02-03 (×6): 325 mg via ORAL
  Filled 2013-01-28 (×6): qty 1

## 2013-01-28 MED ORDER — ACETAMINOPHEN 650 MG RE SUPP
650.0000 mg | Freq: Four times a day (QID) | RECTAL | Status: DC | PRN
  Filled 2013-01-28: qty 1

## 2013-01-28 MED ORDER — ACETAMINOPHEN 325 MG PO TABS
650.0000 mg | ORAL_TABLET | Freq: Four times a day (QID) | ORAL | Status: DC | PRN
  Administered 2013-01-29 – 2013-02-03 (×8): 650 mg via ORAL
  Filled 2013-01-28 (×8): qty 2

## 2013-01-28 MED ORDER — ALBUTEROL SULFATE (5 MG/ML) 0.5% IN NEBU: 2.5000 mg | INHALATION_SOLUTION | RESPIRATORY_TRACT | Status: DC | PRN

## 2013-01-28 MED ORDER — DEXTROSE 5 % IV SOLN
1.0000 g | INTRAVENOUS | Status: DC
  Administered 2013-01-29: 1 g via INTRAVENOUS
  Filled 2013-01-28 (×2): qty 10

## 2013-01-28 MED ORDER — DEXTROSE 5 % IV SOLN
1.0000 g | Freq: Once | INTRAVENOUS | Status: AC
  Administered 2013-01-28: 1 g via INTRAVENOUS
  Filled 2013-01-28 (×2): qty 10

## 2013-01-28 MED ORDER — PNEUMOCOCCAL VAC POLYVALENT 25 MCG/0.5ML IJ INJ
0.5000 mL | INJECTION | INTRAMUSCULAR | Status: AC
  Administered 2013-01-29: 0.5 mL via INTRAMUSCULAR
  Filled 2013-01-28: qty 0.5

## 2013-01-28 MED ORDER — ACETAMINOPHEN 325 MG PO TABS
650.0000 mg | ORAL_TABLET | Freq: Once | ORAL | Status: AC
  Administered 2013-01-28: 650 mg via ORAL
  Filled 2013-01-28: qty 2

## 2013-01-28 MED ORDER — TAMSULOSIN HCL 0.4 MG PO CAPS
0.4000 mg | ORAL_CAPSULE | Freq: Every day | ORAL | Status: DC
  Administered 2013-01-29 – 2013-02-03 (×6): 0.4 mg via ORAL
  Filled 2013-01-28 (×6): qty 1

## 2013-01-28 MED ORDER — PANTOPRAZOLE SODIUM 40 MG PO TBEC
40.0000 mg | DELAYED_RELEASE_TABLET | Freq: Every day | ORAL | Status: DC
  Administered 2013-01-29 – 2013-02-03 (×6): 40 mg via ORAL
  Filled 2013-01-28 (×6): qty 1

## 2013-01-28 MED ORDER — SODIUM CHLORIDE 0.9 % IJ SOLN
3.0000 mL | Freq: Two times a day (BID) | INTRAMUSCULAR | Status: DC
  Administered 2013-01-28 – 2013-02-03 (×8): 3 mL via INTRAVENOUS

## 2013-01-28 MED ORDER — ALBUTEROL SULFATE HFA 108 (90 BASE) MCG/ACT IN AERS: 2.0000 | INHALATION_SPRAY | RESPIRATORY_TRACT | Status: DC

## 2013-01-28 MED ORDER — SODIUM CHLORIDE 0.9 % IV SOLN
INTRAVENOUS | Status: DC
  Administered 2013-01-28 – 2013-01-30 (×2): via INTRAVENOUS

## 2013-01-28 MED ORDER — SODIUM CHLORIDE 0.9 % IV SOLN
INTRAVENOUS | Status: DC
  Administered 2013-01-28 (×2): via INTRAVENOUS

## 2013-01-28 MED ORDER — ALUM & MAG HYDROXIDE-SIMETH 200-200-20 MG/5ML PO SUSP: 30.0000 mL | Freq: Four times a day (QID) | ORAL | Status: DC | PRN

## 2013-01-28 MED ORDER — SERTRALINE HCL 50 MG PO TABS
100.0000 mg | ORAL_TABLET | Freq: Two times a day (BID) | ORAL | Status: DC
  Administered 2013-01-28 – 2013-02-03 (×12): 100 mg via ORAL
  Filled 2013-01-28 (×13): qty 2

## 2013-01-28 MED ORDER — LORAZEPAM 1 MG PO TABS
1.0000 mg | ORAL_TABLET | Freq: Four times a day (QID) | ORAL | Status: DC | PRN
Start: 2013-01-28 — End: 2013-02-03
  Administered 2013-01-28 – 2013-02-03 (×5): 1 mg via ORAL
  Filled 2013-01-28 (×7): qty 1

## 2013-01-28 MED ORDER — HEPARIN SODIUM (PORCINE) 5000 UNIT/ML IJ SOLN
5000.0000 [IU] | Freq: Three times a day (TID) | INTRAMUSCULAR | Status: DC
  Administered 2013-01-28 – 2013-01-29 (×4): 5000 [IU] via SUBCUTANEOUS
  Filled 2013-01-28 (×7): qty 1

## 2013-01-28 MED ORDER — ONDANSETRON HCL 4 MG PO TABS: 4.0000 mg | ORAL_TABLET | Freq: Four times a day (QID) | ORAL | Status: DC | PRN

## 2013-01-28 MED ORDER — POLYETHYLENE GLYCOL 3350 17 G PO PACK: 17.0000 g | PACK | Freq: Every day | ORAL | Status: DC | PRN

## 2013-01-28 MED ORDER — ONDANSETRON HCL 4 MG/2ML IJ SOLN: 4.0000 mg | Freq: Four times a day (QID) | INTRAMUSCULAR | Status: DC | PRN

## 2013-01-28 MED ORDER — POLYETHYLENE GLYCOL 3350 17 GM/SCOOP PO POWD: 17.0000 g | Freq: Every day | ORAL | Status: DC | PRN

## 2013-01-28 MED ORDER — DEXTROSE 5 % IV SOLN
INTRAVENOUS | Status: AC
  Filled 2013-01-28: qty 10

## 2013-01-28 NOTE — ED Provider Notes (Signed)
CSN: [id number]     Arrival date & time 01/28/13  1344 History   First MD Initiated Contact with Patient 01/28/13 1358     Chief Complaint  Patient presents with  . Fever    HPI Pt was seen at 1400.  Per EMS and pt report, c/o gradual onset and persistence of constant home fevers/chills for the past 4 days. Pt states "it hurts when I urinate." States Home Health staff told him he had a temp of "101.2" today. States his symptoms began after he was discharged from a NH on Monday. Endorses hx etoh abuse, but has not drank any etoh since NH discharge. Denies sore throat, no rash, no CP/palpitations, no cough/SOB, no abd pain, no N/V/D, no back pain.    Past Medical History  Diagnosis Date  . Depression   . Elevated PSA   . Arthritis   . Hyperlipidemia   . Cataract   . Anxiety   . Bipolar 1 disorder   . ETOH abuse     quit Oct 2013, then relapsed, as of Dec 2013 now has abstained X 1 month  . COPD (chronic obstructive pulmonary disease)   . Asthma   . Allergy   . Hypertension   . GERD (gastroesophageal reflux disease)    Past Surgical History  Procedure Laterality Date  . Colonoscopy  12/21/09    VHQ:IONG papilla otherwise normal/ pancolonic diverticula/mutiple colonic poylps  . Spine surgery    . Back surgery      lunbar  . Colonoscopy with esophagogastroduodenoscopy (egd) N/A 10/20/2012    Procedure: COLONOSCOPY WITH ESOPHAGOGASTRODUODENOSCOPY (EGD);  Surgeon: Corbin Ade, MD;  Location: [facility];  Service: Endoscopy;  Laterality: N/A;  10;15  . Orif hip fracture Left 11/24/2012    Procedure: OPEN REDUCTION INTERNAL FIXATION HIP;  Surgeon: Darreld Mclean, MD;  Location: [facility] ORS;  Service: Orthopedics;  Laterality: Left;   Family History  Problem Relation Age of Onset  . Colon cancer Neg Hx   . Mental illness Sister    History  Substance Use Topics  . Smoking status: Current Every Day Smoker -- 1.00 packs/day for 30 years    Types: Cigars  . Smokeless tobacco: Not on  file  . Alcohol Use: 18.0 oz/week    30 Glasses of wine per week     Comment: history of  about a liter of wine per day-before that    Review of Systems ROS: Statement: All systems negative except as marked or noted in the HPI; Constitutional: +fever and chills. ; ; Eyes: Negative for eye pain, redness and discharge. ; ; ENMT: Negative for ear pain, hoarseness, nasal congestion, sinus pressure and sore throat. ; ; Cardiovascular: Negative for chest pain, palpitations, diaphoresis, dyspnea and peripheral edema. ; ; Respiratory: Negative for cough, wheezing and stridor. ; ; Gastrointestinal: Negative for nausea, vomiting, diarrhea, abdominal pain, blood in stool, hematemesis, jaundice and rectal bleeding.  ; ; Genitourinary: +dysuria. Negative for flank pain and hematuria. ; ; Genital:  No penile drainage or rash, no testicular pain or swelling, no scrotal rash or swelling.;; Musculoskeletal: Negative for back pain and neck pain. Negative for swelling and trauma.; ; Skin: Negative for pruritus, rash, abrasions, blisters, bruising and skin lesion.; ; Neuro: Negative for headache, lightheadedness and neck stiffness. Negative for weakness, altered level of consciousness , altered mental status, extremity weakness, paresthesias, involuntary movement, seizure and syncope.      Allergies  Review of patient's allergies indicates no known allergies.  Home Medications   Current Outpatient Rx  Name  Route  Sig  Dispense  Refill  . amLODipine (NORVASC) 10 MG tablet   Oral   Take 1 tablet (10 mg total) by mouth daily.   90 tablet   3   . ferrous sulfate 325 (65 FE) MG tablet   Oral   Take 325 mg by mouth daily with breakfast.         . LORazepam (ATIVAN) 1 MG tablet   Oral   Take 1 mg by mouth every 6 (six) hours as needed for anxiety.         . Multiple Vitamins-Minerals (CERTAVITE/ANTIOXIDANTS PO)   Oral   Take 1 tablet by mouth daily.         Marland Kitchen omeprazole (PRILOSEC) 20 MG capsule    Oral   Take 1 capsule (20 mg total) by mouth daily.   90 capsule   3   . polyethylene glycol powder (MIRALAX) powder   Oral   Take 17 g by mouth daily as needed for mild constipation.         . sertraline (ZOLOFT) 100 MG tablet   Oral   Take 100 mg by mouth 2 (two) times daily.         Marland Kitchen albuterol (PROVENTIL HFA;VENTOLIN HFA) 108 (90 BASE) MCG/ACT inhaler   Inhalation   Inhale 2 puffs into the lungs as directed.   1 Inhaler   2     Every 4-6 hrs as needed   . EXPIRED: enoxaparin (LOVENOX) 40 MG/0.4ML injection   Subcutaneous   Inject 0.4 mLs (40 mg total) into the skin daily.   0 Syringe       BP 120/76  Pulse 116  Temp(Src) 102.7 F (39.3 C) (Rectal)  Resp 20  Ht 6\' 2"  (1.88 m)  Wt 143 lb (64.864 kg)  BMI 18.35 kg/m2 Physical Exam 1405: Physical examination:  Nursing notes reviewed; Vital signs and O2 SAT reviewed; +febrile;; Constitutional: Well developed, Well nourished, In no acute distress; Head:  Normocephalic, atraumatic; Eyes: EOMI, PERRL, No scleral icterus; ENMT: Mouth and pharynx normal, Mucous membranes dry;; Neck: Supple, Full range of motion, No lymphadenopathy; Cardiovascular: Tachycardic rate and rhythm, No gallop; Respiratory: Breath sounds clear & equal bilaterally, No wheezes. Speaking full sentences with ease, Normal respiratory effort/excursion; Chest: Nontender, Movement normal; Abdomen: Soft, Nontender, Normal bowel sounds; Genitourinary: No CVA tenderness; Extremities: Pulses normal, No tenderness, No edema, No calf edema or asymmetry.; Neuro: AA&Ox3, Major CN grossly intact.  Speech clear. No gross focal motor or sensory deficits in extremities.; Skin: Color normal, Warm, Dry.   ED Course  Procedures    EKG Interpretation   None       MDM  MDM Reviewed: previous chart, nursing note and vitals Reviewed previous: labs and x-ray Interpretation: labs and x-ray      Results for orders placed during the hospital encounter of 01/28/13   CBC WITH DIFFERENTIAL      Result Value Range   WBC 32.0 (*) 4.0 - 10.5 K/uL   RBC 4.03 (*) 4.22 - 5.81 MIL/uL   Hemoglobin 11.8 (*) 13.0 - 17.0 g/dL   HCT 16.1 (*) 09.6 - 04.5 %   MCV 86.8  78.0 - 100.0 fL   MCH 29.3  26.0 - 34.0 pg   MCHC 33.7  30.0 - 36.0 g/dL   RDW 40.9  81.1 - 91.4 %   Platelets 411 (*) 150 - 400 K/uL   Neutrophils Relative %  83 (*) 43 - 77 %   Lymphocytes Relative 5 (*) 12 - 46 %   Monocytes Relative 12  3 - 12 %   Eosinophils Relative 0  0 - 5 %   Basophils Relative 0  0 - 1 %   Neutro Abs 26.6 (*) 1.7 - 7.7 K/uL   Lymphs Abs 1.6  0.7 - 4.0 K/uL   Monocytes Absolute 3.8 (*) 0.1 - 1.0 K/uL   Eosinophils Absolute 0.0  0.0 - 0.7 K/uL   Basophils Absolute 0.0  0.0 - 0.1 K/uL   WBC Morphology MILD LEFT SHIFT (1-5% METAS, OCC MYELO, OCC BANDS)    BASIC METABOLIC PANEL      Result Value Range   Sodium 130 (*) 135 - 145 mEq/L   Potassium 4.3  3.5 - 5.1 mEq/L   Chloride 92 (*) 96 - 112 mEq/L   CO2 26  19 - 32 mEq/L   Glucose, Bld 120 (*) 70 - 99 mg/dL   BUN 36 (*) 6 - 23 mg/dL   Creatinine, Ser 1.61 (*) 0.50 - 1.35 mg/dL   Calcium 09.6  8.4 - 04.5 mg/dL   GFR calc non Af Amer 39 (*) >90 mL/min   GFR calc Af Amer 45 (*) >90 mL/min  URINALYSIS W MICROSCOPIC + REFLEX CULTURE      Result Value Range   Color, Urine YELLOW  YELLOW   APPearance CLEAR  CLEAR   Specific Gravity, Urine 1.010  1.005 - 1.030   pH 7.0  5.0 - 8.0   Glucose, UA NEGATIVE  NEGATIVE mg/dL   Hgb urine dipstick TRACE (*) NEGATIVE   Bilirubin Urine NEGATIVE  NEGATIVE   Ketones, ur NEGATIVE  NEGATIVE mg/dL   Protein, ur 30 (*) NEGATIVE mg/dL   Urobilinogen, UA 0.2  0.0 - 1.0 mg/dL   Nitrite POSITIVE (*) NEGATIVE   Leukocytes, UA LARGE (*) NEGATIVE   WBC, UA TOO NUMEROUS TO COUNT  <3 WBC/hpf   RBC / HPF 0-2  <3 RBC/hpf   Bacteria, UA MANY (*) RARE   Squamous Epithelial / LPF RARE  RARE   Dg Chest 1 View 01/28/2013   CLINICAL DATA:  Fever.  COPD.  Shortness of breath.  Asthma.  EXAM:  CHEST - 1 VIEW  COMPARISON:  11/23/2012.  FINDINGS: Normal sized heart. Clear lungs. Lower thoracic spine degenerative changes. Old, healed right 6th rib fracture.  IMPRESSION: No acute abnormality.   Electronically Signed   By: Gordan Payment M.D.   On: 01/28/2013 15:51    Results for LEONCE, BALE (MRN [id number]) as of 01/28/2013 16:11  Ref. Range 11/27/2012 06:20 11/28/2012 04:47 11/29/2012 05:08 11/30/2012 04:43 01/28/2013 14:50  BUN Latest Range: 4-21 mg/dL 7 10 11 11  36 (H)  Creatinine Latest Range: 0.50-1.35 mg/dL 7.82 (L) 9.56 (L) 2.13 (L) 0.39 (L) 1.81 (H)    1615:  +UTI, UC pending; will dose IV rocephin. APAP given for fever. Dx and testing d/w pt.  Questions answered.  Verb understanding, agreeable to admit. T/C to [facility]. Jerral Ralph, case discussed, including:  HPI, pertinent PM/SHx, VS/PE, dx testing, ED course and treatment:  Agreeable to admit, requests to write temporary orders, obtain tele bed to team 1.      Laray Anger, DO 01/29/13 1545

## 2013-01-28 NOTE — ED Notes (Signed)
Pt c/o fever, chills, and dysuria. Pt states he has hx of alcohol and "shakes all the time". Pt denies any pain or SOB.

## 2013-01-28 NOTE — Telephone Encounter (Signed)
[facility] nurse called with med change

## 2013-01-28 NOTE — Progress Notes (Signed)
Pt's output 1250cc after insertion of foley catheter.

## 2013-01-28 NOTE — Telephone Encounter (Signed)
This encounter was created in error - please disregard.

## 2013-01-28 NOTE — ED Notes (Signed)
EMS reports pt was discharged from [facility] Monday.  Reports was there for 3 months for therapy.  Pt has history of etoh abuse.  Pt shakey, reports has ativan every 6 hours prn for his nerves.  Home health reported low grade fever yesterday then increased to 101.2 today.    Pt reports burns with urination.

## 2013-01-28 NOTE — H&P (Signed)
PATIENT DETAILS Name: Duane Richardson Age: 57 y.o. Sex: male Date of Birth: 11-Mar-1955 Admit Date: 01/28/2013 WUJ:WJXBJYN,WGNFAO TOM, MD   CHIEF COMPLAINT:  Fever, dysuria and lower abdominal pain.  HPI: Duane Richardson is a 57 y.o. male with a Past Medical History of recent left hip fracture status post repair, history of EtOH abuse (claims not to have abused alcohol for the past in 90 days) depression, gastroesophageal reflux disease who presents today with the above noted complaint. Patient was recently hospitalized and discharged in early October, he was discharged to a skilled nursing facility, and he just went back home this past Monday. He claims while in the skilled nursing facility he had a Foley catheter that was discontinued approximately a week prior to his discharge from the skilled nursing facility. He presents to the emergency room today for fever, foul-smelling urine and dysuria, that he claims has been going on for the past 2-4 days. She claims that ever since the Foley catheter was removed, he has had issues with hesitancy, poor stream and urgency. He also has to strain to improve his stream. In the ED he was found to be febrile with a temperature of 102.7 degrees Fahrenheit. He was also found to have significant leukocytosis and acute renal failure. I was subsequently asked to admit this patient for further evaluation and treatment. During my evaluation, patient seemed comfortable, he denied headaches, chest pain, shortness of breath, cough, nausea, vomiting, diarrhea. He denied abdominal pain.   ALLERGIES:  No Known Allergies  PAST MEDICAL HISTORY: Past Medical History  Diagnosis Date  . Depression   . Elevated PSA   . Arthritis   . Hyperlipidemia   . Cataract   . Anxiety   . Bipolar 1 disorder   . ETOH abuse     quit Oct 2013, then relapsed, as of Dec 2013 now has abstained X 1 month  . COPD (chronic obstructive pulmonary disease)   . Asthma   . Allergy   .  Hypertension   . GERD (gastroesophageal reflux disease)     PAST SURGICAL HISTORY: Past Surgical History  Procedure Laterality Date  . Colonoscopy  12/21/09    ZHY:QMVH papilla otherwise normal/ pancolonic diverticula/mutiple colonic poylps  . Spine surgery    . Back surgery      lunbar  . Colonoscopy with esophagogastroduodenoscopy (egd) N/A 10/20/2012    Procedure: COLONOSCOPY WITH ESOPHAGOGASTRODUODENOSCOPY (EGD);  Surgeon: Corbin Ade, MD;  Location: [facility];  Service: Endoscopy;  Laterality: N/A;  10;15  . Orif hip fracture Left 11/24/2012    Procedure: OPEN REDUCTION INTERNAL FIXATION HIP;  Surgeon: Darreld Mclean, MD;  Location: [facility];  Service: Orthopedics;  Laterality: Left;    MEDICATIONS AT HOME: Prior to Admission medications   Medication Sig Start Date End Date Taking? Authorizing Provider  amLODipine (NORVASC) 10 MG tablet Take 1 tablet (10 mg total) by mouth daily. 09/01/12  Yes Mary B Dixon, PA-C  ferrous sulfate 325 (65 FE) MG tablet Take 325 mg by mouth daily with breakfast.   Yes Historical Provider, MD  LORazepam (ATIVAN) 1 MG tablet Take 1 mg by mouth every 6 (six) hours as needed for anxiety.   Yes Historical Provider, MD  Multiple Vitamins-Minerals (CERTAVITE/ANTIOXIDANTS PO) Take 1 tablet by mouth daily.   Yes Historical Provider, MD  omeprazole (PRILOSEC) 20 MG capsule Take 1 capsule (20 mg total) by mouth daily. 09/01/12  Yes Mary B Dixon, PA-C  polyethylene glycol powder (MIRALAX) powder Take  17 g by mouth daily as needed for mild constipation.   Yes Historical Provider, MD  sertraline (ZOLOFT) 100 MG tablet Take 100 mg by mouth 2 (two) times daily.   Yes Historical Provider, MD  albuterol (PROVENTIL HFA;VENTOLIN HFA) 108 (90 BASE) MCG/ACT inhaler Inhale 2 puffs into the lungs as directed. 01/28/13   Patriciaann Clan Dixon, PA-C  enoxaparin (LOVENOX) 40 MG/0.4ML injection Inject 0.4 mLs (40 mg total) into the skin daily. 11/30/12 12/28/12  Esperanza Sheets, MD     FAMILY HISTORY: Family History  Problem Relation Age of Onset  . Colon cancer Neg Hx   . Mental illness Sister     SOCIAL HISTORY:  reports that he has been smoking Cigars.  He does not have any smokeless tobacco history on file. He reports that he drinks about 18.0 ounces of alcohol per week. He reports that he does not use illicit drugs.  REVIEW OF SYSTEMS:  Constitutional:   No  weight loss, night sweats,  Fevers, chills, fatigue.  HEENT:    No headaches, Difficulty swallowing,Tooth/dental problems,Sore throat,  No sneezing, itching, ear ache, nasal congestion, post nasal drip,   Cardio-vascular: No chest pain,  Orthopnea, PND, swelling in lower extremities, anasarca,         dizziness, palpitations  GI:  No heartburn, indigestion, abdominal pain, nausea, vomiting, diarrhea, change in       bowel habits, loss of appetite  Resp: No shortness of breath with exertion or at rest.  No excess mucus, no productive cough, No non-productive cough,  No coughing up of blood.No change in color of mucus.No wheezing.No chest wall deformity  Skin:  no rash or lesions.  GU:  no dysuria, change in color of urine, no urgency or frequency.  No flank pain.  Musculoskeletal: No joint pain or swelling.  No decreased range of motion.  No back pain.  Psych: No change in mood or affect. No depression or anxiety.  No memory loss.   PHYSICAL EXAM: Blood pressure 120/76, pulse 116, temperature 102.7 F (39.3 C), temperature source Rectal, resp. rate 20, height 6\' 2"  (1.88 m), weight 64.864 kg (143 lb).  General appearance :Awake, alert, not in any distress. Speech Clear. Not toxic Looking HEENT: Atraumatic and Normocephalic, pupils equally reactive to light and accomodation Neck: supple, no JVD. No cervical lymphadenopathy.  Chest:Good air entry bilaterally, no added sounds  CVS: S1 S2 regular, no murmurs.  Abdomen: Bowel sounds present, distended bladder almost all the way up to be a  mucus, slightly tender. No guarding or rigidity seen.  Extremities: B/L Lower Ext shows no edema, both legs are warm to touch Neurology: Awake alert, and oriented X 3, CN II-XII intact, Non focal. Tremors seen-per patient this is chronic. Skin:No Rash Wounds:N/A  LABS ON ADMISSION:   Recent Labs  01/28/13 1450  NA 130*  K 4.3  CL 92*  CO2 26  GLUCOSE 120*  BUN 36*  CREATININE 1.81*  CALCIUM 10.1   No results found for this basename: AST, ALT, ALKPHOS, BILITOT, PROT, ALBUMIN,  in the last 72 hours No results found for this basename: LIPASE, AMYLASE,  in the last 72 hours  Recent Labs  01/28/13 1450  WBC 32.0*  NEUTROABS 26.6*  HGB 11.8*  HCT 35.0*  MCV 86.8  PLT 411*   No results found for this basename: CKTOTAL, CKMB, CKMBINDEX, TROPONINI,  in the last 72 hours No results found for this basename: DDIMER,  in the last 72 hours No  components found with this basename: POCBNP,    RADIOLOGIC STUDIES ON ADMISSION: Dg Chest 1 View  01/28/2013   CLINICAL DATA:  Fever.  COPD.  Shortness of breath.  Asthma.  EXAM: CHEST - 1 VIEW  COMPARISON:  11/23/2012.  FINDINGS: Normal sized heart. Clear lungs. Lower thoracic spine degenerative changes. Old, healed right 6th rib fracture.  IMPRESSION: No acute abnormality.   Electronically Signed   By: Gordan Payment M.D.   On: 01/28/2013 15:51    ASSESSMENT AND PLAN: Present on Admission:  . SIRS (systemic inflammatory response syndrome) - Secondary to UTI. Will treat with IV fluids, antibiotics.   Marland Kitchen UTI (lower urinary tract infection) - Suspect acute urinary retention/BPH playing a major role here. We'll obtain urine and blood cultures, we'll continue with IV Rocephin. Hydrate overnight. Reassess tomorrow. Depending on culture results, will taper antibiotics accordingly.   . Acute urinary retention - On exam, patient has a distended bladder. A bedside bladder ultrasound was done which showed more than one thousand cc of urine. Will start  on Flomax, place a Foley catheter, urology consultation will be obtained.   . Acute renal failure  - Suspect this is obstructive uropathy  - Hydrate, place Foley, anticipate creatinine to improve with these measures.   Marland Kitchen DEPRESSION - Stable, continue Zoloft   . ETOH abuse - Claims not to have had any EtOH use in the past 90 days. He recently got out of the hospital only 3 days ago, he claims he has chronic tremors. Will continue with home dose of Ativan. We'll watch for any signs of withdrawal.   . GERD - Continue PPI  Further plan will depend as patient's clinical course evolves and further radiologic and laboratory data become available. Patient will be monitored closely.  DVT Prophylaxis: Prophylactic Heparin  Code Status: Full Code  Total time spent for admission equals 45 minutes.  [facility] Pager [phone]  If 7PM-7AM, please contact night-coverage [email or web address] Password [email or web address] 01/28/2013, 5:11 PM

## 2013-01-29 DIAGNOSIS — R509 Fever, unspecified: Secondary | ICD-10-CM

## 2013-01-29 DIAGNOSIS — F329 Major depressive disorder, single episode, unspecified: Secondary | ICD-10-CM

## 2013-01-29 LAB — BASIC METABOLIC PANEL
BUN: 31 mg/dL — ABNORMAL HIGH (ref 6–23)
CO2: 20 mEq/L (ref 19–32)
Calcium: 8.6 mg/dL (ref 8.4–10.5)
Chloride: 100 mEq/L (ref 96–112)
Creatinine, Ser: 1.41 mg/dL — ABNORMAL HIGH (ref 0.50–1.35)
GFR calc Af Amer: 62 mL/min — ABNORMAL LOW (ref >90)
GFR calc non Af Amer: 53 mL/min — ABNORMAL LOW (ref >90)
Glucose, Bld: 169 mg/dL — ABNORMAL HIGH (ref 70–99)
Potassium: 3.6 mEq/L (ref 3.5–5.1)
Sodium: 132 mEq/L — ABNORMAL LOW (ref 135–145)

## 2013-01-29 LAB — CBC
HCT: 31.8 % — ABNORMAL LOW (ref 39.0–52.0)
Hemoglobin: 10.5 g/dL — ABNORMAL LOW (ref 13.0–17.0)
MCH: 28.6 pg (ref 26.0–34.0)
MCHC: 33 g/dL (ref 30.0–36.0)
MCV: 86.6 fL (ref 78.0–100.0)
Platelets: 388 10*3/uL (ref 150–400)
RBC: 3.67 MIL/uL — ABNORMAL LOW (ref 4.22–5.81)
RDW: 14.6 % (ref 11.5–15.5)
WBC: 26.1 10*3/uL — ABNORMAL HIGH (ref 4.0–10.5)

## 2013-01-29 MED ORDER — ENSURE COMPLETE PO LIQD
237.0000 mL | Freq: Two times a day (BID) | ORAL | Status: DC
  Administered 2013-01-29 – 2013-02-03 (×9): 237 mL via ORAL

## 2013-01-29 NOTE — Progress Notes (Addendum)
PATIENT DETAILS Name: Duane Richardson Age: 57 y.o. Sex: male Date of Birth: 1955/03/05 Admit Date: 01/28/2013 Admitting Physician Dewayne Shorter Levora Dredge, MD WUJ:WJXBJYN,WGNFAO TOM, MD  Subjective: Feels much better. Afebrile overnight.  Assessment/Plan: Principal Problem:   SIRS (systemic inflammatory response syndrome) -SIRS (systemic inflammatory response syndrome)  - Secondary to UTI and gram neg bacteremia. Continue to treat with IV fluids, antibiotics.   Active Problems: Gram-negative bacteremia - Likely from UTI. - Continue Rocephin - Await final culture sensitivity. - Afebrile, leukocytosis decreasing, WBC down to 26.1  UTI (lower urinary tract infection)  - Suspect acute urinary retention/BPH playing a major role here - Continue Rocephin, await further culture data.  Acute renal failure  - Suspect this is obstructive uropathy  -  creatinine improving with placement of foley  Acute urinary retention - On admission, was found to have acute urinary retention-Foley catheter was placed and 1.2 L of urine was drained. - Continue Flomax, await urology evaluation. - Suspected BPH as the underlying etiology.  DEPRESSION  - Stable, continue Zoloft   . ETOH abuse  - Claims not to have had any EtOH use in the past 90 days. He recently got out of the hospital only 3 days ago, he claims he has chronic tremors. Will continue with home dose of Ativan. We'll watch for any signs of withdrawal.   . GERD  - Continue PPI  . History of chronic tremors - Stable  Disposition: Remain inpatient  DVT Prophylaxis: Prophylactic Heparin  Code Status: Full code  Family Communication None at bedside  Procedures:  None  CONSULTS:  urology  Time spent 40 minutes-which includes 50% of the time with face-to-face with patient  MEDICATIONS: Scheduled Meds: . cefTRIAXone (ROCEPHIN)  IV  1 g Intravenous Q24H  . ferrous sulfate  325 mg Oral Q breakfast  . heparin  5,000  Units Subcutaneous Q8H  . pantoprazole  40 mg Oral Daily  . sertraline  100 mg Oral BID  . sodium chloride  3 mL Intravenous Q12H  . tamsulosin  0.4 mg Oral Daily   Continuous Infusions: . sodium chloride 150 mL/hr at 01/28/13 2250   PRN Meds:.acetaminophen, acetaminophen, albuterol, alum & mag hydroxide-simeth, LORazepam, ondansetron (ZOFRAN) IV, ondansetron, oxyCODONE, polyethylene glycol  Antibiotics: Anti-infectives   Start     Dose/Rate Route Frequency Ordered Stop   01/29/13 1800  cefTRIAXone (ROCEPHIN) 1 g in dextrose 5 % 50 mL IVPB     1 g 100 mL/hr over 30 Minutes Intravenous Every 24 hours 01/28/13 2206     01/28/13 1615  cefTRIAXone (ROCEPHIN) 1 g in dextrose 5 % 50 mL IVPB     1 g 100 mL/hr over 30 Minutes Intravenous  Once 01/28/13 1611 01/28/13 1915       PHYSICAL EXAM: Vital signs in last 24 hours: Filed Vitals:   01/28/13 1915 01/28/13 2134 01/28/13 2323 01/29/13 0620  BP:  148/47  107/69  Pulse:  114 75 103  Temp: 98.2 F (36.8 C) 98.9 F (37.2 C)  98 F (36.7 C)  TempSrc: Oral Oral    Resp:  24 20 20   Height:  6\' 2"  (1.88 m)    Weight:  57.8 kg (127 lb 6.8 oz)    SpO2:  98% 95% 99%    Weight change:  Filed Weights   01/28/13 1353 01/28/13 2134  Weight: 64.864 kg (143 lb) 57.8 kg (127 lb 6.8 oz)   Body mass index is 16.35 kg/(m^2).   Gen Exam: Awake  and alert with clear speech.   Neck: Supple, No JVD.   Chest: B/L Clear.   CVS: S1 S2 Regular, no murmurs.  Abdomen: soft, BS +, non tender, non distended.  Extremities: no edema, lower extremities warm to touch. Neurologic: Non Focal.  From is essentially unchanged. Skin: No Rash.   Wounds: N/A.    Intake/Output from previous day:  Intake/Output Summary (Last 24 hours) at 01/29/13 1147 Last data filed at 01/29/13 0900  Gross per 24 hour  Intake   2700 ml  Output   3075 ml  Net   -375 ml     LAB RESULTS: CBC  Recent Labs Lab 01/28/13 1450 01/29/13 0543  WBC 32.0* 26.1*  HGB  11.8* 10.5*  HCT 35.0* 31.8*  PLT 411* 388  MCV 86.8 86.6  MCH 29.3 28.6  MCHC 33.7 33.0  RDW 14.9 14.6  LYMPHSABS 1.6  --   MONOABS 3.8*  --   EOSABS 0.0  --   BASOSABS 0.0  --     Chemistries   Recent Labs Lab 01/28/13 1450 01/29/13 0543  NA 130* 132*  K 4.3 3.6  CL 92* 100  CO2 26 20  GLUCOSE 120* 169*  BUN 36* 31*  CREATININE 1.81* 1.41*  CALCIUM 10.1 8.6    CBG: No results found for this basename: GLUCAP,  in the last 168 hours  GFR Estimated Creatinine Clearance: 46.1 ml/min (by C-G formula based on Cr of 1.41).  Coagulation profile No results found for this basename: INR, PROTIME,  in the last 168 hours  Cardiac Enzymes No results found for this basename: CK, CKMB, TROPONINI, MYOGLOBIN,  in the last 168 hours  No components found with this basename: POCBNP,  No results found for this basename: DDIMER,  in the last 72 hours No results found for this basename: HGBA1C,  in the last 72 hours No results found for this basename: CHOL, HDL, LDLCALC, TRIG, CHOLHDL, LDLDIRECT,  in the last 72 hours No results found for this basename: TSH, T4TOTAL, FREET3, T3FREE, THYROIDAB,  in the last 72 hours No results found for this basename: VITAMINB12, FOLATE, FERRITIN, TIBC, IRON, RETICCTPCT,  in the last 72 hours No results found for this basename: LIPASE, AMYLASE,  in the last 72 hours  Urine Studies No results found for this basename: UACOL, UAPR, USPG, UPH, UTP, UGL, UKET, UBIL, UHGB, UNIT, UROB, ULEU, UEPI, UWBC, URBC, UBAC, CAST, CRYS, UCOM, BILUA,  in the last 72 hours  MICROBIOLOGY: Recent Results (from the past 240 hour(s))  CULTURE, BLOOD (ROUTINE X 2)     Status: None   Collection Time    01/28/13  5:40 PM      Result Value Range Status   Specimen Description RIGHT ANTECUBITAL   Final   Special Requests BOTTLES DRAWN AEROBIC AND ANAEROBIC 10CC EACH   Final   Culture NO GROWTH 1 DAY   Final   Report Status PENDING   Incomplete  CULTURE, BLOOD (ROUTINE X 2)      Status: None   Collection Time    01/28/13  5:45 PM      Result Value Range Status   Specimen Description BLOOD RIGHT HAND   Final   Special Requests BOTTLES DRAWN AEROBIC AND ANAEROBIC 10CC EACH   Final   Culture     Final   Value: GRAM NEGATIVE RODS     Gram Stain Report Called to,Read Back By and Verified With: T. WATKINS AT 1122 ON 01/29/13 BY Wynonia Lawman  Report Status PENDING   Incomplete    RADIOLOGY STUDIES/RESULTS: Dg Chest 1 View  01/28/2013   CLINICAL DATA:  Fever.  COPD.  Shortness of breath.  Asthma.  EXAM: CHEST - 1 VIEW  COMPARISON:  11/23/2012.  FINDINGS: Normal sized heart. Clear lungs. Lower thoracic spine degenerative changes. Old, healed right 6th rib fracture.  IMPRESSION: No acute abnormality.   Electronically Signed   By: Gordan Payment M.D.   On: 01/28/2013 15:51    Jeoffrey Massed, MD  [facility] Pager:[phone]  If 7PM-7AM, please contact night-coverage [email or web address] Password [email or web address] 01/29/2013, 11:47 AM   LOS: 1 day

## 2013-01-29 NOTE — Consult Note (Signed)
Consult dictated.

## 2013-01-29 NOTE — Progress Notes (Signed)
INITIAL NUTRITION ASSESSMENT  DOCUMENTATION CODES Per approved criteria  -Underweight   INTERVENTION: Ensure Complete po BID, each supplement provides 350 kcal and 13 grams of protein.  NUTRITION DIAGNOSIS: Inadequate oral intake related to altered GI function as evidenced by abdominal pain.   Goal: Pt will meet >90% of estimated nutritional needs  Monitor:  PO intake, weight changes, labs, skin assessments, changes in status  Reason for Assessment: MST=2  57 y.o. male  Admitting Dx: SIRS (systemic inflammatory response syndrome)  ASSESSMENT: Pt admitted for fever, dysuria, and abdominal pain. Noted pt was d/c from [facility] earlier this week, where he resided for 3 month for rehab. Pt with hx of ETOH abuse.  Pt unavailable during visit. Noted pt with hx of malnutrition, poor po intake, and wt loss during previous admission. Pt with good PO's (PO: 75%).  Wt hx reveals continued loss. However, wt loss is not significant.  Pt is at risk for malnutrition given hx of malnutrition, hx of ETOH abuse, and continued weight loss.   Height: Ht Readings from Last 1 Encounters:  01/28/13 6\' 2"  (1.88 m)    Weight: Wt Readings from Last 1 Encounters:  01/28/13 127 lb 6.8 oz (57.8 kg)    Ideal Body Weight: 190#  % Ideal Body Weight: 67%  Wt Readings from Last 10 Encounters:  01/28/13 127 lb 6.8 oz (57.8 kg)  11/23/12 138 lb (62.596 kg)  11/23/12 138 lb (62.596 kg)  11/15/12 138 lb (62.596 kg)  11/01/12 130 lb 12.8 oz (59.33 kg)  10/26/12 137 lb (62.143 kg)  10/20/12 132 lb (59.875 kg)  10/20/12 132 lb (59.875 kg)  10/13/12 132 lb 12.8 oz (60.238 kg)  10/12/12 136 lb (61.689 kg)    Usual Body Weight: 160#  % Usual Body Weight: 79%  BMI:  Body mass index is 16.35 kg/(m^2). Meets criteria for underweight.  Estimated Nutritional Needs: Kcal: 1610-9604 daily Protein: 72-87 grams daily Fluid: 1.7-2.0 L daily  Skin: Dry, with ecchymosis, otherwise intact  Diet  Order: Cardiac  EDUCATION NEEDS: -Education not appropriate at this time   Intake/Output Summary (Last 24 hours) at 01/29/13 1509 Last data filed at 01/29/13 1454  Gross per 24 hour  Intake   2940 ml  Output   4075 ml  Net  -1135 ml    Last BM: 01/28/13  Labs:   Recent Labs Lab 01/28/13 1450 01/29/13 0543  NA 130* 132*  K 4.3 3.6  CL 92* 100  CO2 26 20  BUN 36* 31*  CREATININE 1.81* 1.41*  CALCIUM 10.1 8.6  GLUCOSE 120* 169*    CBG (last 3)  No results found for this basename: GLUCAP,  in the last 72 hours  Scheduled Meds: . cefTRIAXone (ROCEPHIN)  IV  1 g Intravenous Q24H  . ferrous sulfate  325 mg Oral Q breakfast  . heparin  5,000 Units Subcutaneous Q8H  . pantoprazole  40 mg Oral Daily  . sertraline  100 mg Oral BID  . sodium chloride  3 mL Intravenous Q12H  . tamsulosin  0.4 mg Oral Daily    Continuous Infusions: . sodium chloride 150 mL/hr at 01/28/13 2250    Past Medical History  Diagnosis Date  . Depression   . Elevated PSA   . Arthritis   . Hyperlipidemia   . Cataract   . Anxiety   . Bipolar 1 disorder   . ETOH abuse     quit Oct 2013, then relapsed, as of Dec 2013 now has  abstained X 1 month  . COPD (chronic obstructive pulmonary disease)   . Asthma   . Allergy   . Hypertension   . GERD (gastroesophageal reflux disease)     Past Surgical History  Procedure Laterality Date  . Colonoscopy  12/21/09    HQI:ONGE papilla otherwise normal/ pancolonic diverticula/mutiple colonic poylps  . Spine surgery    . Back surgery      lunbar  . Colonoscopy with esophagogastroduodenoscopy (egd) N/A 10/20/2012    Procedure: COLONOSCOPY WITH ESOPHAGOGASTRODUODENOSCOPY (EGD);  Surgeon: Corbin Ade, MD;  Location: [facility];  Service: Endoscopy;  Laterality: N/A;  10;15  . Orif hip fracture Left 11/24/2012    Procedure: OPEN REDUCTION INTERNAL FIXATION HIP;  Surgeon: Darreld Mclean, MD;  Location: [facility];  Service: Orthopedics;  Laterality: Left;     Rondrick Barreira A. Mayford Knife, RD, LDN Pager: [phone]

## 2013-01-29 NOTE — Progress Notes (Signed)
CRITICAL VALUE ALERT  Critical value received:  Aerobic gram negative rods   Date of notification:  01/29/13  Time of notification:  1120  Critical value read back yes  Nurse who received alert:  Zannie Kehr   MD notified (1st page):  Ghimire  Time of first page:  1122  MD notified (2nd page):  Time of second page:  Responding MD:  Roseanna Rainbow  Time MD responded:  1123  Continue the Rocephin as previously ordered.

## 2013-01-30 DIAGNOSIS — B9689 Other specified bacterial agents as the cause of diseases classified elsewhere: Secondary | ICD-10-CM

## 2013-01-30 DIAGNOSIS — R7881 Bacteremia: Secondary | ICD-10-CM

## 2013-01-30 LAB — BASIC METABOLIC PANEL
BUN: 21 mg/dL (ref 6–23)
CO2: 20 mEq/L (ref 19–32)
Calcium: 8.7 mg/dL (ref 8.4–10.5)
Chloride: 101 mEq/L (ref 96–112)
Creatinine, Ser: 1.21 mg/dL (ref 0.50–1.35)
GFR calc Af Amer: 74 mL/min — ABNORMAL LOW (ref >90)
GFR calc non Af Amer: 64 mL/min — ABNORMAL LOW (ref >90)
Glucose, Bld: 101 mg/dL — ABNORMAL HIGH (ref 70–99)
Potassium: 3.4 mEq/L — ABNORMAL LOW (ref 3.5–5.1)
Sodium: 134 mEq/L — ABNORMAL LOW (ref 135–145)

## 2013-01-30 LAB — CBC
HCT: 27.5 % — ABNORMAL LOW (ref 39.0–52.0)
Hemoglobin: 9.5 g/dL — ABNORMAL LOW (ref 13.0–17.0)
MCH: 29.8 pg (ref 26.0–34.0)
MCHC: 34.5 g/dL (ref 30.0–36.0)
MCV: 86.2 fL (ref 78.0–100.0)
Platelets: 381 10*3/uL (ref 150–400)
RBC: 3.19 MIL/uL — ABNORMAL LOW (ref 4.22–5.81)
RDW: 15 % (ref 11.5–15.5)
WBC: 21.6 10*3/uL — ABNORMAL HIGH (ref 4.0–10.5)

## 2013-01-30 MED ORDER — POTASSIUM CHLORIDE CRYS ER 20 MEQ PO TBCR
40.0000 meq | EXTENDED_RELEASE_TABLET | Freq: Once | ORAL | Status: AC
  Administered 2013-01-30: 40 meq via ORAL
  Filled 2013-01-30: qty 2

## 2013-01-30 MED ORDER — DEXTROSE 5 % IV SOLN
2.0000 g | INTRAVENOUS | Status: DC
  Administered 2013-01-30 – 2013-02-02 (×3): 2 g via INTRAVENOUS
  Filled 2013-01-30 (×6): qty 2

## 2013-01-30 NOTE — Consult Note (Signed)
NAMEDEION, SWIFT NO.:  [id number]  MEDICAL RECORD NO.:  [id number]  LOCATION:  A303                          FACILITY:  [facility]  PHYSICIAN:  Ky Barban, M.D.DATE OF BIRTH:  12/05/1955  DATE OF CONSULTATION: DATE OF DISCHARGE:                                CONSULTATION   CHIEF COMPLAINT:  Fever, chills, and difficulty to void, urinary retention.  HISTORY:  A 57 year old gentleman, he was here a couple of months ago with fracture of his left hip.  He has history of having alcohol abuse. He fell and broke his left hip.  Couple of months ago, he was here in the hospital with a fractured left hip.  He was taken care of by Dr. Darreld Mclean, operated on his hip, fixed, and at that time, I also saw him, and I scheduled a cystoscopy because he was having symptoms of prostatism, but his cystoscope was canceled because his blood pressure was very high on that day.  Today, he tells me that he does not have any high blood pressure, and it is running fine, but it was canceled on that day because of his blood pressure.  He has a history of having hypertension.  On that day, his blood pressure was way up, and he says I have seen him since then in the office.  I have examined him, scheduled him to have cystoscopy, but he comes here with fever, chills, dysuria, lower abdominal pain.  He was found to be in urinary retention.  A Foley catheter was inserted.  A 1200 mL of urine removed.  He is feeling much better today.  His main complaint is that he complains of dysuria and frequency and his temperature on admission was 102.7.  With significant leukocytosis, he was admitted and I am called in to see him again.  He has a Foley catheter now, so I have suggested that we work him up further.  PAST MEDICAL HISTORY:  He has history of having depression, elevated PSA, arthritis, hyperlipidemia, cataract, EtOH abuse, COPD, asthma, allergies, hypertension, GERD.  PAST  SURGICAL HISTORY:  Left hip repair couple of months ago.  Spine surgery in the lumbar region.  He had a colonoscopy done on October 20, 2012, which is basically okay except diverticula.  Open reduction internal fixation of left hip on November 24, 2012.  MEDICATIONS:  He takes Norvasc 10 mg p.o. daily, ferrous sulfate 325 mg tablet daily, lorazepam 1 mg by mouth every 6 hours as needed for anxiety, multiple vitamins, Prilosec, and Zoloft, albuterol.  FAMILY HISTORY:  No history of prostate cancer.  SOCIAL HISTORY:  He has been smoking a cigar.  He does not have any smokeless tobacco history.  He reports that he drinks about 18 ounces of alcohol per week.  Does not use any illicit drugs.  PHYSICAL EXAMINATION:  ABDOMEN:  Soft, flat.  Liver, spleen, and kidneys are not palpable. GENITOURINARY:  External genitalia is uncircumcised.  Foley catheter in place, draining amber-colored urine. RECTAL : Normal sphincter tone.  No rectal mass.  Prostate; 1-1/2 plus, smooth and firm.  I have done the rectal exam in the office. VITAL SIGNS:  His blood pressure is 120/76, pulse 116 per minute, temperature 102.7 on admission.  Today, his temperature is 98, pulse 103 per minute, and respirations 20 per minute, blood pressure 107/69, O2 saturation is 99% on room air.  LABORATORY DATA:  His WBC count was around 32,000 on admission, today it is down to 26,000.  He had a lab workup.  He had CT of the abdomen and pelvis with contrast, which was done on October 12, 2012.  It shows 1-2 mm nonobstructing stone in the lower pole of the left kidney.  Mild prostatomegaly.  His urine culture on January 28, 2013 was negative, but Gram-stain showed gram-negative rods.  But after 24 hours, there was no growth.  I recommend please continue using his Rocephin antibiotic.  I will cystoscope him under local anesthesia on Monday to see what is the problem.     Ky Barban, M.D.     MIJ/MEDQ  D:  01/29/2013   T:  01/30/2013  Job:  [id number]

## 2013-01-30 NOTE — Progress Notes (Signed)
01/30/13 1154 Late entry for 01/29/13 at 1410. Lab result received for positive blood culture, gram negative rods. Notified Dr. Jerral Ralph. Stated okay, was aware. No new orders received. Earnstine Regal, RN

## 2013-01-30 NOTE — Progress Notes (Signed)
Pt refuses Heparin injection. He states that he is tired of being stuck.

## 2013-01-30 NOTE — Progress Notes (Signed)
PATIENT DETAILS Name: Duane Richardson Age: 57 y.o. Sex: male Date of Birth: 07/24/1955 Admit Date: 01/28/2013 Admitting Physician Dewayne Shorter Levora Dredge, MD BJY:[id number],ZHYQMV TOM, MD  Subjective: Feels much better. Denies any major complaints.  Assessment/Plan: Principal Problem:   SIRS (systemic inflammatory response syndrome) -SIRS (systemic inflammatory response syndrome)  - Secondary to UTI and gram neg bacteremia. Continue to treat with IV fluids, antibiotics. Await culture data.  Active Problems: Gram-negative bacteremia - Likely from UTI. - Continue Rocephin - Await final culture sensitivity. - Afebrile, leukocytosis decreasing, WBC down to 26.1  Culture data 1. Blood culture on 12/5: Gram-negative rods  Antibiotics Rocephin 12/5>>  UTI (lower urinary tract infection)  - Suspect acute urinary retention/BPH playing a major role here - Continue Rocephin, await further culture data.  Culture data 1. Urine culture on 12/5: Gram-negative rods  Antibiotics Rocephin 12/5>>  Acute renal failure  - Suspect this is obstructive uropathy  - Resolved with placement of foley and hydration - Monitor creatinine periodically  Acute urinary retention - On admission, was found to have acute urinary retention-Foley catheter was placed and 1.2 L of urine was drained. - Continue Flomax, appreciate  urology evaluation, plans are for cystoscopy on 12/8 - Suspected BPH as the underlying etiology.  DEPRESSION  - Stable, continue Zoloft   . ETOH abuse  - Claims not to have had any EtOH use in the past 90 days. He recently got out of the hospital only 3 days ago, he claims he has chronic tremors. Will continue with home dose of Ativan. We'll watch for any signs of withdrawal.   . GERD  - Continue PPI  . History of chronic tremors - Stable  Disposition: Remain inpatient  DVT Prophylaxis: Prophylactic Heparin  Code Status: Full code  Family Communication None at  bedside  Procedures:  None  CONSULTS:  urology  Time spent 35 minutes  MEDICATIONS: Scheduled Meds: . cefTRIAXone (ROCEPHIN)  IV  2 g Intravenous Q24H  . feeding supplement (ENSURE COMPLETE)  237 mL Oral BID BM  . ferrous sulfate  325 mg Oral Q breakfast  . heparin  5,000 Units Subcutaneous Q8H  . pantoprazole  40 mg Oral Daily  . sertraline  100 mg Oral BID  . sodium chloride  3 mL Intravenous Q12H  . tamsulosin  0.4 mg Oral Daily   Continuous Infusions: . sodium chloride 75 mL/hr (01/30/13 0958)   PRN Meds:.acetaminophen, acetaminophen, albuterol, alum & mag hydroxide-simeth, LORazepam, ondansetron (ZOFRAN) IV, ondansetron, oxyCODONE, polyethylene glycol  Antibiotics: Anti-infectives   Start     Dose/Rate Route Frequency Ordered Stop   01/30/13 1800  cefTRIAXone (ROCEPHIN) 2 g in dextrose 5 % 50 mL IVPB     2 g 100 mL/hr over 30 Minutes Intravenous Every 24 hours 01/30/13 0737     01/29/13 1800  cefTRIAXone (ROCEPHIN) 1 g in dextrose 5 % 50 mL IVPB  Status:  Discontinued     1 g 100 mL/hr over 30 Minutes Intravenous Every 24 hours 01/28/13 2206 01/30/13 0737   01/28/13 1615  cefTRIAXone (ROCEPHIN) 1 g in dextrose 5 % 50 mL IVPB     1 g 100 mL/hr over 30 Minutes Intravenous  Once 01/28/13 1611 01/28/13 1915       PHYSICAL EXAM: Vital signs in last 24 hours: Filed Vitals:   01/28/13 2323 01/29/13 0620 01/29/13 1454 01/30/13 0638  BP:  107/69 114/72 132/74  Pulse: 75 103 94 96  Temp:  98 F (36.7 C) 98.4  F (36.9 C) 99.8 F (37.7 C)  TempSrc:   Oral   Resp: 20 20 20 20   Height:      Weight:      SpO2: 95% 99% 98% 98%    Weight change:  Filed Weights   01/28/13 1353 01/28/13 2134  Weight: 64.864 kg (143 lb) 57.8 kg (127 lb 6.8 oz)   Body mass index is 16.35 kg/(m^2).   Gen Exam: Awake and alert with clear speech.   Neck: Supple, No JVD.   Chest: B/L Clear.  No rales or rhonchi. CVS: S1 S2 Regular, no murmurs.  Abdomen: soft, BS +, non tender, non  distended.  Extremities: no edema, lower extremities warm to touch. Neurologic: Non Focal.  From is essentially unchanged. Skin: No Rash.   Wounds: N/A.    Intake/Output from previous day:  Intake/Output Summary (Last 24 hours) at 01/30/13 1047 Last data filed at 01/30/13 0500  Gross per 24 hour  Intake    600 ml  Output   4100 ml  Net  -3500 ml     LAB RESULTS: CBC  Recent Labs Lab 01/28/13 1450 01/29/13 0543 01/30/13 0542  WBC 32.0* 26.1* 21.6*  HGB 11.8* 10.5* 9.5*  HCT 35.0* 31.8* 27.5*  PLT 411* 388 381  MCV 86.8 86.6 86.2  MCH 29.3 28.6 29.8  MCHC 33.7 33.0 34.5  RDW 14.9 14.6 15.0  LYMPHSABS 1.6  --   --   MONOABS 3.8*  --   --   EOSABS 0.0  --   --   BASOSABS 0.0  --   --     Chemistries   Recent Labs Lab 01/28/13 1450 01/29/13 0543 01/30/13 0542  NA 130* 132* 134*  K 4.3 3.6 3.4*  CL 92* 100 101  CO2 26 20 20   GLUCOSE 120* 169* 101*  BUN 36* 31* 21  CREATININE 1.81* 1.41* 1.21  CALCIUM 10.1 8.6 8.7    CBG: No results found for this basename: GLUCAP,  in the last 168 hours  GFR Estimated Creatinine Clearance: 53.7 ml/min (by C-G formula based on Cr of 1.21).  Coagulation profile No results found for this basename: INR, PROTIME,  in the last 168 hours  Cardiac Enzymes No results found for this basename: CK, CKMB, TROPONINI, MYOGLOBIN,  in the last 168 hours  No components found with this basename: POCBNP,  No results found for this basename: DDIMER,  in the last 72 hours No results found for this basename: HGBA1C,  in the last 72 hours No results found for this basename: CHOL, HDL, LDLCALC, TRIG, CHOLHDL, LDLDIRECT,  in the last 72 hours No results found for this basename: TSH, T4TOTAL, FREET3, T3FREE, THYROIDAB,  in the last 72 hours No results found for this basename: VITAMINB12, FOLATE, FERRITIN, TIBC, IRON, RETICCTPCT,  in the last 72 hours No results found for this basename: LIPASE, AMYLASE,  in the last 72 hours  Urine  Studies No results found for this basename: UACOL, UAPR, USPG, UPH, UTP, UGL, UKET, UBIL, UHGB, UNIT, UROB, ULEU, UEPI, UWBC, URBC, UBAC, CAST, CRYS, UCOM, BILUA,  in the last 72 hours  MICROBIOLOGY: Recent Results (from the past 240 hour(s))  URINE CULTURE     Status: None   Collection Time    01/28/13  2:35 PM      Result Value Range Status   Specimen Description URINE, CLEAN CATCH   Final   Special Requests NONE   Final   Culture  Setup Time  Final   Value: 01/28/2013 23:56     Performed at [facility] Count     Final   Value: >=100,000 COLONIES/ML     Performed at [facility]   Culture     Final   Value: GRAM NEGATIVE RODS     Performed at [facility]   Report Status PENDING   Incomplete  CULTURE, BLOOD (ROUTINE X 2)     Status: None   Collection Time    01/28/13  5:40 PM      Result Value Range Status   Specimen Description RIGHT ANTECUBITAL   Final   Special Requests BOTTLES DRAWN AEROBIC AND ANAEROBIC 10CC EACH   Final   Culture  Setup Time     Final   Value: 01/28/2013 14:00     Performed at [facility]   Culture     Final   Value: GRAM NEGATIVE RODS     Note: Gram Stain Report Called to,Read Back By and Verified With: A ROGERS @ [id number]     Performed at [facility]   Report Status PENDING   Incomplete  CULTURE, BLOOD (ROUTINE X 2)     Status: None   Collection Time    01/28/13  5:45 PM      Result Value Range Status   Specimen Description BLOOD RIGHT HAND   Final   Special Requests BOTTLES DRAWN AEROBIC AND ANAEROBIC 10CC EACH   Final   Culture  Setup Time     Final   Value: 01/29/2013 20:21     Performed at [facility]   Culture     Final   Value: GRAM NEGATIVE RODS     Note: Gram Stain Report Called to,Read Back By and Verified With: T Corine Shelter @ [phone]     Performed at [facility]   Report Status PENDING   Incomplete    RADIOLOGY STUDIES/RESULTS: Dg  Chest 1 View  01/28/2013   CLINICAL DATA:  Fever.  COPD.  Shortness of breath.  Asthma.  EXAM: CHEST - 1 VIEW  COMPARISON:  11/23/2012.  FINDINGS: Normal sized heart. Clear lungs. Lower thoracic spine degenerative changes. Old, healed right 6th rib fracture.  IMPRESSION: No acute abnormality.   Electronically Signed   By: Gordan Payment M.D.   On: 01/28/2013 15:51    Jeoffrey Massed, MD  [facility] Pager:[phone]  If 7PM-7AM, please contact night-coverage [email or web address] Password [email or web address] 01/30/2013, 10:47 AM   LOS: 2 days

## 2013-01-31 ENCOUNTER — Encounter (HOSPITAL_COMMUNITY)
Admission: EM | Disposition: A | Discharge status: Home Health | Payer: Self-pay | Source: Home / Self Care | Attending: Internal Medicine

## 2013-01-31 ENCOUNTER — Encounter (HOSPITAL_COMMUNITY): Payer: Self-pay | Admitting: *Deleted

## 2013-01-31 HISTORY — PX: CYSTOSCOPY: SHX5120

## 2013-01-31 LAB — URINE CULTURE: Colony Count: >=100000

## 2013-01-31 LAB — CULTURE, BLOOD (ROUTINE X 2)

## 2013-01-31 LAB — BASIC METABOLIC PANEL
BUN: 17 mg/dL (ref 6–23)
CO2: 21 mEq/L (ref 19–32)
Calcium: 8.8 mg/dL (ref 8.4–10.5)
Chloride: 100 mEq/L (ref 96–112)
Creatinine, Ser: 1.08 mg/dL (ref 0.50–1.35)
GFR calc Af Amer: 85 mL/min — ABNORMAL LOW (ref >90)
GFR calc non Af Amer: 73 mL/min — ABNORMAL LOW (ref >90)
Glucose, Bld: 148 mg/dL — ABNORMAL HIGH (ref 70–99)
Potassium: 3.6 mEq/L (ref 3.5–5.1)
Sodium: 135 mEq/L (ref 135–145)

## 2013-01-31 LAB — CBC
HCT: 29.3 % — ABNORMAL LOW (ref 39.0–52.0)
Hemoglobin: 9.7 g/dL — ABNORMAL LOW (ref 13.0–17.0)
MCH: 28.8 pg (ref 26.0–34.0)
MCHC: 33.1 g/dL (ref 30.0–36.0)
MCV: 86.9 fL (ref 78.0–100.0)
Platelets: 382 10*3/uL (ref 150–400)
RBC: 3.37 MIL/uL — ABNORMAL LOW (ref 4.22–5.81)
RDW: 15 % (ref 11.5–15.5)
WBC: 15.4 10*3/uL — ABNORMAL HIGH (ref 4.0–10.5)

## 2013-01-31 LAB — MRSA PCR SCREENING: MRSA by PCR: NEGATIVE

## 2013-01-31 LAB — PREPARE RBC (CROSSMATCH)

## 2013-01-31 SURGERY — CYSTOSCOPY, FLEXIBLE
Anesthesia: LOCAL | Site: Bladder

## 2013-01-31 MED ORDER — SODIUM CHLORIDE 0.9 % IV SOLN
INTRAVENOUS | Status: DC
  Administered 2013-01-31: 50 mL via INTRAVENOUS
  Administered 2013-02-01: 11:00:00 via INTRAVENOUS

## 2013-01-31 MED ORDER — LIDOCAINE HCL 2 % EX GEL
CUTANEOUS | Status: DC | PRN
  Administered 2013-01-31: 1 via URETHRAL

## 2013-01-31 MED ORDER — SODIUM CHLORIDE 0.9 % IR SOLN
Status: DC | PRN
  Administered 2013-01-31: 1000 mL

## 2013-01-31 MED ORDER — LIDOCAINE HCL 2 % EX GEL
CUTANEOUS | Status: AC
  Filled 2013-01-31: qty 10

## 2013-01-31 SURGICAL SUPPLY — 20 items
CLOTH BEACON ORANGE TIMEOUT ST (SAFETY) ×2 IMPLANT
DRAPE LAPAROTOMY TRNSV 102X78 (DRAPE) ×2 IMPLANT
DRAPE PROXIMA HALF (DRAPES) ×2 IMPLANT
GLOVE BIO SURGEON STRL SZ7 (GLOVE) ×2 IMPLANT
GLOVE BIO SURGEON STRL SZ8 (GLOVE) ×1 IMPLANT
GLOVE BIOGEL PI IND STRL 7.0 (GLOVE) IMPLANT
GLOVE BIOGEL PI IND STRL 8 (GLOVE) IMPLANT
GLOVE BIOGEL PI INDICATOR 7.0 (GLOVE) ×2
GLOVE BIOGEL PI INDICATOR 8 (GLOVE) ×1
GOWN STRL REIN XL XLG (GOWN DISPOSABLE) ×4 IMPLANT
IV NS 1000ML (IV SOLUTION) ×2
IV NS 1000ML BAXH (IV SOLUTION) IMPLANT
MARKER SKIN DUAL TIP RULER LAB (MISCELLANEOUS) ×2 IMPLANT
SET IRRIG Y TYPE TUR BLADDER L (SET/KITS/TRAYS/PACK) ×2 IMPLANT
SPONGE GAUZE 4X4 12PLY (GAUZE/BANDAGES/DRESSINGS) ×2 IMPLANT
TOWEL OR 17X26 4PK STRL BLUE (TOWEL DISPOSABLE) ×2 IMPLANT
TRAY FOLEY CATH 16FR SILVER (SET/KITS/TRAYS/PACK) ×2 IMPLANT
VALVE DISPOSABLE (MISCELLANEOUS) ×2 IMPLANT
WATER STERILE IRR 1000ML POUR (IV SOLUTION) ×2 IMPLANT
YANKAUER SUCT BULB TIP 10FT TU (MISCELLANEOUS) ×2 IMPLANT

## 2013-01-31 NOTE — Progress Notes (Signed)
Physical Therapy Evaluation Patient Details Name: Duane Richardson MRN: [id number] DOB: [birth year] Today's Date: 01/31/2013 Time: 4098-1191 PT Time Calculation (min): 26 min  PT Assessment / Plan / Recommendation History of Present Illness  Pt was admitted with Sirs, probably secondary to a UTI.  He sustained a right hip fx in October 2014 and was in SNF up until last week.  He was home with his brother for a few days before coming back to the hospital.  He states that he was able to ambulate with a walker or cane after leaving SNF, but he is currently severely deconditioned and I question his report.  Clinical Impression   Pt was seen for evaluation.  He is found to have significant limitation of mobility due to generalized weakness/deconditioning.  At this time, he is unable to ambulate.  He refuses to return to SNF, so we will need to resume HHPT at d/c.  He would benefit from having a w/c until gait becomes functional.  PT Assessment  Patient needs continued PT services    Follow Up Recommendations  Home health PT (pt refuses SNF)    Does the patient have the potential to tolerate intense rehabilitation    no  Barriers to Discharge        Equipment Recommendations  Wheelchair (measurements PT)    Recommendations for Other Services     Frequency Min 3X/week    Precautions / Restrictions Precautions Precautions: Fall Restrictions Weight Bearing Restrictions: No   Pertinent Vitals/Pain       Mobility  Bed Mobility Bed Mobility: Supine to Sit Supine to Sit: 4: Min assist Transfers Transfers: Sit to Stand;Stand to Sit;Stand Pivot Transfers Sit to Stand: 4: Min assist;From bed;With upper extremity assist Stand to Sit: 4: Min assist;With upper extremity assist;To chair/3-in-1 Stand Pivot Transfers: 4: Min assist Details for Transfer Assistance: pt used a walker to pivot bed to chair Ambulation/Gait Ambulation/Gait Assistance: Not tested (comment) (unable)    Exercises  General Exercises - Lower Extremity Ankle Circles/Pumps: AROM;AAROM;Both;10 reps;Supine Short Arc Quad: AAROM;Both;10 reps;Supine Heel Slides: AAROM;Both;10 reps;Supine Hip ABduction/ADduction: AAROM;Both;10 reps;Supine   PT Diagnosis: Difficulty walking;Generalized weakness  PT Problem List: Decreased strength;Decreased activity tolerance;Decreased mobility;Decreased safety awareness PT Treatment Interventions: DME instruction;Therapeutic activities;Therapeutic exercise     PT Goals(Current goals can be found in the care plan section) Acute Rehab PT Goals Patient Stated Goal: none stated PT Goal Formulation: With patient Time For Goal Achievement: 02/14/13 Potential to Achieve Goals: Fair  Visit Information  Last PT Received On: 01/31/13 History of Present Illness: Pt was admitted with Sirs, probably secondary to a UTI.  He sustained a right hip fx in October 2014 and was in SNF up until last week.  He was home with his brother for a few days before coming back to the hospital.  He states that he was able to ambulate with a walker or cane after leaving SNF, but he is currently severely deconditioned and I question his report.       Prior Functioning  Home Living Family/patient expects to be discharged to:: Private residence Living Arrangements: Other relatives Available Help at Discharge: Family;Available 24 hours/day Type of Home: House Home Access: Stairs to enter Home Layout: One level Home Equipment: Environmental consultant - 2 wheels;Cane - single point Prior Function Level of Independence:  (not really known) Communication Communication: No difficulties    Cognition  Cognition Arousal/Alertness: Awake/alert Behavior During Therapy: WFL for tasks assessed/performed Overall Cognitive Status: Within Functional Limits for tasks assessed  Extremity/Trunk Assessment Lower Extremity Assessment Lower Extremity Assessment: Generalized weakness   Balance Balance Balance Assessed: No   End of Session PT - End of Session Equipment Utilized During Treatment: Gait belt Activity Tolerance: Patient tolerated treatment well Patient left: in chair;with call bell/phone within reach  GP     Myrlene Broker L 01/31/2013, 12:26 PM

## 2013-01-31 NOTE — Progress Notes (Signed)
UR chart review completed.  

## 2013-01-31 NOTE — Brief Op Note (Signed)
01/28/2013 - 01/31/2013  12:08 PM  PATIENT:  Duane Richardson  57 y.o. male  PRE-OPERATIVE DIAGNOSIS:  acute urinary retention  POST-OPERATIVE DIAGNOSIS:  acute urinary retention, bph  PROCEDURE:  Procedure(s) with comments: CYSTOSCOPY FLEXIBLE (N/A) - I would like to do this around 1 pm on monday.   SURGEON:  Surgeon(s) and Role:    * Ky Barban, MD - Primary  PHYSICIAN ASSISTANT:   ASSISTANTS: none   ANESTHESIA:   local  EBL:  Total I/O In: 120 [P.O.:120] Out: 1650 [Urine:1650]  BLOOD ADMINISTERED:none  DRAINS: Urinary Catheter (Foley)   LOCAL MEDICATIONS USED:  NONE  SPECIMEN:  No Specimen  DISPOSITION OF SPECIMEN:  N/A  COUNTS:  YES  TOURNIQUET:  * No tourniquets in log *  DICTATION: .Other Dictation: Dictation Number dictation [id number]  PLAN OF CARE: Admit for overnight observation  PATIENT DISPOSITION:  PACU - hemodynamically stable.   Delay start of Pharmacological VTE agent (>24hrs) due to surgical blood loss or risk of bleeding: yes

## 2013-01-31 NOTE — Progress Notes (Signed)
He will need turp after medically cleared.

## 2013-01-31 NOTE — Care Management Note (Addendum)
    Page 1 of 2   02/03/2013     11:32:32 AM   CARE MANAGEMENT NOTE 02/03/2013  Patient:  Duane Richardson, Duane Richardson   Account Number:  [id number]  Date Initiated:  01/31/2013  Documentation initiated by:  Sharrie Rothman  Subjective/Objective Assessment:   Pt admitted from home with SIRS. Pt lives with his mother and will return home at discharge. Pt is active with [facility] RN and PT. Pt will need moderate assistance with ADL's.     Action/Plan:   PT recommends w/c at discharge and will arrange with [facility]. W/c will be delivered to pts room prior to discharge. CM will resume HH with [facility] at discharge.   Anticipated DC Date:  02/02/2013   Anticipated DC Plan:  HOME W HOME HEALTH SERVICES      DC Planning Services  CM consult      [facility] Choice  Resumption Of Svcs/PTA Provider  DURABLE MEDICAL EQUIPMENT   Choice offered to / List presented to:  C-1 Patient   DME arranged  WALKER - ROLLING      DME agency  [facility]     HH arranged  HH-1 RN  HH-2 PT      HH agency  [facility]   Status of service:  Completed, signed off Medicare Important Message given?   (If response is "NO", the following Medicare IM given date fields will be blank) Date Medicare IM given:   Date Additional Medicare IM given:    Discharge Disposition:  HOME W HOME HEALTH SERVICES  Per UR Regulation:    If discussed at Long Length of Stay Meetings, dates discussed:    Comments:  02/03/13 1130 Arlyss Queen, RN BSN CM Pt discharged home today with resumption of [facility] Rna nd PT. Alroy Bailiff of [facility] is aware and will collect the pts information from the chart. HH services to start within 48 hours. Pt has rolling walker from [place] in room for discharge home. Pt and pts nurse aware of discharge arrangements.  01/31/13 1355 Arlyss Queen, RN BSN CM

## 2013-01-31 NOTE — Op Note (Signed)
Op note dictated [phone]

## 2013-01-31 NOTE — Progress Notes (Signed)
PATIENT DETAILS Name: Duane Richardson Age: 57 y.o. Sex: male Date of Birth: 10/14/1955 Admit Date: 01/28/2013 Admitting Physician Dewayne Shorter Levora Dredge, MD AVW:[id number] TOM, MD  Subjective: Feels much better. No major complaints.  Assessment/Plan: Principal Problem:   SIRS (systemic inflammatory response syndrome) -SIRS (systemic inflammatory response syndrome)  -resolved - Secondary to UTI and gram neg bacteremia. Treated with IV fluids, antibiotics. Await culture data.  Active Problems: [name] bacteremia - Likely from UTI. - Continue Rocephin - Afebrile, leukocytosis decreasing, WBC down to 15.4 from a peak of 32.0  Culture data 1. Blood culture on 12/5: [name]  Antibiotics Rocephin 12/5>>  [name] UTI (lower urinary tract infection)  - Suspect acute urinary retention/BPH playing a major role here - Continue Rocephin,  Culture data 1. Urine culture on 12/5: [name]  Antibiotics Rocephin 12/5>>  Acute renal failure  - Suspect this is obstructive uropathy  - Resolved with placement of foley and hydration - Monitor creatinine periodically  Acute urinary retention - On admission, was found to have acute urinary retention-Foley catheter was placed and 1.2 L of urine was drained. - Continue Flomax, appreciate  urology evaluation, plans are for cystoscopy on 12/8 - Suspected BPH as the underlying etiology.  DEPRESSION  - Stable, continue Zoloft   . ETOH abuse  - Claims not to have had any EtOH use in the past 90 days. He recently got out of the hospital only 3 days ago, he claims he has chronic tremors. Will continue with home dose of Ativan.No signs of withdrawal.   . GERD  - Continue PPI  . History of chronic tremors - Stable  Disposition: Remain inpatient-?SNF on discharge  DVT Prophylaxis: Prophylactic Heparin  Code Status: Full code  Family Communication None at  bedside  Procedures:  None  CONSULTS:  urology  Time spent 35 minutes  MEDICATIONS: Scheduled Meds: . cefTRIAXone (ROCEPHIN)  IV  2 g Intravenous Q24H  . feeding supplement (ENSURE COMPLETE)  237 mL Oral BID BM  . ferrous sulfate  325 mg Oral Q breakfast  . heparin  5,000 Units Subcutaneous Q8H  . pantoprazole  40 mg Oral Daily  . sertraline  100 mg Oral BID  . sodium chloride  3 mL Intravenous Q12H  . tamsulosin  0.4 mg Oral Daily   Continuous Infusions: . sodium chloride 75 mL/hr at 01/30/13 2159   PRN Meds:.acetaminophen, acetaminophen, albuterol, alum & mag hydroxide-simeth, LORazepam, ondansetron (ZOFRAN) IV, ondansetron, oxyCODONE, polyethylene glycol  Antibiotics: Anti-infectives   Start     Dose/Rate Route Frequency Ordered Stop   01/30/13 1800  cefTRIAXone (ROCEPHIN) 2 g in dextrose 5 % 50 mL IVPB     2 g 100 mL/hr over 30 Minutes Intravenous Every 24 hours 01/30/13 0737     01/29/13 1800  cefTRIAXone (ROCEPHIN) 1 g in dextrose 5 % 50 mL IVPB  Status:  Discontinued     1 g 100 mL/hr over 30 Minutes Intravenous Every 24 hours 01/28/13 2206 01/30/13 0737   01/28/13 1615  cefTRIAXone (ROCEPHIN) 1 g in dextrose 5 % 50 mL IVPB     1 g 100 mL/hr over 30 Minutes Intravenous  Once 01/28/13 1611 01/28/13 1915       PHYSICAL EXAM: Vital signs in last 24 hours: Filed Vitals:   01/29/13 1454 01/30/13 0638 01/30/13 1500 01/30/13 2219  BP: 114/72 132/74 117/77 132/78  Pulse: 94 96 86 83  Temp: 98.4 F (36.9 C) 99.8 F (37.7 C) 98.4 F (  36.9 C) 98.8 F (37.1 C)  TempSrc: Oral  Oral   Resp: 20 20 20 18   Height:      Weight:      SpO2: 98% 98% 100% 100%    Weight change:  Filed Weights   01/28/13 1353 01/28/13 2134  Weight: 64.864 kg (143 lb) 57.8 kg (127 lb 6.8 oz)   Body mass index is 16.35 kg/(m^2).   Gen Exam: Awake and alert with clear speech.  Unkept Neck: Supple, No JVD.   Chest: B/L Clear.  No rales or rhonchi. CVS: S1 S2 Regular, no murmurs.  Bladder not palpable Abdomen: soft, BS +, non tender, non distended.  Extremities: no edema, lower extremities warm to touch. Neurologic: Non Focal.  From is essentially unchanged. Skin: No Rash.   Wounds: N/A.    Intake/Output from previous day:  Intake/Output Summary (Last 24 hours) at 01/31/13 0843 Last data filed at 01/31/13 0753  Gross per 24 hour  Intake 6368.33 ml  Output   3000 ml  Net 3368.33 ml     LAB RESULTS: CBC  Recent Labs Lab 01/28/13 1450 01/29/13 0543 01/30/13 0542 01/31/13 0454  WBC 32.0* 26.1* 21.6* 15.4*  HGB 11.8* 10.5* 9.5* 9.7*  HCT 35.0* 31.8* 27.5* 29.3*  PLT 411* 388 381 382  MCV 86.8 86.6 86.2 86.9  MCH 29.3 28.6 29.8 28.8  MCHC 33.7 33.0 34.5 33.1  RDW 14.9 14.6 15.0 15.0  LYMPHSABS 1.6  --   --   --   MONOABS 3.8*  --   --   --   EOSABS 0.0  --   --   --   BASOSABS 0.0  --   --   --     Chemistries   Recent Labs Lab 01/28/13 1450 01/29/13 0543 01/30/13 0542 01/31/13 0454  NA 130* 132* 134* 135  K 4.3 3.6 3.4* 3.6  CL 92* 100 101 100  CO2 26 20 20 21   GLUCOSE 120* 169* 101* 148*  BUN 36* 31* 21 17  CREATININE 1.81* 1.41* 1.21 1.08  CALCIUM 10.1 8.6 8.7 8.8    CBG: No results found for this basename: GLUCAP,  in the last 168 hours  GFR Estimated Creatinine Clearance: 60.2 ml/min (by C-G formula based on Cr of 1.08).  Coagulation profile No results found for this basename: INR, PROTIME,  in the last 168 hours  Cardiac Enzymes No results found for this basename: CK, CKMB, TROPONINI, MYOGLOBIN,  in the last 168 hours  No components found with this basename: POCBNP,  No results found for this basename: DDIMER,  in the last 72 hours No results found for this basename: HGBA1C,  in the last 72 hours No results found for this basename: CHOL, HDL, LDLCALC, TRIG, CHOLHDL, LDLDIRECT,  in the last 72 hours No results found for this basename: TSH, T4TOTAL, FREET3, T3FREE, THYROIDAB,  in the last 72 hours No results found for  this basename: VITAMINB12, FOLATE, FERRITIN, TIBC, IRON, RETICCTPCT,  in the last 72 hours No results found for this basename: LIPASE, AMYLASE,  in the last 72 hours  Urine Studies No results found for this basename: UACOL, UAPR, USPG, UPH, UTP, UGL, UKET, UBIL, UHGB, UNIT, UROB, ULEU, UEPI, UWBC, URBC, UBAC, CAST, CRYS, UCOM, BILUA,  in the last 72 hours  MICROBIOLOGY: Recent Results (from the past 240 hour(s))  URINE CULTURE     Status: None   Collection Time    01/28/13  2:35 PM      Result  Value Range Status   Specimen Description URINE, CLEAN CATCH   Final   Special Requests NONE   Final   Culture  Setup Time     Final   Value: 01/28/2013 23:56     Performed at [facility]   Colony Count     Final   Value: >=100,000 COLONIES/ML     Performed at [facility]   Culture     Final   Value: [facility] MORGANII     Performed at [facility]   Report Status 01/31/2013 FINAL   Final   Organism ID, Bacteria [name]   Final  CULTURE, BLOOD (ROUTINE X 2)     Status: None   Collection Time    01/28/13  5:40 PM      Result Value Range Status   Specimen Description RIGHT ANTECUBITAL   Final   Special Requests BOTTLES DRAWN AEROBIC AND ANAEROBIC 10CC EACH   Final   Culture  Setup Time     Final   Value: 01/28/2013 14:00     Performed at [facility]   Culture     Final   Value: [facility] MORGANII     Note: Gram Stain Report Called to,Read Back By and Verified With: A ROGERS @ [id number] [address]man     Performed at [facility]   Report Status 01/31/2013 FINAL   Final   Organism ID, Bacteria [name]   Final  CULTURE, BLOOD (ROUTINE X 2)     Status: None   Collection Time    01/28/13  5:45 PM      Result Value Range Status   Specimen Description BLOOD RIGHT HAND   Final   Special Requests BOTTLES DRAWN AEROBIC AND ANAEROBIC 10CC EACH   Final   Culture  Setup Time     Final   Value: 01/29/2013 20:21     Performed at  [facility]   Culture     Final   Value: GRAM NEGATIVE RODS     Note: Gram Stain Report Called to,Read Back By and Verified With: T Corine Shelter @ [id number]     Performed at [facility]   Report Status PENDING   Incomplete  MRSA PCR SCREENING     Status: None   Collection Time    01/30/13 11:58 PM      Result Value Range Status   MRSA by PCR NEGATIVE  NEGATIVE Final   Comment:            The GeneXpert MRSA Assay (FDA     approved for NASAL specimens     only), is one component of a     comprehensive MRSA colonization     surveillance program. It is not     intended to diagnose MRSA     infection nor to guide or     monitor treatment for     MRSA infections.    RADIOLOGY STUDIES/RESULTS: Dg Chest 1 View  01/28/2013   CLINICAL DATA:  Fever.  COPD.  Shortness of breath.  Asthma.  EXAM: CHEST - 1 VIEW  COMPARISON:  11/23/2012.  FINDINGS: Normal sized heart. Clear lungs. Lower thoracic spine degenerative changes. Old, healed right 6th rib fracture.  IMPRESSION: No acute abnormality.   Electronically Signed   By: Gordan Payment M.D.   On: 01/28/2013 15:51    Jeoffrey Massed, MD  [facility] Pager:[phone]  If 7PM-7AM, please contact night-coverage [email or web address]  Password [email or web address] 01/31/2013, 8:43 AM   LOS: 3 days

## 2013-02-01 ENCOUNTER — Encounter (HOSPITAL_COMMUNITY)
Admission: EM | Disposition: A | Discharge status: Home Health | Payer: Self-pay | Source: Home / Self Care | Attending: Internal Medicine

## 2013-02-01 ENCOUNTER — Encounter (HOSPITAL_COMMUNITY): Payer: Self-pay | Admitting: Urology

## 2013-02-01 LAB — CBC
HCT: 29.3 % — ABNORMAL LOW (ref 39.0–52.0)
Hemoglobin: 9.8 g/dL — ABNORMAL LOW (ref 13.0–17.0)
MCH: 28.9 pg (ref 26.0–34.0)
MCHC: 33.4 g/dL (ref 30.0–36.0)
MCV: 86.4 fL (ref 78.0–100.0)
Platelets: 366 10*3/uL (ref 150–400)
RBC: 3.39 MIL/uL — ABNORMAL LOW (ref 4.22–5.81)
RDW: 15.1 % (ref 11.5–15.5)
WBC: 19.2 10*3/uL — ABNORMAL HIGH (ref 4.0–10.5)

## 2013-02-01 SURGERY — TURP (TRANSURETHRAL RESECTION OF PROSTATE)
Anesthesia: Choice

## 2013-02-01 MED ORDER — CIPROFLOXACIN HCL 500 MG PO TABS: 500.0000 mg | ORAL_TABLET | Freq: Two times a day (BID) | ORAL | Status: DC

## 2013-02-01 MED ORDER — TAMSULOSIN HCL 0.4 MG PO CAPS: 0.4000 mg | ORAL_CAPSULE | Freq: Every day | ORAL | Status: DC

## 2013-02-01 MED ORDER — ENSURE COMPLETE PO LIQD: 237.0000 mL | Freq: Two times a day (BID) | ORAL | Status: DC

## 2013-02-01 NOTE — Progress Notes (Addendum)
PATIENT DETAILS Name: Duane Richardson Age: 57 y.o. Sex: male Date of Birth: 1956-01-01 Admit Date: 01/28/2013 Admitting Physician Dewayne Shorter Levora Dredge, MD WUJ:[id number] TOM, MD  Brief summary Patient is a 57 year old African American male history of alcohol abuse, who was discharged from this facility to a SNF on October 2014 following a hip fracture. While at SNF, patient had a Foley catheter that was removed a few days prior to his discharge from SNF . He was discharged from SNF on 01/24/13. Since then patient has been having symptoms of fever, and prostatism-dribbling, hesitancy, urgency and poor stream. He subsequently presented to the ED on 01/28/13 with high-grade fevers, and on exam was found to have a distended bladder. He was then diagnosed with acute obstructive uropathy with renal failure and UTI. Blood and urine cultures are positive for [name]. Patient continues to be on IV Rocephin, Urology has been consulted and they have recommended inpatient TURP prior to discharge.  Subjective: Feels much better. No major complaints.  Assessment/Plan: Principal Problem:   SIRS (systemic inflammatory response syndrome) -SIRS (systemic inflammatory response syndrome)  -resolved - Secondary to Morganella UTI and bacteremia. Treated with IV fluids, antibiotics. A  Active Problems: [name] bacteremia - Likely from UTI. - Continue Rocephin - Afebrile, leukocytosis decreasing, WBC downtrending from a peak of 32.0 on admission - Repeat blood cultures on 12/8 currently pending - Spoke with Dr. Bertram Millard disease over the phone, agrees with total of 2 weeks of antibiotic therapy.  Culture data 1. Blood culture on 12/5: [name] 2. Blood culture on 12/8: Pending  Antibiotics Rocephin 12/5>>  [name] UTI (lower urinary tract infection)  - Suspect acute urinary retention/BPH playing a major role here - Continue Rocephin,  Culture  data 1. Urine culture on 12/5: [name]  Antibiotics Rocephin 12/5>>  Acute renal failure  - Suspect this is obstructive uropathy  - Resolved with placement of foley and hydration - Monitor creatinine periodically  Acute urinary retention - On admission, was found to have acute urinary retention-Foley catheter was placed and 1.2 L of urine was drained. - Etiology thought to be secondary to BPH - Continue Flomax, urology consulted, patient underwent for cystoscopy on 12/8, subsequent recommendations from Dr. Renda Rolls to proceed with a TURP on 12/9.  Procedures Cystoscopy-12/8 TURP-12/9 (pending)  DEPRESSION  - Stable, continue Zoloft   . ETOH abuse  - Claims not to have had any EtOH use in the past 90 days. He recently got out of the hospital only 3 days prior to admission, he claims he has chronic tremors. Will continue with home dose of Ativan.No signs of withdrawal.   . GERD  - Continue PPI  . History of chronic tremors - Stable  Disposition: Remain inpatient-refused SNF discharge. Plan to discharge home with home health services.  DVT Prophylaxis: Prophylactic Heparin  Code Status: Full code  Family Communication None at bedside  Procedures: Cystoscopy-12/8 TURP-12/9 (pending)  CONSULTS:  urology  Time spent 25 minutes  MEDICATIONS: Scheduled Meds: . cefTRIAXone (ROCEPHIN)  IV  2 g Intravenous Q24H  . feeding supplement (ENSURE COMPLETE)  237 mL Oral BID BM  . ferrous sulfate  325 mg Oral Q breakfast  . heparin  5,000 Units Subcutaneous Q8H  . pantoprazole  40 mg Oral Daily  . sertraline  100 mg Oral BID  . sodium chloride  3 mL Intravenous Q12H  . tamsulosin  0.4 mg Oral Daily   Continuous Infusions: . sodium chloride 50 mL (01/31/13  1441)   PRN Meds:.acetaminophen, acetaminophen, albuterol, alum & mag hydroxide-simeth, LORazepam, ondansetron (ZOFRAN) IV, ondansetron, oxyCODONE, polyethylene glycol  Antibiotics: Anti-infectives    Start     Dose/Rate Route Frequency Ordered Stop   01/30/13 1800  cefTRIAXone (ROCEPHIN) 2 g in dextrose 5 % 50 mL IVPB     2 g 100 mL/hr over 30 Minutes Intravenous Every 24 hours 01/30/13 0737     01/29/13 1800  cefTRIAXone (ROCEPHIN) 1 g in dextrose 5 % 50 mL IVPB  Status:  Discontinued     1 g 100 mL/hr over 30 Minutes Intravenous Every 24 hours 01/28/13 2206 01/30/13 0737   01/28/13 1615  cefTRIAXone (ROCEPHIN) 1 g in dextrose 5 % 50 mL IVPB     1 g 100 mL/hr over 30 Minutes Intravenous  Once 01/28/13 1611 01/28/13 1915       PHYSICAL EXAM: Vital signs in last 24 hours: Filed Vitals:   01/31/13 1207 01/31/13 1415 01/31/13 2124 02/01/13 0753  BP: 147/98 123/77 131/79 113/69  Pulse:  97 93 80  Temp:  98.7 F (37.1 C) 100.3 F (37.9 C) 99.2 F (37.3 C)  TempSrc:  Oral Oral Oral  Resp: 22 20 18 20   Height:      Weight:      SpO2: 98% 100% 97% 98%    Weight change:  Filed Weights   01/28/13 1353 01/28/13 2134  Weight: 64.864 kg (143 lb) 57.8 kg (127 lb 6.8 oz)   Body mass index is 16.35 kg/(m^2).   Gen Exam: Awake and alert with clear speech.   Neck: Supple, No JVD.  No meningeal sign Chest: B/L Clear.  No rales or rhonchi. CVS: S1 S2 Regular, no murmurs. Bladder not palpable Abdomen: soft, BS +, non tender, non distended.  Extremities: no edema, lower extremities warm to touch. Neurologic: Non Focal.  From is essentially unchanged. Skin: No Rash.   Wounds: N/A.    Intake/Output from previous day:  Intake/Output Summary (Last 24 hours) at 02/01/13 0931 Last data filed at 02/01/13 0800  Gross per 24 hour  Intake    600 ml  Output   3725 ml  Net  -3125 ml     LAB RESULTS: CBC  Recent Labs Lab 01/28/13 1450 01/29/13 0543 01/30/13 0542 01/31/13 0454 02/01/13 0504  WBC 32.0* 26.1* 21.6* 15.4* 19.2*  HGB 11.8* 10.5* 9.5* 9.7* 9.8*  HCT 35.0* 31.8* 27.5* 29.3* 29.3*  PLT 411* 388 381 382 366  MCV 86.8 86.6 86.2 86.9 86.4  MCH 29.3 28.6 29.8 28.8 28.9   MCHC 33.7 33.0 34.5 33.1 33.4  RDW 14.9 14.6 15.0 15.0 15.1  LYMPHSABS 1.6  --   --   --   --   MONOABS 3.8*  --   --   --   --   EOSABS 0.0  --   --   --   --   BASOSABS 0.0  --   --   --   --     Chemistries   Recent Labs Lab 01/28/13 1450 01/29/13 0543 01/30/13 0542 01/31/13 0454  NA 130* 132* 134* 135  K 4.3 3.6 3.4* 3.6  CL 92* 100 101 100  CO2 26 20 20 21   GLUCOSE 120* 169* 101* 148*  BUN 36* 31* 21 17  CREATININE 1.81* 1.41* 1.21 1.08  CALCIUM 10.1 8.6 8.7 8.8    CBG: No results found for this basename: GLUCAP,  in the last 168 hours  GFR Estimated Creatinine Clearance: 60.2  ml/min (by C-G formula based on Cr of 1.08).  Coagulation profile No results found for this basename: INR, PROTIME,  in the last 168 hours  Cardiac Enzymes No results found for this basename: CK, CKMB, TROPONINI, MYOGLOBIN,  in the last 168 hours  No components found with this basename: POCBNP,  No results found for this basename: DDIMER,  in the last 72 hours No results found for this basename: HGBA1C,  in the last 72 hours No results found for this basename: CHOL, HDL, LDLCALC, TRIG, CHOLHDL, LDLDIRECT,  in the last 72 hours No results found for this basename: TSH, T4TOTAL, FREET3, T3FREE, THYROIDAB,  in the last 72 hours No results found for this basename: VITAMINB12, FOLATE, FERRITIN, TIBC, IRON, RETICCTPCT,  in the last 72 hours No results found for this basename: LIPASE, AMYLASE,  in the last 72 hours  Urine Studies No results found for this basename: UACOL, UAPR, USPG, UPH, UTP, UGL, UKET, UBIL, UHGB, UNIT, UROB, ULEU, UEPI, UWBC, URBC, UBAC, CAST, CRYS, UCOM, BILUA,  in the last 72 hours  MICROBIOLOGY: Recent Results (from the past 240 hour(s))  URINE CULTURE     Status: None   Collection Time    01/28/13  2:35 PM      Result Value Range Status   Specimen Description URINE, CLEAN CATCH   Final   Special Requests NONE   Final   Culture  Setup Time     Final   Value:  01/28/2013 23:56     Performed at [facility] Count     Final   Value: >=100,000 COLONIES/ML     Performed at [facility]   Culture     Final   Value: [facility] MORGANII     Performed at [facility]   Report Status 01/31/2013 FINAL   Final   Organism ID, Bacteria [name]   Final  CULTURE, BLOOD (ROUTINE X 2)     Status: None   Collection Time    01/28/13  5:40 PM      Result Value Range Status   Specimen Description RIGHT ANTECUBITAL   Final   Special Requests BOTTLES DRAWN AEROBIC AND ANAEROBIC 10CC EACH   Final   Culture  Setup Time     Final   Value: 01/28/2013 14:00     Performed at [facility]   Culture     Final   Value: [facility] MORGANII     Note: Gram Stain Report Called to,Read Back By and Verified With: A ROGERS @ [id number] [address]     Performed at [facility]   Report Status 01/31/2013 FINAL   Final   Organism ID, Bacteria [name]   Final  CULTURE, BLOOD (ROUTINE X 2)     Status: None   Collection Time    01/28/13  5:45 PM      Result Value Range Status   Specimen Description BLOOD RIGHT HAND   Final   Special Requests BOTTLES DRAWN AEROBIC AND ANAEROBIC 10CC EACH   Final   Culture  Setup Time     Final   Value: 01/29/2013 20:21     Performed at [facility]   Culture     Final   Value: [name]     Note: SUSCEPTIBILITIES PERFORMED ON PREVIOUS CULTURE WITHIN THE LAST 5 DAYS. Gram Stain Report Called to,Read Back By and Verified With: T WATKINS @ [id number]  Performed at [facility]   Report Status 01/31/2013 FINAL   Final  MRSA PCR SCREENING     Status: None   Collection Time    01/30/13 11:58 PM      Result Value Range Status   MRSA by PCR NEGATIVE  NEGATIVE Final   Comment:            The GeneXpert MRSA Assay (FDA     approved for NASAL specimens     only), is one component of a     comprehensive MRSA colonization     surveillance  program. It is not     intended to diagnose MRSA     infection nor to guide or     monitor treatment for     MRSA infections.    RADIOLOGY STUDIES/RESULTS: Dg Chest 1 View  01/28/2013   CLINICAL DATA:  Fever.  COPD.  Shortness of breath.  Asthma.  EXAM: CHEST - 1 VIEW  COMPARISON:  11/23/2012.  FINDINGS: Normal sized heart. Clear lungs. Lower thoracic spine degenerative changes. Old, healed right 6th rib fracture.  IMPRESSION: No acute abnormality.   Electronically Signed   By: Gordan Payment M.D.   On: 01/28/2013 15:51    Jeoffrey Massed, MD  [facility] Pager:[phone]  If 7PM-7AM, please contact night-coverage [email or web address] Password [email or web address] 02/01/2013, 9:31 AM   LOS: 4 days

## 2013-02-01 NOTE — Progress Notes (Signed)
May be add cipro to rocephin and start today repeat cbc am.

## 2013-02-01 NOTE — Discharge Summary (Signed)
Physician Discharge Summary  Duane Richardson ZOX:[id number] DOB: March 01, 1955 DOA: 01/28/2013  PCP: Leo Grosser, MD  Admit date: 01/28/2013 Discharge date: 02/03/2013  Recommendations for Outpatient Follow-up:  1. Patient needs TURP procedure after completion of 2 weeks of antibiotics  2. Please continue with Foley catheter till seen and evaluated by Dr Jerre Simon in the outpatient setting. 3. Please repeat CBC/BMET at next visit 4. General health maintenance and age appropriate cancer screening  Follow-up Information   Follow up with [facility] TOM, MD. Schedule an appointment as soon as possible for a visit in 1 week.   Specialty:  Family Medicine   Contact information:   [address] [place] [address] [phone]       Follow up with [facility].   Contact information:   [address] [place] [address] [phone]      Follow up with Alleen Borne I, MD. Schedule an appointment as soon as possible for a visit in 1 week.   Specialty:  Urology   Contact information:   [address] [place] [address] [phone]      Discharge Diagnoses:  1. SIRS, suspected sepsis on admission 2. [name] bacteremia 3. [name] UTI 4. Acute renal failure 5. Acute urinary retention 6. Anemia, likely anemia of acute illness 7. History of alcohol abuse 8. GERD  Discharge Condition: Improved Disposition: Home with home health. PT, RN. Patient refused SNF  Diet recommendation: Regular  Filed Weights   01/28/13 1353 01/28/13 2134  Weight: 64.864 kg (143 lb) 57.8 kg (127 lb 6.8 oz)    History of present illness:  57 year old man with history of alcohol abuse, who was discharged from this facility to a SNF on October 2014 following a hip fracture. While at SNF, patient had a Foley catheter that was removed a few days prior to his discharge from SNF . He was discharged from SNF on 01/24/13. Since then patient has been  having symptoms of fever, and prostatism-dribbling, hesitancy, urgency and poor stream. He subsequently presented to the ED on 01/28/13 with high-grade fevers, and on exam was found to have a distended bladder. He was admitted for sepsis, UTI.  Hospital Course:  Patient was admitted for UTI with sepsis and treated with antibiotics. Subsequently bacteremia was discovered consistent with UTI infection. In discussion with ID, two-week course of antibiotics was recommended. His condition stabilized and he is now stable for discharge. He was seen by urology in consultation, patient will continue with Foley catheter and followup as an outpatient for TURP.  Plan:  1. SIRS, suspected sepsis on admission: Resolved. Secondary to UTI and bacteremia. 2. [name] bacteremia: Presumed secondary to UTI. Dr. Jerral Ralph discussed with Dr. Ilsa Iha, ID, recommended a total of 2 weeks of antibiotic therapy. Followup repeat blood cultures. 3. [name] UTI: Secondary to urinary retention, BPH. 4. Acute renal failure: Thought to be secondary to obstructive uropathy. Resolved with placement of Foley and hydration. 5. Acute urinary retention: Present on admission. Foley catheter drained 1.2 L of urine at that time. Likely secondary to BPH. Continue Flomax. Status post cystoscopy 12/8 per urology. TURP is planned in near future. Continue Foley catheter. 6. Anemia: Hemoglobin stable. Likely anemia of acute illness. Followup as an outpatient. 7. History of alcohol use: Stable. No signs of withdrawal. 8. GERD: Stable. Continue PPI. Complete Cipro as an outpatient  Continue Foley catheter  To followup with urology for TURP in 1 week.  Consultants:  Urology  Physical  therapy: Patient refuses skilled nursing facility. Procedures:  Cystoscopy-12/8 Antibiotics:  Ceftriaxone 12/5 (missed dose 12/8) >> 12/10  Ciprofloxacin 12/11 >> 12/18  Discharge Instructions  Discharge Orders   Future Orders Complete  By Expires   Call MD for:  severe uncontrolled pain  As directed    Call MD for:  temperature >100.4  As directed    Diet - low sodium heart healthy  As directed    Diet general  As directed    Discharge instructions  As directed    Comments:     Call physician or seek immediate medical assistance for pain, fever or worsening of condition   Increase activity slowly  As directed    Increase activity slowly  As directed    Walk with assistance  As directed        Medication List    STOP taking these medications       enoxaparin 40 MG/0.4ML injection  Commonly known as:  LOVENOX      TAKE these medications       albuterol 108 (90 BASE) MCG/ACT inhaler  Commonly known as:  PROVENTIL HFA;VENTOLIN HFA  Inhale 2 puffs into the lungs as directed.     amLODipine 10 MG tablet  Commonly known as:  NORVASC  Take 1 tablet (10 mg total) by mouth daily.     CERTAVITE/ANTIOXIDANTS PO  Take 1 tablet by mouth daily.     ciprofloxacin 500 MG tablet  Commonly known as:  CIPRO  Take 1 tablet (500 mg total) by mouth 2 (two) times daily.     feeding supplement (ENSURE COMPLETE) Liqd  Take 237 mLs by mouth 2 (two) times daily between meals.     ferrous sulfate 325 (65 FE) MG tablet  Take 325 mg by mouth daily with breakfast.     LORazepam 1 MG tablet  Commonly known as:  ATIVAN  Take 1 mg by mouth every 6 (six) hours as needed for anxiety.     MIRALAX powder  Generic drug:  polyethylene glycol powder  Take 17 g by mouth daily as needed for mild constipation.     omeprazole 20 MG capsule  Commonly known as:  PRILOSEC  Take 1 capsule (20 mg total) by mouth daily.     sertraline 100 MG tablet  Commonly known as:  ZOLOFT  Take 100 mg by mouth 2 (two) times daily.     tamsulosin 0.4 MG Caps capsule  Commonly known as:  FLOMAX  Take 1 capsule (0.4 mg total) by mouth daily.       No Known Allergies  The results of significant diagnostics from this hospitalization (including  imaging, microbiology, ancillary and laboratory) are listed below for reference.    Significant Diagnostic Studies: Dg Chest 1 View  01/28/2013   CLINICAL DATA:  Fever.  COPD.  Shortness of breath.  Asthma.  EXAM: CHEST - 1 VIEW  COMPARISON:  11/23/2012.  FINDINGS: Normal sized heart. Clear lungs. Lower thoracic spine degenerative changes. Old, healed right 6th rib fracture.  IMPRESSION: No acute abnormality.   Electronically Signed   By: Gordan Payment M.D.   On: 01/28/2013 15:51    Microbiology: Recent Results (from the past 240 hour(s))  URINE CULTURE     Status: None   Collection Time    01/28/13  2:35 PM      Result Value Range Status   Specimen Description URINE, CLEAN CATCH   Final   Special Requests NONE   Final  Culture  Setup Time     Final   Value: 01/28/2013 23:56     Performed at [facility]   Colony Count     Final   Value: >=100,000 COLONIES/ML     Performed at [facility]   Culture     Final   Value: [facility] MORGANII     Performed at [facility]   Report Status 01/31/2013 FINAL   Final   Organism ID, Bacteria [name]   Final  CULTURE, BLOOD (ROUTINE X 2)     Status: None   Collection Time    01/28/13  5:40 PM      Result Value Range Status   Specimen Description RIGHT ANTECUBITAL   Final   Special Requests BOTTLES DRAWN AEROBIC AND ANAEROBIC 10CC EACH   Final   Culture  Setup Time     Final   Value: 01/28/2013 14:00     Performed at [facility]   Culture     Final   Value: [facility] MORGANII     Note: Gram Stain Report Called to,Read Back By and Verified With: A ROGERS @ [id number] Wynonia Lawman     Performed at [facility]   Report Status 01/31/2013 FINAL   Final   Organism ID, Bacteria [name]   Final  CULTURE, BLOOD (ROUTINE X 2)     Status: None   Collection Time    01/28/13  5:45 PM      Result Value Range Status   Specimen Description BLOOD RIGHT HAND   Final   Special Requests BOTTLES  DRAWN AEROBIC AND ANAEROBIC 10CC EACH   Final   Culture  Setup Time     Final   Value: 01/29/2013 20:21     Performed at [facility]   Culture     Final   Value: [name]     Note: SUSCEPTIBILITIES PERFORMED ON PREVIOUS CULTURE WITHIN THE LAST 5 DAYS. Gram Stain Report Called to,Read Back By and Verified With: T WATKINS @ [id number]     Performed at [facility]   Report Status 01/31/2013 FINAL   Final  MRSA PCR SCREENING     Status: None   Collection Time    01/30/13 11:58 PM      Result Value Range Status   MRSA by PCR NEGATIVE  NEGATIVE Final   Comment:            The GeneXpert MRSA Assay (FDA     approved for NASAL specimens     only), is one component of a     comprehensive MRSA colonization     surveillance program. It is not     intended to diagnose MRSA     infection nor to guide or     monitor treatment for     MRSA infections.  CULTURE, BLOOD (ROUTINE X 2)     Status: None   Collection Time    01/31/13 10:17 AM      Result Value Range Status   Specimen Description BLOOD LEFT ANTECUBITAL   Final   Special Requests BOTTLES DRAWN AEROBIC AND ANAEROBIC 6CC   Final   Culture NO GROWTH 3 DAYS   Final   Report Status PENDING   Incomplete  CULTURE, BLOOD (ROUTINE X 2)     Status: None   Collection Time    01/31/13 10:17 AM      Result Value Range Status  Specimen Description BLOOD RIGHT HAND   Final   Special Requests BOTTLES DRAWN AEROBIC AND ANAEROBIC 6CC   Final   Culture NO GROWTH 3 DAYS   Final   Report Status PENDING   Incomplete     Labs: Basic Metabolic Panel:  Recent Labs Lab 01/28/13 1450 01/29/13 0543 01/30/13 0542 01/31/13 0454  NA 130* 132* 134* 135  K 4.3 3.6 3.4* 3.6  CL 92* 100 101 100  CO2 26 20 20 21   GLUCOSE 120* 169* 101* 148*  BUN 36* 31* 21 17  CREATININE 1.81* 1.41* 1.21 1.08  CALCIUM 10.1 8.6 8.7 8.8   CBC:  Recent Labs Lab 01/28/13 1450  01/30/13 0542 01/31/13 0454 02/01/13 0504  02/02/13 0508 02/03/13 0502  WBC 32.0*  < > 21.6* 15.4* 19.2* 17.4* 16.5*  NEUTROABS 26.6*  --   --   --   --   --   --   HGB 11.8*  < > 9.5* 9.7* 9.8* 9.8* 10.0*  HCT 35.0*  < > 27.5* 29.3* 29.3* 29.3* 30.1*  MCV 86.8  < > 86.2 86.9 86.4 85.9 86.2  PLT 411*  < > 381 382 366 394 442*  < > = values in this interval not displayed.   Principal Problem:   SIRS (systemic inflammatory response syndrome) Active Problems:   DEPRESSION   GERD   ETOH abuse   UTI (lower urinary tract infection)   Acute urinary retention   Time coordinating discharge: 35 minutes  Signed:  Brendia Sacks, MD [facility] 02/03/2013, 11:15 AM

## 2013-02-01 NOTE — Op Note (Signed)
NAMETRAJON, ROSETE NO.:  [id number]  MEDICAL RECORD NO.:  [id number]  LOCATION:  A303                          FACILITY:  [facility]  PHYSICIAN:  Ky Barban, M.D.DATE OF BIRTH:  Dec 26, 1955  DATE OF PROCEDURE: DATE OF DISCHARGE:                              OPERATIVE REPORT   PREOPERATIVE DIAGNOSIS:  Acute urinary retention.  POSTOPERATIVE DIAGNOSIS:  Benign prostatic hyperplasia.  PROCEDURE:  Cystoscopy.  DESCRIPTION OF PROCEDURE:  The patient was placed in supine position. After usual prep and drape, Xylocaine jelly instilled into the urethra after waiting adequate time, flexible cystoscope was introduced under direct vision.  Anterior urethra looks normal.  Prostatic urethra was completely obstructed with lateral lobe hypertrophy.  Bladder was inspected.  No tumor stone or foreign body is seen.  There is catheter reaction in the bladder and cystoscope was removed.  Foley catheter was reinserted.  The patient left the operating room in satisfactory condition.  He will need a TUR prostate.     Ky Barban, M.D.     MIJ/MEDQ  D:  01/31/2013  T:  02/01/2013  Job:  [id number]

## 2013-02-01 NOTE — Progress Notes (Signed)
Wbc went up will not do turp continue to treat bacteremia.will repeat cbc tomorrow again then decide when to do turp. Repeat blood culture rpeat on 5th and 8th dec negative.

## 2013-02-01 NOTE — OR Nursing (Signed)
Surgery postponed for today to increase wbc. Positive blood cultures.  Per dr. Jerre Simon  Will start new antibiotic and patient can eat today .  Return to previous orders.   Report called to [facility].

## 2013-02-02 DIAGNOSIS — A419 Sepsis, unspecified organism: Secondary | ICD-10-CM

## 2013-02-02 LAB — CBC
HCT: 29.3 % — ABNORMAL LOW (ref 39.0–52.0)
Hemoglobin: 9.8 g/dL — ABNORMAL LOW (ref 13.0–17.0)
MCH: 28.7 pg (ref 26.0–34.0)
MCHC: 33.4 g/dL (ref 30.0–36.0)
MCV: 85.9 fL (ref 78.0–100.0)
Platelets: 394 10*3/uL (ref 150–400)
RBC: 3.41 MIL/uL — ABNORMAL LOW (ref 4.22–5.81)
RDW: 15 % (ref 11.5–15.5)
WBC: 17.4 10*3/uL — ABNORMAL HIGH (ref 4.0–10.5)

## 2013-02-02 NOTE — Progress Notes (Signed)
[facility] PROGRESS NOTE  Duane Richardson FIE:[id number] DOB: Jan 10, 1956 DOA: 01/28/2013 PCP: Leo Grosser, MD  Assessment/Plan: 1. SIRS, suspected sepsis on admission: Resolved. Secondary to UTI and bacteremia. 2. [name] bacteremia: Presumed secondary to UTI. Dr. Jerral Ralph discussed with Dr. Ilsa Iha, ID, recommended a total of 2 weeks of antibiotic therapy. Followup repeat blood cultures. 3. [name] ZYS:[id number] to be related to urinary retention, BPH. 4. Acute renal failure: Thought to be secondary to obstructive uropathy. Resolved with placement of Foley and hydration. 5. Acute urinary retention: This was present on admission. Foley catheter drained 1.2 L of urine at that time. Likely secondary to BPH. Continue Flomax. Status post cystoscopy 12/8 per urology. TURP is planned in near future. Continue Foley catheter. 6. Anemia: Hemoglobin is stable. Likely anemia of acute illness. Followup as an outpatient. 7. History of alcohol use: Stable. No signs of withdrawal. 8. Current: Stable. Continue PPI. 9. History of chronic tremors   Change to oral ciprofloxacin 12/11. Given clinical improvement and decreased white blood cell count will not broaden antibiotics.  CBC in the morning.  Anticipate discharge on 12/11 with home health with outpatient followup for TURP.  Microbiology:   1. Blood culture on 12/5: [name]   2. Blood culture on 12/8: Pending, no growth to date   Code Status: full code DVT prophylaxis: heparin Family Communication: None present Disposition Plan: Patient refused SNF. Plan discharge home with home health services. Wheelchair.  Brendia Sacks, MD  [facility]  Pager [phone] If 7PM-7AM, please contact night-coverage at [email or web address], password [facility] 02/02/2013, 1:41 PM  LOS: 5 days   Summary: 57 year old African American male history of alcohol abuse, who was discharged from this facility to a SNF on October  2014 following a hip fracture. While at SNF, patient had a Foley catheter that was removed a few days prior to his discharge from SNF . He was discharged from SNF on 01/24/13. Since then patient has been having symptoms of fever, and prostatism-dribbling, hesitancy, urgency and poor stream. He subsequently presented to the ED on 01/28/13 with high-grade fevers, and on exam was found to have a distended bladder. He was then diagnosed with acute obstructive uropathy with renal failure and UTI. Blood and urine cultures are positive for [name]. Patient continues to be on IV Rocephin, Urology has been consulted and they have recommended inpatient TURP prior to discharge.   Consultants:  Urology  Physical therapy: Patient refuses skilled nursing facility.  Procedures:  Cystoscopy-12/8   Antibiotics:  Ceftriaxone 12/5 (missed dose 12/8) >>  HPI/Subjective: Feels much better. No complaints. Eating well. No pain.   Objective: Filed Vitals:   02/01/13 0753 02/01/13 1504 02/01/13 2300 02/02/13 0600  BP: 113/69 119/76 116/75 122/78  Pulse: 80 93 82 85  Temp: 99.2 F (37.3 C) 98.8 F (37.1 C) 99.5 F (37.5 C) 98.5 F (36.9 C)  TempSrc: Oral Oral  Oral  Resp: 20 20 20 17   Height:      Weight:      SpO2: 98% 98% 100% 96%    Intake/Output Summary (Last 24 hours) at 02/02/13 1341 Last data filed at 02/02/13 1337  Gross per 24 hour  Intake   2588 ml  Output   4600 ml  Net  -2012 ml     Filed Weights   01/28/13 1353 01/28/13 2134  Weight: 64.864 kg (143 lb) 57.8 kg (127 lb 6.8 oz)    Exam:   Afebrile, vital signs stable.  General: Appears calm  and comfortable. Speech fluent and clear.  Cardiovascular: Regular rate and rhythm. No murmur, rub or gallop.  Respiratory: Clear to auscultation bilaterally. No wheezes, rales or rhonchi. Normal respiratory effort.  Data Reviewed:  WBC down from 19.2 to 17.4  Hemoglobin stable 9.8.  Scheduled Meds: . cefTRIAXone  (ROCEPHIN)  IV  2 g Intravenous Q24H  . feeding supplement (ENSURE COMPLETE)  237 mL Oral BID BM  . ferrous sulfate  325 mg Oral Q breakfast  . heparin  5,000 Units Subcutaneous Q8H  . pantoprazole  40 mg Oral Daily  . sertraline  100 mg Oral BID  . sodium chloride  3 mL Intravenous Q12H  . tamsulosin  0.4 mg Oral Daily   Continuous Infusions: . sodium chloride 50 mL/hr at 02/01/13 1052    Principal Problem:   SIRS (systemic inflammatory response syndrome) Active Problems:   DEPRESSION   GERD   ETOH abuse   UTI (lower urinary tract infection)   Acute urinary retention   Time spent 25 minutes

## 2013-02-02 NOTE — Progress Notes (Signed)
Needs turp wbc came down today to 17.4 from 19.2 yesterday. He can go home on antibiotic for 2 weeks i will see him in office in one week to decide to bring him back for turp.

## 2013-02-03 LAB — CBC
HCT: 30.1 % — ABNORMAL LOW (ref 39.0–52.0)
Hemoglobin: 10 g/dL — ABNORMAL LOW (ref 13.0–17.0)
MCH: 28.7 pg (ref 26.0–34.0)
MCHC: 33.2 g/dL (ref 30.0–36.0)
MCV: 86.2 fL (ref 78.0–100.0)
Platelets: 442 10*3/uL — ABNORMAL HIGH (ref 150–400)
RBC: 3.49 MIL/uL — ABNORMAL LOW (ref 4.22–5.81)
RDW: 15.1 % (ref 11.5–15.5)
WBC: 16.5 10*3/uL — ABNORMAL HIGH (ref 4.0–10.5)

## 2013-02-03 NOTE — Progress Notes (Signed)
Note reviewed no change being discharged with foley catheter will see in office in one week to decide to bring back for turp.Marland Kitchen

## 2013-02-03 NOTE — Progress Notes (Signed)
[facility] PROGRESS NOTE  Duane Richardson Brave ZOX:[id number] DOB: 12/03/1955 DOA: 01/28/2013 PCP: Leo Grosser, MD  Assessment/Plan: 1. SIRS, suspected sepsis on admission: Resolved. Secondary to UTI and bacteremia. 2. [name] bacteremia: Presumed secondary to UTI. Dr. Jerral Ralph discussed with Dr. Ilsa Iha, ID, recommended a total of 2 weeks of antibiotic therapy. Followup repeat blood cultures. 3. [name] UTI: Secondary to urinary retention, BPH. 4. Acute renal failure: Thought to be secondary to obstructive uropathy. Resolved with placement of Foley and hydration. 5. Acute urinary retention: Present on admission. Foley catheter drained 1.2 L of urine at that time. Likely secondary to BPH. Continue Flomax. Status post cystoscopy 12/8 per urology. TURP is planned in near future. Continue Foley catheter. 6. Anemia: Hemoglobin stable. Likely anemia of acute illness. Followup as an outpatient. 7. History of alcohol use: Stable. No signs of withdrawal. 8. GERD: Stable. Continue PPI.   Complete Cipro as an outpatient  Continue Foley catheter  To followup with urology for TURP in 1 week.  Microbiology:   Blood culture on 12/5: [name]   Blood culture on 12/8: Pending, no growth to date   Code Status: full code DVT prophylaxis: heparin Family Communication: None present Disposition Plan: Patient refused SNF. Plan discharge home with home health services. Wheelchair.  Brendia Sacks, MD  [facility]  Pager [phone] If 7PM-7AM, please contact night-coverage at [email or web address], password [facility] 02/03/2013, 10:55 AM  LOS: 6 days   Summary: 57 year old African American male history of alcohol abuse, who was discharged from this facility to a SNF on October 2014 following a hip fracture. While at SNF, patient had a Foley catheter that was removed a few days prior to his discharge from SNF . He was discharged from SNF on 01/24/13. Since then patient  has been having symptoms of fever, and prostatism-dribbling, hesitancy, urgency and poor stream. He subsequently presented to the ED on 01/28/13 with high-grade fevers, and on exam was found to have a distended bladder. He was then diagnosed with acute obstructive uropathy with renal failure and UTI. Blood and urine cultures are positive for [name]. Patient continues to be on IV Rocephin, Urology has been consulted and they have recommended inpatient TURP prior to discharge.   Consultants:  Urology  Physical therapy: Patient refuses skilled nursing facility.  Procedures:  Cystoscopy-12/8   Antibiotics:  Ceftriaxone 12/5 (missed dose 12/8) >> 12/10  Ciprofloxacin 12/11 >> 12/18  HPI/Subjective: Feels very good. No complaints. No pain. Wants to go home.  Objective: Filed Vitals:   02/02/13 0600 02/02/13 1425 02/02/13 2049 02/03/13 0642  BP: 122/78 99/62 107/72 117/75  Pulse: 85 100 93 91  Temp: 98.5 F (36.9 C) 97.5 F (36.4 C) 98.8 F (37.1 C) 98 F (36.7 C)  TempSrc: Oral Oral Oral Oral  Resp: 17 18 20 20   Height:      Weight:      SpO2: 96% 99% 99% 98%    Intake/Output Summary (Last 24 hours) at 02/03/13 1055 Last data filed at 02/03/13 0900  Gross per 24 hour  Intake   1200 ml  Output   4050 ml  Net  -2850 ml     Filed Weights   01/28/13 1353 01/28/13 2134  Weight: 64.864 kg (143 lb) 57.8 kg (127 lb 6.8 oz)    Exam:   Afebrile, vital signs stable.  General: Appears comfortable, calm.  Psychiatric: Grossly normal mood and affect. Speech fluent and appropriate.  Cardiovascular: Regular rate and rhythm. No murmur, rub or  gallop.  Respiratory: Clear to auscultation bilaterally. No wheezes, rales or rhonchi. Normal respiratory effort.  Data Reviewed:  WBC down to 16.5  Hemoglobin stable 10.0  Scheduled Meds: . cefTRIAXone (ROCEPHIN)  IV  2 g Intravenous Q24H  . feeding supplement (ENSURE COMPLETE)  237 mL Oral BID BM  . ferrous  sulfate  325 mg Oral Q breakfast  . heparin  5,000 Units Subcutaneous Q8H  . pantoprazole  40 mg Oral Daily  . sertraline  100 mg Oral BID  . sodium chloride  3 mL Intravenous Q12H  . tamsulosin  0.4 mg Oral Daily   Continuous Infusions:    Principal Problem:   SIRS (systemic inflammatory response syndrome) Active Problems:   DEPRESSION   GERD   ETOH abuse   UTI (lower urinary tract infection)   Acute urinary retention

## 2013-02-03 NOTE — Progress Notes (Signed)
Patient states understanding of discharge instructions, discharged with foley intact.

## 2013-02-03 NOTE — Progress Notes (Signed)
UR chart review completed.  

## 2013-02-04 LAB — TYPE AND SCREEN
ABO/RH(D): A POS
Antibody Screen: NEGATIVE
Unit division: 0
Unit division: 0

## 2013-02-05 LAB — CULTURE, BLOOD (ROUTINE X 2)
Culture: NO GROWTH
Culture: NO GROWTH

## 2013-02-10 ENCOUNTER — Encounter (HOSPITAL_COMMUNITY): Payer: Self-pay | Admitting: Emergency Medicine

## 2013-02-10 ENCOUNTER — Emergency Department (HOSPITAL_COMMUNITY)
Admission: EM | Admit: 2013-02-10 | Discharge: 2013-02-10 | Disposition: A | Discharge status: Home / Self Care | Payer: Medicaid Other | Attending: Emergency Medicine | Admitting: Emergency Medicine

## 2013-02-10 DIAGNOSIS — J4489 Other specified chronic obstructive pulmonary disease: Secondary | ICD-10-CM | POA: Insufficient documentation

## 2013-02-10 DIAGNOSIS — Z79899 Other long term (current) drug therapy: Secondary | ICD-10-CM | POA: Insufficient documentation

## 2013-02-10 DIAGNOSIS — E860 Dehydration: Secondary | ICD-10-CM | POA: Insufficient documentation

## 2013-02-10 DIAGNOSIS — F17200 Nicotine dependence, unspecified, uncomplicated: Secondary | ICD-10-CM | POA: Insufficient documentation

## 2013-02-10 DIAGNOSIS — R000 Tachycardia, unspecified: Secondary | ICD-10-CM | POA: Insufficient documentation

## 2013-02-10 DIAGNOSIS — K219 Gastro-esophageal reflux disease without esophagitis: Secondary | ICD-10-CM | POA: Insufficient documentation

## 2013-02-10 DIAGNOSIS — F411 Generalized anxiety disorder: Secondary | ICD-10-CM | POA: Insufficient documentation

## 2013-02-10 DIAGNOSIS — Z792 Long term (current) use of antibiotics: Secondary | ICD-10-CM | POA: Insufficient documentation

## 2013-02-10 DIAGNOSIS — H269 Unspecified cataract: Secondary | ICD-10-CM | POA: Insufficient documentation

## 2013-02-10 DIAGNOSIS — Z8739 Personal history of other diseases of the musculoskeletal system and connective tissue: Secondary | ICD-10-CM | POA: Insufficient documentation

## 2013-02-10 DIAGNOSIS — I959 Hypotension, unspecified: Secondary | ICD-10-CM

## 2013-02-10 DIAGNOSIS — Z9889 Other specified postprocedural states: Secondary | ICD-10-CM | POA: Insufficient documentation

## 2013-02-10 DIAGNOSIS — Z87440 Personal history of urinary (tract) infections: Secondary | ICD-10-CM | POA: Insufficient documentation

## 2013-02-10 DIAGNOSIS — F3130 Bipolar disorder, current episode depressed, mild or moderate severity, unspecified: Secondary | ICD-10-CM | POA: Insufficient documentation

## 2013-02-10 DIAGNOSIS — F1021 Alcohol dependence, in remission: Secondary | ICD-10-CM | POA: Insufficient documentation

## 2013-02-10 DIAGNOSIS — N400 Enlarged prostate without lower urinary tract symptoms: Secondary | ICD-10-CM | POA: Insufficient documentation

## 2013-02-10 DIAGNOSIS — I10 Essential (primary) hypertension: Secondary | ICD-10-CM | POA: Insufficient documentation

## 2013-02-10 DIAGNOSIS — J449 Chronic obstructive pulmonary disease, unspecified: Secondary | ICD-10-CM | POA: Insufficient documentation

## 2013-02-10 LAB — BASIC METABOLIC PANEL
BUN: 19 mg/dL (ref 6–23)
CO2: 26 mEq/L (ref 19–32)
Calcium: 10.8 mg/dL — ABNORMAL HIGH (ref 8.4–10.5)
Chloride: 91 mEq/L — ABNORMAL LOW (ref 96–112)
Creatinine, Ser: 0.95 mg/dL (ref 0.50–1.35)
GFR calc Af Amer: >90 mL/min (ref >90)
GFR calc non Af Amer: 89 mL/min — ABNORMAL LOW (ref >90)
Glucose, Bld: 99 mg/dL (ref 70–99)
Potassium: 4.7 mEq/L (ref 3.5–5.1)
Sodium: 132 mEq/L — ABNORMAL LOW (ref 135–145)

## 2013-02-10 LAB — URINALYSIS, ROUTINE W REFLEX MICROSCOPIC
Bilirubin Urine: NEGATIVE
Glucose, UA: NEGATIVE mg/dL
Ketones, ur: NEGATIVE mg/dL
Nitrite: NEGATIVE
Protein, ur: NEGATIVE mg/dL
Specific Gravity, Urine: >1.03 — ABNORMAL HIGH (ref 1.005–1.030)
Urobilinogen, UA: 0.2 mg/dL (ref 0.0–1.0)
pH: 6 (ref 5.0–8.0)

## 2013-02-10 LAB — CBC WITH DIFFERENTIAL/PLATELET
Basophils Absolute: 0.2 10*3/uL — ABNORMAL HIGH (ref 0.0–0.1)
Basophils Relative: 2 % — ABNORMAL HIGH (ref 0–1)
Eosinophils Absolute: 0.1 10*3/uL (ref 0.0–0.7)
Eosinophils Relative: 1 % (ref 0–5)
HCT: 39.3 % (ref 39.0–52.0)
Hemoglobin: 13 g/dL (ref 13.0–17.0)
Lymphocytes Relative: 22 % (ref 12–46)
Lymphs Abs: 2.6 10*3/uL (ref 0.7–4.0)
MCH: 28.8 pg (ref 26.0–34.0)
MCHC: 33.1 g/dL (ref 30.0–36.0)
MCV: 86.9 fL (ref 78.0–100.0)
Monocytes Absolute: 0.9 10*3/uL (ref 0.1–1.0)
Monocytes Relative: 7 % (ref 3–12)
Neutro Abs: 8.1 10*3/uL — ABNORMAL HIGH (ref 1.7–7.7)
Neutrophils Relative %: 68 % (ref 43–77)
Platelets: 679 10*3/uL — ABNORMAL HIGH (ref 150–400)
RBC: 4.52 MIL/uL (ref 4.22–5.81)
RDW: 15.1 % (ref 11.5–15.5)
WBC: 11.9 10*3/uL — ABNORMAL HIGH (ref 4.0–10.5)

## 2013-02-10 LAB — URINE MICROSCOPIC-ADD ON

## 2013-02-10 LAB — LACTIC ACID, PLASMA: Lactic Acid, Venous: 2.2 mmol/L (ref 0.5–2.2)

## 2013-02-10 MED ORDER — SODIUM CHLORIDE 0.9 % IV BOLUS (SEPSIS)
1000.0000 mL | Freq: Once | INTRAVENOUS | Status: AC
  Administered 2013-02-10: 1000 mL via INTRAVENOUS

## 2013-02-10 NOTE — ED Notes (Addendum)
Pt came with social worker from Dr Rudean Haskell office. With hypotension  bp 96/50 and p 131.  Was in office for eval of prostate, Has a catheter in place.

## 2013-02-10 NOTE — ED Notes (Signed)
Pt sent from Dr. Rudean Haskell office.  Was told pt's HR was 134 in the office and bp 94/50.  Staff also reports pt has ETOH abuse and c/o generalized weakness.  Dr. Jerre Simon instructed to leave foley in.

## 2013-02-10 NOTE — ED Provider Notes (Signed)
I saw and evaluated the patient, reviewed the resident's note and I agree with the findings and plan.   .Face to face Exam:  General:  Awake HEENT:  Atraumatic Resp:  Normal effort Abd:  Nondistended Neuro:No focal weakness    Nelia Shi, MD 02/10/13 2104

## 2013-02-10 NOTE — ED Notes (Signed)
Pt was sent by Dr. Jerre Simon due to low BP and elevated HR while at his office. Pt denies any pain, SOB, weakness. Pt was recently treated for UTI and has foley in place. Pt was at Dr. Rudean Haskell office for evaluation of prostate.

## 2013-02-10 NOTE — ED Provider Notes (Signed)
CSN: [id number]     Arrival date & time 02/10/13  1618 History   None    Chief Complaint  Patient presents with  . Hypotension   (Consider location/radiation/quality/duration/timing/severity/associated sxs/prior Treatment) HPI [name] is a 57 y.o. male w/ PMHx of HTN, HLD, COPD, Asthma, GERD, BPH, anxiety, depression, Bipolar 1, and recent hospital admission for morganella bacteremia 2/2 UTI, presents to the [facility] from urology clinic after found to have BP of 90/50 and tachycardia. The patient denies any symptoms. No fever, chills, nausea, vomiting, diarrhea, cough, SOB, dysuria, dizziness, lightheadedness, or palpitations. He said he is not feeling ill or different in any way today. He was discharged from the hospital on 02/01/13 after admission for morganella morgagni bacteremia, likely 2/2 UTI. Patient currently on Cipro po for total of 14 days after his discharge. Was being seen today in urology office as he is to have a TURP in the near future for severe BPH. Currently still with a foley catheter in place d/t urinary retention/obstruction. Patient denies any recent issues with the foley, no urinary symptoms, no hematuria, or suprapubic tenderness.   Past Medical History  Diagnosis Date  . Depression   . Elevated PSA   . Arthritis   . Hyperlipidemia   . Cataract   . Anxiety   . Bipolar 1 disorder   . ETOH abuse     quit Oct 2013, then relapsed, as of Dec 2013 now has abstained X 1 month  . COPD (chronic obstructive pulmonary disease)   . Asthma   . Allergy   . Hypertension   . GERD (gastroesophageal reflux disease)    Past Surgical History  Procedure Laterality Date  . Colonoscopy  12/21/09    XBM:WUXL papilla otherwise normal/ pancolonic diverticula/mutiple colonic poylps  . Spine surgery    . Back surgery      lunbar  . Colonoscopy with esophagogastroduodenoscopy (egd) N/A 10/20/2012    Procedure: COLONOSCOPY WITH ESOPHAGOGASTRODUODENOSCOPY (EGD);  Surgeon: Corbin Ade, MD;  Location: [facility];  Service: Endoscopy;  Laterality: N/A;  10;15  . Orif hip fracture Left 11/24/2012    Procedure: OPEN REDUCTION INTERNAL FIXATION HIP;  Surgeon: Darreld Mclean, MD;  Location: [facility];  Service: Orthopedics;  Laterality: Left;  . Cystoscopy N/A 01/31/2013    Procedure: CYSTOSCOPY FLEXIBLE;  Surgeon: Ky Barban, MD;  Location: [facility];  Service: Urology;  Laterality: N/A;  I would like to do this around 1 pm on monday.    Family History  Problem Relation Age of Onset  . Colon cancer Neg Hx   . Mental illness Sister    History  Substance Use Topics  . Smoking status: Current Every Day Smoker -- 1.00 packs/day for 30 years    Types: Cigars  . Smokeless tobacco: Not on file  . Alcohol Use: 18.0 oz/week    30 Glasses of wine per week     Comment: history of  about a liter of wine per day-before that    Review of Systems General: Denies fever, chills, diaphoresis, appetite change and fatigue.  Respiratory: Denies SOB, DOE, cough, chest tightness, and wheezing.   Cardiovascular: Denies chest pain, palpitations and leg swelling.  Gastrointestinal: Denies nausea, vomiting, abdominal pain, diarrhea, constipation, blood in stool and abdominal distention.  Genitourinary: Denies dysuria, urgency, frequency, hematuria, flank pain and difficulty urinating.  Endocrine: Denies hot or cold intolerance, sweats, polyuria, polydipsia. Musculoskeletal: Denies myalgias, back pain, joint swelling, arthralgias and gait problem.  Skin: Denies pallor, rash and wounds.  Neurological: Denies dizziness, seizures, syncope, weakness, lightheadedness, numbness and headaches.  Psychiatric/Behavioral: Denies mood changes, confusion, nervousness, sleep disturbance and agitation.  Allergies  Review of patient's allergies indicates no known allergies.  Home Medications   Current Outpatient Rx  Name  Route  Sig  Dispense  Refill  . albuterol (PROVENTIL HFA;VENTOLIN HFA) 108  (90 BASE) MCG/ACT inhaler   Inhalation   Inhale 2 puffs into the lungs as directed.   1 Inhaler   2     Every 4-6 hrs as needed   . amLODipine (NORVASC) 10 MG tablet   Oral   Take 1 tablet (10 mg total) by mouth daily.   90 tablet   3   . ciprofloxacin (CIPRO) 500 MG tablet   Oral   Take 1 tablet (500 mg total) by mouth 2 (two) times daily.   20 tablet   0   . feeding supplement, ENSURE COMPLETE, (ENSURE COMPLETE) LIQD   Oral   Take 237 mLs by mouth 2 (two) times daily between meals.   60 Bottle   0   . ferrous sulfate 325 (65 FE) MG tablet   Oral   Take 325 mg by mouth daily with breakfast.         . LORazepam (ATIVAN) 1 MG tablet   Oral   Take 1 mg by mouth every 6 (six) hours as needed for anxiety.         . Multiple Vitamins-Minerals (CERTAVITE/ANTIOXIDANTS PO)   Oral   Take 1 tablet by mouth daily.         Marland Kitchen omeprazole (PRILOSEC) 20 MG capsule   Oral   Take 1 capsule (20 mg total) by mouth daily.   90 capsule   3   . polyethylene glycol powder (MIRALAX) powder   Oral   Take 17 g by mouth daily as needed for mild constipation.         . sertraline (ZOLOFT) 100 MG tablet   Oral   Take 100 mg by mouth 2 (two) times daily.         . tamsulosin (FLOMAX) 0.4 MG CAPS capsule   Oral   Take 1 capsule (0.4 mg total) by mouth daily.   30 capsule   0    Physical Exam Filed Vitals:   02/10/13 1622  BP: 112/85  Pulse: 110  Temp: 97.3 F (36.3 C)  TempSrc: Oral  Resp: 20  Height: 6\' 1"  (1.854 m)  Weight: 127 lb (57.607 kg)  SpO2: 100%  General: Vital signs reviewed.  Patient is a well-developed and well-nourished, in no acute distress and cooperative with exam. Alert and oriented x3.  Head: Normocephalic and atraumatic. Eyes: PERRL, EOMI, conjunctivae normal, No scleral icterus.  Neck: Supple, trachea midline, normal ROM, No JVD, masses, thyromegaly, or carotid bruit present.  Cardiovascular: RRR, S1 normal, S2 normal, no murmurs, gallops, or  rubs. Pulmonary/Chest: Normal respiratory effort, CTAB, no wheezes, rales, or rhonchi. Abdominal: Soft, non-tender, non-distended, bowel sounds are normal, no masses, organomegaly, or guarding present.  GU: Foley in place, urine clear, yellow. No erythema at urethral meatus.  Musculoskeletal: No joint deformities, erythema, or stiffness, ROM full and no nontender. Extremities: No swelling or edema,  pulses symmetric and intact bilaterally. No cyanosis or clubbing. Neurological: A&O x3, Strength is normal and symmetric bilaterally, cranial nerve II-XII are grossly intact, no focal motor deficit, sensory intact to light touch bilaterally.  Skin: Warm, dry and intact. No  rashes or erythema. Psychiatric: Normal mood and affect. speech and behavior is normal. Cognition and memory are normal.   ED Course  Procedures (including critical care time) Labs Review Labs Reviewed  CBC WITH DIFFERENTIAL  BASIC METABOLIC PANEL  URINALYSIS, ROUTINE W REFLEX MICROSCOPIC  LACTIC ACID, PLASMA   Imaging Review No results found.  EKG Interpretation   None       MDM  [name] is a 57 y.o. male w/ PMHx of HTN, HLD, COPD, Asthma, GERD, BPH, anxiety, depression, Bipolar 1, and recent hospital admission for morganella bacteremia 2/2 UTI, presents to the [facility] from urology clinic after found to have BP of 90/50 and tachycardia. Given recent history of bacteremia, obvious concern for sepsis/severe sepsis. Patient denies symptoms of infection at this time.  BP in ED has been stable, no hypotension, however, patient with continued tachycardia in the 100-120's. Bedside US shows collapse of the IVC suggesting intravascular volume depletion.  -CBC w/ diff shows mild leukocytosis of 11.9, improved since admission. Hb 13.0, increased from baseline, suggesting possible hemoconcentration and dehydration. -BMET w/ mild hyponatremia (132), and mild hypercalcemia (10.8). Otherwise wnl. -Lactic acid 2.2. -UA w/  small leukocytes, large Hb, WBC's 7-10, RBC's 11-20, few bacteria, and calcium oxalate crystals. Appears to be somewhat improved since previous admission. -Give NS 1L bolus x 2.  Patient does not appear septic at this time, more likely volume depleted. Given 2 L of NS w/ clinical improvement, no longer tachycardic or hypotensive. Stable for discharge. Already has an appointment scheduled w/ his PCP next week.    Courtney Paris, MD 02/10/13 2103

## 2013-02-12 LAB — URINE CULTURE
Colony Count: NO GROWTH
Culture: NO GROWTH

## 2013-02-15 ENCOUNTER — Ambulatory Visit: Payer: Self-pay | Admitting: Family Medicine

## 2013-02-21 ENCOUNTER — Encounter: Payer: Self-pay | Admitting: Family Medicine

## 2013-02-21 ENCOUNTER — Ambulatory Visit (INDEPENDENT_AMBULATORY_CARE_PROVIDER_SITE_OTHER): Payer: Medicaid Other | Admitting: Family Medicine

## 2013-02-21 VITALS — BP 100/64 | HR 64 | Temp 98.5°F | Resp 16 | Wt 137.0 lb

## 2013-02-21 DIAGNOSIS — Z09 Encounter for follow-up examination after completed treatment for conditions other than malignant neoplasm: Secondary | ICD-10-CM

## 2013-02-21 DIAGNOSIS — G609 Hereditary and idiopathic neuropathy, unspecified: Secondary | ICD-10-CM

## 2013-02-21 DIAGNOSIS — N419 Inflammatory disease of prostate, unspecified: Secondary | ICD-10-CM

## 2013-02-21 LAB — CBC WITH DIFFERENTIAL/PLATELET
Basophils Absolute: 0.1 10*3/uL (ref 0.0–0.1)
Basophils Relative: 1 % (ref 0–1)
Eosinophils Absolute: 0.3 10*3/uL (ref 0.0–0.7)
Eosinophils Relative: 4 % (ref 0–5)
HCT: 30.5 % — ABNORMAL LOW (ref 39.0–52.0)
Hemoglobin: 10.3 g/dL — ABNORMAL LOW (ref 13.0–17.0)
Lymphocytes Relative: 38 % (ref 12–46)
Lymphs Abs: 3.3 10*3/uL (ref 0.7–4.0)
MCH: 27.8 pg (ref 26.0–34.0)
MCHC: 33.8 g/dL (ref 30.0–36.0)
MCV: 82.2 fL (ref 78.0–100.0)
Monocytes Absolute: 1.7 10*3/uL — ABNORMAL HIGH (ref 0.1–1.0)
Monocytes Relative: 20 % — ABNORMAL HIGH (ref 3–12)
Neutro Abs: 3.2 10*3/uL (ref 1.7–7.7)
Neutrophils Relative %: 37 % — ABNORMAL LOW (ref 43–77)
Platelets: 402 10*3/uL — ABNORMAL HIGH (ref 150–400)
RBC: 3.71 MIL/uL — ABNORMAL LOW (ref 4.22–5.81)
RDW: 15.7 % — ABNORMAL HIGH (ref 11.5–15.5)
WBC: 8.6 10*3/uL (ref 4.0–10.5)

## 2013-02-21 NOTE — Progress Notes (Signed)
Subjective:    Patient ID: Duane Richardson, male    DOB: Nov 27, 1955, 57 y.o.   MRN: [id number]  HPI Patient was discharged to a skilled nursing facility from [facility] after a fall and hip fracture in October. He was discharged with a Foley catheter. The Foley catheter was discontinued prior to the patient being discharged from the skilled nursing facility to home.  The patient then presented to the emergency room in early December with fever, chills, leukocytosis with a white blood cell count of 32, acute renal insufficiency with a creatinine of 1.8, urosepsis from urinary tract infection and prostatitis.  Patient was admitted to the hospital and given antibiotics. A Foley catheter was placed. Urology was consulted. The patient was then discharged home on antibiotics to treat prostatitis with a plan for an outpatient TURP once clinically stable.  The plan at discharge was for the patient to continue to have the Foley catheter until outpatient TURP.  He is here today for followup.  The patient is currently sober and has been for several weeks. He is committed to maintaining his sobriety. He is not currently in AA group, however he is interested in joining one. He is accounted by a Child psychotherapist today who provided contact information for AA groups. He still has his Foley catheter in place. He is not complaining of any fever. He is not complaining of any fatigue, nausea, vomiting, or other signs of systemic illness. His blood pressures currently well controlled at 100/64. Ironically this is the first time I have seen the patient when he has been sober. He is a very pleasant man and is very appreciative of all the help he has had in the hospital.  He does complain of numbness in both feet.   Past Medical History  Diagnosis Date  . Depression   . Elevated PSA   . Arthritis   . Hyperlipidemia   . Cataract   . Anxiety   . Bipolar 1 disorder   . ETOH abuse     quit Oct 2013, then relapsed, as of Dec 2013  now has abstained X 1 month  . COPD (chronic obstructive pulmonary disease)   . Asthma   . Allergy   . Hypertension   . GERD (gastroesophageal reflux disease)    Current Outpatient Prescriptions on File Prior to Visit  Medication Sig Dispense Refill  . albuterol (PROVENTIL HFA;VENTOLIN HFA) 108 (90 BASE) MCG/ACT inhaler Inhale 2 puffs into the lungs as directed.  1 Inhaler  2  . amLODipine (NORVASC) 10 MG tablet Take 1 tablet (10 mg total) by mouth daily.  90 tablet  3  . ciprofloxacin (CIPRO) 500 MG tablet Take 1 tablet (500 mg total) by mouth 2 (two) times daily.  20 tablet  0  . feeding supplement, ENSURE COMPLETE, (ENSURE COMPLETE) LIQD Take 237 mLs by mouth 2 (two) times daily between meals.  60 Bottle  0  . ferrous sulfate 325 (65 FE) MG tablet Take 325 mg by mouth daily with breakfast.      . LORazepam (ATIVAN) 1 MG tablet Take 1 mg by mouth every 6 (six) hours as needed for anxiety.      . Multiple Vitamins-Minerals (CERTAVITE/ANTIOXIDANTS PO) Take 1 tablet by mouth daily.      Marland Kitchen omeprazole (PRILOSEC) 20 MG capsule Take 1 capsule (20 mg total) by mouth daily.  90 capsule  3  . sertraline (ZOLOFT) 100 MG tablet Take 100 mg by mouth 2 (two) times daily.      Marland Kitchen  tamsulosin (FLOMAX) 0.4 MG CAPS capsule Take 1 capsule (0.4 mg total) by mouth daily.  30 capsule  0  . polyethylene glycol powder (MIRALAX) powder Take 17 g by mouth daily as needed for mild constipation.       No current facility-administered medications on file prior to visit.   No Known Allergies History   Social History  . Marital Status: Single    Spouse Name: N/A    Number of Children: 0  . Years of Education: N/A   Occupational History  . disabled    Social History Main Topics  . Smoking status: Current Every Day Smoker -- 1.00 packs/day for 30 years    Types: Cigars  . Smokeless tobacco: Not on file  . Alcohol Use: 18.0 oz/week    30 Glasses of wine per week     Comment: history of  about a liter of wine  per day-before that  . Drug Use: No  . Sexual Activity: Yes    Birth Control/ Protection: None   Other Topics Concern  . Not on file   Social History Narrative   In prison x 2, 14rs, 29 mo   Out for several yrs   Lives alone         Family History  Problem Relation Age of Onset  . Colon cancer Neg Hx   . Mental illness Sister      Review of Systems  All other systems reviewed and are negative.       Objective:   Physical Exam  Vitals reviewed. Constitutional: He is oriented to person, place, and time.  Neck: No thyromegaly present.  Cardiovascular: Normal rate, regular rhythm and normal heart sounds.  Exam reveals no gallop and no friction rub.   No murmur heard. Pulmonary/Chest: Effort normal and breath sounds normal. No respiratory distress. He has no wheezes. He has no rales. He exhibits no tenderness.  Abdominal: Soft. Bowel sounds are normal. He exhibits no distension and no mass. There is no tenderness. There is no rebound and no guarding.  Musculoskeletal: He exhibits no edema.  Lymphadenopathy:    He has no cervical adenopathy.  Neurological: He is alert and oriented to person, place, and time. He displays normal reflexes. No cranial nerve deficit. He exhibits normal muscle tone. Coordination normal.  Psychiatric: He has a normal mood and affect. His behavior is normal. Judgment and thought content normal.          Assessment & Plan:  Prostatitis - Plan: COMPLETE METABOLIC PANEL WITH GFR, CBC with Differential  Hospital discharge follow-up  Unspecified hereditary and idiopathic peripheral neuropathy - Plan: TSH, Vitamin B12  I believe the patient is medically stable to proceed with outpatient TURP. Consult his urologist to provide medical clearance. I will check a CMP to monitor his creatinine closely as well as a CBC to ensure complete resolution of his leukocytosis. The patient's neuropathy I will also check a B12 level as well as a TSH. However I  anticipate patient has peripheral neuropathy due to alcohol abuse. I encouraged patient to continue to maintain his sobriety and I recommended AA.  Recheck in 6 months or as needed

## 2013-02-22 ENCOUNTER — Encounter: Payer: Self-pay | Admitting: Family Medicine

## 2013-02-22 LAB — COMPLETE METABOLIC PANEL WITH GFR
ALT: 13 U/L (ref 0–53)
AST: 19 U/L (ref 0–37)
Albumin: 3.7 g/dL (ref 3.5–5.2)
Alkaline Phosphatase: 77 U/L (ref 39–117)
BUN: 15 mg/dL (ref 6–23)
CO2: 28 mEq/L (ref 19–32)
Calcium: 9.5 mg/dL (ref 8.4–10.5)
Chloride: 99 mEq/L (ref 96–112)
Creat: 0.77 mg/dL (ref 0.50–1.35)
GFR, Est African American: >89 mL/min
GFR, Est Non African American: >89 mL/min
Glucose, Bld: 77 mg/dL (ref 70–99)
Potassium: 5.1 mEq/L (ref 3.5–5.3)
Sodium: 135 mEq/L (ref 135–145)
Total Bilirubin: 0.3 mg/dL (ref 0.3–1.2)
Total Protein: 7.1 g/dL (ref 6.0–8.3)

## 2013-02-22 LAB — TSH: TSH: 2.416 u[IU]/mL (ref 0.350–4.500)

## 2013-02-22 LAB — VITAMIN B12: Vitamin B-12: 521 pg/mL (ref 211–911)

## 2013-02-23 ENCOUNTER — Encounter: Payer: Self-pay | Admitting: Family Medicine

## 2013-03-09 ENCOUNTER — Encounter: Payer: Self-pay | Admitting: Family Medicine

## 2013-03-15 ENCOUNTER — Encounter (HOSPITAL_COMMUNITY)
Admission: RE | Admit: 2013-03-15 | Discharge: 2013-03-15 | Disposition: A | Discharge status: Home / Self Care | Payer: Medicaid Other | Source: Ambulatory Visit | Attending: Urology | Admitting: Urology

## 2013-03-15 ENCOUNTER — Encounter (HOSPITAL_COMMUNITY): Payer: Self-pay | Admitting: Pharmacy Technician

## 2013-03-15 ENCOUNTER — Encounter (HOSPITAL_COMMUNITY): Payer: Self-pay

## 2013-03-15 DIAGNOSIS — Z01818 Encounter for other preprocedural examination: Secondary | ICD-10-CM | POA: Insufficient documentation

## 2013-03-15 DIAGNOSIS — Z01812 Encounter for preprocedural laboratory examination: Secondary | ICD-10-CM | POA: Insufficient documentation

## 2013-03-15 LAB — BASIC METABOLIC PANEL
BUN: 14 mg/dL (ref 6–23)
CO2: 27 mEq/L (ref 19–32)
Calcium: 9.7 mg/dL (ref 8.4–10.5)
Chloride: 101 mEq/L (ref 96–112)
Creatinine, Ser: 0.71 mg/dL (ref 0.50–1.35)
GFR calc Af Amer: >90 mL/min (ref >90)
GFR calc non Af Amer: >90 mL/min (ref >90)
Glucose, Bld: 85 mg/dL (ref 70–99)
Potassium: 4.7 mEq/L (ref 3.7–5.3)
Sodium: 138 mEq/L (ref 137–147)

## 2013-03-15 LAB — CBC
HCT: 32.5 % — ABNORMAL LOW (ref 39.0–52.0)
Hemoglobin: 10.5 g/dL — ABNORMAL LOW (ref 13.0–17.0)
MCH: 27.1 pg (ref 26.0–34.0)
MCHC: 32.3 g/dL (ref 30.0–36.0)
MCV: 84 fL (ref 78.0–100.0)
Platelets: 317 10*3/uL (ref 150–400)
RBC: 3.87 MIL/uL — ABNORMAL LOW (ref 4.22–5.81)
RDW: 15.6 % — ABNORMAL HIGH (ref 11.5–15.5)
WBC: 6.2 10*3/uL (ref 4.0–10.5)

## 2013-03-15 LAB — TYPE AND SCREEN
ABO/RH(D): A POS
Antibody Screen: NEGATIVE

## 2013-03-15 NOTE — Patient Instructions (Signed)
[place]  03/15/2013   Your procedure is scheduled on:  03/22/2013  Report to [facility] at  900  AM.  Call this number if you have problems the morning of surgery: [phone]   Remember:   Do not eat food or drink liquids after midnight.   Take these medicines the morning of surgery with A SIP OF WATER: ativan, norvasc, prilosec, zoloft, flomax   Do not wear jewelry, make-up or nail polish.  Do not wear lotions, powders, or perfumes.   Do not shave 48 hours prior to surgery. Men may shave face and neck.  Do not bring valuables to the hospital.  [facility] is not responsible for any belongings or valuables.               Contacts, dentures or bridgework may not be worn into surgery.  Leave suitcase in the car. After surgery it may be brought to your room.  For patients admitted to the hospital, discharge time is determined by your treatment team.               Patients discharged the day of surgery will not be allowed to drive home.  Name and phone number of your driver: family  Special Instructions: Shower using CHG 2 nights before surgery and the night before surgery.  If you shower the day of surgery use CHG.  Use special wash - you have one bottle of CHG for all showers.  You should use approximately 1/3 of the bottle for each shower.   Please read over the following fact sheets that you were given: Pain Booklet, Coughing and Deep Breathing, Blood Transfusion Information, Surgical Site Infection Prevention, Anesthesia Post-op Instructions and Care and Recovery After Surgery Transurethral Resection of the Prostate Transurethral resection of the prostate (TURP) is the removal of part of your prostate to treat noncancerous (benign) prostatic hyperplasia (BPH). BPH typically occurs in men older than 40 years. It is the abnormal growth of cells in your prostate. Specifically, it is an abnormal increase in the number of cells that make up your prostate tissue. This causes an  increase in the size of your prostate. Often, in the case of BPH, the prostate becomes so large that it compresses the tube that drains urine out of your body from your bladder (urethra). Eventually, this compression can obstruct the flow of urine from your bladder. This obstruction can cause recurrent bladder infection and difficulties with bladder control and bladder emptying. The goal of TURP is to remove enough prostate tissue to allow for an unobstructed flow of urine, which often resolves the associated conditions. LET YOUR CAREGIVER KNOW ABOUT:  Any allergies you have.  Any medicines you are taking, including herbs, eye drops, over-the-counter medicines, and creams.  Any problems you have had with the use of anesthetics.  Any blood disorders you have, including bleeding problems and clotting problems.  Previous surgeries you have had.  Any prostate infections you have had. RISKS AND COMPLICATIONS Generally, TURP is a safe procedure. However, as with any surgical procedure, complications can occur. Possible complications associated with TURP include:  Difficulty getting an erection.  Scarring, which may cause problems with the flow of your urine.  Injury to your urethra.  Incontinence from injury to the muscle that surrounds your prostate, which controls urine flow.  Infection.  Bleeding.  Injury to your bladder (rare). BEFORE THE PROCEDURE  Your caregiver will tell you when you need to stop eating  and drinking. If you take any medicines, your caregiver will tell you which ones you may keep taking and which ones you will have to stop taking and when.  Just before the procedure you will also receive medicine to make you fall asleep (general anesthetic). This will be given through a tube that is inserted into one of your veins (intravenous [IV] tube). PROCEDURE Your surgeon inserts an instrument that is similar to a telescope with an electric cutting edge (resectoscope) through  your urethra to the area of the prostate gland. The cutting edge is used to remove enlarged pieces of your prostate, one piece at a time. At the end of your procedure, a flexible tube (catheter) will be inserted into your urethra to drain your bladder. Special plastic bags filled with solution will be connected to the end of the catheter. The solution will be used to irrigate blood from your bladder while you heal.  AFTER THE PROCEDURE You will be taken to the recovery area. Once you are awake, stable, and taking fluids well, you will be taken to your hospital room. Typically, you will stay in the hospital 1 2 days after this procedure. The catheter usually is removed before discharge from the hospital. Document Released: 02/10/2005 Document Revised: 11/05/2011 Document Reviewed: 07/14/2011 [facility] Patient Information 2014 [place], [place]. PATIENT INSTRUCTIONS POST-ANESTHESIA  IMMEDIATELY FOLLOWING SURGERY:  Do not drive or operate machinery for the first twenty four hours after surgery.  Do not make any important decisions for twenty four hours after surgery or while taking narcotic pain medications or sedatives.  If you develop intractable nausea and vomiting or a severe headache please notify your doctor immediately.  FOLLOW-UP:  Please make an appointment with your surgeon as instructed. You do not need to follow up with anesthesia unless specifically instructed to do so.  WOUND CARE INSTRUCTIONS (if applicable):  Keep a dry clean dressing on the anesthesia/puncture wound site if there is drainage.  Once the wound has quit draining you may leave it open to air.  Generally you should leave the bandage intact for twenty four hours unless there is drainage.  If the epidural site drains for more than 36-48 hours please call the anesthesia department.  QUESTIONS?:  Please feel free to call your physician or the hospital operator if you have any questions, and they will be happy to assist you.

## 2013-03-15 NOTE — Pre-Procedure Instructions (Signed)
Dr Patsey Berthold aware of H&H from 01/2013. Wants type and screen drawn with this visit.

## 2013-03-16 NOTE — Pre-Procedure Instructions (Signed)
Dr Patsey Berthold shown H&H of 10.5 and 32.5. No further orders at this time.

## 2013-03-22 ENCOUNTER — Ambulatory Visit (HOSPITAL_COMMUNITY): Payer: Medicaid Other | Admitting: Anesthesiology

## 2013-03-22 ENCOUNTER — Encounter (HOSPITAL_COMMUNITY): Payer: Self-pay | Admitting: *Deleted

## 2013-03-22 ENCOUNTER — Encounter (HOSPITAL_COMMUNITY)
Admission: RE | Disposition: A | Discharge status: Home / Self Care | Payer: Self-pay | Source: Ambulatory Visit | Attending: Urology

## 2013-03-22 ENCOUNTER — Observation Stay (HOSPITAL_COMMUNITY)
Admission: RE | Admit: 2013-03-22 | Discharge: 2013-03-23 | Disposition: A | Discharge status: Home / Self Care | Payer: Medicaid Other | Source: Ambulatory Visit | Attending: Urology | Admitting: Urology

## 2013-03-22 ENCOUNTER — Encounter (HOSPITAL_COMMUNITY): Payer: Medicaid Other | Admitting: Anesthesiology

## 2013-03-22 DIAGNOSIS — Z01812 Encounter for preprocedural laboratory examination: Secondary | ICD-10-CM | POA: Insufficient documentation

## 2013-03-22 DIAGNOSIS — N400 Enlarged prostate without lower urinary tract symptoms: Principal | ICD-10-CM | POA: Insufficient documentation

## 2013-03-22 DIAGNOSIS — N138 Other obstructive and reflux uropathy: Secondary | ICD-10-CM | POA: Diagnosis present

## 2013-03-22 DIAGNOSIS — N401 Enlarged prostate with lower urinary tract symptoms: Secondary | ICD-10-CM | POA: Diagnosis present

## 2013-03-22 HISTORY — PX: TRANSURETHRAL RESECTION OF PROSTATE: SHX73

## 2013-03-22 SURGERY — TURP (TRANSURETHRAL RESECTION OF PROSTATE)
Anesthesia: Spinal | Site: Urethra

## 2013-03-22 MED ORDER — FENTANYL CITRATE 0.05 MG/ML IJ SOLN
INTRAMUSCULAR | Status: DC | PRN
  Administered 2013-03-22: 25 ug via INTRATHECAL
  Administered 2013-03-22 (×3): 25 ug via INTRAVENOUS

## 2013-03-22 MED ORDER — FENTANYL CITRATE 0.05 MG/ML IJ SOLN
INTRAMUSCULAR | Status: AC
  Filled 2013-03-22: qty 2

## 2013-03-22 MED ORDER — EPHEDRINE SULFATE 50 MG/ML IJ SOLN
INTRAMUSCULAR | Status: AC
  Filled 2013-03-22: qty 1

## 2013-03-22 MED ORDER — GLYCINE 1.5 % IR SOLN
Status: DC | PRN
Start: 2013-03-22 — End: 2013-03-22
  Administered 2013-03-22 (×10): 3000 mL

## 2013-03-22 MED ORDER — FENTANYL CITRATE 0.05 MG/ML IJ SOLN
INTRAMUSCULAR | Status: AC
Start: 2013-03-22 — End: 2013-03-22
  Filled 2013-03-22: qty 2

## 2013-03-22 MED ORDER — LACTATED RINGERS IV SOLN
INTRAVENOUS | Status: DC
  Administered 2013-03-22: 1000 mL via INTRAVENOUS

## 2013-03-22 MED ORDER — PROPOFOL 10 MG/ML IV BOLUS
INTRAVENOUS | Status: AC
  Filled 2013-03-22: qty 20

## 2013-03-22 MED ORDER — PROPOFOL INFUSION 10 MG/ML OPTIME
INTRAVENOUS | Status: DC | PRN
  Administered 2013-03-22: 75 ug/kg/min via INTRAVENOUS
  Administered 2013-03-22: 11:00:00 via INTRAVENOUS

## 2013-03-22 MED ORDER — MIDAZOLAM HCL 2 MG/2ML IJ SOLN
1.0000 mg | INTRAMUSCULAR | Status: DC | PRN
  Administered 2013-03-22: 2 mg via INTRAVENOUS

## 2013-03-22 MED ORDER — FENTANYL CITRATE 0.05 MG/ML IJ SOLN
25.0000 ug | INTRAMUSCULAR | Status: AC
  Administered 2013-03-22 (×2): 25 ug via INTRAVENOUS

## 2013-03-22 MED ORDER — PHENYLEPHRINE HCL 10 MG/ML IJ SOLN
INTRAMUSCULAR | Status: AC
Start: 2013-03-22 — End: 2013-03-22
  Filled 2013-03-22: qty 1

## 2013-03-22 MED ORDER — TAMSULOSIN HCL 0.4 MG PO CAPS
0.4000 mg | ORAL_CAPSULE | Freq: Every day | ORAL | Status: DC
  Administered 2013-03-22 – 2013-03-23 (×2): 0.4 mg via ORAL
  Filled 2013-03-22 (×2): qty 1

## 2013-03-22 MED ORDER — SODIUM CHLORIDE BACTERIOSTATIC 0.9 % IJ SOLN
INTRAMUSCULAR | Status: AC
  Filled 2013-03-22: qty 20

## 2013-03-22 MED ORDER — ALBUTEROL SULFATE (2.5 MG/3ML) 0.083% IN NEBU: 3.0000 mL | INHALATION_SOLUTION | RESPIRATORY_TRACT | Status: DC

## 2013-03-22 MED ORDER — DEXTROSE-NACL 5-0.45 % IV SOLN
INTRAVENOUS | Status: DC
  Administered 2013-03-22 – 2013-03-23 (×2): via INTRAVENOUS

## 2013-03-22 MED ORDER — AMLODIPINE BESYLATE 5 MG PO TABS
10.0000 mg | ORAL_TABLET | Freq: Every day | ORAL | Status: DC
  Administered 2013-03-22 – 2013-03-23 (×2): 10 mg via ORAL
  Filled 2013-03-22 (×2): qty 2

## 2013-03-22 MED ORDER — ONDANSETRON HCL 4 MG/2ML IJ SOLN: 4.0000 mg | Freq: Once | INTRAMUSCULAR | Status: DC | PRN

## 2013-03-22 MED ORDER — LACTATED RINGERS IV SOLN
INTRAVENOUS | Status: DC | PRN
  Administered 2013-03-22 (×2): via INTRAVENOUS

## 2013-03-22 MED ORDER — HYDROMORPHONE HCL PF 1 MG/ML IJ SOLN
0.5000 mg | INTRAMUSCULAR | Status: DC | PRN
  Administered 2013-03-22: 0.5 mg via INTRAVENOUS
  Filled 2013-03-22: qty 1

## 2013-03-22 MED ORDER — SODIUM CHLORIDE BACTERIOSTATIC 0.9 % IJ SOLN
INTRAMUSCULAR | Status: AC
  Filled 2013-03-22: qty 10

## 2013-03-22 MED ORDER — CIPROFLOXACIN IN D5W 200 MG/100ML IV SOLN
200.0000 mg | Freq: Two times a day (BID) | INTRAVENOUS | Status: AC
  Administered 2013-03-22: 200 mg via INTRAVENOUS
  Filled 2013-03-22: qty 100

## 2013-03-22 MED ORDER — FENTANYL CITRATE 0.05 MG/ML IJ SOLN: 25.0000 ug | INTRAMUSCULAR | Status: DC | PRN

## 2013-03-22 MED ORDER — BUPIVACAINE IN DEXTROSE 0.75-8.25 % IT SOLN
INTRATHECAL | Status: DC | PRN
  Administered 2013-03-22: 15 mg via INTRATHECAL

## 2013-03-22 MED ORDER — BACITRACIN ZINC 500 UNIT/GM EX OINT
TOPICAL_OINTMENT | CUTANEOUS | Status: AC
  Filled 2013-03-22: qty 0.9

## 2013-03-22 MED ORDER — LIDOCAINE HCL (PF) 1 % IJ SOLN
INTRAMUSCULAR | Status: AC
  Filled 2013-03-22: qty 5

## 2013-03-22 MED ORDER — EPHEDRINE SULFATE 50 MG/ML IJ SOLN
INTRAMUSCULAR | Status: DC | PRN
  Administered 2013-03-22 (×3): 10 mg via INTRAVENOUS

## 2013-03-22 MED ORDER — STERILE WATER FOR IRRIGATION IR SOLN
Status: DC | PRN
  Administered 2013-03-22: 1000 mL

## 2013-03-22 MED ORDER — POLYETHYLENE GLYCOL 3350 17 G PO PACK: 17.0000 g | PACK | Freq: Every day | ORAL | Status: DC | PRN

## 2013-03-22 MED ORDER — CIPROFLOXACIN IN D5W 200 MG/100ML IV SOLN
INTRAVENOUS | Status: AC
  Filled 2013-03-22: qty 100

## 2013-03-22 MED ORDER — BUPIVACAINE IN DEXTROSE 0.75-8.25 % IT SOLN
INTRATHECAL | Status: AC
  Filled 2013-03-22: qty 2

## 2013-03-22 MED ORDER — CIPROFLOXACIN IN D5W 200 MG/100ML IV SOLN
200.0000 mg | Freq: Two times a day (BID) | INTRAVENOUS | Status: DC
  Administered 2013-03-22: 200 mg via INTRAVENOUS

## 2013-03-22 MED ORDER — MIDAZOLAM HCL 2 MG/2ML IJ SOLN
INTRAMUSCULAR | Status: AC
  Filled 2013-03-22: qty 2

## 2013-03-22 MED ORDER — LIDOCAINE HCL (CARDIAC) 10 MG/ML IV SOLN
INTRAVENOUS | Status: DC | PRN
  Administered 2013-03-22: 50 mg via INTRAVENOUS

## 2013-03-22 SURGICAL SUPPLY — 35 items
BAG DECANTER FOR FLEXI CONT (MISCELLANEOUS) ×3 IMPLANT
BAG DRAIN URO TABLE W/ADPT NS (DRAPE) ×3 IMPLANT
BAG DRN 8 ADPR NS SKTRN CSTL (DRAPE) ×1
BAG DRN URN TUBE DRIP CHMBR (OSTOMY) ×1
BAG URINE DRAIN TURP 4L (OSTOMY) ×3 IMPLANT
CABLE HI FREQUENCY MONOPOLAR (ELECTROSURGICAL) ×3 IMPLANT
CATH FOLEY 3WAY 30CC 22F (CATHETERS) ×3 IMPLANT
CLOTH BEACON ORANGE TIMEOUT ST (SAFETY) ×3 IMPLANT
CONNECTOR 5 IN 1 STRAIGHT STRL (MISCELLANEOUS) ×3 IMPLANT
DRAPE STERI URO 23X35 APER SZ5 (DRAPE) ×3 IMPLANT
DRAPE WARM FLUID 44X44 (DRAPE) ×3 IMPLANT
ELECT CUT LOOP C-MAX 27FR .012 (CUTTING LOOP) ×6
ELECT REM PT RETURN 9FT ADLT (ELECTROSURGICAL) ×3
ELECTRODE CUT LP CMX 27FR .012 (CUTTING LOOP) IMPLANT
ELECTRODE REM PT RTRN 9FT ADLT (ELECTROSURGICAL) ×1 IMPLANT
FORMALIN 10 PREFIL 480ML (MISCELLANEOUS) ×3 IMPLANT
GLOVE BIO SURGEON STRL SZ7 (GLOVE) ×3 IMPLANT
GLOVE BIOGEL PI IND STRL 7.0 (GLOVE) IMPLANT
GLOVE BIOGEL PI IND STRL 7.5 (GLOVE) IMPLANT
GLOVE BIOGEL PI INDICATOR 7.0 (GLOVE) ×4
GLOVE BIOGEL PI INDICATOR 7.5 (GLOVE) ×2
GLOVE SS BIOGEL STRL SZ 6.5 (GLOVE) IMPLANT
GLOVE SUPERSENSE BIOGEL SZ 6.5 (GLOVE) ×2
GLYCINE 1.5% IRRIG UROMATIC (IV SOLUTION) ×24 IMPLANT
GOWN STRL REUS W/TWL LRG LVL3 (GOWN DISPOSABLE) ×5 IMPLANT
IV NS IRRIG 3000ML ARTHROMATIC (IV SOLUTION) ×3 IMPLANT
KIT ROOM TURNOVER AP CYSTO (KITS) ×3 IMPLANT
MANIFOLD NEPTUNE II (INSTRUMENTS) ×3 IMPLANT
PACK CYSTO (CUSTOM PROCEDURE TRAY) ×3 IMPLANT
PAD ARMBOARD 7.5X6 YLW CONV (MISCELLANEOUS) ×3 IMPLANT
SET IRRIGATING DISP (SET/KITS/TRAYS/PACK) ×3 IMPLANT
SYR 30ML LL (SYRINGE) ×3 IMPLANT
TOWEL OR 17X26 4PK STRL BLUE (TOWEL DISPOSABLE) ×3 IMPLANT
WATER STERILE IRR 1000ML POUR (IV SOLUTION) ×3 IMPLANT
YANKAUER SUCT BULB TIP 10FT TU (MISCELLANEOUS) ×3 IMPLANT

## 2013-03-22 NOTE — Anesthesia Preprocedure Evaluation (Signed)
Anesthesia Evaluation  Patient identified by MRN, date of birth, ID band Patient awake    Reviewed: Allergy & Precautions, H&P , NPO status , Patient's Chart, lab work & pertinent test results  Airway Mallampati: I TM Distance: >3 FB     Dental  (+) Edentulous Upper and Edentulous Lower   Pulmonary asthma , COPDCurrent Smoker,  breath sounds clear to auscultation        Cardiovascular hypertension, Pt. on medications Rhythm:Regular Rate:Tachycardia     Neuro/Psych  Headaches, PSYCHIATRIC DISORDERS Anxiety Depression    GI/Hepatic GERD-  Medicated and Controlled,(+)     substance abuse (off etoh for 5 months)  alcohol use,   Endo/Other    Renal/GU      Musculoskeletal   Abdominal   Peds  Hematology  (+) Blood dyscrasia (thrombocytopenia), ,   Anesthesia Other Findings   Reproductive/Obstetrics                           Anesthesia Physical Anesthesia Plan  ASA: III  Anesthesia Plan: Spinal   Post-op Pain Management:    Induction:   Airway Management Planned: Nasal Cannula  Additional Equipment:   Intra-op Plan:   Post-operative Plan:   Informed Consent: I have reviewed the patients History and Physical, chart, labs and discussed the procedure including the risks, benefits and alternatives for the proposed anesthesia with the patient or authorized representative who has indicated his/her understanding and acceptance.     Plan Discussed with:   Anesthesia Plan Comments:         Anesthesia Quick Evaluation

## 2013-03-22 NOTE — Brief Op Note (Signed)
03/22/2013  11:34 AM  PATIENT:  Quin Hoop Rode  58 y.o. male  PRE-OPERATIVE DIAGNOSIS:  benign prostatic hypertrophy  POST-OPERATIVE DIAGNOSIS:  benign prostatic hypertrophy  PROCEDURE:  Procedure(s): TRANSURETHRAL RESECTION OF THE PROSTATE (TURP) (N/A)  SURGEON:  Surgeon(s) and Role:    * Marissa Nestle, MD - Primary  PHYSICIAN ASSISTANT:   ASSISTANTS: none   ANESTHESIA:   spinal  EBL:  Total I/O In: 1400 [I.V.:1400] Out: -   BLOOD ADMINISTERED:none  DRAINS: Urinary Catheter (Foley)   LOCAL MEDICATIONS USED:  NONE  SPECIMEN:  Source of Specimen:  prostate chips  DISPOSITION OF SPECIMEN:  PATHOLOGY  COUNTS:  YES  TOURNIQUET:  * No tourniquets in log *  DICTATION: .Other Dictation: Dictation Number dictation [id number]  PLAN OF CARE: Admit for overnight observation  PATIENT DISPOSITION:  PACU - hemodynamically stable.   Delay start of Pharmacological VTE agent (>24hrs) due to surgical blood loss or risk of bleeding:

## 2013-03-22 NOTE — Anesthesia Postprocedure Evaluation (Signed)
  Anesthesia Post-op Note  Patient: Duane Richardson  Procedure(s) Performed: Procedure(s): TRANSURETHRAL RESECTION OF THE PROSTATE (TURP) (N/A)  Patient Location: PACU  Anesthesia Type:Spinal  Level of Consciousness: sedated and patient cooperative  Airway and Oxygen Therapy: Patient Spontanous Breathing and Patient connected to nasal cannula oxygen  Post-op Pain: none  Post-op Assessment: Post-op Vital signs reviewed, Patient's Cardiovascular Status Stable, Respiratory Function Stable, Patent Airway, No signs of Nausea or vomiting and Pain level controlled  Post-op Vital Signs: Reviewed and stable  Complications: No apparent anesthesia complications

## 2013-03-22 NOTE — Preoperative (Signed)
Beta Blockers   Reason not to administer Beta Blockers:Not Applicable 

## 2013-03-22 NOTE — Progress Notes (Signed)
No change in H&P on repeat examination.

## 2013-03-22 NOTE — H&P (Signed)
Duane Richardson, Duane Richardson NO.:  [id number]  MEDICAL RECORD NO.:  [id number]  LOCATION:  DOIB                          FACILITY:  [facility]  PHYSICIAN:  Marissa Nestle, M.D.DATE OF BIRTH:  [birth year]  DATE OF ADMISSION:  03/15/2013 DATE OF DISCHARGE:  01/20/2015LH                             HISTORY & PHYSICAL   A 58 year old gentleman who was evaluated and seen by me recently in [facility]  He is in acute urinary retention.  He was scheduled to have cystoscopy here in the office.  He came on December 18th, we could not do a cystoscopy as he was drunk at that time.  So, I told him to come back when he is not drinking.  He was seen by me in consultation in [facility] on January 29, 2013.  He was having difficulty to void and urinary retention, had a Foley catheter, and his history that he couple months before he fell down and broke his left hip, which was fixed by Dr. Sanjuana Kava.  At that time, he was cystoscoped, but I could not do a cystoscopy because of high blood pressure, and he has a lot of symptoms of prostatism.  Now, he told me that he does not have any blood pressure.  His blood pressure was running fine.  Subsequently, I felt I will do cystoscopy in the office, but it could not be done because he was drunk on that day.  But anyway, he has a Foley catheter.  He was found to be in urinary retention, 1200 mL of urine was removed and after putting the Foley catheter, he felt better.  He was running also temperature of 102.7 with leukocytosis.  He has significant symptoms of prostatism.  Now, he is unable to void.  He was sent home with a Foley catheter so that he can have medical clearance.  He was recently cleared medically, so he is being brought as outpatient to undergo TUR prostate.  I have discussed the procedure limitation, complication of this procedure along with the alternative treatment, so it was decided to go ahead do a TUR  prostate.  PAST MEDICAL HISTORY:  History of having a depression, elevated PSA, arthritis, hyperlipidemia, cataract, alcohol abuse, COPD, asthma, allergies, hypertension history, and GERD.  Also, posteromedial history include left hip repair a couple of months ago.  Last August, he had a colonoscopy done which was okay except diverticulitis.  MEDICATIONS:  He takes Norvasc 10 mg daily, ferrous sulfate 325 mg tablet daily, lorazepam 1 mg p.o. q.6 h. as needed for anxiety, multivitamins, Prilosec, Zoloft, albuterol.  FAMILY HISTORY:  No history of prostate cancer.  PERSONAL HISTORY:  He has been smoking a cigar.  Does not have any smokeless tobacco history.  He drinks about 18 ounces of alcohol per week.  He does not use any illicit drugs.  REVIEW OF SYSTEMS:  Otherwise, unremarkable.  PHYSICAL EXAMINATION:  GENERAL:  Moderately built male, not in acute distress.  Lying comfortably in the bed. CENTRAL NERVOUS SYSTEM:  No gross neurological deficit. HEAD, NECK, ENT:  Negative. CHEST:  Symmetrical.  Normal breath sounds. HEART:  Regular sinus rhythm. ABDOMEN:  Soft, flat.  Liver, spleen, kidneys not palpable. EXTERNAL GENITALIA:  He is uncircumcised.  Foley catheter is in place draining amber-colored urine. RECTAL:  Sphincter tone is normal.  No rectal mass.  Prostate is about 35 g, smooth and firm. VITAL SIGNS:  His blood pressure is 120/76, pulse 116 per minute on that day.  He was running temperature, now his temperature is normal.  IMPRESSION:  Benign prostatic hyperplasia with bladder neck obstruction on cystoscopy.  PLAN:  TUR prostate under anesthesia, then keep him overnight in the hospital.     Marissa Nestle, M.D.     MIJ/MEDQ  D:  03/21/2013  T:  03/22/2013  Job:  [id number]

## 2013-03-22 NOTE — Anesthesia Procedure Notes (Signed)
Spinal  Patient location during procedure: OR Start time: 03/22/2013 10:06 AM Staffing CRNA/Resident: ADAMS, AMY A Preanesthetic Checklist Completed: patient identified, site marked, surgical consent, pre-op evaluation, timeout performed, IV checked, risks and benefits discussed and monitors and equipment checked Spinal Block Patient position: right lateral decubitus Prep: Betadine Patient monitoring: heart rate, cardiac monitor, continuous pulse ox and blood pressure Approach: right paramedian Location: L3-4 Injection technique: single-shot Needle Needle type: Spinocan  Needle gauge: 22 G Needle length: 9 cm Assessment Sensory level: T8 Additional Notes ATTEMPTS:1 TRAY HD:[id number] TRAY EXPIRATION DATE:02/2014 Bupivacaine 15mg  with fentanyl 3mcg injected intrathecally at 1006; Patient tolerated well.

## 2013-03-22 NOTE — Transfer of Care (Signed)
Immediate Anesthesia Transfer of Care Note  Patient: Duane Richardson  Procedure(s) Performed: Procedure(s): TRANSURETHRAL RESECTION OF THE PROSTATE (TURP) (N/A)  Patient Location: PACU  Anesthesia Type:Spinal  Level of Consciousness: sedated and patient cooperative  Airway & Oxygen Therapy: Patient Spontanous Breathing and Patient connected to nasal cannula oxygen  Post-op Assessment: Report given to PACU RN and Post -op Vital signs reviewed and stable  Post vital signs: Reviewed and stable  Complications: No apparent anesthesia complications

## 2013-03-23 ENCOUNTER — Encounter (HOSPITAL_COMMUNITY): Payer: Self-pay | Admitting: Urology

## 2013-03-23 LAB — BASIC METABOLIC PANEL
BUN: 10 mg/dL (ref 6–23)
CO2: 24 mEq/L (ref 19–32)
Calcium: 8.9 mg/dL (ref 8.4–10.5)
Chloride: 101 mEq/L (ref 96–112)
Creatinine, Ser: 0.64 mg/dL (ref 0.50–1.35)
GFR calc Af Amer: >90 mL/min (ref >90)
GFR calc non Af Amer: >90 mL/min (ref >90)
Glucose, Bld: 125 mg/dL — ABNORMAL HIGH (ref 70–99)
Potassium: 3.7 mEq/L (ref 3.7–5.3)
Sodium: 136 mEq/L — ABNORMAL LOW (ref 137–147)

## 2013-03-23 LAB — CBC
HCT: 30 % — ABNORMAL LOW (ref 39.0–52.0)
Hemoglobin: 10 g/dL — ABNORMAL LOW (ref 13.0–17.0)
MCH: 27.7 pg (ref 26.0–34.0)
MCHC: 33.3 g/dL (ref 30.0–36.0)
MCV: 83.1 fL (ref 78.0–100.0)
Platelets: 250 10*3/uL (ref 150–400)
RBC: 3.61 MIL/uL — ABNORMAL LOW (ref 4.22–5.81)
RDW: 15.9 % — ABNORMAL HIGH (ref 11.5–15.5)
WBC: 5.7 10*3/uL (ref 4.0–10.5)

## 2013-03-23 LAB — URINE CULTURE: Colony Count: >=100000

## 2013-03-23 MED ORDER — SODIUM CHLORIDE 0.9 % IR SOLN
3000.0000 mL | Status: DC
  Administered 2013-03-22 – 2013-03-23 (×6): 3000 mL

## 2013-03-23 MED ORDER — ENSURE COMPLETE PO LIQD: 237.0000 mL | Freq: Two times a day (BID) | ORAL | Status: DC

## 2013-03-23 MED ORDER — SODIUM CHLORIDE 0.9 % IR SOLN
Status: DC | PRN
  Administered 2013-03-22: 3000 mL

## 2013-03-23 NOTE — Progress Notes (Signed)
Discharge instructions given on medications,and follow up visits,patient verbalized understanding.No c/o pain or discomfort noted.Accompanied by staff to an awaiting vehicle.Patient discharged with foley per Dr Delton Coombes orders,patient instructed on foley catheter care,informed me that he had a foley before at home.

## 2013-03-23 NOTE — Progress Notes (Signed)
Afebrile doing fine abdomen soft cbi clear plan oob d/c cbi discharge home with foley catheter will see in office Monday to take foley out.

## 2013-03-23 NOTE — Op Note (Signed)
NAMETULIO, FACUNDO NO.:  [id number]  MEDICAL RECORD NO.:  [id number]  LOCATION:  M076                          FACILITY:  [facility]  PHYSICIAN:  Marissa Nestle, M.D.DATE OF BIRTH:  [birth year]  DATE OF PROCEDURE:  03/22/2013 DATE OF DISCHARGE:                              OPERATIVE REPORT   PREOPERATIVE DIAGNOSIS:  Acute urinary retention, benign prostatic hypertrophy.  POSTOPERATIVE DIAGNOSIS:  Acute urinary retention, benign prostatic hypertrophy.  PROCEDURE:  Transurethral resection of the prostate.  ANESTHESIA:  Spinal.  PROCEDURE:  The patient under spinal anesthesia in lithotomy position. After usual prep and drape, a #28 Iglesias resectoscope was introduced into the bladder.  Once again, it was inspected.  Prostatic urethra was completely obstructed with trilobar hypertrophy as a well-developed median lobe.  Then resectoscope was pulled back in mid prostatic urethra.  The median lobe was completely resected up to the verumontanum.  Then, the bladder neck was circumferentially dissected down to the circular fibers.  Bleeders were coagulated, chips were evacuated.  At this point, the resectoscope was pulled back at the level of the verumontanum, rotated to 11 o'clock position.  Resection of the right lobe was started between 11 and 7 o'clock positions.  Similarly, the left lobe was resected between 1 and 5 o'clock positions.  The small amount of anterior midline tissue was resected and there was a large amount of tissue in the posterior midline, which was resected very carefully not to injure the sphincter of the verumontanum.  Prostatic urethra looks open.  There is no visual obstruction and chips were evacuated.  Bleeders were coagulated.  There is still tissue in the lateral lobes, but there is no obstruction, so decided to quit the procedure.  At this point, the resectoscope was removed, 22 three-way Foley catheter inserted, CBI started, which is  clear.  The patient left the operating room in a satisfactory condition.     Marissa Nestle, M.D.     MIJ/MEDQ  D:  03/22/2013  T:  03/23/2013  Job:  [id number]

## 2013-03-23 NOTE — Discharge Instructions (Signed)
Call if any bleeding or fever101F

## 2013-03-23 NOTE — Anesthesia Postprocedure Evaluation (Signed)
Anesthesia Post Note  Patient: Duane Richardson  Procedure(s) Performed: Procedure(s) (LRB): TRANSURETHRAL RESECTION OF THE PROSTATE (TURP) (N/A)  Anesthesia type: Spinal  Patient location: 338  Post pain: Pain level controlled  Post assessment: Post-op Vital signs reviewed, Patient's Cardiovascular Status Stable, Respiratory Function Stable, Patent Airway, No signs of Nausea or vomiting and Pain level controlled  Last Vitals:  Filed Vitals:   03/23/13 1148  BP: 105/69  Pulse: 110  Temp: 36.8 C  Resp: 20    Post vital signs: Reviewed and stable  Level of consciousness: awake and alert   Complications: No apparent anesthesia complications

## 2013-03-23 NOTE — Plan of Care (Signed)
Problem: COPD GOLD Progrssion Goal: Provide COPD GOLD Action Plan at Discharge Outcome: Progressing Spoke with Mr,Duane Richardson over the phone,regarding the Copd Gold action plan,Gold card to be mailed to his home.PCP to make arrangements regarding Pulmonary Clinic,patient verbalized understanding. Goal: Arrange DC Appointments for Pulmonary Clinic & PCP Arrange Discharge Appointments for Pulmonary Clinic and Primary Care Physician  Outcome: Completed/Met Date Met:  03/23/13 PCP Goal: Identify Patient Has COPD GOLD Card Identify Patient has COPD GOLD Card (if not, issue card to patient)  Outcome: Progressing Copd Gold card to be mailed to patient's home.
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
## 2013-03-31 ENCOUNTER — Encounter: Payer: Self-pay | Admitting: Family Medicine

## 2013-03-31 ENCOUNTER — Ambulatory Visit (INDEPENDENT_AMBULATORY_CARE_PROVIDER_SITE_OTHER): Payer: Medicaid Other | Admitting: Family Medicine

## 2013-03-31 VITALS — BP 110/78 | HR 80 | Temp 98.8°F | Resp 16 | Ht 73.0 in | Wt 150.0 lb

## 2013-03-31 DIAGNOSIS — M7989 Other specified soft tissue disorders: Secondary | ICD-10-CM

## 2013-03-31 DIAGNOSIS — L0390 Cellulitis, unspecified: Secondary | ICD-10-CM

## 2013-03-31 DIAGNOSIS — L0291 Cutaneous abscess, unspecified: Secondary | ICD-10-CM

## 2013-03-31 MED ORDER — CEPHALEXIN 500 MG PO CAPS: 500.0000 mg | ORAL_CAPSULE | Freq: Three times a day (TID) | ORAL | Status: DC
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
## 2013-04-05 ENCOUNTER — Ambulatory Visit
Admission: RE | Admit: 2013-04-05 | Discharge: 2013-04-05 | Disposition: A | Discharge status: Home / Self Care | Payer: No Typology Code available for payment source | Source: Ambulatory Visit | Attending: Family Medicine | Admitting: Family Medicine

## 2013-04-05 DIAGNOSIS — M7989 Other specified soft tissue disorders: Secondary | ICD-10-CM

## 2013-04-08 ENCOUNTER — Encounter: Payer: Self-pay | Admitting: Family Medicine

## 2013-04-08 ENCOUNTER — Ambulatory Visit (INDEPENDENT_AMBULATORY_CARE_PROVIDER_SITE_OTHER): Payer: Medicaid Other | Admitting: Family Medicine

## 2013-04-08 VITALS — BP 104/74 | HR 82 | Temp 97.2°F | Resp 16 | Ht 73.0 in | Wt 153.0 lb

## 2013-04-08 DIAGNOSIS — M7989 Other specified soft tissue disorders: Secondary | ICD-10-CM
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
## 2013-05-23 ENCOUNTER — Encounter (HOSPITAL_COMMUNITY): Payer: Self-pay

## 2013-06-02 NOTE — Pre-Procedure Instructions (Signed)
[facility]  06/02/2013   Your procedure is scheduled on: Tuesday, April 14th     Report to [facility]  2 * 3 at  10:30 AM.  Call this number if you have problems the morning of surgery: [phone]   Remember:   Do not eat food or drink liquids after midnight Monday.   Take these medicines the morning of surgery with A SIP OF WATER:  Amlodipine, Omeprazole, Zoloft, Albuterol   Do not wear jewelry - no rings or watches.   Do not wear lotions or colognes. You may NOT` wear deodorant.   Men may shave face and neck.   Do not bring valuables to the hospital.  [facility] is not responsible for any belongings or valuables.               Contacts, dentures or bridgework may not be worn into surgery.  Leave suitcase in the car. After surgery it may be brought to your room.  For patients admitted to the hospital, discharge time is determined by your treatment team.    Name and phone number of your driver:    Special Instructions: "Preparing for Surgery" instruction sheet.   Please read over the following fact sheets that you were given: Pain Booklet and Surgical Site Infection Prevention

## 2013-06-03 ENCOUNTER — Other Ambulatory Visit: Payer: Self-pay | Admitting: Physician Assistant

## 2013-06-03 ENCOUNTER — Encounter (HOSPITAL_COMMUNITY): Payer: Self-pay

## 2013-06-03 ENCOUNTER — Encounter (HOSPITAL_COMMUNITY)
Admission: RE | Admit: 2013-06-03 | Discharge: 2013-06-03 | Disposition: A | Discharge status: Home / Self Care | Payer: Medicaid Other | Source: Ambulatory Visit | Attending: Orthopaedic Surgery | Admitting: Orthopaedic Surgery

## 2013-06-03 DIAGNOSIS — Z01812 Encounter for preprocedural laboratory examination: Secondary | ICD-10-CM | POA: Insufficient documentation

## 2013-06-03 LAB — URINALYSIS, ROUTINE W REFLEX MICROSCOPIC
Bilirubin Urine: NEGATIVE
Glucose, UA: NEGATIVE mg/dL
Hgb urine dipstick: NEGATIVE
Ketones, ur: NEGATIVE mg/dL
Leukocytes, UA: NEGATIVE
Nitrite: NEGATIVE
Protein, ur: NEGATIVE mg/dL
Specific Gravity, Urine: 1.027 (ref 1.005–1.030)
Urobilinogen, UA: 1 mg/dL (ref 0.0–1.0)
pH: 6.5 (ref 5.0–8.0)

## 2013-06-03 LAB — ABO/RH: ABO/RH(D): A POS

## 2013-06-03 LAB — CBC
HCT: 43.6 % (ref 39.0–52.0)
Hemoglobin: 14.9 g/dL (ref 13.0–17.0)
MCH: 29.2 pg (ref 26.0–34.0)
MCHC: 34.2 g/dL (ref 30.0–36.0)
MCV: 85.3 fL (ref 78.0–100.0)
Platelets: 192 10*3/uL (ref 150–400)
RBC: 5.11 MIL/uL (ref 4.22–5.81)
RDW: 16.3 % — ABNORMAL HIGH (ref 11.5–15.5)
WBC: 4.7 10*3/uL (ref 4.0–10.5)

## 2013-06-03 LAB — SURGICAL PCR SCREEN
MRSA, PCR: NEGATIVE
Staphylococcus aureus: NEGATIVE

## 2013-06-03 LAB — BASIC METABOLIC PANEL
BUN: 12 mg/dL (ref 6–23)
CO2: 23 mEq/L (ref 19–32)
Calcium: 9.5 mg/dL (ref 8.4–10.5)
Chloride: 100 mEq/L (ref 96–112)
Creatinine, Ser: 0.78 mg/dL (ref 0.50–1.35)
GFR calc Af Amer: >90 mL/min (ref >90)
GFR calc non Af Amer: >90 mL/min (ref >90)
Glucose, Bld: 84 mg/dL (ref 70–99)
Potassium: 4.5 mEq/L (ref 3.7–5.3)
Sodium: 138 mEq/L (ref 137–147)

## 2013-06-03 NOTE — Telephone Encounter (Signed)
Medication refilled per protocol. 

## 2013-06-06 MED ORDER — CEFAZOLIN SODIUM-DEXTROSE 2-3 GM-% IV SOLR
2.0000 g | INTRAVENOUS | Status: AC
  Administered 2013-06-07: 2 g via INTRAVENOUS
  Filled 2013-06-06: qty 50

## 2013-06-07 ENCOUNTER — Inpatient Hospital Stay (HOSPITAL_COMMUNITY): Payer: Medicaid Other

## 2013-06-07 ENCOUNTER — Encounter (HOSPITAL_COMMUNITY): Payer: Self-pay | Admitting: Critical Care Medicine

## 2013-06-07 ENCOUNTER — Encounter (HOSPITAL_COMMUNITY)
Admission: RE | Disposition: A | Discharge status: Home Health | Payer: Self-pay | Source: Ambulatory Visit | Attending: Orthopaedic Surgery

## 2013-06-07 ENCOUNTER — Ambulatory Visit (HOSPITAL_COMMUNITY): Payer: Medicaid Other

## 2013-06-07 ENCOUNTER — Encounter (HOSPITAL_COMMUNITY): Payer: Medicaid Other | Admitting: Critical Care Medicine

## 2013-06-07 ENCOUNTER — Inpatient Hospital Stay (HOSPITAL_COMMUNITY)
Admission: RE | Admit: 2013-06-07 | Discharge: 2013-06-10 | DRG: 470 | Disposition: A | Discharge status: Home Health | Payer: Medicaid Other | Source: Ambulatory Visit | Attending: Orthopaedic Surgery | Admitting: Orthopaedic Surgery

## 2013-06-07 ENCOUNTER — Ambulatory Visit (HOSPITAL_COMMUNITY): Payer: Medicaid Other | Admitting: Critical Care Medicine

## 2013-06-07 DIAGNOSIS — F17200 Nicotine dependence, unspecified, uncomplicated: Secondary | ICD-10-CM | POA: Diagnosis present

## 2013-06-07 DIAGNOSIS — K219 Gastro-esophageal reflux disease without esophagitis: Secondary | ICD-10-CM | POA: Diagnosis present

## 2013-06-07 DIAGNOSIS — I10 Essential (primary) hypertension: Secondary | ICD-10-CM | POA: Diagnosis present

## 2013-06-07 DIAGNOSIS — Z96649 Presence of unspecified artificial hip joint: Secondary | ICD-10-CM

## 2013-06-07 DIAGNOSIS — F411 Generalized anxiety disorder: Secondary | ICD-10-CM | POA: Diagnosis present

## 2013-06-07 DIAGNOSIS — M87059 Idiopathic aseptic necrosis of unspecified femur: Principal | ICD-10-CM | POA: Diagnosis present

## 2013-06-07 DIAGNOSIS — F1010 Alcohol abuse, uncomplicated: Secondary | ICD-10-CM | POA: Diagnosis present

## 2013-06-07 DIAGNOSIS — J4489 Other specified chronic obstructive pulmonary disease: Secondary | ICD-10-CM | POA: Diagnosis present

## 2013-06-07 DIAGNOSIS — M1612 Unilateral primary osteoarthritis, left hip: Secondary | ICD-10-CM

## 2013-06-07 DIAGNOSIS — D62 Acute posthemorrhagic anemia: Secondary | ICD-10-CM | POA: Diagnosis not present

## 2013-06-07 DIAGNOSIS — F319 Bipolar disorder, unspecified: Secondary | ICD-10-CM | POA: Diagnosis present

## 2013-06-07 DIAGNOSIS — M1610 Unilateral primary osteoarthritis, unspecified hip: Secondary | ICD-10-CM | POA: Diagnosis present

## 2013-06-07 DIAGNOSIS — E785 Hyperlipidemia, unspecified: Secondary | ICD-10-CM | POA: Diagnosis present

## 2013-06-07 DIAGNOSIS — M169 Osteoarthritis of hip, unspecified: Secondary | ICD-10-CM | POA: Diagnosis present

## 2013-06-07 DIAGNOSIS — Z79899 Other long term (current) drug therapy: Secondary | ICD-10-CM

## 2013-06-07 DIAGNOSIS — J449 Chronic obstructive pulmonary disease, unspecified: Secondary | ICD-10-CM | POA: Diagnosis present

## 2013-06-07 DIAGNOSIS — Z472 Encounter for removal of internal fixation device: Secondary | ICD-10-CM

## 2013-06-07 HISTORY — PX: TOTAL HIP ARTHROPLASTY: SHX124

## 2013-06-07 HISTORY — PX: HARDWARE REMOVAL: SHX979

## 2013-06-07 SURGERY — ARTHROPLASTY, HIP, TOTAL, ANTERIOR APPROACH
Anesthesia: General | Site: Hip | Laterality: Left

## 2013-06-07 MED ORDER — METOCLOPRAMIDE HCL 10 MG PO TABS: 5.0000 mg | ORAL_TABLET | Freq: Three times a day (TID) | ORAL | Status: DC | PRN

## 2013-06-07 MED ORDER — FERROUS SULFATE 325 (65 FE) MG PO TABS
325.0000 mg | ORAL_TABLET | Freq: Three times a day (TID) | ORAL | Status: DC
  Administered 2013-06-08 – 2013-06-10 (×8): 325 mg via ORAL
  Filled 2013-06-07 (×10): qty 1

## 2013-06-07 MED ORDER — HYDROMORPHONE HCL PF 1 MG/ML IJ SOLN
1.0000 mg | INTRAMUSCULAR | Status: DC | PRN
  Administered 2013-06-08: 1 mg via INTRAVENOUS
  Filled 2013-06-07: qty 1

## 2013-06-07 MED ORDER — DOCUSATE SODIUM 100 MG PO CAPS
100.0000 mg | ORAL_CAPSULE | Freq: Two times a day (BID) | ORAL | Status: DC
  Administered 2013-06-07 – 2013-06-09 (×3): 100 mg via ORAL
  Filled 2013-06-07 (×7): qty 1

## 2013-06-07 MED ORDER — METHOCARBAMOL 100 MG/ML IJ SOLN
500.0000 mg | Freq: Four times a day (QID) | INTRAVENOUS | Status: DC | PRN
  Filled 2013-06-07: qty 5

## 2013-06-07 MED ORDER — MIDAZOLAM HCL 5 MG/5ML IJ SOLN
INTRAMUSCULAR | Status: DC | PRN
  Administered 2013-06-07 (×2): 2 mg via INTRAVENOUS

## 2013-06-07 MED ORDER — METHOCARBAMOL 500 MG PO TABS
ORAL_TABLET | ORAL | Status: AC
  Filled 2013-06-07: qty 1

## 2013-06-07 MED ORDER — MEPERIDINE HCL 25 MG/ML IJ SOLN: 6.2500 mg | INTRAMUSCULAR | Status: DC | PRN

## 2013-06-07 MED ORDER — TRANEXAMIC ACID 100 MG/ML IV SOLN
1000.0000 mg | INTRAVENOUS | Status: AC
  Administered 2013-06-07: 1000 mg via INTRAVENOUS
  Filled 2013-06-07: qty 10

## 2013-06-07 MED ORDER — SUCCINYLCHOLINE CHLORIDE 20 MG/ML IJ SOLN
INTRAMUSCULAR | Status: DC | PRN
  Administered 2013-06-07: 80 mg via INTRAVENOUS

## 2013-06-07 MED ORDER — PHENYLEPHRINE HCL 10 MG/ML IJ SOLN
INTRAMUSCULAR | Status: AC
  Filled 2013-06-07: qty 1

## 2013-06-07 MED ORDER — GLYCOPYRROLATE 0.2 MG/ML IJ SOLN
INTRAMUSCULAR | Status: AC
  Filled 2013-06-07: qty 3

## 2013-06-07 MED ORDER — ONDANSETRON HCL 4 MG PO TABS: 4.0000 mg | ORAL_TABLET | Freq: Four times a day (QID) | ORAL | Status: DC | PRN

## 2013-06-07 MED ORDER — ALBUTEROL SULFATE HFA 108 (90 BASE) MCG/ACT IN AERS: 2.0000 | INHALATION_SPRAY | Freq: Four times a day (QID) | RESPIRATORY_TRACT | Status: DC | PRN

## 2013-06-07 MED ORDER — MIDAZOLAM HCL 2 MG/2ML IJ SOLN
INTRAMUSCULAR | Status: AC
  Filled 2013-06-07: qty 2

## 2013-06-07 MED ORDER — PHENYLEPHRINE HCL 10 MG/ML IJ SOLN
INTRAMUSCULAR | Status: DC | PRN
  Administered 2013-06-07: 40 ug via INTRAVENOUS
  Administered 2013-06-07 (×2): 80 ug via INTRAVENOUS
  Administered 2013-06-07: 120 ug via INTRAVENOUS

## 2013-06-07 MED ORDER — DIPHENHYDRAMINE HCL 12.5 MG/5ML PO ELIX: 12.5000 mg | ORAL_SOLUTION | ORAL | Status: DC | PRN

## 2013-06-07 MED ORDER — SERTRALINE HCL 50 MG PO TABS
50.0000 mg | ORAL_TABLET | Freq: Three times a day (TID) | ORAL | Status: DC
  Administered 2013-06-07 – 2013-06-10 (×7): 50 mg via ORAL
  Filled 2013-06-07 (×11): qty 1

## 2013-06-07 MED ORDER — PHENYLEPHRINE 40 MCG/ML (10ML) SYRINGE FOR IV PUSH (FOR BLOOD PRESSURE SUPPORT)
PREFILLED_SYRINGE | INTRAVENOUS | Status: AC
  Filled 2013-06-07: qty 10

## 2013-06-07 MED ORDER — PHENYLEPHRINE HCL 10 MG/ML IJ SOLN
10.0000 mg | INTRAVENOUS | Status: DC | PRN
  Administered 2013-06-07: 25 ug/min via INTRAVENOUS

## 2013-06-07 MED ORDER — PROPOFOL 10 MG/ML IV BOLUS
INTRAVENOUS | Status: DC | PRN
  Administered 2013-06-07: 200 mg via INTRAVENOUS

## 2013-06-07 MED ORDER — NEOSTIGMINE METHYLSULFATE 1 MG/ML IJ SOLN
INTRAMUSCULAR | Status: DC | PRN
  Administered 2013-06-07: 4 mg via INTRAVENOUS

## 2013-06-07 MED ORDER — ACETAMINOPHEN 650 MG RE SUPP: 650.0000 mg | Freq: Four times a day (QID) | RECTAL | Status: DC | PRN

## 2013-06-07 MED ORDER — OXYCODONE HCL 5 MG/5ML PO SOLN: 5.0000 mg | Freq: Once | ORAL | Status: DC | PRN

## 2013-06-07 MED ORDER — ASPIRIN EC 325 MG PO TBEC
325.0000 mg | DELAYED_RELEASE_TABLET | Freq: Two times a day (BID) | ORAL | Status: DC
  Administered 2013-06-08 – 2013-06-10 (×5): 325 mg via ORAL
  Filled 2013-06-07 (×7): qty 1

## 2013-06-07 MED ORDER — SODIUM CHLORIDE 0.9 % IV SOLN
INTRAVENOUS | Status: DC
  Administered 2013-06-07 – 2013-06-09 (×3): via INTRAVENOUS

## 2013-06-07 MED ORDER — AMLODIPINE BESYLATE 10 MG PO TABS
10.0000 mg | ORAL_TABLET | Freq: Every day | ORAL | Status: DC
  Administered 2013-06-08 – 2013-06-10 (×3): 10 mg via ORAL
  Filled 2013-06-07 (×3): qty 1

## 2013-06-07 MED ORDER — HYDROMORPHONE HCL PF 1 MG/ML IJ SOLN
INTRAMUSCULAR | Status: AC
  Administered 2013-06-07: 0.5 mg via INTRAVENOUS
  Filled 2013-06-07: qty 2

## 2013-06-07 MED ORDER — OXYCODONE HCL 5 MG PO TABS
5.0000 mg | ORAL_TABLET | ORAL | Status: DC | PRN
  Administered 2013-06-07 (×2): 10 mg via ORAL
  Administered 2013-06-08: 5 mg via ORAL
  Administered 2013-06-08 (×2): 10 mg via ORAL
  Administered 2013-06-09: 5 mg via ORAL
  Administered 2013-06-10: 10 mg via ORAL
  Filled 2013-06-07 (×2): qty 2
  Filled 2013-06-07: qty 1
  Filled 2013-06-07 (×2): qty 2
  Filled 2013-06-07: qty 1

## 2013-06-07 MED ORDER — MENTHOL 3 MG MT LOZG: 1.0000 | LOZENGE | OROMUCOSAL | Status: DC | PRN

## 2013-06-07 MED ORDER — CEFAZOLIN SODIUM 1-5 GM-% IV SOLN
1.0000 g | Freq: Four times a day (QID) | INTRAVENOUS | Status: AC
  Administered 2013-06-07 – 2013-06-08 (×2): 1 g via INTRAVENOUS
  Filled 2013-06-07 (×2): qty 50

## 2013-06-07 MED ORDER — 0.9 % SODIUM CHLORIDE (POUR BTL) OPTIME
TOPICAL | Status: DC | PRN
  Administered 2013-06-07: 1000 mL

## 2013-06-07 MED ORDER — ROCURONIUM BROMIDE 50 MG/5ML IV SOLN
INTRAVENOUS | Status: AC
  Filled 2013-06-07: qty 1

## 2013-06-07 MED ORDER — PROPOFOL 10 MG/ML IV BOLUS
INTRAVENOUS | Status: AC
  Filled 2013-06-07: qty 20

## 2013-06-07 MED ORDER — ONDANSETRON HCL 4 MG/2ML IJ SOLN
INTRAMUSCULAR | Status: DC | PRN
  Administered 2013-06-07: 4 mg via INTRAVENOUS

## 2013-06-07 MED ORDER — FENTANYL CITRATE 0.05 MG/ML IJ SOLN
INTRAMUSCULAR | Status: AC
  Filled 2013-06-07: qty 5

## 2013-06-07 MED ORDER — NEOSTIGMINE METHYLSULFATE 1 MG/ML IJ SOLN
INTRAMUSCULAR | Status: AC
  Filled 2013-06-07: qty 10

## 2013-06-07 MED ORDER — ALBUMIN HUMAN 5 % IV SOLN
INTRAVENOUS | Status: DC | PRN
  Administered 2013-06-07 (×3): via INTRAVENOUS

## 2013-06-07 MED ORDER — PANTOPRAZOLE SODIUM 40 MG PO TBEC
40.0000 mg | DELAYED_RELEASE_TABLET | Freq: Every day | ORAL | Status: DC
  Administered 2013-06-07 – 2013-06-10 (×4): 40 mg via ORAL
  Filled 2013-06-07 (×3): qty 1

## 2013-06-07 MED ORDER — ALUM & MAG HYDROXIDE-SIMETH 200-200-20 MG/5ML PO SUSP: 30.0000 mL | ORAL | Status: DC | PRN

## 2013-06-07 MED ORDER — ACETAMINOPHEN 325 MG PO TABS: 650.0000 mg | ORAL_TABLET | Freq: Four times a day (QID) | ORAL | Status: DC | PRN

## 2013-06-07 MED ORDER — ONDANSETRON HCL 4 MG/2ML IJ SOLN
INTRAMUSCULAR | Status: AC
  Filled 2013-06-07: qty 2

## 2013-06-07 MED ORDER — LIDOCAINE HCL (CARDIAC) 20 MG/ML IV SOLN
INTRAVENOUS | Status: DC | PRN
  Administered 2013-06-07: 70 mg via INTRAVENOUS
  Administered 2013-06-07 (×2): 10 mg via INTRAVENOUS

## 2013-06-07 MED ORDER — GLYCOPYRROLATE 0.2 MG/ML IJ SOLN
INTRAMUSCULAR | Status: DC | PRN
  Administered 2013-06-07: 0.6 mg via INTRAVENOUS

## 2013-06-07 MED ORDER — OXYCODONE HCL 5 MG PO TABS
ORAL_TABLET | ORAL | Status: AC
  Filled 2013-06-07: qty 2

## 2013-06-07 MED ORDER — POLYETHYLENE GLYCOL 3350 17 G PO PACK: 17.0000 g | PACK | Freq: Every day | ORAL | Status: DC | PRN

## 2013-06-07 MED ORDER — ONDANSETRON HCL 4 MG/2ML IJ SOLN: 4.0000 mg | Freq: Four times a day (QID) | INTRAMUSCULAR | Status: DC | PRN

## 2013-06-07 MED ORDER — ARTIFICIAL TEARS OP OINT
TOPICAL_OINTMENT | OPHTHALMIC | Status: AC
  Filled 2013-06-07: qty 3.5

## 2013-06-07 MED ORDER — HYDROMORPHONE HCL PF 1 MG/ML IJ SOLN
0.2500 mg | INTRAMUSCULAR | Status: DC | PRN
  Administered 2013-06-07 (×2): 0.5 mg via INTRAVENOUS

## 2013-06-07 MED ORDER — LACTATED RINGERS IV SOLN
INTRAVENOUS | Status: DC
  Administered 2013-06-07 (×4): via INTRAVENOUS

## 2013-06-07 MED ORDER — METOCLOPRAMIDE HCL 5 MG/ML IJ SOLN
5.0000 mg | Freq: Three times a day (TID) | INTRAMUSCULAR | Status: DC | PRN
Start: 2013-06-07 — End: 2013-06-10

## 2013-06-07 MED ORDER — PHENOL 1.4 % MT LIQD: 1.0000 | OROMUCOSAL | Status: DC | PRN

## 2013-06-07 MED ORDER — FENTANYL CITRATE 0.05 MG/ML IJ SOLN
INTRAMUSCULAR | Status: DC | PRN
  Administered 2013-06-07: 100 ug via INTRAVENOUS
  Administered 2013-06-07 (×3): 50 ug via INTRAVENOUS
  Administered 2013-06-07: 25 ug via INTRAVENOUS
  Administered 2013-06-07: 50 ug via INTRAVENOUS
  Administered 2013-06-07: 25 ug via INTRAVENOUS
  Administered 2013-06-07 (×2): 50 ug via INTRAVENOUS
  Administered 2013-06-07: 25 ug via INTRAVENOUS
  Administered 2013-06-07 (×5): 50 ug via INTRAVENOUS
  Administered 2013-06-07: 25 ug via INTRAVENOUS

## 2013-06-07 MED ORDER — MIDAZOLAM HCL 2 MG/2ML IJ SOLN: 0.5000 mg | Freq: Once | INTRAMUSCULAR | Status: DC | PRN

## 2013-06-07 MED ORDER — ALBUTEROL SULFATE (2.5 MG/3ML) 0.083% IN NEBU: 2.5000 mg | INHALATION_SOLUTION | Freq: Four times a day (QID) | RESPIRATORY_TRACT | Status: DC | PRN

## 2013-06-07 MED ORDER — OXYCODONE HCL 5 MG PO TABS: 5.0000 mg | ORAL_TABLET | Freq: Once | ORAL | Status: DC | PRN

## 2013-06-07 MED ORDER — PROMETHAZINE HCL 25 MG/ML IJ SOLN: 6.2500 mg | INTRAMUSCULAR | Status: DC | PRN

## 2013-06-07 MED ORDER — METHOCARBAMOL 500 MG PO TABS
500.0000 mg | ORAL_TABLET | Freq: Four times a day (QID) | ORAL | Status: DC | PRN
Start: 2013-06-07 — End: 2013-06-10
  Administered 2013-06-07 – 2013-06-08 (×2): 500 mg via ORAL
  Filled 2013-06-07 (×2): qty 1

## 2013-06-07 MED ORDER — TAMSULOSIN HCL 0.4 MG PO CAPS
0.4000 mg | ORAL_CAPSULE | Freq: Every day | ORAL | Status: DC
  Administered 2013-06-07 – 2013-06-10 (×4): 0.4 mg via ORAL
  Filled 2013-06-07 (×4): qty 1

## 2013-06-07 MED ORDER — LIDOCAINE HCL (CARDIAC) 20 MG/ML IV SOLN
INTRAVENOUS | Status: AC
  Filled 2013-06-07: qty 5

## 2013-06-07 SURGICAL SUPPLY — 64 items
APL SKNCLS STERI-STRIP NONHPOA (GAUZE/BANDAGES/DRESSINGS) ×1
BANDAGE GAUZE ELAST BULKY 4 IN (GAUZE/BANDAGES/DRESSINGS) ×1 IMPLANT
BENZOIN TINCTURE PRP APPL 2/3 (GAUZE/BANDAGES/DRESSINGS) ×3 IMPLANT
BLADE SAW SGTL 18X1.27X75 (BLADE) ×2 IMPLANT
BLADE SAW SGTL 18X1.27X75MM (BLADE) ×1
BLADE SURG 10 STRL SS (BLADE) ×2 IMPLANT
CAPT HIP PF COP ×2 IMPLANT
CELLS DAT CNTRL 66122 CELL SVR (MISCELLANEOUS) ×1 IMPLANT
CLOSURE WOUND 1/2 X4 (GAUZE/BANDAGES/DRESSINGS) ×2
COVER SURGICAL LIGHT HANDLE (MISCELLANEOUS) ×3 IMPLANT
DRAPE C-ARM 42X72 X-RAY (DRAPES) ×3 IMPLANT
DRAPE STERI IOBAN 125X83 (DRAPES) ×3 IMPLANT
DRAPE U-SHAPE 47X51 STRL (DRAPES) ×9 IMPLANT
DRSG AQUACEL AG ADV 3.5X10 (GAUZE/BANDAGES/DRESSINGS) ×5 IMPLANT
DRSG EMULSION OIL 3X3 NADH (GAUZE/BANDAGES/DRESSINGS) ×3 IMPLANT
DURAPREP 26ML APPLICATOR (WOUND CARE) ×3 IMPLANT
ELECT BLADE 4.0 EZ CLEAN MEGAD (MISCELLANEOUS)
ELECT BLADE 6.5 EXT (BLADE) IMPLANT
ELECT CAUTERY BLADE 6.4 (BLADE) ×3 IMPLANT
ELECT REM PT RETURN 9FT ADLT (ELECTROSURGICAL) ×3
ELECTRODE BLDE 4.0 EZ CLN MEGD (MISCELLANEOUS) IMPLANT
ELECTRODE REM PT RTRN 9FT ADLT (ELECTROSURGICAL) ×1 IMPLANT
FACESHIELD WRAPAROUND (MASK) ×6 IMPLANT
FACESHIELD WRAPAROUND OR TEAM (MASK) ×2 IMPLANT
GAUZE XEROFORM 5X9 LF (GAUZE/BANDAGES/DRESSINGS) ×4 IMPLANT
GLOVE BIO SURGEON STRL SZ8 (GLOVE) ×3 IMPLANT
GLOVE BIOGEL PI IND STRL 8 (GLOVE) ×2 IMPLANT
GLOVE BIOGEL PI INDICATOR 8 (GLOVE) ×4
GLOVE ECLIPSE 8.0 STRL XLNG CF (GLOVE) ×3 IMPLANT
GLOVE ORTHO TXT STRL SZ7.5 (GLOVE) ×6 IMPLANT
GLOVE SURG SS PI 6.5 STRL IVOR (GLOVE) ×4 IMPLANT
GOWN STRL REUS W/ TWL LRG LVL3 (GOWN DISPOSABLE) ×3 IMPLANT
GOWN STRL REUS W/ TWL XL LVL3 (GOWN DISPOSABLE) ×2 IMPLANT
GOWN STRL REUS W/TWL LRG LVL3 (GOWN DISPOSABLE) ×9
GOWN STRL REUS W/TWL XL LVL3 (GOWN DISPOSABLE) ×6
HANDPIECE INTERPULSE COAX TIP (DISPOSABLE) ×3
KIT BASIN OR (CUSTOM PROCEDURE TRAY) ×3 IMPLANT
KIT ROOM TURNOVER OR (KITS) ×3 IMPLANT
MANIFOLD NEPTUNE II (INSTRUMENTS) ×3 IMPLANT
NS IRRIG 1000ML POUR BTL (IV SOLUTION) ×3 IMPLANT
PACK GENERAL/GYN (CUSTOM PROCEDURE TRAY) ×3 IMPLANT
PACK TOTAL JOINT (CUSTOM PROCEDURE TRAY) ×3 IMPLANT
PAD ARMBOARD 7.5X6 YLW CONV (MISCELLANEOUS) ×6 IMPLANT
RETRACTOR WND ALEXIS 18 MED (MISCELLANEOUS) ×1 IMPLANT
RTRCTR WOUND ALEXIS 18CM MED (MISCELLANEOUS) ×3
SET HNDPC FAN SPRY TIP SCT (DISPOSABLE) ×1 IMPLANT
SPONGE LAP 18X18 X RAY DECT (DISPOSABLE) ×2 IMPLANT
SPONGE LAP 4X18 X RAY DECT (DISPOSABLE) IMPLANT
STAPLER VISISTAT 35W (STAPLE) ×5 IMPLANT
STRIP CLOSURE SKIN 1/2X4 (GAUZE/BANDAGES/DRESSINGS) ×4 IMPLANT
SUT ETHIBOND NAB CT1 #1 30IN (SUTURE) ×3 IMPLANT
SUT ETHILON 4 0 FS 1 (SUTURE) IMPLANT
SUT MNCRL AB 4-0 PS2 18 (SUTURE) ×1 IMPLANT
SUT VIC AB 0 CT1 27 (SUTURE) ×9
SUT VIC AB 0 CT1 27XBRD ANBCTR (SUTURE) ×1 IMPLANT
SUT VIC AB 1 CT1 27 (SUTURE) ×6
SUT VIC AB 1 CT1 27XBRD ANBCTR (SUTURE) ×1 IMPLANT
SUT VIC AB 2-0 CT1 27 (SUTURE) ×6
SUT VIC AB 2-0 CT1 TAPERPNT 27 (SUTURE) ×1 IMPLANT
TOWEL OR 17X24 6PK STRL BLUE (TOWEL DISPOSABLE) ×3 IMPLANT
TOWEL OR 17X26 10 PK STRL BLUE (TOWEL DISPOSABLE) ×3 IMPLANT
TRAY FOLEY CATH 14FR (SET/KITS/TRAYS/PACK) ×2 IMPLANT
TRAY FOLEY CATH 16FRSI W/METER (SET/KITS/TRAYS/PACK) IMPLANT
WATER STERILE IRR 1000ML POUR (IV SOLUTION) ×6 IMPLANT

## 2013-06-07 NOTE — Anesthesia Preprocedure Evaluation (Addendum)
Anesthesia Evaluation  Patient identified by MRN, date of birth, ID band Patient awake    Reviewed: Allergy & Precautions, H&P , NPO status , Patient's Chart, lab work & pertinent test results  Airway Mallampati: I TM Distance: >3 FB Neck ROM: Full    Dental  (+) Edentulous Lower, Edentulous Upper   Pulmonary asthma , COPD COPD inhaler, Current Smoker,          Cardiovascular hypertension,     Neuro/Psych  Headaches, PSYCHIATRIC DISORDERS Anxiety Depression    GI/Hepatic GERD-  Medicated,  Endo/Other    Renal/GU      Musculoskeletal   Abdominal   Peds  Hematology   Anesthesia Other Findings   Reproductive/Obstetrics                         Anesthesia Physical Anesthesia Plan  ASA: III  Anesthesia Plan: General   Post-op Pain Management:    Induction:   Airway Management Planned:   Additional Equipment:   Intra-op Plan:   Post-operative Plan:   Informed Consent:   Plan Discussed with:   Anesthesia Plan Comments:         Anesthesia Quick Evaluation

## 2013-06-07 NOTE — Progress Notes (Signed)
restsing comfortably/sleeping, oriented to self and situation, moves self up in bed, speaks  And responds to name call, then back to sleep / dr Glennon Mac aware

## 2013-06-07 NOTE — Progress Notes (Signed)
Orthopedic Tech Progress Note Patient Details:  Pearson [birth year] [id number]  Ortho Devices Ortho Device/Splint Location: OHF applied to head of bed Ortho Device/Splint Interventions: Application   Ashok Cordia 06/07/2013, 9:33 PM

## 2013-06-07 NOTE — H&P (Signed)
Duane Richardson is an 58 y.o. male.   Chief Complaint:   Severe left hip pain with known OA. HPI:   58 yo male with severe pain involving his left hip well-documented with x-rays of his left hip.  Unfortunately, last year he fell and fractured his left hip in the intertrochanteric area.  This was fixed appropriately in [place] with a compression screw and plate.  He has since healed his intertrochanteric hip fracture, but his pain from his severe hip OA continues to cause decreased mobility and a decline in his quality of life.  He now wishes to proceed with hardware removal and conversion to a left total hip replacement.  Past Medical History  Diagnosis Date  . Depression   . Elevated PSA   . Arthritis   . Hyperlipidemia   . Cataract   . Anxiety   . Bipolar 1 disorder   . ETOH abuse     quit Oct 2013, then relapsed, as of Dec 2013 now has abstained X 1 month  . COPD (chronic obstructive pulmonary disease)   . Asthma   . Allergy   . Hypertension   . GERD (gastroesophageal reflux disease)     Past Surgical History  Procedure Laterality Date  . Colonoscopy  12/21/09    CHE:NIDP papilla otherwise normal/ pancolonic diverticula/mutiple colonic poylps  . Spine surgery    . Back surgery      lunbar  . Colonoscopy with esophagogastroduodenoscopy (egd) N/A 10/20/2012    Procedure: COLONOSCOPY WITH ESOPHAGOGASTRODUODENOSCOPY (EGD);  Surgeon: Daneil Dolin, MD;  Location: [facility];  Service: Endoscopy;  Laterality: N/A;  10;15  . Orif hip fracture Left 11/24/2012    Procedure: OPEN REDUCTION INTERNAL FIXATION HIP;  Surgeon: Sanjuana Kava, MD;  Location: [facility] ORS;  Service: Orthopedics;  Laterality: Left;  . Cystoscopy N/A 01/31/2013    Procedure: CYSTOSCOPY FLEXIBLE;  Surgeon: Marissa Nestle, MD;  Location: [facility] ORS;  Service: Urology;  Laterality: N/A;  I would like to do this around 1 pm on monday.   . Transurethral resection of prostate N/A 03/22/2013    Procedure: TRANSURETHRAL  RESECTION OF THE PROSTATE (TURP);  Surgeon: Marissa Nestle, MD;  Location: [facility] ORS;  Service: Urology;  Laterality: N/A;    Family History  Problem Relation Age of Onset  . Colon cancer Neg Hx   . Mental illness Sister    Social History:  reports that he has been smoking Cigars and Cigarettes.  He has a 30 pack-year smoking history. He does not have any smokeless tobacco history on file. He reports that he drinks about 18 ounces of alcohol per week. He reports that he does not use illicit drugs.  Allergies: No Known Allergies  Medications Prior to Admission  Medication Sig Dispense Refill  . amLODipine (NORVASC) 10 MG tablet Take 1 tablet (10 mg total) by mouth daily.  90 tablet  3  . ferrous sulfate 325 (65 FE) MG tablet Take 325 mg by mouth daily with breakfast.      . Menthol, Topical Analgesic, (ICY HOT PAIN RELIEVING EX) Apply 1 application topically as needed (leg pain).      . Multiple Vitamins-Minerals (CERTAVITE/ANTIOXIDANTS PO) Take 1 tablet by mouth daily.      Marland Kitchen omeprazole (PRILOSEC) 20 MG capsule Take 1 capsule (20 mg total) by mouth daily.  90 capsule  3  . PROAIR HFA 108 (90 BASE) MCG/ACT inhaler INHALE 2 PUFFS INTO THE LUNGS EVERY 4 TO 6 HOURS  AS NEEDED.  8.5 g  4  . sertraline (ZOLOFT) 50 MG tablet Take 50 mg by mouth 3 (three) times daily.      . tamsulosin (FLOMAX) 0.4 MG CAPS capsule Take 1 capsule (0.4 mg total) by mouth daily.  30 capsule  0    No results found for this or any previous visit (from the past 48 hour(s)). No results found.  Review of Systems  Musculoskeletal: Positive for joint pain.  All other systems reviewed and are negative.   Blood pressure 126/82, pulse 80, temperature 97.9 F (36.6 C), temperature source Oral, resp. rate 18, height 5\' 11"  (1.803 m), weight 70.761 kg (156 lb), SpO2 100.00%. Physical Exam  Constitutional: He is oriented to person, place, and time. He appears well-developed and well-nourished.  HENT:  Head: Normocephalic  and atraumatic.  Eyes: EOM are normal. Pupils are equal, round, and reactive to light.  Neck: Normal range of motion. Neck supple.  Cardiovascular: Normal rate and regular rhythm.   Respiratory: Effort normal and breath sounds normal.  GI: Soft. Bowel sounds are normal.  Musculoskeletal:       Left hip: He exhibits decreased range of motion, decreased strength and bony tenderness.  Neurological: He is alert and oriented to person, place, and time.  Skin: Skin is warm and dry.  Psychiatric: He has a normal mood and affect.     Assessment/Plan Severe arthritis and pain left hip with a history of ORIF of a previous left hip intertrochanteric fracture 1)  To the OR today for hardware removal from his left hip (compression screw and plate)  Followed by a left direct anterior total hip replacement.  He will then be admitted as an inpatient.  He understands fully the risks and benefits of surgery.  Mcarthur Rossetti 06/07/2013, 12:44 PM

## 2013-06-07 NOTE — Plan of Care (Signed)
Problem: Consults Goal: Diagnosis- Total Joint Replacement Outcome: Completed/Met Date Met:  06/07/13 Primary Total Hip

## 2013-06-07 NOTE — Transfer of Care (Signed)
Immediate Anesthesia Transfer of Care Note  Patient: Duane Richardson  Procedure(s) Performed: Procedure(s): REMOVE HARDWARE LEFT HIP AND LEFT TOTAL HIP ARTHROPLASTY ANTERIOR APPROACH (Left) HARDWARE REMOVAL (Left)  Patient Location: PACU  Anesthesia Type:General  Level of Consciousness: awake, alert  and confused  Airway & Oxygen Therapy: Patient connected to face mask oxygen  Post-op Assessment: Report given to PACU RN  Post vital signs: stable  Complications: No apparent anesthesia complications

## 2013-06-07 NOTE — Anesthesia Procedure Notes (Signed)
Procedure Name: Intubation Date/Time: 06/07/2013 2:12 PM Performed by: Carola Frost Pre-anesthesia Checklist: Timeout performed, Patient identified, Emergency Drugs available, Suction available and Patient being monitored Patient Re-evaluated:Patient Re-evaluated prior to inductionOxygen Delivery Method: Circle system utilized Preoxygenation: Pre-oxygenation with 100% oxygen Intubation Type: IV induction and Rapid sequence Ventilation: Mask ventilation without difficulty Laryngoscope Size: Mac and 4 Grade View: Grade I Tube type: Oral Tube size: 7.5 mm Number of attempts: 1 Placement Confirmation: CO2 detector,  positive ETCO2,  ETT inserted through vocal cords under direct vision and breath sounds checked- equal and bilateral Secured at: 22 cm Tube secured with: Tape Dental Injury: Teeth and Oropharynx as per pre-operative assessment

## 2013-06-07 NOTE — Brief Op Note (Signed)
06/07/2013  6:25 PM  PATIENT:  Duane Richardson  58 y.o. male  PRE-OPERATIVE DIAGNOSIS:  Severe osteoarthritis left hip, Retained hardware left hip  POST-OPERATIVE DIAGNOSIS:  Severe osteoarthritis left hip, Retained hardware left hip  PROCEDURE:  Procedure(s): REMOVE HARDWARE LEFT HIP AND LEFT TOTAL HIP ARTHROPLASTY ANTERIOR APPROACH (Left) HARDWARE REMOVAL (Left)  SURGEON:  Surgeon(s) and Role:    * Mcarthur Rossetti, MD - Primary  PHYSICIAN ASSISTANT: Benita Stabile, PA-C  ANESTHESIA:   general  EBL:  Total I/O In: 3100 [I.V.:2600; IV Piggyback:500] Out: 1000 [Blood:1000]  BLOOD ADMINISTERED:none  DRAINS: none   LOCAL MEDICATIONS USED:  NONE  SPECIMEN:  No Specimen  DISPOSITION OF SPECIMEN:  N/A  COUNTS:  YES  TOURNIQUET:  * No tourniquets in log *  DICTATION: .Other Dictation: Dictation Number [id number]  PLAN OF CARE: Admit to inpatient   PATIENT DISPOSITION:  PACU - hemodynamically stable.   Delay start of Pharmacological VTE agent (>24hrs) due to surgical blood loss or risk of bleeding: no

## 2013-06-08 ENCOUNTER — Encounter (HOSPITAL_COMMUNITY): Payer: Self-pay | Admitting: Orthopaedic Surgery

## 2013-06-08 LAB — BASIC METABOLIC PANEL
BUN: 16 mg/dL (ref 6–23)
CO2: 21 mEq/L (ref 19–32)
Calcium: 8.2 mg/dL — ABNORMAL LOW (ref 8.4–10.5)
Chloride: 103 mEq/L (ref 96–112)
Creatinine, Ser: 0.86 mg/dL (ref 0.50–1.35)
GFR calc Af Amer: >90 mL/min (ref >90)
GFR calc non Af Amer: >90 mL/min (ref >90)
Glucose, Bld: 156 mg/dL — ABNORMAL HIGH (ref 70–99)
Potassium: 5.4 mEq/L — ABNORMAL HIGH (ref 3.7–5.3)
Sodium: 137 mEq/L (ref 137–147)

## 2013-06-08 LAB — CBC
HCT: 26.3 % — ABNORMAL LOW (ref 39.0–52.0)
Hemoglobin: 8.9 g/dL — ABNORMAL LOW (ref 13.0–17.0)
MCH: 29 pg (ref 26.0–34.0)
MCHC: 33.8 g/dL (ref 30.0–36.0)
MCV: 85.7 fL (ref 78.0–100.0)
Platelets: 125 10*3/uL — ABNORMAL LOW (ref 150–400)
RBC: 3.07 MIL/uL — ABNORMAL LOW (ref 4.22–5.81)
RDW: 16.2 % — ABNORMAL HIGH (ref 11.5–15.5)
WBC: 8.5 10*3/uL (ref 4.0–10.5)

## 2013-06-08 NOTE — Progress Notes (Signed)
Occupational Therapy Evaluation Patient Details Name: Duane Richardson MRN: [id number] DOB: January 18, 1956 Today's Date: 06/08/2013    History of Present Illness Admitted for L THA Direct Anterior approach and hardware removal   Clinical Impression   PTA pt lived at home with brother and was independent with assistive devices, however required some assistance from brother to don/doff socks and shoes PRN. Pt reports that his brother will be home 24/7 to assist with ADLs. Education and training provided regarding compensatory techniques for LB ADLs. Pt limited by orthostatic hypotension and pain in L hip (surgical site). Pt would benefit from continued OT services to promote independence with ADLs while pain is being managed.     Follow Up Recommendations  Supervision/Assistance - 24 hour    Equipment Recommendations  3 in 1 bedside comode       Precautions / Restrictions Precautions Precautions: Fall Restrictions Weight Bearing Restrictions: Yes LLE Weight Bearing: Weight bearing as tolerated Other Position/Activity Restrictions: orthostatic hypotension      Mobility Bed Mobility Overal bed mobility: Needs Assistance Bed Mobility: Supine to Sit     Supine to sit: Max assist     General bed mobility comments: Cues for technique; Requiring max assist for clearing both LEs from EOB; Physical assist to elevate trunk from bed, pt trying to use trapeze  Transfers Overall transfer level: Needs assistance Equipment used: Rolling walker (2 wheeled) Transfers: Stand Pivot Transfers   Stand pivot transfers: +2 safety/equipment;Mod assist       General transfer comment: Pt reports dizziness sitting EOB but motivated to sit upright. Transfer completed from bed to recliner and pt reclined immediately to resolve orthostatic hypotension.         ADL Overall ADL's : Needs assistance/impaired Eating/Feeding: Independent;Sitting   Grooming: Set up;Sitting   Upper Body Bathing: Set  up;Sitting   Lower Body Bathing: Moderate assistance;Sit to/from stand   Upper Body Dressing : Set up;Sitting   Lower Body Dressing: Maximal assistance;Sit to/from stand   Toilet Transfer: +2 for safety/equipment;Stand-pivot;RW;Moderate assistance (sit<>stand from bed to recliner)                             Pertinent Vitals/Pain Pt cries out and grimaces with LLE movement, however did not provide numerical pain score. BP sitting EOB dropped to 78/42. Following transfer and elevation of LEs and reclining head, BP returned to 114/64 and dizziness resolved. HR was 110 bpm.      Hand Dominance Right   Extremity/Trunk Assessment Upper Extremity Assessment Upper Extremity Assessment: Overall WFL for tasks assessed   Lower Extremity Assessment Lower Extremity Assessment: Defer to PT evaluation LLE Deficits / Details: Decr AROM and strength, limtied by pain LLE: Unable to fully assess due to pain       Communication Communication Communication: No difficulties   Cognition Arousal/Alertness: Awake/alert Behavior During Therapy: WFL for tasks assessed/performed Overall Cognitive Status: Within Functional Limits for tasks assessed                                Home Living Family/patient expects to be discharged to:: Private residence Living Arrangements: Other relatives (brother) Available Help at Discharge: Family;Available 24 hours/day Type of Home: House Home Access: Stairs to enter [employer] of Steps: 3 Entrance Stairs-Rails: Right Home Layout: One level     Bathroom Shower/Tub: Tub/shower unit Shower/tub characteristics: Engineer, building services: Standard  Home Equipment: Walker - 2 wheels;Shower seat (rollator with seat)          Prior Functioning/Environment Level of Independence: Needs assistance  Gait / Transfers Assistance Needed: with RW ADL's / Homemaking Assistance Needed: Brother assists with socks/shoes prn         OT Diagnosis: Generalized weakness;Acute pain   OT Problem List: Decreased strength;Decreased range of motion;Decreased activity tolerance;Impaired balance (sitting and/or standing);Decreased safety awareness;Decreased knowledge of use of DME or AE;Pain   OT Treatment/Interventions: Self-care/ADL training;Therapeutic exercise;Energy conservation;DME and/or AE instruction;Therapeutic activities;Patient/family education;Balance training    OT Goals(Current goals can be found in the care plan section) Acute Rehab OT Goals Patient Stated Goal: Back to normal OT Goal Formulation: With patient Time For Goal Achievement: 06/15/13 Potential to Achieve Goals: Good  OT Frequency: Min 2X/week           Co-evaluation PT/OT/SLP Co-Evaluation/Treatment: Yes Reason for Co-Treatment: For patient/therapist safety   OT goals addressed during session: ADL's and self-care      End of Session Equipment Utilized During Treatment: Gait belt;Rolling walker Nurse Communication: Other (comment);Mobility status (pt in recliner)  Activity Tolerance: Other (comment) (pt limited by orthostatic hypotension) Patient left: in chair;with call bell/phone within reach   Time: 1535-1557 OT Time Calculation (min): 22 min Charges:  OT General Charges $OT Visit: 1 Procedure OT Evaluation $Initial OT Evaluation Tier I: 1 Procedure  Rae Lips [phone] 06/08/2013, 4:47 PM

## 2013-06-08 NOTE — Op Note (Signed)
NAMEMASSIMO, HARTLAND NO.:  [id number]  MEDICAL RECORD NO.:  [id number]  LOCATION:  5N32C                        FACILITY:  [facility]  PHYSICIAN:  Lind Guest. [name], M.D.DATE OF BIRTH:  1955/12/23  DATE OF PROCEDURE:  06/07/2013 DATE OF DISCHARGE:                              OPERATIVE REPORT   PREOPERATIVE DIAGNOSES: 1. Left hip avascular necrosis. 2. Retained hardware, left hip with dynamic compression screw and     plate from healed intertrochanteric hip fracture.  POSTOPERATIVE DIAGNOSES: 1. Left hip avascular necrosis. 2. Retained hardware, left hip with dynamic compression screw and     plate from healed intertrochanteric hip fracture.  PROCEDURE: 1. Removal of hardware, left hip. 2. Left total hip arthroplasty through direct anterior approach and a     separate incision.  IMPLANTS:  DePuy Sector Gription acetabular components size 50 with apex hole eliminator guide and a single screw, size 36+ 4 neutral polyethylene liner for a size 52 cup, size 16 Corail femoral component with standard offset, size 36+ 5 ceramic hip ball.  SURGEON:  Lind Guest. [name], M.D.  ASSISTANT:  Erskine Emery, PA-C.  ANESTHESIA:  General.  ANTIBIOTICS:  2 g IV Ancef.  BLOOD LOSS:  150 mL.  COMPLICATIONS:  None.  INDICATIONS:  Mr. Pantaleo is a 58 year old gentleman who, I believe, may have been a former alcoholic.  He had pre-existing left hip avascular necrosis but then had a fall last year sustaining an intertrochanteric femur fracture with significant displacement.  He was taken appropriately to the operating room in [place], [place], and open reduction and internal fixation with the compression hip screw and side plate (DHS) was placed.  He has since gone on to heal that fracture but he has been experiencing collapse in his femoral head, and at this point, has debilitating hip pain.  At this point, we recommended removal of his hardware  through his previous incision, and then converting into total hip replacement.  We explained the difficulty of this to him in great detail.  He does wish to proceed with surgery.  PROCEDURE DESCRIPTION:  After informed consent was obtained, appropriate left hip was marked, he was brought to the operating room and general anesthesia was obtained while he was on a stretcher.  He was then placed supine on the Hana fracture table with the perineal post in place and both legs in inline skeletal traction devices but no traction applied. His left operative hip was assessed fluoroscopically and then was prepped and draped with DuraPrep and sterile drapes including sterile shower curtain drapes and sterile drapes over the C-arm.  A time-out was called to identify correct patient, correct left hip.  We then made an incision laterally over his previous incision from his dynamic hip plate and dissected down to the plate itself.  We were able to remove all 4 bicortical screws, the plate and then the compression screw.  We left this wound open for bone grafting after.  We then proceeded with a direct anterior approach to the hip.  An incision was then made inferior and posterior to the anterior-superior iliac spine through a separate incision.  We dissected down to the  tensor fascia lata muscle and the tensor fascia was divided longitudinally.  We then proceeded with a direct anterior approach to the hip.  A Cobra retractor was placed around the remnants of the medial neck and around the lateral neck.  We cauterized the lateral femoral circumflex vessels.  I then opened up the hip capsule in a L-type format exposing the hip joint.  We then made our femoral neck cut proximal to the lesser trochanter as best we could figure due to heterotopic bone and completed this with an osteotome.  We placed a corkscrew guide in the femoral head, removed the femoral head in its entirety, and found to be devoid of  cartilage with collapse.  We then cleaned the acetabular remnants of the acetabular labrum and debris and began reaming under direct visualization and direct fluoroscopy from a size 42 reamer up to a size 52 with all reamers again placed under direct visualization and the last reamer under direct fluoroscopy so we could obtain our depth of reaming, our inclination, and anteversion. Once we were pleased with this, we placed the real DePuy Sector Gription acetabular component size 52, apex eliminator guide and a single screw. We then placed the real 36+ 4 neutral polyethylene liner.  Attention was then turned to the femur.  With the leg externally rotated to 100 degrees, extended, and adducted, we were able to place the Mueller retractor medially and Hohmann retractor behind the greater trochanter, released the lateral joint capsule and had quite difficult time finding the canal and getting good version.  Several times we past the broach out lateral as well as medial and finally after much effort, we found the correct path to take the broach using the fluoroscopy.  Due to his very thin bone, his wide open canal, again, which I believe is from previous alcohol abuse, we had a trial __________ with a size 16 stem. We took it out and placed the real size 16 Corail stem.  We then trialed several hip balls and was decided to go with a size 5, 36+ 5 ceramic ball.  We put all components in place, reduced into the pelvis and was stable past 90 degrees of rotation externally with minimal shuck.  It was difficult again to assess his leg lengths due to significant amount of heterotopic bone around the lesser trochanter.  We then copiously irrigated both wounds with normal saline solution and closed the deep tissue over both wounds with #1 Vicryl suture, 0 Vicryl in the deep tissue, 2-0 Vicryl in the subcutaneous tissue, and staples on the skin on both incisions.  Xeroform and Aquacel dressings were  applied on both incisions.  He was taken off the Hana table and taken to recovery room in stable condition.  All final counts were correct and no complications noted.  Of note, he also received tranexamic acid to help with decreasing his blood loss.  He did still lose 950 mL of blood, we will monitor this.  A Foley was placed at the end of the case as well.  Of note, Erskine Emery, PA-C assisted during the entire case and his assistance was very crucial throughout this entire case.     Lind Guest. [name], M.D.     CYB/MEDQ  D:  06/07/2013  T:  06/08/2013  Job:  [id number]

## 2013-06-08 NOTE — Evaluation (Signed)
Physical Therapy Evaluation Patient Details Name: Duane Richardson Tiffany MRN: [id number] DOB: 04/02/1955 Today's Date: 06/08/2013   History of Present Illness  Admitted for L THA Direct Anterior approach and hardware removal  Clinical Impression  Patient is s/p above surgery resulting in functional limitations due to the deficits listed below (see PT Problem List).  Patient will benefit from skilled PT to increase their independence and safety with mobility to allow discharge to the venue listed below.       Follow Up Recommendations Home health PT;Supervision/Assistance - 24 hour    Equipment Recommendations  None recommended by PT    Recommendations for Other Services       Precautions / Restrictions Precautions Precautions: Fall Restrictions LLE Weight Bearing: Weight bearing as tolerated Other Position/Activity Restrictions: Orthostatic hypotension on eval      Mobility  Bed Mobility Overal bed mobility: Needs Assistance Bed Mobility: Supine to Sit     Supine to sit: Max assist     General bed mobility comments: Cues for technique; Requiring max assist for clearing both LEs from EOB; Physical assist to elevate trunk from bed, pt trying to use trapeze  Transfers                    Ambulation/Gait                Stairs            Wheelchair Mobility    Modified Rankin (Stroke Patients Only)       Balance Overall balance assessment: Needs assistance Sitting-balance support: Bilateral upper extremity supported Sitting balance-Leahy Scale: Poor Sitting balance - Comments: Tending to lean antalgically to Right; reported dizziness sitting EOB, pt with postural hypotension, so laid back down                                     Pertinent Vitals/Pain Did not rate, but pt in significant pain;  patient repositioned for comfort     Home Living Family/patient expects to be discharged to:: Private residence   Available Help at  Discharge: Family;Available 24 hours/day Type of Home: House Home Access: Stairs to enter Entrance Stairs-Rails: Right Entrance Stairs-Number of Steps: 3 Home Layout: One level Home Equipment: Walker - 2 wheels;Cane - single point      Prior Function Level of Independence: Independent with assistive device(s);Needs assistance   Gait / Transfers Assistance Needed: with RW  ADL's / Homemaking Assistance Needed: Brother assists with socks/shoes prn        Hand Dominance        Extremity/Trunk Assessment   Upper Extremity Assessment: Defer to OT evaluation           Lower Extremity Assessment: LLE deficits/detail   LLE Deficits / Details: Decr AROM and strength, limtied by pain     Communication   Communication: No difficulties  Cognition Arousal/Alertness: Awake/alert Behavior During Therapy: WFL for tasks assessed/performed Overall Cognitive Status: Within Functional Limits for tasks assessed                      General Comments      Exercises        Assessment/Plan    PT Assessment Patient needs continued PT services  PT Diagnosis Difficulty walking;Acute pain   PT Problem List Decreased strength;Decreased range of motion;Decreased activity tolerance;Decreased balance;Decreased mobility;Decreased knowledge of use of DME;Pain  PT  Treatment Interventions DME instruction;Gait training;Stair training;Functional mobility training;Therapeutic activities;Therapeutic exercise;Patient/family education   PT Goals (Current goals can be found in the Care Plan section) Acute Rehab PT Goals Patient Stated Goal: Back to normal PT Goal Formulation: With patient Time For Goal Achievement: 02/14/13 Potential to Achieve Goals: Good    Frequency 7X/week   Barriers to discharge        Co-evaluation               End of Session   Activity Tolerance: Patient limited by fatigue;Other (comment) (postural hypotension) Patient left: in bed;with call  bell/phone within reach Nurse Communication: Mobility status         Time: 1036-1100 PT Time Calculation (min): 24 min   Charges:   PT Evaluation $Initial PT Evaluation Tier I: 1 Procedure PT Treatments $Therapeutic Activity: 8-22 mins   PT G Codes:          [facility] 06/08/2013, 4:17 PM  Roney Marion, [place] Pager [phone] Office [phone]

## 2013-06-08 NOTE — Progress Notes (Signed)
Utilization review completed.  

## 2013-06-08 NOTE — Anesthesia Postprocedure Evaluation (Signed)
Anesthesia Post Note  Patient: Duane Richardson  Procedure(s) Performed: Procedure(s) (LRB): REMOVE HARDWARE LEFT HIP AND LEFT TOTAL HIP ARTHROPLASTY ANTERIOR APPROACH (Left) HARDWARE REMOVAL (Left)  Anesthesia type: general  Patient location: PACU  Post pain: Pain level controlled  Post assessment: Patient's Cardiovascular Status Stable  Last Vitals:  Filed Vitals:   06/08/13 1114  BP: 117/70  Pulse: 110  Temp: 36.8 C  Resp: 18    Post vital signs: Reviewed and stable  Level of consciousness: sedated  Complications: No apparent anesthesia complications

## 2013-06-08 NOTE — Progress Notes (Signed)
Subjective: 1 Day Post-Op Procedure(s) (LRB): REMOVE HARDWARE LEFT HIP AND LEFT TOTAL HIP ARTHROPLASTY ANTERIOR APPROACH (Left) HARDWARE REMOVAL (Left) Patient reports pain as moderate.  Acute blood loss anemia from surgery; will monitor vitals.  Objective: Vital signs in last 24 hours: Temp:  [97.8 F (36.6 C)-98.7 F (37.1 C)] 98.7 F (37.1 C) (04/15 0625) Pulse Rate:  [80-135] 102 (04/15 0625) Resp:  [12-20] 18 (04/15 0625) BP: (101-130)/(64-82) 105/64 mmHg (04/15 0625) SpO2:  [99 %-100 %] 99 % (04/15 0625) Weight:  [70.761 kg (156 lb)] 70.761 kg (156 lb) (04/14 0946)  Intake/Output from previous day: 04/14 0701 - 04/15 0700 In: 3500 [I.V.:3000; IV Piggyback:500] Out: 1150 [Urine:150; Blood:1000] Intake/Output this shift:     Recent Labs  06/08/13 0404  HGB 8.9*    Recent Labs  06/08/13 0404  WBC 8.5  RBC 3.07*  HCT 26.3*  PLT 125*    Recent Labs  06/08/13 0404  NA 137  K 5.4*  CL 103  CO2 21  BUN 16  CREATININE 0.86  GLUCOSE 156*  CALCIUM 8.2*   No results found for this basename: LABPT, INR,  in the last 72 hours  Sensation intact distally Intact pulses distally Dorsiflexion/Plantar flexion intact Incision: scant drainage  Assessment/Plan: 1 Day Post-Op Procedure(s) (LRB): REMOVE HARDWARE LEFT HIP AND LEFT TOTAL HIP ARTHROPLASTY ANTERIOR APPROACH (Left) HARDWARE REMOVAL (Left) Up with therapy; full weight as tolerated left hip Will need placement post hospital stay at hopefully the [facility] in [place].  Mcarthur Rossetti 06/08/2013, 7:33 AM

## 2013-06-08 NOTE — Progress Notes (Signed)
Physical Therapy  Note  Orthostatic BPs during initial PT eval as follows:   06/08/13 1048 06/08/13 1053  Vital Signs  Pulse Rate ! 101 93  BP ! 72/47 mmHg 118/72 mmHg  BP Location Left arm Left arm  Patient Position, if appropriate Sitting (With reported dizziness) Lying (post sitting EOB for approx 2 minutes)    Full PT eval report to follow;   Roney Marion, [place] Pager [phone] Office [phone]

## 2013-06-08 NOTE — Care Management Note (Signed)
CARE MANAGEMENT NOTE 06/08/2013  Patient:  Duane Richardson, Duane Richardson   Account Number:  [id number]  Date Initiated:  06/08/2013  Documentation initiated by:  Ricki Miller  Subjective/Objective Assessment:   58 yr old male s/p left total hip with hardware removal.     Action/Plan:   Case manager spoke with patient concerning home health and DME needs at discharge. Choice offered. Referral called to [facility]. Patient states he has a rolling walker.   Anticipated DC Date:  06/09/2013   Anticipated DC Plan:  [place] [facility]  CM consult      [place]   Choice offered to / List presented to:  C-1 Patient   DME arranged  3-N-1      DME agency  [place] arranged  [place].   Status of service:  Completed, signed off Medicare Important Message given?   (If response is "NO", the following Medicare IM given date fields will be blank) Date Medicare IM given:   Date Additional Medicare IM given:    Discharge Disposition:  [place]  Per UR Regulation:

## 2013-06-08 NOTE — Progress Notes (Signed)
Physical Therapy Treatment Patient Details Name: Duane Richardson MRN: [id number] DOB: 15-Nov-1955 Today's Date: 06/08/2013    History of Present Illness Admitted for L THA Direct Anterior approach and hardware removal    PT Comments    Pt with continued decr BP with upright activity, but he was very much wanting OOB, so we assisted him OOB to recliner, with 2 person assist for safety; Anticipate good progress once postural hypotension clears and pain is managed   Follow Up Recommendations  Home health PT;Supervision/Assistance - 24 hour     Equipment Recommendations  None recommended by PT    Recommendations for Other Services       Precautions / Restrictions Precautions Precautions: Fall Restrictions Weight Bearing Restrictions: Yes LLE Weight Bearing: Weight bearing as tolerated Other Position/Activity Restrictions: orthostatic hypotension    Mobility  Bed Mobility Overal bed mobility: Needs Assistance Bed Mobility: Supine to Sit     Supine to sit: Max assist     General bed mobility comments: Cues for technique; Requiring max assist for clearing both LEs from EOB; Physical assist to elevate trunk from bed, pt trying to use trapeze, despite cues to not rely on trapeze as he likely won't have one at home  Transfers Overall transfer level: Needs assistance Equipment used: Rolling walker (2 wheeled) Transfers: Sit to/from Stand Sit to Stand: Mod assist Stand pivot transfers: +2 safety/equipment;Mod assist       General transfer comment: Pt reports dizziness sitting EOB but motivated to sit upright. Transfer completed from bed to recliner and pt reclined immediately to resolve orthostatic hypotension.  Ambulation/Gait Ambulation/Gait assistance: Min assist;+2 safety/equipment Ambulation Distance (Feet): 2 Feet (pivot steps bed to chair) Assistive device: Rolling walker (2 wheeled)       General Gait Details: Cues for stepping; Noted pt keeping essentially TWBing  LLE, likely secondary to pain   Stairs            Wheelchair Mobility    Modified Rankin (Stroke Patients Only)       Balance Overall balance assessment: Needs assistance Sitting-balance support: Bilateral upper extremity supported Sitting balance-Leahy Scale: Poor Sitting balance - Comments: Tending to lean antalgically to Right; reported dizziness sitting EOB, pt with postural hypotension, so laid back down                            Cognition Arousal/Alertness: Awake/alert Behavior During Therapy: WFL for tasks assessed/performed Overall Cognitive Status: Within Functional Limits for tasks assessed                      Exercises      General Comments General comments (skin integrity, edema, etc.): Pt with BP of 78/42 sitting EOB, however motivated to sit up. Transfer completed and orthostatic hypotension resolved quickly with LEs elevated and head reclined.  BP returned to 114/64.      Pertinent Vitals/Pain decr BP sitting EOB, which cleared once pt almost supine in recliner Did not rate pain, but with grimace and elevated HR pain is likely significant patient repositioned for comfort     Home Living Family/patient expects to be discharged to:: Private residence Living Arrangements: Other relatives (brother) Available Help at Discharge: Family;Available 24 hours/day Type of Home: House Home Access: Stairs to enter Entrance Stairs-Rails: Right Home Layout: One level Home Equipment: Walker - 2 wheels;Shower seat (rollator with seat)      Prior Function Level of Independence: Needs assistance  Gait / Transfers Assistance Needed: with RW ADL's / Homemaking Assistance Needed: Brother assists with socks/shoes prn     PT Goals (current goals can now be found in the care plan section) Acute Rehab PT Goals Patient Stated Goal: Back to normal PT Goal Formulation: With patient Time For Goal Achievement: 02/14/13 Potential to Achieve Goals:  Good Progress towards PT goals: Progressing toward goals    Frequency  7X/week    PT Plan Current plan remains appropriate    Co-evaluation   Reason for Co-Treatment: For patient/therapist safety   OT goals addressed during session: Proper use of [facility] equipment and DME     End of Session Equipment Utilized During Treatment: Gait belt Activity Tolerance: Patient limited by fatigue;Other (comment) Patient left: in chair;with call bell/phone within reach;Other (comment) (recliner near supine)     Time: 0093-8182 PT Time Calculation (min): 22 min  Charges:  $Therapeutic Activity: 8-22 mins                    G Codes:      [facility] [place] 06/08/2013, 4:53 PM  Roney Marion, PT  Acute Rehabilitation Services Pager [phone] Office [phone]

## 2013-06-09 LAB — POCT I-STAT 7, (LYTES, BLD GAS, ICA,H+H)
Acid-base deficit: 2 mmol/L (ref 0.0–2.0)
Bicarbonate: 24.3 mEq/L — ABNORMAL HIGH (ref 20.0–24.0)
Calcium, Ion: 1.17 mmol/L (ref 1.12–1.23)
HCT: 35 % — ABNORMAL LOW (ref 39.0–52.0)
Hemoglobin: 11.9 g/dL — ABNORMAL LOW (ref 13.0–17.0)
O2 Saturation: 92 %
Patient temperature: 36.3
Potassium: 4.4 mEq/L (ref 3.7–5.3)
Sodium: 136 mEq/L — ABNORMAL LOW (ref 137–147)
TCO2: 26 mmol/L (ref 0–100)
pCO2 arterial: 44.4 mmHg (ref 35.0–45.0)
pH, Arterial: 7.342 — ABNORMAL LOW (ref 7.350–7.450)
pO2, Arterial: 66 mmHg — ABNORMAL LOW (ref 80.0–100.0)

## 2013-06-09 LAB — CBC
HCT: 19.6 % — ABNORMAL LOW (ref 39.0–52.0)
Hemoglobin: 6.7 g/dL — CL (ref 13.0–17.0)
MCH: 29 pg (ref 26.0–34.0)
MCHC: 34.2 g/dL (ref 30.0–36.0)
MCV: 84.8 fL (ref 78.0–100.0)
Platelets: DECREASED 10*3/uL (ref 150–400)
RBC: 2.31 MIL/uL — ABNORMAL LOW (ref 4.22–5.81)
RDW: 16.5 % — ABNORMAL HIGH (ref 11.5–15.5)
WBC: 13.2 10*3/uL — ABNORMAL HIGH (ref 4.0–10.5)

## 2013-06-09 LAB — PREPARE RBC (CROSSMATCH)

## 2013-06-09 NOTE — Progress Notes (Signed)
Physical Therapy Treatment Patient Details Name: Duane Richardson MRN: [id number] DOB: 05/17/1955 Today's Date: 06/09/2013    History of Present Illness Admitted for L THA Direct Anterior approach and hardware removal    PT Comments    P.t currently with hgb. Of 6.7 and symptomatic, receiving blood at this time.  RN preferred that I limit PT to in bed activities this am.  Pt. With decreased tolerance to exercises due to what appears to be anticipation of pain.  Will return in pm as schedule permits for OOB as pt. Tolerates.    Follow Up Recommendations  Home health PT;Supervision/Assistance - 24 hour     Equipment Recommendations  None recommended by PT    Recommendations for Other Services OT consult     Precautions / Restrictions Precautions Precautions: Fall Restrictions Weight Bearing Restrictions: Yes LLE Weight Bearing: Partial weight bearing LLE Partial Weight Bearing Percentage or Pounds: 50 Other Position/Activity Restrictions: orthostatic hypotension    Mobility  Bed Mobility Overal bed mobility:  (not assessed; pt. remains in bed)                Transfers                    Ambulation/Gait                 Stairs            Wheelchair Mobility    Modified Rankin (Stroke Patients Only)       Balance                                    Cognition Arousal/Alertness: Awake/alert Behavior During Therapy: WFL for tasks assessed/performed Overall Cognitive Status: Within Functional Limits for tasks assessed                      Exercises Total Joint Exercises Gluteal Sets: AROM;Both;10 reps;Supine Straight Leg Raises: AAROM;Left;5 reps;Supine General Exercises - Lower Extremity Ankle Circles/Pumps: AROM;10 reps;Both;Supine Short Arc Quad: AAROM;Both;10 reps;Supine Heel Slides: AAROM;Both;10 reps;Supine Hip ABduction/ADduction: AAROM;Left;10 reps;Supine    General Comments        Pertinent  Vitals/Pain See vitals tab Pt. Appears stiff and hesitant to complete LE exercises which seems to increase his pain level.  Worked toward relaxation between each rep of exercise to minimize pain.      Home Living                      Prior Function            PT Goals (current goals can now be found in the care plan section) Progress towards PT goals: Progressing toward goals (in bed in ma due to low hgb)    Frequency  7X/week    PT Plan Current plan remains appropriate    Co-evaluation             End of Session           Time: 2505-3976 PT Time Calculation (min): 23 min  Charges:  $Therapeutic Exercise: 23-37 mins                    G Codes:      Ladona Ridgel 06/09/2013, 12:25 PM Gerlean Ren PT Acute Rehab Services [phone] [place] [phone]

## 2013-06-09 NOTE — Progress Notes (Signed)
Subjective: 2 Days Post-Op Procedure(s) (LRB): REMOVE HARDWARE LEFT HIP AND LEFT TOTAL HIP ARTHROPLASTY ANTERIOR APPROACH (Left) HARDWARE REMOVAL (Left) Patient reports pain as moderate.  Has symptomatic acute blood loss anemia with hgb down to 6.7. Working slowly with therapy.  Objective: Vital signs in last 24 hours: Temp:  [97.6 F (36.4 C)-98.2 F (36.8 C)] 97.7 F (36.5 C) (04/16 0626) Pulse Rate:  [92-119] 92 (04/16 0626) Resp:  [14-20] 18 (04/16 0626) BP: (72-120)/(47-72) 120/69 mmHg (04/16 0626) SpO2:  [97 %-100 %] 99 % (04/16 0626)  Intake/Output from previous day: 04/15 0701 - 04/16 0700 In: 600 [P.O.:600] Out: 250 [Urine:250] Intake/Output this shift:     Recent Labs  06/08/13 0404 06/09/13 0440  HGB 8.9* 6.7*    Recent Labs  06/08/13 0404 06/09/13 0440  WBC 8.5 13.2*  RBC 3.07* 2.31*  HCT 26.3* 19.6*  PLT 125* PENDING    Recent Labs  06/08/13 0404  NA 137  K 5.4*  CL 103  CO2 21  BUN 16  CREATININE 0.86  GLUCOSE 156*  CALCIUM 8.2*   No results found for this basename: LABPT, INR,  in the last 72 hours  Sensation intact distally Intact pulses distally Dorsiflexion/Plantar flexion intact Incision: scant drainage  Assessment/Plan: 2 Days Post-Op Procedure(s) (LRB): REMOVE HARDWARE LEFT HIP AND LEFT TOTAL HIP ARTHROPLASTY ANTERIOR APPROACH (Left) HARDWARE REMOVAL (Left) Up with therapy Transfuse 2 units PRBCs. Discharge to SNF possibly tomorrow 4/17.  Mcarthur Rossetti 06/09/2013, 7:03 AM

## 2013-06-09 NOTE — Progress Notes (Signed)
CRITICAL VALUE ALERT  Critical value received:  6.7 Hemoglobin  Date of notification:  06/09/2013  Time of notification:  05:45   Critical value read back:yes  Nurse who received alert:  Durene Fruits, RN   MD notified (1st page):  Dr. [name]   Time of first page:  06:02 AM  MD notified (2nd page):  Time of second page:  Responding MD:  Dr. Marlou Sa  Time MD responded: 06:09 AM

## 2013-06-09 NOTE — Progress Notes (Signed)
Seen and agreed 06/09/2013 [place] PTA [phone] pager [phone] office

## 2013-06-09 NOTE — Progress Notes (Signed)
Clinical Social Work Department BRIEF PSYCHOSOCIAL ASSESSMENT 06/09/2013  Patient:  KRISHNA, DANCEL     Account Number:  [id number]     Admit date:  06/07/2013  Clinical Social Worker:  Adair Laundry  Date/Time:  06/09/2013 11:30 AM  Referred by:  Physician  Date Referred:  06/09/2013 Referred for  SNF Placement   Other Referral:   Interview type:  Patient Other interview type:    PSYCHOSOCIAL DATA Living Status:  FAMILY Admitted from facility:   Level of care:   Primary support name:  Susa Raring [place] [phone] Primary support relationship to patient:  [employer] Degree of support available:   Pt reports having good support from family    CURRENT CONCERNS Current Concerns  Post-Acute Placement   Other Concerns:    SOCIAL WORK ASSESSMENT / PLAN CSW informed by PT that they are recommending [facility] but pt seems to be under the impression that he will be discharging to [facility]. CSW visited pt room and spoke with pt about home living situation. Pt reports that his brother and sister both live with him. CSW asked if he feels they would be able to support him at him because it seems like at this time PT thinks pt is appropriate to return home. Pt informed CSW that both siblings are in good health and he would easily be able to manage with them at home. CSW asked if pt still wanted to pursue SNF with the added knowledge that he would not be receiving therapy there. Pt said no he would much rather return home with [facility] services. Pt informed CSW he had already spoken with CM about having used [facility] in the past and wanting to use them again.   Assessment/plan status:  Psychosocial Support/Ongoing Assessment of Needs Other assessment/ plan:   Information/referral to community resources:   None needed    PATIENT'S/FAMILY'S RESPONSE TO PLAN OF CARE: Pt is wanting to dc home in agreement with PT recommendation       [place], Annabella

## 2013-06-09 NOTE — Progress Notes (Signed)
Occupational Therapy Treatment Patient Details Name: Duane Richardson MRN: [id number] DOB: November 30, 1955 Today's Date: 06/09/2013    History of present illness Admitted for L THA Direct Anterior approach and hardware removal   OT comments  Pt seen today for ADL session. Low Hgb levels and orthostatic hypotension, combined with high pain level in L hip are limiting pt's ability to participate in therapy. Pt performed grooming and bathing at bed level after sitting EOB with decreased BP and c/o dizziness. Pt also performed therapeutic exercise to increase strength. Pt would benefit from continued OT to promote independence with ADLs.   Follow Up Recommendations  Supervision/Assistance - 24 hour    Equipment Recommendations  3 in 1 bedside comode       Precautions / Restrictions Precautions Precautions: Fall Restrictions Weight Bearing Restrictions: Yes LLE Weight Bearing: Weight bearing as tolerated LLE Partial Weight Bearing Percentage or Pounds: 50 Other Position/Activity Restrictions: orthostatic hypotension       Mobility Bed Mobility Overal bed mobility: Needs Assistance Bed Mobility: Supine to Sit     Supine to sit: Mod assist;HOB elevated;+2 for safety/equipment     General bed mobility comments: Patient requires cues for proper technique. Patient required mod A for LE during bed mobility. Patient relied heavily on trapeze bar to power upright during bed mobility. Patient able to tolerate sitting EOB with constant reassurance in safety.  Transfers  Pt unable to transfer this date due to c/o dizziness.                         ADL Overall ADL's : Needs assistance/impaired Eating/Feeding: Independent;Sitting   Grooming: Set up;Sitting   Upper Body Bathing: Set up;Sitting   Lower Body Bathing: Moderate assistance;Sitting/lateral leans   Upper Body Dressing : Set up;Sitting                     General ADL Comments: Pt sat EOB with c/o dizziness. Pain  limiting ability to participate in ADLs at EOB. Pt participated at bed level.      Vision  Per pt report, no change from baseline.                          Cognition  Arousal/Alertness: Awake/alert Behavior During Therapy: WFL for tasks assessed/performed Overall Cognitive Status: Within Functional Limits for tasks assessed                         Exercises Other Exercises Other Exercises: tricep extension using theraband level 2 (red) tied to bed rails alternating arms x 10reps twice           Pertinent Vitals/ Pain       Pt reports high pain in L hip, grimaces with therapist assisted movement. Pt pain and dizziness limiting ability to sit upright.         Laying in bed: BP 158/93 and HR 152bpm        Sitting EOB: BO 113/66 and HR 97bpm  Home Living Family/patient expects to be discharged to:: Private residence Living Arrangements:  (Brother)                                          Frequency Min 2X/week     Progress Toward Goals  OT Goals(current goals can now  be found in the care plan section)  Progress towards OT goals: Progressing toward goals (slowly)     Plan Discharge plan remains appropriate    Co-evaluation    PT/OT/SLP Co-Evaluation/Treatment: Yes Reason for Co-Treatment: For patient/therapist safety PT goals addressed during session: Strengthening/ROM OT goals addressed during session: ADL's and self-care      End of Session Equipment Utilized During Treatment: Other (comment) (theraband level 2 (red))   Activity Tolerance Patient limited by pain   Patient Left in bed;with call bell/phone within reach   Nurse Communication Other (comment) (vitals)        Time: 1219-7588 OT Time Calculation (min): 49 min  Charges: OT General Charges $OT Visit: 1 Procedure OT Treatments $Self Care/Home Management : 8-22 mins  Juluis Rainier [phone] 06/09/2013, 3:04 PM

## 2013-06-09 NOTE — Progress Notes (Signed)
Physical Therapy Treatment Patient Details Name: Duane Richardson MRN: [id number] DOB: 05/15/1955 Today's Date: 06/09/2013    History of Present Illness Admitted for L THA Direct Anterior approach and hardware removal    PT Comments    Patient requires constant reassurance and frequent rest breaks to complete tasks. Patient was motivated to increase therapy and tolerance. Patient is slowly progressing towards goals but requires A with mobility and safety due to unstable vital signs. OT in room with co-treatment to A with safety and patient tolerance. Patient able to perform activity as stated in OT note. Patient would benefit from continued plan of care to help progress patient. Recommend 24 hour supervision with HHPT for safety.   Follow Up Recommendations  Home health PT;Supervision/Assistance - 24 hour     Equipment Recommendations  None recommended by PT    Recommendations for Other Services OT consult     Precautions / Restrictions Precautions Precautions: Fall Restrictions Weight Bearing Restrictions: Yes LLE Weight Bearing: Weight bearing as tolerated LLE Partial Weight Bearing Percentage or Pounds: 50 Other Position/Activity Restrictions: orthostatic hypotension    Mobility  Bed Mobility Overal bed mobility: Needs Assistance Bed Mobility: Supine to Sit     Supine to sit: Mod assist;HOB elevated;+2 for safety/equipment     General bed mobility comments: Patient requires cues for proper technique. Patient required mod A for LE during bed mobility. Patient relied heavily on trapeze bar to power upright during bed mobility. Patient able to tolerate sitting EOB with constant reassurance in safety.  Transfers                    Ambulation/Gait                 Stairs            Wheelchair Mobility    Modified Rankin (Stroke Patients Only)       Balance Overall balance assessment: Needs assistance Sitting-balance support: Bilateral upper  extremity supported Sitting balance-Leahy Scale: Poor Sitting balance - Comments: Patient able to tolerate minimal EOB sitting with exercises. Patient c/o dizziness and was repositioned in supine.                            Cognition Arousal/Alertness: Awake/alert Behavior During Therapy: WFL for tasks assessed/performed Overall Cognitive Status: Within Functional Limits for tasks assessed                      Exercises Total Joint Exercises Ankle Circles/Pumps: AROM;Both;10 reps;Supine Quad Sets: 5 reps;Left;Supine;AROM Gluteal Sets: AROM;Both;10 reps;Supine Heel Slides: AROM;Left;5 reps;Supine Straight Leg Raises: AAROM;Left;5 reps;Supine Long Arc Quad: Seated;AAROM;Left;5 reps General Exercises - Lower Extremity Ankle Circles/Pumps: AROM;10 reps;Both;Supine Short Arc Quad: AAROM;Both;10 reps;Supine Heel Slides: AAROM;Both;10 reps;Supine Hip ABduction/ADduction: AAROM;Left;10 reps;Supine    General Comments        Pertinent Vitals/Pain Prior to therapy BP left arm in supine 158/93 mmHg, heart rate 152 bpm During therapeutic exercise BP left arm in sitting 113/66 mmHg, heart rate 97 bpm     Home Living Family/patient expects to be discharged to:: Private residence Living Arrangements:  (Brother)                  Prior Function            PT Goals (current goals can now be found in the care plan section) Progress towards PT goals: Progressing toward goals (slowly)    Frequency  7X/week    PT Plan Current plan remains appropriate    Co-evaluation PT/OT/SLP Co-Evaluation/Treatment: Yes Reason for Co-Treatment: For patient/therapist safety PT goals addressed during session: Strengthening/ROM OT goals addressed during session: ADL's and self-care     End of Session   Activity Tolerance: Patient limited by fatigue;Patient tolerated treatment well (patient able to tolerate therapy better compared to earlier PT session today) Patient  left: in bed;with call bell/phone within reach     Time: 1350-1439 PT Time Calculation (min): 49 min  Charges:  $Therapeutic Exercise: 23-37 mins                    G Codes:      [place], SPTA 06/09/2013, 3:01 PM

## 2013-06-10 LAB — TYPE AND SCREEN
ABO/RH(D): A POS
Antibody Screen: NEGATIVE
Unit division: 0
Unit division: 0

## 2013-06-10 LAB — CBC
HCT: 23.6 % — ABNORMAL LOW (ref 39.0–52.0)
Hemoglobin: 8.2 g/dL — ABNORMAL LOW (ref 13.0–17.0)
MCH: 29.3 pg (ref 26.0–34.0)
MCHC: 34.7 g/dL (ref 30.0–36.0)
MCV: 84.3 fL (ref 78.0–100.0)
Platelets: 105 10*3/uL — ABNORMAL LOW (ref 150–400)
RBC: 2.8 MIL/uL — ABNORMAL LOW (ref 4.22–5.81)
RDW: 15.4 % (ref 11.5–15.5)
WBC: 13.4 10*3/uL — ABNORMAL HIGH (ref 4.0–10.5)

## 2013-06-10 MED ORDER — DSS 100 MG PO CAPS: 100.0000 mg | ORAL_CAPSULE | Freq: Two times a day (BID) | ORAL | Status: DC

## 2013-06-10 MED ORDER — OXYCODONE-ACETAMINOPHEN 5-325 MG PO TABS: 1.0000 | ORAL_TABLET | ORAL | Status: DC | PRN

## 2013-06-10 MED ORDER — ASPIRIN 325 MG PO TBEC: 325.0000 mg | DELAYED_RELEASE_TABLET | Freq: Two times a day (BID) | ORAL | Status: DC

## 2013-06-10 NOTE — Progress Notes (Signed)
Subjective: 3 Days Post-Op Procedure(s) (LRB): REMOVE HARDWARE LEFT HIP AND LEFT TOTAL HIP ARTHROPLASTY ANTERIOR APPROACH (Left) HARDWARE REMOVAL (Left) Patient reports pain as moderate.  Better progress with therapy and feeling better after transfusion.  Objective: Vital signs in last 24 hours: Temp:  [98 F (36.7 C)-98.9 F (37.2 C)] 98 F (36.7 C) (04/17 0542) Pulse Rate:  [96-108] 96 (04/17 0542) Resp:  [16-18] 18 (04/17 0542) BP: (99-115)/(49-67) 109/55 mmHg (04/17 0542) SpO2:  [94 %-100 %] 98 % (04/17 0542)  Intake/Output from previous day: 04/16 0701 - 04/17 0700 In: 1377.5 [P.O.:720; Blood:657.5] Out: 1075 [Urine:1075] Intake/Output this shift:     Recent Labs  06/07/13 1705 06/08/13 0404 06/09/13 0440  HGB 11.9* 8.9* 6.7*    Recent Labs  06/08/13 0404 06/09/13 0440  WBC 8.5 13.2*  RBC 3.07* 2.31*  HCT 26.3* 19.6*  PLT 125* PLATELET CLUMPS NOTED ON SMEAR, COUNT APPEARS DECREASED    Recent Labs  06/07/13 1705 06/08/13 0404  NA 136* 137  K 4.4 5.4*  CL  --  103  CO2  --  21  BUN  --  16  CREATININE  --  0.86  GLUCOSE  --  156*  CALCIUM  --  8.2*   No results found for this basename: LABPT, INR,  in the last 72 hours  Sensation intact distally Intact pulses distally Dorsiflexion/Plantar flexion intact Incision: scant drainage  Assessment/Plan: 3 Days Post-Op Procedure(s) (LRB): REMOVE HARDWARE LEFT HIP AND LEFT TOTAL HIP ARTHROPLASTY ANTERIOR APPROACH (Left) HARDWARE REMOVAL (Left) Up with therapy Discharge home with home health  Mcarthur Rossetti 06/10/2013, 7:04 AM

## 2013-06-10 NOTE — Progress Notes (Signed)
Physical Therapy Treatment Patient Details Name: Duane Richardson MRN: [id number] DOB: [birth year] Today's Date: 06/10/2013    History of Present Illness Admitted for L THA Direct Anterior approach and hardware removal    PT Comments    Patient making gains with mobility and gait.  Patient has w/c at home and reports brother can get him from the car to the house using w/c.  Patient to have f/u HHPT at discharge.  Follow Up Recommendations  Home health PT;Supervision/Assistance - 24 hour     Equipment Recommendations  None recommended by PT    Recommendations for Other Services       Precautions / Restrictions Precautions Precautions: Fall Restrictions Weight Bearing Restrictions: Yes LLE Weight Bearing: Weight bearing as tolerated    Mobility  Bed Mobility Overal bed mobility: Needs Assistance Bed Mobility: Supine to Sit     Supine to sit: Min assist     General bed mobility comments: Cues for technique, to scoot toward EOB before sitting.  Assist to move LLE off of bed.  Patient able to push himself up to sitting using BUE's on bed.  Practiced patient moving to sitting with HOB elevated - has hospital bed at home.  Transfers Overall transfer level: Needs assistance Equipment used: Rolling walker (2 wheeled) Transfers: Sit to/from Stand Sit to Stand: Min assist;From elevated surface         General transfer comment: Assist to rise to standing and for balance.  Instructed patient to elevated hospital bed to assist with standing.  Ambulation/Gait Ambulation/Gait assistance: Min assist Ambulation Distance (Feet): 24 Feet Assistive device: Rolling walker (2 wheeled) Gait Pattern/deviations: Step-to pattern;Decreased stance time - right;Decreased step length - left;Antalgic;Trunk flexed Gait velocity: Decreased Gait velocity interpretation: Below normal speed for age/gender General Gait Details: Verbal cues for safe use of RW and gait sequence.   Stairs             Wheelchair Mobility    Modified Rankin (Stroke Patients Only)       Balance                                    Cognition Arousal/Alertness: Awake/alert Behavior During Therapy: WFL for tasks assessed/performed Overall Cognitive Status: Within Functional Limits for tasks assessed                      Exercises      General Comments        Pertinent Vitals/Pain     Home Living                      Prior Function            PT Goals (current goals can now be found in the care plan section) Progress towards PT goals: Progressing toward goals    Frequency  7X/week    PT Plan Current plan remains appropriate    Co-evaluation             End of Session Equipment Utilized During Treatment: Gait belt Activity Tolerance: Patient limited by fatigue Patient left: in chair;with call bell/phone within reach     Time: 1314-1338 PT Time Calculation (min): 24 min  Charges:  $Gait Training: 8-22 mins $Therapeutic Activity: 8-22 mins                    G Codes:  Despina Pole 06/10/2013, 7:23 PM Carita Pian. Sanjuana Kava, [place] Pager [phone]

## 2013-06-10 NOTE — Progress Notes (Signed)
Physical Therapy Treatment Patient Details Name: Duane Richardson MRN: [id number] DOB: 12-Nov-1955 Today's Date: 07/05/13    History of Present Illness Admitted for L THA Direct Anterior approach and hardware removal    PT Comments    Patient making progress with mobility.  Follow Up Recommendations  Home health PT;Supervision/Assistance - 24 hour     Equipment Recommendations  None recommended by PT    Recommendations for Other Services       Precautions / Restrictions Precautions Precautions: Fall Restrictions Weight Bearing Restrictions: Yes LLE Weight Bearing: Weight bearing as tolerated    Mobility  Bed Mobility Overal bed mobility: Needs Assistance Bed Mobility: Supine to Sit     Supine to sit: Min assist     General bed mobility comments: Cues for technique, to scoot toward EOB before sitting.  Assist to move LLE off of bed.  Patient able to push himself up to sitting using BUE's on bed.  Transfers Overall transfer level: Needs assistance Equipment used: Rolling walker (2 wheeled) Transfers: Sit to/from Stand Sit to Stand: Min assist         General transfer comment: Cues for hand placement and technique.  Assist to rise to standing. Minimal dizziness noted today, resolving after several seconds standing.  Ambulation/Gait Ambulation/Gait assistance: Min assist Ambulation Distance (Feet): 3 Feet Assistive device: Rolling walker (2 wheeled) Gait Pattern/deviations: Step-to pattern;Decreased stance time - left;Decreased step length - right;Decreased weight shift to left;Antalgic     General Gait Details: Verbal cues for safe use of RW and gait sequence.   Stairs            Wheelchair Mobility    Modified Rankin (Stroke Patients Only)       Balance                                    Cognition Arousal/Alertness: Awake/alert Behavior During Therapy: WFL for tasks assessed/performed Overall Cognitive Status: Within  Functional Limits for tasks assessed                      Exercises Total Joint Exercises Ankle Circles/Pumps: AROM;Both;10 reps;Seated Quad Sets: AROM;Left;10 reps;Seated Gluteal Sets: AROM;Both;10 reps;Seated Heel Slides: AROM;Left;10 reps;Seated    General Comments        Pertinent Vitals/Pain     Home Living                      Prior Function            PT Goals (current goals can now be found in the care plan section) Progress towards PT goals: Progressing toward goals    Frequency  7X/week    PT Plan Current plan remains appropriate    Co-evaluation             End of Session Equipment Utilized During Treatment: Gait belt Activity Tolerance: Patient limited by pain;Patient limited by fatigue Patient left: in chair;with call bell/phone within reach     Time: 1032-1055 PT Time Calculation (min): 23 min  Charges:  $Therapeutic Activity: 23-37 mins                    G Codes:      Despina Pole Jul 05, 2013, 12:34 PM Carita Pian. Sanjuana Kava, Duane Richardson Pager [phone]

## 2013-06-10 NOTE — Discharge Summary (Signed)
Patient ID: Duane Richardson MRN: [id number] DOB/AGE: 1956-02-21 58 y.o.  Admit date: 06/07/2013 Discharge date: 06/10/2013  Admission Diagnoses:  Principal Problem:   Arthritis of left hip Active Problems:   Status post THR (total hip replacement)   Discharge Diagnoses:  Same  Past Medical History  Diagnosis Date  . Depression   . Elevated PSA   . Arthritis   . Hyperlipidemia   . Cataract   . Anxiety   . Bipolar 1 disorder   . ETOH abuse     quit Oct 2013, then relapsed, as of Dec 2013 now has abstained X 1 month  . COPD (chronic obstructive pulmonary disease)   . Asthma   . Allergy   . Hypertension   . GERD (gastroesophageal reflux disease)     Surgeries: Procedure(s): REMOVE HARDWARE LEFT HIP AND LEFT TOTAL HIP ARTHROPLASTY ANTERIOR APPROACH HARDWARE REMOVAL on 06/07/2013   Consultants:    Discharged Condition: Improved  Hospital Course: Duane Richardson is an 58 y.o. male who was admitted 06/07/2013 for operative treatment ofArthritis of left hip. Patient has severe unremitting pain that affects sleep, daily activities, and work/hobbies. After pre-op clearance the patient was taken to the operating room on 06/07/2013 and underwent  Procedure(s): REMOVE HARDWARE LEFT HIP AND LEFT TOTAL HIP ARTHROPLASTY ANTERIOR APPROACH HARDWARE REMOVAL.    Patient was given perioperative antibiotics: Anti-infectives   Start     Dose/Rate Route Frequency Ordered Stop   06/07/13 2100  ceFAZolin (ANCEF) IVPB 1 g/50 mL premix     1 g 100 mL/hr over 30 Minutes Intravenous Every 6 hours 06/07/13 2003 06/08/13 0546   06/07/13 0600  ceFAZolin (ANCEF) IVPB 2 g/50 mL premix     2 g 100 mL/hr over 30 Minutes Intravenous On call to O.R. 06/06/13 1354 06/07/13 1420       Patient was given sequential compression devices, early ambulation, and chemoprophylaxis to prevent DVT.  Patient benefited maximally from hospital stay and there were no complications.  He did receive a transfusion due to  symptomatic acute blood loss anemia and responded well.  Recent vital signs: Patient Vitals for the past 24 hrs:  BP Temp Temp src Pulse Resp SpO2  06/10/13 0542 109/55 mmHg 98 F (36.7 C) - 96 18 98 %  06/09/13 2010 108/57 mmHg 98.9 F (37.2 C) - 105 18 94 %  06/09/13 1537 115/56 mmHg 98.7 F (37.1 C) Oral 104 18 97 %  06/09/13 1257 99/49 mmHg 98.2 F (36.8 C) Oral 99 18 99 %  06/09/13 1232 107/62 mmHg 98.3 F (36.8 C) Oral 103 18 97 %  06/09/13 1110 109/60 mmHg 98.5 F (36.9 C) - 108 18 95 %  06/09/13 1010 113/67 mmHg - - - - -  06/09/13 0755 108/62 mmHg 98.2 F (36.8 C) - 102 18 97 %  06/09/13 0736 101/62 mmHg 98.1 F (36.7 C) Oral 108 16 100 %     Recent laboratory studies:  Recent Labs  06/07/13 1705 06/08/13 0404 06/09/13 0440  WBC  --  8.5 13.2*  HGB 11.9* 8.9* 6.7*  HCT 35.0* 26.3* 19.6*  PLT  --  125* PLATELET CLUMPS NOTED ON SMEAR, COUNT APPEARS DECREASED  NA 136* 137  --   K 4.4 5.4*  --   CL  --  103  --   CO2  --  21  --   BUN  --  16  --   CREATININE  --  0.86  --   GLUCOSE  --  156*  --   CALCIUM  --  8.2*  --      Discharge Medications:     Medication List         amLODipine 10 MG tablet  Commonly known as:  NORVASC  Take 1 tablet (10 mg total) by mouth daily.     aspirin 325 MG EC tablet  Take 1 tablet (325 mg total) by mouth 2 (two) times daily after a meal.     CERTAVITE/ANTIOXIDANTS PO  Take 1 tablet by mouth daily.     DSS 100 MG Caps  Take 100 mg by mouth 2 (two) times daily.     ferrous sulfate 325 (65 FE) MG tablet  Take 325 mg by mouth daily with breakfast.     ICY HOT PAIN RELIEVING EX  Apply 1 application topically as needed (leg pain).     omeprazole 20 MG capsule  Commonly known as:  PRILOSEC  Take 1 capsule (20 mg total) by mouth daily.     oxyCODONE-acetaminophen 5-325 MG per tablet  Commonly known as:  ROXICET  Take 1-2 tablets by mouth every 4 (four) hours as needed for severe pain.     PROAIR HFA 108 (90  BASE) MCG/ACT inhaler  Generic drug:  albuterol  INHALE 2 PUFFS INTO THE LUNGS EVERY 4 TO 6 HOURS AS NEEDED.     sertraline 50 MG tablet  Commonly known as:  ZOLOFT  Take 50 mg by mouth 3 (three) times daily.     tamsulosin 0.4 MG Caps capsule  Commonly known as:  FLOMAX  Take 1 capsule (0.4 mg total) by mouth daily.        Diagnostic Studies: Dg Hip Operative Left  06/07/2013   CLINICAL DATA:  Total hip arthroplasty  EXAM: OPERATIVE LEFT HIP  COMPARISON:  Prior operative radiographs involving the left proximal femur 11/24/2012  FINDINGS: Two single intraoperative spot radiographs demonstrate surgical changes of left total hip arthroplasty. The prior dynamic hip screw construct is been removed. No evidence of immediate hardware complication. Residual post traumatic deformity related to prior intertrochanteric fracture. No unexpected retained foreign body.  IMPRESSION: Intraoperative images obtained during left total hip arthroplasty.   Electronically Signed   By: Jacqulynn Cadet M.D.   On: 06/07/2013 18:49   Dg Pelvis Portable  06/07/2013   CLINICAL DATA:  Postop left total hip repair  EXAM: PORTABLE LEFT HIP - 1 VIEW; PORTABLE PELVIS 1-2 VIEWS  COMPARISON:  None.  FINDINGS: Left total hip arthroplasty without hardware failure or complication. No dislocation. There are postsurgical changes in the soft tissues overlying the proximal left hip.  The right hip is unremarkable.  The SI joints are normal.  IMPRESSION: Left total hip arthroplasty.   Electronically Signed   By: Kathreen Devoid   On: 06/07/2013 21:23   Dg Hip Portable 1 View Left  06/07/2013   CLINICAL DATA:  Postop left total hip repair  EXAM: PORTABLE LEFT HIP - 1 VIEW; PORTABLE PELVIS 1-2 VIEWS  COMPARISON:  None.  FINDINGS: Left total hip arthroplasty without hardware failure or complication. No dislocation. There are postsurgical changes in the soft tissues overlying the proximal left hip.  The right hip is unremarkable.  The SI  joints are normal.  IMPRESSION: Left total hip arthroplasty.   Electronically Signed   By: Kathreen Devoid   On: 06/07/2013 21:23    Disposition: 01-Home or Self Care      Discharge Orders   Future Orders Complete By  Expires   Call MD / Call 911  As directed    Constipation Prevention  As directed    Diet - low sodium heart healthy  As directed    Discharge instructions  As directed    Discharge patient  As directed    Increase activity slowly as tolerated  As directed       Follow-up Information   Follow up with [facility]. (Someone from [place] will contact you concerning start date and time for physical therapy.)    Contact information:   [address] [place] [address] [phone]       Follow up with Mcarthur Rossetti, MD In 2 weeks.   Specialty:  Orthopedic Surgery   Contact information:   [place] [address] [phone]        Signed: Mcarthur Rossetti 06/10/2013, 7:08 AM

## 2013-06-10 NOTE — Progress Notes (Signed)
Patient d/c to home, IV removed, prescriptions given, instructions reviewed. 

## 2013-07-12 ENCOUNTER — Encounter: Payer: Self-pay | Admitting: Family Medicine

## 2013-07-12 ENCOUNTER — Ambulatory Visit (INDEPENDENT_AMBULATORY_CARE_PROVIDER_SITE_OTHER): Payer: Medicaid Other | Admitting: Family Medicine

## 2013-07-12 VITALS — BP 110/84 | HR 88 | Temp 97.1°F | Resp 16 | Ht 73.0 in | Wt 158.0 lb

## 2013-07-12 DIAGNOSIS — R29898 Other symptoms and signs involving the musculoskeletal system: Secondary | ICD-10-CM

## 2013-07-12 NOTE — Progress Notes (Signed)
Subjective:    Patient ID: Duane Richardson, male    DOB: 1955-03-24, 58 y.o.   MRN: [id number]  HPI Patient underwent a left hip replacement 06/07/2013. Since that time he complains of weakness in his legs. He is on twice. He is using a rapid to ambulate around his home. Physical therapy is working with the patient in 13 weeks postoperatively. He is requesting a hoveround scooter.  Patient has 5 out of 5 strength with knee flexion and extension ankle flexion and extension. He has pain with hip flexion and extension. He is able to lateral using a walker. He also has the strength in his arms to navigate and operate a manual wheelchair. He specifically wants a power scooter to drive to go to grocery store once a week and to go to drug store to get his medication.  He should not be using the power scooter around the home to help with his ADLs. Upon further questioning the distance to the grocery store and his pharmacy is more than 3 miles.   Past Medical History  Diagnosis Date  . Depression   . Elevated PSA   . Arthritis   . Hyperlipidemia   . Cataract   . Anxiety   . Bipolar 1 disorder   . ETOH abuse     quit Oct 2013, then relapsed, as of Dec 2013 now has abstained X 1 month  . COPD (chronic obstructive pulmonary disease)   . Asthma   . Allergy   . Hypertension   . GERD (gastroesophageal reflux disease)    Current Outpatient Prescriptions on File Prior to Visit  Medication Sig Dispense Refill  . amLODipine (NORVASC) 10 MG tablet Take 1 tablet (10 mg total) by mouth daily.  90 tablet  3  . aspirin EC 325 MG EC tablet Take 1 tablet (325 mg total) by mouth 2 (two) times daily after a meal.  30 tablet  0  . ferrous sulfate 325 (65 FE) MG tablet Take 325 mg by mouth daily with breakfast.      . omeprazole (PRILOSEC) 20 MG capsule Take 1 capsule (20 mg total) by mouth daily.  90 capsule  3  . oxyCODONE-acetaminophen (ROXICET) 5-325 MG per tablet Take 1-2 tablets by mouth every 4 (four) hours as  needed for severe pain.  60 tablet  0  . PROAIR HFA 108 (90 BASE) MCG/ACT inhaler INHALE 2 PUFFS INTO THE LUNGS EVERY 4 TO 6 HOURS AS NEEDED.  8.5 g  4   No current facility-administered medications on file prior to visit.   No Known Allergies History   Social History  . Marital Status: Single    Spouse Name: N/A    Number of Children: 0  . Years of Education: N/A   Occupational History  . disabled    Social History Main Topics  . Smoking status: Current Every Day Smoker -- 1.00 packs/day for 30 years    Types: Cigars, Cigarettes  . Smokeless tobacco: Not on file  . Alcohol Use: 18.0 oz/week    30 Glasses of wine per week     Comment: history of  about a liter of wine per day-before that  . Drug Use: No  . Sexual Activity: Yes    Birth Control/ Protection: None   Other Topics Concern  . Not on file   Social History Narrative   In prison x 2, 14rs, 29 mo   Out for several yrs   Lives alone  Review of Systems  All other systems reviewed and are negative.      Objective:   Physical Exam  Vitals reviewed. Constitutional: He is oriented to person, place, and time.  Cardiovascular: Normal rate and regular rhythm.   Pulmonary/Chest: Effort normal and breath sounds normal.  Neurological: He is alert and oriented to person, place, and time. He displays normal reflexes. No cranial nerve deficit. He exhibits normal muscle tone. Coordination normal.          Assessment & Plan:  1. Leg weakness, bilateral There is no evidence of objective weakness in his legs.  I believe the patient would benefit from physical therapy to improve his strength and his balance. Patient does not meet criteria for a power scooter. He will not be using it to navigate his home or to perform his ADLs. He basically wants to scooter for transportation outside the home. I explained to the patient that this is not appropriate or  covered by his insurance.  I believe this patient can  perform activities he is describing using a regular wheelchair if necessary.

## 2013-07-19 ENCOUNTER — Ambulatory Visit: Payer: Medicaid Other | Admitting: Family Medicine

## 2013-07-28 ENCOUNTER — Ambulatory Visit (HOSPITAL_COMMUNITY)
Admission: RE | Admit: 2013-07-28 | Discharge: 2013-07-28 | Disposition: A | Discharge status: Home / Self Care | Payer: Medicaid Other | Source: Ambulatory Visit | Attending: Orthopaedic Surgery | Admitting: Orthopaedic Surgery

## 2013-07-28 NOTE — Progress Notes (Signed)
  Patient Details  Name: Duane Richardson MRN: [id number] Date of Birth: [birth year]  Today's Date: 07/28/2013 Time:  24- 68  Pt was referred for outpatient therapy after having a total hip replacement.  Pt's insurance only covers eight therapy visits for home health and out-patient combined.  He has already had his eight home health visits and therefore has no insurance coverage for out patient therapy.  Pt was explained this and the pt states that he wants to continue to work on his exercises at home.    Leeroy Cha 07/28/2013, 1:17 PM

## 2013-09-28 ENCOUNTER — Other Ambulatory Visit: Payer: Self-pay | Admitting: Physician Assistant

## 2013-09-28 NOTE — Telephone Encounter (Signed)
Refill appropriate and filled per protocol. 

## 2014-03-09 ENCOUNTER — Encounter (HOSPITAL_COMMUNITY): Payer: Self-pay | Admitting: Internal Medicine

## 2014-05-02 ENCOUNTER — Other Ambulatory Visit: Payer: Self-pay | Admitting: Physician Assistant

## 2014-05-02 ENCOUNTER — Encounter: Payer: Self-pay | Admitting: Family Medicine

## 2014-05-02 NOTE — Telephone Encounter (Signed)
Medication refill for one time only.  Patient needs to be seen.  Letter sent for patient to call and schedule 

## 2014-06-08 ENCOUNTER — Other Ambulatory Visit: Payer: Self-pay | Admitting: Physician Assistant

## 2014-06-08 NOTE — Telephone Encounter (Signed)
Medication refilled per protocol. 

## 2014-07-12 ENCOUNTER — Other Ambulatory Visit: Payer: Medicaid Other

## 2014-07-12 DIAGNOSIS — Z125 Encounter for screening for malignant neoplasm of prostate: Secondary | ICD-10-CM

## 2014-07-12 DIAGNOSIS — Z0000 Encounter for general adult medical examination without abnormal findings: Secondary | ICD-10-CM

## 2014-07-12 DIAGNOSIS — I10 Essential (primary) hypertension: Secondary | ICD-10-CM

## 2014-07-12 DIAGNOSIS — N400 Enlarged prostate without lower urinary tract symptoms: Secondary | ICD-10-CM

## 2014-07-12 LAB — COMPREHENSIVE METABOLIC PANEL
ALT: 42 U/L (ref 0–53)
AST: 101 U/L — ABNORMAL HIGH (ref 0–37)
Albumin: 4.1 g/dL (ref 3.5–5.2)
Alkaline Phosphatase: 104 U/L (ref 39–117)
BUN: 8 mg/dL (ref 6–23)
CO2: 25 mEq/L (ref 19–32)
Calcium: 8.7 mg/dL (ref 8.4–10.5)
Chloride: 102 mEq/L (ref 96–112)
Creat: 0.71 mg/dL (ref 0.50–1.35)
Glucose, Bld: 89 mg/dL (ref 70–99)
Potassium: 4.1 mEq/L (ref 3.5–5.3)
Sodium: 137 mEq/L (ref 135–145)
Total Bilirubin: 1 mg/dL (ref 0.2–1.2)
Total Protein: 7.6 g/dL (ref 6.0–8.3)

## 2014-07-12 LAB — LIPID PANEL
Cholesterol: 180 mg/dL (ref 0–200)
HDL: 45 mg/dL (ref >=40)
LDL Cholesterol: 86 mg/dL (ref 0–99)
Total CHOL/HDL Ratio: 4 Ratio
Triglycerides: 247 mg/dL — ABNORMAL HIGH (ref <150)
VLDL: 49 mg/dL — ABNORMAL HIGH (ref 0–40)

## 2014-07-13 LAB — CBC WITH DIFFERENTIAL/PLATELET
Basophils Absolute: 0.1 10*3/uL (ref 0.0–0.1)
Basophils Relative: 2 % — ABNORMAL HIGH (ref 0–1)
Eosinophils Absolute: 0.1 10*3/uL (ref 0.0–0.7)
Eosinophils Relative: 2 % (ref 0–5)
HCT: 46.5 % (ref 39.0–52.0)
Hemoglobin: 16.1 g/dL (ref 13.0–17.0)
Lymphocytes Relative: 52 % — ABNORMAL HIGH (ref 12–46)
Lymphs Abs: 1.3 10*3/uL (ref 0.7–4.0)
MCH: 31.8 pg (ref 26.0–34.0)
MCHC: 34.6 g/dL (ref 30.0–36.0)
MCV: 91.7 fL (ref 78.0–100.0)
MPV: 11.5 fL (ref 8.6–12.4)
Monocytes Absolute: 0.8 10*3/uL (ref 0.1–1.0)
Monocytes Relative: 30 % — ABNORMAL HIGH (ref 3–12)
Neutro Abs: 0.4 10*3/uL — ABNORMAL LOW (ref 1.7–7.7)
Neutrophils Relative %: 14 % — ABNORMAL LOW (ref 43–77)
Platelets: 132 10*3/uL — ABNORMAL LOW (ref 150–400)
RBC: 5.07 MIL/uL (ref 4.22–5.81)
RDW: 13.6 % (ref 11.5–15.5)
WBC: 2.5 10*3/uL — ABNORMAL LOW (ref 4.0–10.5)

## 2014-07-13 LAB — PATHOLOGIST SMEAR REVIEW

## 2014-07-13 LAB — PSA: PSA: 3.52 ng/mL (ref <=4.00)

## 2014-07-17 ENCOUNTER — Encounter: Payer: Self-pay | Admitting: Family Medicine

## 2014-07-17 ENCOUNTER — Telehealth: Payer: Self-pay | Admitting: *Deleted

## 2014-07-17 ENCOUNTER — Ambulatory Visit (INDEPENDENT_AMBULATORY_CARE_PROVIDER_SITE_OTHER): Payer: Medicaid Other | Admitting: Family Medicine

## 2014-07-17 VITALS — BP 142/92 | HR 80 | Temp 98.1°F | Resp 20 | Ht 73.0 in | Wt 153.0 lb

## 2014-07-17 DIAGNOSIS — R634 Abnormal weight loss: Secondary | ICD-10-CM

## 2014-07-17 DIAGNOSIS — R945 Abnormal results of liver function studies: Secondary | ICD-10-CM

## 2014-07-17 DIAGNOSIS — R7989 Other specified abnormal findings of blood chemistry: Secondary | ICD-10-CM | POA: Diagnosis not present

## 2014-07-17 DIAGNOSIS — L309 Dermatitis, unspecified: Secondary | ICD-10-CM

## 2014-07-17 DIAGNOSIS — Z0000 Encounter for general adult medical examination without abnormal findings: Secondary | ICD-10-CM | POA: Diagnosis not present

## 2014-07-17 DIAGNOSIS — F10230 Alcohol dependence with withdrawal, uncomplicated: Secondary | ICD-10-CM | POA: Diagnosis not present

## 2014-07-17 DIAGNOSIS — F10930 Alcohol use, unspecified with withdrawal, uncomplicated: Secondary | ICD-10-CM

## 2014-07-17 MED ORDER — ALPRAZOLAM 1 MG PO TABS: ORAL_TABLET | ORAL | Status: DC

## 2014-07-17 MED ORDER — SERTRALINE HCL 50 MG PO TABS: 50.0000 mg | ORAL_TABLET | Freq: Every day | ORAL | Status: DC

## 2014-07-17 MED ORDER — FLUOCINONIDE-E 0.05 % EX CREA: 1.0000 "application " | TOPICAL_CREAM | Freq: Two times a day (BID) | CUTANEOUS | Status: DC

## 2014-07-17 NOTE — Progress Notes (Signed)
Subjective:    Patient ID: Duane Richardson, male    DOB: 02-19-1956, 59 y.o.   MRN: [id number]  HPI Patient was scheduled today for complete physical exam. However on encounter, the patient is tremulous. He states that he hasn't drank in over 3 days. He has a history of drinking approximately 1 gallon of wine a day. He denies any hallucinations although he does have an elevated heart rate and an elevated blood pressure. He is not delirious but I am concerned that he is going through alcohol withdrawal. The patient states he has no desire to continue drinking and would like to wean himself away from alcohol in a safe fashion. Of note he also has mild leukopenia on a CBC along with thrombocytopenia. There is also elevated AST. I believe this is likely due to his alcohol abuse and poor diet. However the patient reports increasing weakness, 5 pounds of weight loss, early satiety, and generalized fatigue  As colonoscopy and EGD were performed 2014 revealed no malignancy. PSA is up-to-date and is within normal limits. The patient does smoke and is due for a chest x-ray. He does report a cough Past Medical History  Diagnosis Date  . Depression   . Elevated PSA   . Arthritis   . Hyperlipidemia   . Cataract   . Anxiety   . Bipolar 1 disorder   . ETOH abuse     quit Oct 2013, then relapsed, as of Dec 2013 now has abstained X 1 month  . COPD (chronic obstructive pulmonary disease)   . Asthma   . Allergy   . Hypertension   . GERD (gastroesophageal reflux disease)    Past Surgical History  Procedure Laterality Date  . Colonoscopy  12/21/09    TML:[id number] papilla otherwise normal/ pancolonic diverticula/mutiple colonic poylps  . Spine surgery    . Back surgery      lunbar  . Colonoscopy with esophagogastroduodenoscopy (egd) N/A 10/20/2012    Procedure: COLONOSCOPY WITH ESOPHAGOGASTRODUODENOSCOPY (EGD);  Surgeon: Daneil Dolin, MD;  [facility];  Service: Endoscopy;  Laterality: N/A;   10;15  . Orif hip fracture Left 11/24/2012    Procedure: OPEN REDUCTION INTERNAL FIXATION HIP;  Surgeon: Sanjuana Kava, MD;  Location: [facility];  Service: Orthopedics;  Laterality: Left;  . Cystoscopy N/A 01/31/2013    Procedure: CYSTOSCOPY FLEXIBLE;  Surgeon: Marissa Nestle, MD;  Location: [facility];  Service: Urology;  Laterality: N/A;  I would like to do this around 1 pm on monday.   . Transurethral resection of prostate N/A 03/22/2013    Procedure: TRANSURETHRAL RESECTION OF THE PROSTATE (TURP);  Surgeon: Marissa Nestle, MD;  Location: [facility];  Service: Urology;  Laterality: N/A;  . Total hip arthroplasty Left 06/07/2013    Procedure: REMOVE HARDWARE LEFT HIP AND LEFT TOTAL HIP ARTHROPLASTY ANTERIOR APPROACH;  Surgeon: Mcarthur Rossetti, MD;  Location: [place];  Service: Orthopedics;  Laterality: Left;  . Hardware removal Left 06/07/2013    Procedure: HARDWARE REMOVAL;  Surgeon: Mcarthur Rossetti, MD;  Location: [place];  Service: Orthopedics;  Laterality: Left;   Current Outpatient Prescriptions on File Prior to Visit  Medication Sig Dispense Refill  . aspirin EC 325 MG EC tablet Take 1 tablet (325 mg total) by mouth 2 (two) times daily after a meal. 30 tablet 0  . omeprazole (PRILOSEC) 20 MG capsule Take 1 capsule (20 mg total) by mouth daily. 90 capsule 3  . PROAIR HFA 108 (90  BASE) MCG/ACT inhaler INHALE 2 PUFFS INTO THE LUNGS EVERY 4 TO 6 HOURS AS NEEDED. 8.5 g 1  . amLODipine (NORVASC) 10 MG tablet Take 1 tablet (10 mg total) by mouth daily. (Patient not taking: Reported on 07/17/2014) 90 tablet 3   No current facility-administered medications on file prior to visit.   No Known Allergies History   Social History  . Marital Status: Single    Spouse Name: N/A  . Number of Children: 0  . Years of Education: N/A   Occupational History  . disabled    Social History Main Topics  . Smoking status: Current Every Day Smoker -- 1.00 packs/day for 30 years    Types: Cigars,  Cigarettes  . Smokeless tobacco: Not on file  . Alcohol Use: 18.0 oz/week    30 Glasses of wine per week     Comment: history of  about a liter of wine per day-before that  . Drug Use: No  . Sexual Activity: Yes    Birth Control/ Protection: None   Other Topics Concern  . Not on file   Social History Narrative   In prison x 2, 14rs, 29 mo   Out for several yrs   Lives alone         Family History  Problem Relation Age of Onset  . Colon cancer Neg Hx   . Mental illness Sister       Review of Systems  All other systems reviewed and are negative.      Objective:   Physical Exam  Constitutional: He is oriented to person, place, and time. He appears well-developed and well-nourished. No distress.  HENT:  Head: Normocephalic and atraumatic.  Right Ear: External ear normal.  Left Ear: External ear normal.  Nose: Nose normal.  Mouth/Throat: Oropharynx is clear and moist. No oropharyngeal exudate.  Eyes: Conjunctivae and EOM are normal. Pupils are equal, round, and reactive to light.  Neck: Normal range of motion. Neck supple. No JVD present. No thyromegaly present.  Cardiovascular: Normal rate, regular rhythm and normal heart sounds.  Exam reveals no gallop and no friction rub.   No murmur heard. Pulmonary/Chest: Effort normal and breath sounds normal. No respiratory distress. He has no wheezes. He has no rales. He exhibits no tenderness.  Abdominal: Soft. Bowel sounds are normal. He exhibits no distension and no mass. There is no tenderness. There is no rebound and no guarding.  Musculoskeletal: He exhibits no edema.  Lymphadenopathy:    He has no cervical adenopathy.  Neurological: He is alert and oriented to person, place, and time. He has normal reflexes. He displays tremor. He displays no atrophy and normal reflexes. No cranial nerve deficit. He exhibits normal muscle tone. He displays no seizure activity. Coordination normal.  Skin: Rash noted. He is not diaphoretic.  There is erythema.  Vitals reviewed.  patient has an linear erythematous papular rash on his left upper chest. He also has an erythematous papular patch on his right bicep. They're also erythematous papules around his navel.        Assessment & Plan:  Dermatitis - Plan: fluocinonide-emollient (LIDEX-E) 0.05 % cream  Alcohol withdrawal, uncomplicated - Plan: ALPRAZolam (XANAX) 1 MG tablet  Loss of weight - Plan: DG Chest 2 View, HIV antibody  Elevated LFTs - Plan: Hepatitis panel, acute  Routine general medical examination at a health care facility - Plan: sertraline (ZOLOFT) 50 MG tablet  I believe the patient has an allergic dermatitis. I recommended treating  this with Lidex cream once daily for the next week. If persistent return for a biopsy. I believe the patient is going through alcohol withdrawal. I will give him a benzodiazepine to reduce the chances delirium tremens. I gave the patient a prescription for Xanax and wrote out a taper that leads the patient on Xanax gradually over a period of 4 days. I explained to the patient if he begins hallucinating or becomes delirious he needs to go the hospital. Given his weight loss, his fatigue, and his early satiety and his elevated liver function test, I will check an acute hepatitis panel. I'll also check a chest x-ray as well as an HIV test. I will refill the patient's Zoloft he was taking from his psychiatrist that he no longer sees. Also schedule the patient for right upper quadrant ultrasound

## 2014-07-17 NOTE — Telephone Encounter (Signed)
pt is to be NPO after midnight, night before his exam, lmtrc on pt cell number to return my call, pt has appt scheduled for Friday May 27 at [facility]

## 2014-07-18 LAB — HIV ANTIBODY (ROUTINE TESTING W REFLEX): HIV 1&2 Ab, 4th Generation: NONREACTIVE

## 2014-07-18 LAB — HEPATITIS PANEL, ACUTE
HCV Ab: NEGATIVE
Hep A IgM: NONREACTIVE
Hep B C IgM: NONREACTIVE
Hepatitis B Surface Ag: NEGATIVE

## 2014-07-18 NOTE — Telephone Encounter (Signed)
Pt is aware of appt

## 2014-07-21 ENCOUNTER — Ambulatory Visit (HOSPITAL_COMMUNITY): Admission: RE | Admit: 2014-07-21 | Payer: Medicaid Other | Source: Ambulatory Visit

## 2014-08-17 ENCOUNTER — Other Ambulatory Visit: Payer: Self-pay | Admitting: Family Medicine

## 2014-08-17 ENCOUNTER — Other Ambulatory Visit: Payer: Self-pay | Admitting: Physician Assistant

## 2014-08-17 NOTE — Telephone Encounter (Signed)
Medication refilled per protocol. 

## 2014-08-18 ENCOUNTER — Emergency Department (HOSPITAL_COMMUNITY)
Admission: EM | Admit: 2014-08-18 | Discharge: 2014-08-18 | Disposition: A | Discharge status: Home / Self Care | Payer: Medicaid Other | Attending: Emergency Medicine | Admitting: Emergency Medicine

## 2014-08-18 ENCOUNTER — Encounter (HOSPITAL_COMMUNITY): Payer: Self-pay | Admitting: Emergency Medicine

## 2014-08-18 DIAGNOSIS — M1990 Unspecified osteoarthritis, unspecified site: Secondary | ICD-10-CM | POA: Diagnosis not present

## 2014-08-18 DIAGNOSIS — Z79899 Other long term (current) drug therapy: Secondary | ICD-10-CM | POA: Insufficient documentation

## 2014-08-18 DIAGNOSIS — J449 Chronic obstructive pulmonary disease, unspecified: Secondary | ICD-10-CM | POA: Insufficient documentation

## 2014-08-18 DIAGNOSIS — F319 Bipolar disorder, unspecified: Secondary | ICD-10-CM | POA: Diagnosis not present

## 2014-08-18 DIAGNOSIS — K219 Gastro-esophageal reflux disease without esophagitis: Secondary | ICD-10-CM | POA: Insufficient documentation

## 2014-08-18 DIAGNOSIS — I10 Essential (primary) hypertension: Secondary | ICD-10-CM | POA: Diagnosis not present

## 2014-08-18 DIAGNOSIS — R21 Rash and other nonspecific skin eruption: Secondary | ICD-10-CM | POA: Diagnosis present

## 2014-08-18 DIAGNOSIS — Z8639 Personal history of other endocrine, nutritional and metabolic disease: Secondary | ICD-10-CM | POA: Diagnosis not present

## 2014-08-18 DIAGNOSIS — Z720 Tobacco use: Secondary | ICD-10-CM | POA: Insufficient documentation

## 2014-08-18 DIAGNOSIS — Z7982 Long term (current) use of aspirin: Secondary | ICD-10-CM | POA: Diagnosis not present

## 2014-08-18 DIAGNOSIS — F419 Anxiety disorder, unspecified: Secondary | ICD-10-CM | POA: Insufficient documentation

## 2014-08-18 DIAGNOSIS — L259 Unspecified contact dermatitis, unspecified cause: Secondary | ICD-10-CM | POA: Diagnosis not present

## 2014-08-18 DIAGNOSIS — Z8669 Personal history of other diseases of the nervous system and sense organs: Secondary | ICD-10-CM | POA: Insufficient documentation

## 2014-08-18 MED ORDER — PREDNISONE 20 MG PO TABS: ORAL_TABLET | ORAL | Status: DC

## 2014-08-18 MED ORDER — ACYCLOVIR 400 MG PO TABS: 400.0000 mg | ORAL_TABLET | Freq: Every day | ORAL | Status: DC

## 2014-08-18 MED ORDER — DEXAMETHASONE SODIUM PHOSPHATE 10 MG/ML IJ SOLN
10.0000 mg | Freq: Once | INTRAMUSCULAR | Status: AC
  Administered 2014-08-18: 10 mg via INTRAMUSCULAR
  Filled 2014-08-18: qty 1

## 2014-08-18 MED ORDER — HYDROXYZINE HCL 25 MG PO TABS: 25.0000 mg | ORAL_TABLET | Freq: Four times a day (QID) | ORAL | Status: DC | PRN

## 2014-08-18 NOTE — Discharge Instructions (Signed)

## 2014-08-18 NOTE — ED Notes (Signed)
Patient complaining of generalized rash x 3 days. States he was treated by PCP and given fluocinonide cream but it is not helping.

## 2014-08-18 NOTE — ED Provider Notes (Addendum)
CSN: [id number]     Arrival date & time 08/18/14  9528 History  This chart was scribed for Orpah Greek, MD by Irene Pap, ED Scribe. This patient was seen in room [facility] and patient care was started at 9:02 AM.   Chief Complaint  Patient presents with  . Rash   The history is provided by the patient. No language interpreter was used.  HPI Comments: Duane Richardson is a 59 y.o. male who presents to the Emergency Department complaining of bilateral arm and torso rash onset 3 days ago. He states that he saw his doctor and was given steroid cream to no relief. He states that the rash is itchy and gradually worsening. He denies trouble swallowing or SOB. He denies being out in the woods. He denies history of DM.   Past Medical History  Diagnosis Date  . Depression   . Elevated PSA   . Arthritis   . Hyperlipidemia   . Cataract   . Anxiety   . Bipolar 1 disorder   . ETOH abuse     quit Oct 2013, then relapsed, as of Dec 2013 now has abstained X 1 month  . COPD (chronic obstructive pulmonary disease)   . Asthma   . Allergy   . Hypertension   . GERD (gastroesophageal reflux disease)    Past Surgical History  Procedure Laterality Date  . Colonoscopy  12/21/09    UXL:KGMW papilla otherwise normal/ pancolonic diverticula/mutiple colonic poylps  . Spine surgery    . Back surgery      lunbar  . Colonoscopy with esophagogastroduodenoscopy (egd) N/A 10/20/2012    Procedure: COLONOSCOPY WITH ESOPHAGOGASTRODUODENOSCOPY (EGD);  Surgeon: Daneil Dolin, MD;  [facility];  Service: Endoscopy;  Laterality: N/A;  10;15  . Orif hip fracture Left 11/24/2012    Procedure: OPEN REDUCTION INTERNAL FIXATION HIP;  Surgeon: Sanjuana Kava, MD;  Location: [facility];  Service: Orthopedics;  Laterality: Left;  . Cystoscopy N/A 01/31/2013    Procedure: CYSTOSCOPY FLEXIBLE;  Surgeon: Marissa Nestle, MD;  Location: [facility];  Service: Urology;  Laterality: N/A;  I would like to do this  around 1 pm on monday.   . Transurethral resection of prostate N/A 03/22/2013    Procedure: TRANSURETHRAL RESECTION OF THE PROSTATE (TURP);  Surgeon: Marissa Nestle, MD;  Location: [facility];  Service: Urology;  Laterality: N/A;  . Total hip arthroplasty Left 06/07/2013    Procedure: REMOVE HARDWARE LEFT HIP AND LEFT TOTAL HIP ARTHROPLASTY ANTERIOR APPROACH;  Surgeon: Mcarthur Rossetti, MD;  Location: [place];  Service: Orthopedics;  Laterality: Left;  . Hardware removal Left 06/07/2013    Procedure: HARDWARE REMOVAL;  Surgeon: Mcarthur Rossetti, MD;  Location: [place];  Service: Orthopedics;  Laterality: Left;   Family History  Problem Relation Age of Onset  . Colon cancer Neg Hx   . Mental illness Sister    History  Substance Use Topics  . Smoking status: Current Every Day Smoker -- 1.00 packs/day for 30 years    Types: Cigars, Cigarettes  . Smokeless tobacco: Not on file  . Alcohol Use: 18.0 oz/week    30 Glasses of wine per week     Comment: history of  about a liter of wine per day-before that    Review of Systems  HENT: Negative for trouble swallowing.   Respiratory: Negative for shortness of breath.   Skin: Positive for rash.  All other systems reviewed and are negative.  Allergies  Review of patient's allergies indicates no known allergies.  Home Medications   Prior to Admission medications   Medication Sig Start Date End Date Taking? Authorizing Provider  ALPRAZolam Duanne Moron) 1 MG tablet 1 pill every 6 hours for 2 days, then every 8 hrs for 2 days, then every 12 hours for 2 days then 1 pill a day for 2 days. 07/17/14   Susy Frizzle, MD  amLODipine (NORVASC) 10 MG tablet Take 1 tablet (10 mg total) by mouth daily. Patient not taking: Reported on 07/17/2014 09/01/12   Orlena Sheldon, PA-C  aspirin EC 325 MG EC tablet Take 1 tablet (325 mg total) by mouth 2 (two) times daily after a meal. 06/10/13   Mcarthur Rossetti, MD  fluocinonide-emollient (LIDEX-E) 0.05 %  cream APPLY TO AFFECTED AREA TWICE DAILY. 08/17/14   Susy Frizzle, MD  Menthol, Topical Analgesic, (BIOFREEZE EX) Apply topically.    Historical Provider, MD  omeprazole (PRILOSEC) 20 MG capsule Take 1 capsule (20 mg total) by mouth daily. 09/01/12   Mary B Dixon, PA-C  PROAIR HFA 108 (90 BASE) MCG/ACT inhaler INHALE 2 PUFFS INTO THE LUNGS EVERY 4 TO 6 HOURS AS NEEDED. 08/17/14   Susy Frizzle, MD  sertraline (ZOLOFT) 50 MG tablet Take 1 tablet (50 mg total) by mouth daily. 07/17/14   Susy Frizzle, MD   BP 137/98 mmHg  Pulse 85  Temp(Src) 98 F (36.7 C) (Oral)  Resp 14  Ht '5\' 11"'$  (1.803 m)  Wt 150 lb (68.04 kg)  BMI 20.93 kg/m2  SpO2 99%  Physical Exam  Constitutional: He is oriented to person, place, and time. He appears well-developed and well-nourished. No distress.  HENT:  Head: Normocephalic and atraumatic.  Right Ear: Hearing normal.  Left Ear: Hearing normal.  Nose: Nose normal.  Mouth/Throat: Oropharynx is clear and moist and mucous membranes are normal.  Eyes: Conjunctivae and EOM are normal. Pupils are equal, round, and reactive to light.  Neck: Normal range of motion. Neck supple.  Cardiovascular: Regular rhythm, S1 normal and S2 normal.  Exam reveals no gallop and no friction rub.   No murmur heard. Pulmonary/Chest: Effort normal and breath sounds normal. No respiratory distress. He exhibits no tenderness.  Abdominal: Soft. Normal appearance and bowel sounds are normal. There is no hepatosplenomegaly. There is no tenderness. There is no rebound, no guarding, no tenderness at McBurney's point and negative Murphy's sign. No hernia.  Musculoskeletal: Normal range of motion.  Neurological: He is alert and oriented to person, place, and time. He has normal strength. No cranial nerve deficit or sensory deficit. Coordination normal. GCS eye subscore is 4. GCS verbal subscore is 5. GCS motor subscore is 6.  Skin: Skin is warm, dry and intact. Rash (Scattered clusters of  small vesicles with erythematous base distributed randomly on torso and arms.) noted. No cyanosis.  Psychiatric: He has a normal mood and affect. His speech is normal and behavior is normal. Thought content normal.  Nursing note and vitals reviewed.   ED Course  Procedures (including critical care time) DIAGNOSTIC STUDIES: Oxygen Saturation is 99% on RA, normal by my interpretation.    COORDINATION OF CARE: 9:04 AM-Discussed treatment plan which includes cortisone steroid shot with pt at bedside and pt agreed to plan.   Labs Review Labs Reviewed - No data to display  Imaging Review No results found.   EKG Interpretation None      MDM   Final diagnoses:  None  contact dermatitis, cannot rule out cutaneous herpes  She presents with rash. Patient has been using topical steroid-induced without improvement. Patient reports that the rash is nonpainful, itchy. Morphology looks like contact dermatitis. There is a primarily vesicular morphology, however soft with erythematous base. Herpetic lesion possible. Testicles are confluent and clustered, does not resemble chickenpox. There is symmetric distribution of both sides of the body, not consistent with zoster. Will culture, empirically cover with acyclovir as well as prednisone taper.  I personally performed the services described in this documentation, which was scribed in my presence. The recorded information has been reviewed and is accurate.     Orpah Greek, MD 08/18/14 [id number]  Orpah Greek, MD 08/18/14 [phone]

## 2014-08-21 LAB — HERPES SIMPLEX VIRUS CULTURE: Culture: NOT DETECTED

## 2014-09-07 ENCOUNTER — Other Ambulatory Visit: Payer: Self-pay | Admitting: Family Medicine

## 2014-09-18 ENCOUNTER — Ambulatory Visit (INDEPENDENT_AMBULATORY_CARE_PROVIDER_SITE_OTHER): Payer: Medicaid Other | Admitting: Family Medicine

## 2014-09-18 ENCOUNTER — Encounter: Payer: Self-pay | Admitting: Family Medicine

## 2014-09-18 VITALS — BP 100/60 | HR 87 | Temp 98.4°F | Resp 18 | Ht 73.0 in | Wt 150.0 lb

## 2014-09-18 DIAGNOSIS — F329 Major depressive disorder, single episode, unspecified: Secondary | ICD-10-CM | POA: Diagnosis not present

## 2014-09-18 DIAGNOSIS — I10 Essential (primary) hypertension: Secondary | ICD-10-CM

## 2014-09-18 DIAGNOSIS — L309 Dermatitis, unspecified: Secondary | ICD-10-CM

## 2014-09-18 DIAGNOSIS — F32A Depression, unspecified: Secondary | ICD-10-CM

## 2014-09-18 MED ORDER — SERTRALINE HCL 50 MG PO TABS
50.0000 mg | ORAL_TABLET | Freq: Every day | ORAL | Status: DC
Start: 2014-09-18 — End: 2015-10-26

## 2014-09-18 MED ORDER — AMLODIPINE BESYLATE 10 MG PO TABS: 10.0000 mg | ORAL_TABLET | Freq: Every day | ORAL | Status: DC

## 2014-09-18 NOTE — Progress Notes (Signed)
Subjective:    Patient ID: Duane Richardson, male    DOB: 02/21/1956, 59 y.o.   MRN: [id number]  HPI Patient is here today for rash. He has an eczematous form rash on both elbows. There are skin colored and erythematous papules with excoriation over the lateral epicondyles. I have been treating this with lidex which helps the itching but the rash never goes away. It is been present for over a month. However he also has a rash on his left upper chest and his right lower back. This consists of erythematous to violaceous papules that have coalesced into plaques more consistent with lichen planus. Both rashes are extremely itchy. My topical steroidal creams have been ineffective. He is also going to the emergency room per his report and received sterilely shot that was ineffective. He is here today for further options. He is also requesting a refill on his Zoloft and his amlodipine. His blood pressure today is well controlled. Past Medical History  Diagnosis Date  . Depression   . Elevated PSA   . Arthritis   . Hyperlipidemia   . Cataract   . Anxiety   . Bipolar 1 disorder   . ETOH abuse     quit Oct 2013, then relapsed, as of Dec 2013 now has abstained X 1 month  . COPD (chronic obstructive pulmonary disease)   . Asthma   . Allergy   . Hypertension   . GERD (gastroesophageal reflux disease)    Past Surgical History  Procedure Laterality Date  . Colonoscopy  12/21/09    YQM:VHQI papilla otherwise normal/ pancolonic diverticula/mutiple colonic poylps  . Spine surgery    . Back surgery      lunbar  . Colonoscopy with esophagogastroduodenoscopy (egd) N/A 10/20/2012    Procedure: COLONOSCOPY WITH ESOPHAGOGASTRODUODENOSCOPY (EGD);  Surgeon: Daneil Dolin, MD;  [facility];  Service: Endoscopy;  Laterality: N/A;  10;15  . Orif hip fracture Left 11/24/2012    Procedure: OPEN REDUCTION INTERNAL FIXATION HIP;  Surgeon: Sanjuana Kava, MD;  Location: [facility];  Service: Orthopedics;   Laterality: Left;  . Cystoscopy N/A 01/31/2013    Procedure: CYSTOSCOPY FLEXIBLE;  Surgeon: Marissa Nestle, MD;  Location: [facility];  Service: Urology;  Laterality: N/A;  I would like to do this around 1 pm on monday.   . Transurethral resection of prostate N/A 03/22/2013    Procedure: TRANSURETHRAL RESECTION OF THE PROSTATE (TURP);  Surgeon: Marissa Nestle, MD;  Location: [facility];  Service: Urology;  Laterality: N/A;  . Total hip arthroplasty Left 06/07/2013    Procedure: REMOVE HARDWARE LEFT HIP AND LEFT TOTAL HIP ARTHROPLASTY ANTERIOR APPROACH;  Surgeon: Mcarthur Rossetti, MD;  Location: [facility];  Service: Orthopedics;  Laterality: Left;  . Hardware removal Left 06/07/2013    Procedure: HARDWARE REMOVAL;  Surgeon: Mcarthur Rossetti, MD;  Location: [place];  Service: Orthopedics;  Laterality: Left;   Current Outpatient Prescriptions on File Prior to Visit  Medication Sig Dispense Refill  . ALPRAZolam (XANAX) 1 MG tablet 1 pill every 6 hours for 2 days, then every 8 hrs for 2 days, then every 12 hours for 2 days then 1 pill a day for 2 days. (Patient taking differently: Take 1 mg by mouth 2 (two) times daily. 1 pill every 6 hours for 2 days, then every 8 hrs for 2 days, then every 12 hours for 2 days then 1 pill a day for 2 days.) 20 tablet 0  .  aspirin EC 325 MG EC tablet Take 1 tablet (325 mg total) by mouth 2 (two) times daily after a meal. 30 tablet 0  . fluocinonide-emollient (LIDEX-E) 0.05 % cream APPLY TO AFFECTED AREA TWICE DAILY. 30 g 1  . hydrOXYzine (ATARAX/VISTARIL) 25 MG tablet Take 1 tablet (25 mg total) by mouth every 6 (six) hours as needed for itching. 20 tablet 0  . Menthol, Topical Analgesic, (BIOFREEZE EX) Apply topically.    Marland Kitchen omeprazole (PRILOSEC) 20 MG capsule Take 1 capsule (20 mg total) by mouth daily. 90 capsule 3  . PROAIR HFA 108 (90 BASE) MCG/ACT inhaler INHALE 2 PUFFS INTO THE LUNGS EVERY 4 TO 6 HOURS AS NEEDED. 8.5 g 3   No current facility-administered  medications on file prior to visit.   No Known Allergies History   Social History  . Marital Status: Single    Spouse Name: N/A  . Number of Children: 0  . Years of Education: N/A   Occupational History  . disabled    Social History Main Topics  . Smoking status: Current Every Day Smoker -- 1.00 packs/day for 30 years    Types: Cigars, Cigarettes  . Smokeless tobacco: Not on file  . Alcohol Use: No     Comment: history of  about a liter of wine per day-before that  . Drug Use: No  . Sexual Activity: Yes    Birth Control/ Protection: None   Other Topics Concern  . Not on file   Social History Narrative   In prison x 2, 14rs, 29 mo   Out for several yrs   Lives alone            Review of Systems  All other systems reviewed and are negative.      Objective:   Physical Exam  Cardiovascular: Normal rate, regular rhythm and normal heart sounds.   Pulmonary/Chest: Effort normal and breath sounds normal.  Skin: Rash noted. There is erythema.  Vitals reviewed.         Assessment & Plan:  Depression - Plan: sertraline (ZOLOFT) 50 MG tablet  Essential hypertension - Plan: amLODipine (NORVASC) 10 MG tablet  Dermatitis - Plan: Ambulatory referral to Dermatology  I will gladly refill his Zoloft and his amlodipine. I am unsure of the cause of his rash. 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 However I recommended a skin biopsy to confirm. The patient would like to see a dermatologist for a second opinion first. I will gladly refer the patient dermatology for second opinion. He is unable to get in to see a dermatologist however I would like to perform a skin biopsy to determine the etiology of the rash that we can better treat it as topical steroid treatments have proven ineffective

## 2014-09-21 ENCOUNTER — Telehealth: Payer: Self-pay | Admitting: *Deleted

## 2014-09-21 NOTE — Telephone Encounter (Signed)
pt has appt scheduled for 11/16/14 at 11:15am with Dr. Nehemiah Massed at [facility]. [facility] on home number to return my call, I called pt cell number and lady picked up stating have wrong number.

## 2014-10-05 NOTE — Telephone Encounter (Signed)
Sent pt letter with appointment information  

## 2014-12-05 ENCOUNTER — Ambulatory Visit (INDEPENDENT_AMBULATORY_CARE_PROVIDER_SITE_OTHER): Payer: Medicaid Other | Admitting: Family Medicine

## 2014-12-05 ENCOUNTER — Encounter: Payer: Self-pay | Admitting: Family Medicine

## 2014-12-05 VITALS — BP 110/80 | HR 86 | Temp 98.5°F | Resp 18 | Wt 152.0 lb

## 2014-12-05 DIAGNOSIS — R1011 Right upper quadrant pain: Secondary | ICD-10-CM

## 2014-12-05 DIAGNOSIS — W108XXA Fall (on) (from) other stairs and steps, initial encounter: Secondary | ICD-10-CM

## 2014-12-05 NOTE — Progress Notes (Signed)
Subjective:    Patient ID: Duane Richardson, male    DOB: Nov 23, 1955, 59 y.o.   MRN: [id number]  HPI Patient admits that he was drinking 3 days ago when he fell off his porch and landed on his right side and his lower right ribs. He is tender to palpation over the ribs as well as his right upper quadrant area near his liver. He admits to drinking alcohol frequently and heavily. He denies any blood in his stool. He denies any black tarry stools. He denies any hematuria. He denies any hemoptysis. He denies any nausea or vomiting. He is tender to palpation over the lower ribs in the axillary line as well as anteriorly. He is also tender to palpation in the right upper quadrant. Past Medical History  Diagnosis Date  . Depression   . Elevated PSA   . Arthritis   . Hyperlipidemia   . Cataract   . Anxiety   . Bipolar 1 disorder ([place])   . ETOH abuse     quit Oct 2013, then relapsed, as of Dec 2013 now has abstained X 1 month  . COPD (chronic obstructive pulmonary disease) ([place])   . Asthma   . Allergy   . Hypertension   . GERD (gastroesophageal reflux disease)    Past Surgical History  Procedure Laterality Date  . Colonoscopy  12/21/09    QIW:LNLG papilla otherwise normal/ pancolonic diverticula/mutiple colonic poylps  . Spine surgery    . Back surgery      lunbar  . Colonoscopy with esophagogastroduodenoscopy (egd) N/A 10/20/2012    Procedure: COLONOSCOPY WITH ESOPHAGOGASTRODUODENOSCOPY (EGD);  Surgeon: Daneil Dolin, MD;  [facility];  Service: Endoscopy;  Laterality: N/A;  10;15  . Orif hip fracture Left 11/24/2012    Procedure: OPEN REDUCTION INTERNAL FIXATION HIP;  Surgeon: Sanjuana Kava, MD;  [facility];  Service: Orthopedics;  Laterality: Left;  . Cystoscopy N/A 01/31/2013    Procedure: CYSTOSCOPY FLEXIBLE;  Surgeon: Marissa Nestle, MD;  [facility];  Service: Urology;  Laterality: N/A;  I would like to do this around 1 pm on monday.   . Transurethral  resection of prostate N/A 03/22/2013    Procedure: TRANSURETHRAL RESECTION OF THE PROSTATE (TURP);  Surgeon: Marissa Nestle, MD;  [facility];  Service: Urology;  Laterality: N/A;  . Total hip arthroplasty Left 06/07/2013    Procedure: REMOVE HARDWARE LEFT HIP AND LEFT TOTAL HIP ARTHROPLASTY ANTERIOR APPROACH;  Surgeon: Mcarthur Rossetti, MD;  Location: [facility];  Service: Orthopedics;  Laterality: Left;  . Hardware removal Left 06/07/2013    Procedure: HARDWARE REMOVAL;  Surgeon: Mcarthur Rossetti, MD;  Location: [facility];  Service: Orthopedics;  Laterality: Left;   Current Outpatient Prescriptions on File Prior to Visit  Medication Sig Dispense Refill  . amLODipine (NORVASC) 10 MG tablet Take 1 tablet (10 mg total) by mouth daily. 90 tablet 3  . aspirin EC 325 MG EC tablet Take 1 tablet (325 mg total) by mouth 2 (two) times daily after a meal. 30 tablet 0  . omeprazole (PRILOSEC) 20 MG capsule Take 1 capsule (20 mg total) by mouth daily. 90 capsule 3  . PROAIR HFA 108 (90 BASE) MCG/ACT inhaler INHALE 2 PUFFS INTO THE LUNGS EVERY 4 TO 6 HOURS AS NEEDED. 8.5 g 3  . sertraline (ZOLOFT) 50 MG tablet Take 1 tablet (50 mg total) by mouth daily. 90 tablet 3  . ALPRAZolam (XANAX) 1 MG tablet 1  pill every 6 hours for 2 days, then every 8 hrs for 2 days, then every 12 hours for 2 days then 1 pill a day for 2 days. (Patient not taking: Reported on 12/05/2014) 20 tablet 0  . fluocinonide-emollient (LIDEX-E) 0.05 % cream APPLY TO AFFECTED AREA TWICE DAILY. (Patient not taking: Reported on 12/05/2014) 30 g 1  . hydrOXYzine (ATARAX/VISTARIL) 25 MG tablet Take 1 tablet (25 mg total) by mouth every 6 (six) hours as needed for itching. (Patient not taking: Reported on 12/05/2014) 20 tablet 0  . Menthol, Topical Analgesic, (BIOFREEZE EX) Apply topically.     No current facility-administered medications on file prior to visit.   No Known Allergies Social History   Social History  . Marital Status:  Single    Spouse Name: N/A  . Number of Children: 0  . Years of Education: N/A   Occupational History  . disabled    Social History Main Topics  . Smoking status: Current Every Day Smoker -- 1.00 packs/day for 30 years    Types: Cigars, Cigarettes  . Smokeless tobacco: Not on file  . Alcohol Use: No     Comment: history of  about a liter of wine per day-before that  . Drug Use: No  . Sexual Activity: Yes    Birth Control/ Protection: None   Other Topics Concern  . Not on file   Social History Narrative   In prison x 2, 14rs, 29 mo   Out for several yrs   Lives alone            Review of Systems  All other systems reviewed and are negative.      Objective:   Physical Exam  Cardiovascular: Normal rate, regular rhythm and normal heart sounds.   Pulmonary/Chest: Effort normal and breath sounds normal. No respiratory distress. He has no wheezes. He has no rales. He exhibits tenderness.  Abdominal: Soft. Bowel sounds are normal. He exhibits no distension. There is tenderness.  Musculoskeletal: Normal range of motion.       Right hip: Normal. He exhibits normal range of motion, normal strength, no tenderness, no bony tenderness and no swelling.  Vitals reviewed.         Assessment & Plan:  Fall (on) (from) other stairs and steps, initial encounter - Plan: DG Abd Acute W/Chest  Right upper quadrant pain - Plan: DG Abd Acute W/Chest, CBC with Differential/Platelet, COMPLETE METABOLIC PANEL WITH GFR  Patient fell and I believe he bruised ribs or possibly cracked several ribs on his right side. I believe his right-sided pain is due to this. I recommended seeking alcohol treatment programs to help treat his alcoholism. I recommended abstinence from alcohol. Also recommended inpatient treatment to avoid delirium tremens. Patient refuses to quit drinking. I will check a CMP to evaluate his liver function test as he is tender to palpation in the right upper quadrant. I would  not be surprised to see hepatitis secondary to alcohol abuse. I will also check a chest x-ray as well as abdominal films to rule out rib fractures, or free air in abdomen. However I believe the majority of the patient's pain are abdominal wall contusions and bruises ribs from his fall

## 2014-12-06 ENCOUNTER — Ambulatory Visit (HOSPITAL_COMMUNITY)
Admission: RE | Admit: 2014-12-06 | Discharge: 2014-12-06 | Disposition: A | Discharge status: Home / Self Care | Payer: Medicaid Other | Source: Ambulatory Visit | Attending: Family Medicine | Admitting: Family Medicine

## 2014-12-06 DIAGNOSIS — M5136 Other intervertebral disc degeneration, lumbar region: Secondary | ICD-10-CM | POA: Diagnosis not present

## 2014-12-06 DIAGNOSIS — W108XXA Fall (on) (from) other stairs and steps, initial encounter: Secondary | ICD-10-CM

## 2014-12-06 DIAGNOSIS — M5134 Other intervertebral disc degeneration, thoracic region: Secondary | ICD-10-CM | POA: Insufficient documentation

## 2014-12-06 DIAGNOSIS — R109 Unspecified abdominal pain: Secondary | ICD-10-CM | POA: Insufficient documentation

## 2014-12-06 DIAGNOSIS — R1011 Right upper quadrant pain: Secondary | ICD-10-CM

## 2014-12-06 LAB — CBC WITH DIFFERENTIAL/PLATELET
Basophils Absolute: 0 10*3/uL (ref 0.0–0.1)
Basophils Relative: 1 % (ref 0–1)
Eosinophils Absolute: 0 10*3/uL (ref 0.0–0.7)
Eosinophils Relative: 0 % (ref 0–5)
HCT: 43.3 % (ref 39.0–52.0)
Hemoglobin: 14.8 g/dL (ref 13.0–17.0)
Lymphocytes Relative: 47 % — ABNORMAL HIGH (ref 12–46)
Lymphs Abs: 1.4 10*3/uL (ref 0.7–4.0)
MCH: 31.1 pg (ref 26.0–34.0)
MCHC: 34.2 g/dL (ref 30.0–36.0)
MCV: 91 fL (ref 78.0–100.0)
MPV: 11.5 fL (ref 8.6–12.4)
Monocytes Absolute: 0.6 10*3/uL (ref 0.1–1.0)
Monocytes Relative: 21 % — ABNORMAL HIGH (ref 3–12)
Neutro Abs: 0.9 10*3/uL — ABNORMAL LOW (ref 1.7–7.7)
Neutrophils Relative %: 31 % — ABNORMAL LOW (ref 43–77)
Platelets: 108 10*3/uL — ABNORMAL LOW (ref 150–400)
RBC: 4.76 MIL/uL (ref 4.22–5.81)
RDW: 12.9 % (ref 11.5–15.5)
WBC: 3 10*3/uL — ABNORMAL LOW (ref 4.0–10.5)

## 2014-12-06 LAB — COMPLETE METABOLIC PANEL WITH GFR
ALT: 24 U/L (ref 9–46)
AST: 43 U/L — ABNORMAL HIGH (ref 10–35)
Albumin: 3.8 g/dL (ref 3.6–5.1)
Alkaline Phosphatase: 110 U/L (ref 40–115)
BUN: 10 mg/dL (ref 7–25)
CO2: 27 mmol/L (ref 20–31)
Calcium: 9 mg/dL (ref 8.6–10.3)
Chloride: 97 mmol/L — ABNORMAL LOW (ref 98–110)
Creat: 0.65 mg/dL — ABNORMAL LOW (ref 0.70–1.25)
GFR, Est African American: >89 mL/min (ref >=60)
GFR, Est Non African American: >89 mL/min (ref >=60)
Glucose, Bld: 119 mg/dL — ABNORMAL HIGH (ref 70–99)
Potassium: 3.5 mmol/L (ref 3.5–5.3)
Sodium: 134 mmol/L — ABNORMAL LOW (ref 135–146)
Total Bilirubin: 0.9 mg/dL (ref 0.2–1.2)
Total Protein: 6.8 g/dL (ref 6.1–8.1)

## 2015-02-07 ENCOUNTER — Other Ambulatory Visit: Payer: Self-pay | Admitting: Family Medicine

## 2015-07-09 ENCOUNTER — Emergency Department (HOSPITAL_COMMUNITY): Payer: Medicaid Other

## 2015-07-09 ENCOUNTER — Encounter (HOSPITAL_COMMUNITY): Payer: Self-pay

## 2015-07-09 ENCOUNTER — Emergency Department (HOSPITAL_COMMUNITY)
Admission: EM | Admit: 2015-07-09 | Discharge: 2015-07-09 | Disposition: A | Discharge status: Home / Self Care | Payer: Medicaid Other | Attending: Emergency Medicine | Admitting: Emergency Medicine

## 2015-07-09 DIAGNOSIS — H109 Unspecified conjunctivitis: Secondary | ICD-10-CM | POA: Diagnosis not present

## 2015-07-09 DIAGNOSIS — I10 Essential (primary) hypertension: Secondary | ICD-10-CM | POA: Diagnosis not present

## 2015-07-09 DIAGNOSIS — H189 Unspecified disorder of cornea: Secondary | ICD-10-CM | POA: Diagnosis not present

## 2015-07-09 DIAGNOSIS — M1990 Unspecified osteoarthritis, unspecified site: Secondary | ICD-10-CM | POA: Diagnosis not present

## 2015-07-09 DIAGNOSIS — E785 Hyperlipidemia, unspecified: Secondary | ICD-10-CM | POA: Insufficient documentation

## 2015-07-09 DIAGNOSIS — F319 Bipolar disorder, unspecified: Secondary | ICD-10-CM | POA: Insufficient documentation

## 2015-07-09 DIAGNOSIS — F17210 Nicotine dependence, cigarettes, uncomplicated: Secondary | ICD-10-CM | POA: Insufficient documentation

## 2015-07-09 DIAGNOSIS — J45909 Unspecified asthma, uncomplicated: Secondary | ICD-10-CM | POA: Insufficient documentation

## 2015-07-09 DIAGNOSIS — R000 Tachycardia, unspecified: Secondary | ICD-10-CM | POA: Insufficient documentation

## 2015-07-09 DIAGNOSIS — Z79899 Other long term (current) drug therapy: Secondary | ICD-10-CM | POA: Insufficient documentation

## 2015-07-09 DIAGNOSIS — H5711 Ocular pain, right eye: Secondary | ICD-10-CM

## 2015-07-09 DIAGNOSIS — J449 Chronic obstructive pulmonary disease, unspecified: Secondary | ICD-10-CM | POA: Diagnosis not present

## 2015-07-09 MED ORDER — AMOXICILLIN-POT CLAVULANATE 875-125 MG PO TABS
1.0000 | ORAL_TABLET | Freq: Once | ORAL | Status: AC
  Administered 2015-07-09: 1 via ORAL
  Filled 2015-07-09: qty 1

## 2015-07-09 MED ORDER — AMOXICILLIN-POT CLAVULANATE 875-125 MG PO TABS: 1.0000 | ORAL_TABLET | Freq: Two times a day (BID) | ORAL | Status: DC

## 2015-07-09 MED ORDER — FLUORESCEIN SODIUM 1 MG OP STRP
ORAL_STRIP | OPHTHALMIC | Status: AC
  Administered 2015-07-09: 1 via OPHTHALMIC
  Filled 2015-07-09: qty 1

## 2015-07-09 MED ORDER — TETRACAINE HCL 0.5 % OP SOLN
1.0000 [drp] | Freq: Once | OPHTHALMIC | Status: AC
  Administered 2015-07-09: 1 [drp] via OPHTHALMIC
  Filled 2015-07-09: qty 4

## 2015-07-09 MED ORDER — TOBRAMYCIN 0.3 % OP SOLN
2.0000 [drp] | OPHTHALMIC | Status: DC
  Administered 2015-07-09: 2 [drp] via OPHTHALMIC
  Filled 2015-07-09: qty 5

## 2015-07-09 MED ORDER — FLUORESCEIN SODIUM 1 MG OP STRP
1.0000 | ORAL_STRIP | Freq: Once | OPHTHALMIC | Status: AC
  Administered 2015-07-09: 1 via OPHTHALMIC

## 2015-07-09 NOTE — ED Notes (Signed)
Instructed pt to take all of antibiotics as prescribed. 

## 2015-07-09 NOTE — ED Notes (Addendum)
Pt reports that his right eye has been irritated with watery drainage since Friday. Denies any foreign body. Reports that he has been rubbing eye. Eyelid swollen. States eye is causing pain in right temporal

## 2015-07-09 NOTE — ED Provider Notes (Signed)
CSN: [id number]     Arrival date & time 07/09/15  1547 History  By signing my name below, I, Duane Richardson, attest that this documentation has been prepared under the direction and in the presence of Debroah Baller, NP. Electronically Signed: Rayna Richardson, ED Scribe. 07/09/2015. 4:58 PM.   Chief Complaint  Patient presents with  . Eye Pain   Patient is a 60 y.o. male presenting with eye pain. The history is provided by the patient. No language interpreter was used.  Eye Pain This is a new problem. The current episode started more than 2 days ago. The problem occurs constantly. The problem has been gradually worsening. Nothing relieves the symptoms. He has tried a warm compress for the symptoms. The treatment provided no relief.   HPI Comments: Duane Richardson is a 60 y.o. male with a PMHx of cataracts who presents to the Emergency Department complaining of worsening, moderate, right eye irritation x 3 days. He reports associated, mild, white eye discharge, mild right lower eyelid swelling and mild pain just lateral to his right eye. His pain worsens with palpation. He has applied warm compresses and visine as well as taken 1x BC powder noting the BC powder provided short term pain relief. He denies a PMHx to his right eye. Pt denies visual disturbances or any other associated symptoms at this time.    Past Medical History  Diagnosis Date  . Depression   . Elevated PSA   . Arthritis   . Hyperlipidemia   . Cataract   . Anxiety   . Bipolar 1 disorder ([place])   . ETOH abuse     quit Oct 2013, then relapsed, as of Dec 2013 now has abstained X 1 month  . COPD (chronic obstructive pulmonary disease) ([place])   . Asthma   . Allergy   . Hypertension   . GERD (gastroesophageal reflux disease)    Past Surgical History  Procedure Laterality Date  . Colonoscopy  12/21/09    [id number]:[id number] papilla otherwise normal/ pancolonic diverticula/mutiple colonic poylps  . Spine surgery    . Back surgery      lunbar   . Colonoscopy with esophagogastroduodenoscopy (egd) N/A 10/20/2012    Procedure: COLONOSCOPY WITH ESOPHAGOGASTRODUODENOSCOPY (EGD);  Surgeon: Daneil Dolin, MD;  [facility];  Service: Endoscopy;  Laterality: N/A;  10;15  . Orif hip fracture Left 11/24/2012    Procedure: OPEN REDUCTION INTERNAL FIXATION HIP;  Surgeon: Sanjuana Kava, MD;  [facility];  Service: Orthopedics;  Laterality: Left;  . Cystoscopy N/A 01/31/2013    Procedure: CYSTOSCOPY FLEXIBLE;  Surgeon: Marissa Nestle, MD;  [facility];  Service: Urology;  Laterality: N/A;  I would like to do this around 1 pm on monday.   . Transurethral resection of prostate N/A 03/22/2013    Procedure: TRANSURETHRAL RESECTION OF THE PROSTATE (TURP);  Surgeon: Marissa Nestle, MD;  [facility];  Service: Urology;  Laterality: N/A;  . Total hip arthroplasty Left 06/07/2013    Procedure: REMOVE HARDWARE LEFT HIP AND LEFT TOTAL HIP ARTHROPLASTY ANTERIOR APPROACH;  Surgeon: Mcarthur Rossetti, MD;  Location: [facility];  Service: Orthopedics;  Laterality: Left;  . Hardware removal Left 06/07/2013    Procedure: HARDWARE REMOVAL;  Surgeon: Mcarthur Rossetti, MD;  Location: [facility];  Service: Orthopedics;  Laterality: Left;   Family History  Problem Relation Age of Onset  . Colon cancer Neg Hx   . Mental illness Sister    Social History  Substance Use Topics  . Smoking status: Current Every Day Smoker -- 0.50 packs/day for 30 years    Types: Cigarettes  . Smokeless tobacco: None  . Alcohol Use: No     Comment: history of  about a liter of wine per day-before that    Review of Systems  HENT: Positive for facial swelling.   Eyes: Positive for pain, discharge, redness and itching. Negative for visual disturbance.  All other systems reviewed and are negative.  Allergies  Review of patient's allergies indicates no known allergies.  Home Medications   Prior to Admission medications   Medication Sig Start Date End  Date Taking? Authorizing Provider  ALPRAZolam Duanne Moron) 1 MG tablet 1 pill every 6 hours for 2 days, then every 8 hrs for 2 days, then every 12 hours for 2 days then 1 pill a day for 2 days. Patient not taking: Reported on 12/05/2014 07/17/14   Susy Frizzle, MD  amLODipine (NORVASC) 10 MG tablet Take 1 tablet (10 mg total) by mouth daily. 09/18/14   Susy Frizzle, MD  amoxicillin-clavulanate (AUGMENTIN) 875-125 MG tablet Take 1 tablet by mouth 2 (two) times daily. 07/09/15   Kwabena Strutz Bunnie Pion, NP  aspirin EC 325 MG EC tablet Take 1 tablet (325 mg total) by mouth 2 (two) times daily after a meal. 06/10/13   Mcarthur Rossetti, MD  fluocinonide-emollient (LIDEX-E) 0.05 % cream APPLY TO AFFECTED AREA TWICE DAILY. Patient not taking: Reported on 12/05/2014 09/07/14   Susy Frizzle, MD  hydrOXYzine (ATARAX/VISTARIL) 25 MG tablet Take 1 tablet (25 mg total) by mouth every 6 (six) hours as needed for itching. Patient not taking: Reported on 12/05/2014 08/18/14   Orpah Greek, MD  Menthol, Topical Analgesic, (BIOFREEZE EX) Apply topically.    Historical Provider, MD  omeprazole (PRILOSEC) 20 MG capsule Take 1 capsule (20 mg total) by mouth daily. 09/01/12   Mary B Dixon, PA-C  PROAIR HFA 108 (90 BASE) MCG/ACT inhaler INHALE 2 PUFFS INTO THE LUNGS EVERY 4 TO 6 HOURS AS NEEDED. 02/08/15   Susy Frizzle, MD  sertraline (ZOLOFT) 50 MG tablet Take 1 tablet (50 mg total) by mouth daily. 09/18/14   Susy Frizzle, MD   BP 140/90 mmHg  Pulse 120  Temp(Src) 99.2 F (37.3 C) (Oral)  Resp 18  SpO2 97%    Physical Exam  Constitutional: He is oriented to person, place, and time. He appears well-developed and well-nourished.  HENT:  Head: Normocephalic and atraumatic.  Eyes: EOM are normal. Pupils are equal, round, and reactive to light. Lids are everted and swept, no foreign bodies found. Right eye exhibits discharge. No foreign body present in the right eye. Right conjunctiva is injected.   Fundoscopic exam:      The right eye shows exudate.  Slit lamp exam:      The right eye shows fluorescein uptake. Right eye corneal ulcer: ?    White region across iris that patient has not noted before today.  Neck: Normal range of motion. Neck supple.  Cardiovascular: Tachycardia present.   Pulmonary/Chest: Effort normal. No respiratory distress.  Abdominal: He exhibits no distension.  Musculoskeletal: Normal range of motion.  Neurological: He is alert and oriented to person, place, and time.  Skin: Skin is warm and dry.  Psychiatric: He has a normal mood and affect. His behavior is normal.  Nursing note and vitals reviewed.   ED Course  Procedures  DIAGNOSTIC STUDIES: Oxygen Saturation is 97% on  RA, normal by my interpretation.   Visual acuity Tetracaine Opth drop to right eye Stained with fluorecin  Slit lamp exam COORDINATION OF CARE: 4:55 PM Discussed next steps with pt. He verbalized understanding and is agreeable with the plan.   7:53 PM Pt evaluated by Dr. Roderic Palau who recommends he be d/c with tobramycin and Augmentin and to follow up with opthalmology tomorrow due to conjunctivitis and a possible corneal ulcer.   Labs Review Labs Reviewed - No data to display  Imaging Review Ct Orbitss W/o Cm  07/09/2015  CLINICAL DATA:  complaining of worsening, moderate, right eye irritation x 3 days. He reports associated, mild discharge, mild right lower eyelid swelling and mild pain just lateral to his right eye. His pain worsens with palpation. He has appli.*comment was truncated* EXAM: CT ORBITS WITHOUT CONTRAST TECHNIQUE: Multidetector CT imaging of the orbits was performed following the standard protocol without intravenous contrast. COMPARISON:  Head CT 07/29/2012 FINDINGS: There is preseptal swelling anterior to the RIGHT orbit/globe. Intraconal contents are clear. The extraocular muscles are normal caliber. No evidence of abnormal osseous findings of the RIGHT orbit. The  LEFT globe and orbit are normal. There is no fluid in the paranasal sinuses. No radiodense foreign body. IMPRESSION: 1. Preseptal thickening anterior to the RIGHT consistent with inflammation or infection. 2. No radiodense foreign body evident. 3. No postseptal inflammation or infection on the RIGHT. Electronically Signed   By: Suzy Bouchard M.D.   On: 07/09/2015 18:28   I personally reviewed and evaluated these images as part of my medical decision-making.   MDM  60 y.o. male with right eye irritation and drainage and abnormal area noted on the iris. Stable for d/c with antibiotic eye drops and PO antibiotics. He will f/u with opthalmology tomorrow. Discussed in detail with the patient need for f/u and he agrees with plan. Instructions on antibiotic eye drops use.    Final diagnoses:  Eye pain, right  Conjunctivitis of right eye  Corneal abnormality   I personally performed the services described in this documentation, which was scribed in my presence. The recorded information has been reviewed and is accurate.   [place], NP 07/09/15 2010  Milton Ferguson, MD 07/10/15 [phone]

## 2015-07-09 NOTE — Discharge Instructions (Signed)
Use the eye drops every 4 hours. Take the antibiotics as directed.   THERE IS AN AREA ON THE COLORED PART OF YOUR EYE THAT SHOULD NOT BE THERE. WE NEED YOU TO SEE THE EYE DOCTOR FOR FURTHER EVALUATION OF YOUR EYE TOMORROW.   CALL DR. HAINES' OFFICE FIRST THING IN THE MORNING FOR FOLLOW UP.

## 2015-08-31 ENCOUNTER — Telehealth: Payer: Self-pay | Admitting: *Deleted

## 2015-08-31 NOTE — Telephone Encounter (Signed)
Received PCS forms for patient from Elder Negus, [facility] caseworker- [phone], Ext. [phone].  Noted patient last OV 12/05/2014. Forms cannot be completed since last OV >90 days.   Call placed to DSS to make aware. LM on VM.

## 2015-09-25 ENCOUNTER — Encounter: Payer: Self-pay | Admitting: Family Medicine

## 2015-10-18 ENCOUNTER — Encounter: Payer: Self-pay | Admitting: Family Medicine

## 2015-10-18 ENCOUNTER — Ambulatory Visit (INDEPENDENT_AMBULATORY_CARE_PROVIDER_SITE_OTHER): Payer: Medicaid Other | Admitting: Family Medicine

## 2015-10-18 VITALS — BP 166/110 | HR 94 | Temp 98.2°F | Resp 18 | Ht 73.0 in | Wt 150.0 lb

## 2015-10-18 DIAGNOSIS — H16011 Central corneal ulcer, right eye: Secondary | ICD-10-CM

## 2015-10-18 DIAGNOSIS — I10 Essential (primary) hypertension: Secondary | ICD-10-CM

## 2015-10-18 DIAGNOSIS — Z0000 Encounter for general adult medical examination without abnormal findings: Secondary | ICD-10-CM

## 2015-10-18 DIAGNOSIS — F32A Depression, unspecified: Secondary | ICD-10-CM

## 2015-10-18 DIAGNOSIS — F329 Major depressive disorder, single episode, unspecified: Secondary | ICD-10-CM | POA: Diagnosis not present

## 2015-10-18 LAB — CBC WITH DIFFERENTIAL/PLATELET
Basophils Absolute: 37 cells/uL (ref 0–200)
Basophils Relative: 1 %
Eosinophils Absolute: 37 cells/uL (ref 15–500)
Eosinophils Relative: 1 %
HCT: 43.2 % (ref 38.5–50.0)
Hemoglobin: 14.8 g/dL (ref 13.0–17.0)
Lymphocytes Relative: 30 %
Lymphs Abs: 1110 cells/uL (ref 850–3900)
MCH: 32.2 pg (ref 27.0–33.0)
MCHC: 34.3 g/dL (ref 32.0–36.0)
MCV: 93.9 fL (ref 80.0–100.0)
MPV: 12.1 fL (ref 7.5–12.5)
Monocytes Absolute: 740 cells/uL (ref 200–950)
Monocytes Relative: 20 %
Neutro Abs: 1776 cells/uL (ref 1500–7800)
Neutrophils Relative %: 48 %
Platelets: 80 10*3/uL — ABNORMAL LOW (ref 140–400)
RBC: 4.6 MIL/uL (ref 4.20–5.80)
RDW: 13.6 % (ref 11.0–15.0)
WBC: 3.7 10*3/uL — ABNORMAL LOW (ref 3.8–10.8)

## 2015-10-18 LAB — COMPLETE METABOLIC PANEL WITH GFR
ALT: 26 U/L (ref 9–46)
AST: 65 U/L — ABNORMAL HIGH (ref 10–35)
Albumin: 3.7 g/dL (ref 3.6–5.1)
Alkaline Phosphatase: 93 U/L (ref 40–115)
BUN: 6 mg/dL — ABNORMAL LOW (ref 7–25)
CO2: 23 mmol/L (ref 20–31)
Calcium: 9.1 mg/dL (ref 8.6–10.3)
Chloride: 103 mmol/L (ref 98–110)
Creat: 0.7 mg/dL (ref 0.70–1.25)
GFR, Est African American: >89 mL/min (ref >=60)
GFR, Est Non African American: >89 mL/min (ref >=60)
Glucose, Bld: 103 mg/dL — ABNORMAL HIGH (ref 70–99)
Potassium: 4.1 mmol/L (ref 3.5–5.3)
Sodium: 136 mmol/L (ref 135–146)
Total Bilirubin: 1 mg/dL (ref 0.2–1.2)
Total Protein: 6.9 g/dL (ref 6.1–8.1)

## 2015-10-18 LAB — LIPID PANEL
Cholesterol: 193 mg/dL (ref 125–200)
HDL: 58 mg/dL (ref >=40)
LDL Cholesterol: 116 mg/dL (ref <130)
Total CHOL/HDL Ratio: 3.3 Ratio (ref <=5.0)
Triglycerides: 97 mg/dL (ref <150)
VLDL: 19 mg/dL (ref <30)

## 2015-10-18 LAB — PSA: PSA: 3 ng/mL (ref <=4.0)

## 2015-10-18 MED ORDER — FLUOCINONIDE-E 0.05 % EX CREA: TOPICAL_CREAM | CUTANEOUS | 1 refills | Status: DC

## 2015-10-18 NOTE — Progress Notes (Signed)
Subjective:    Patient ID: Duane Richardson, male    DOB: 05/04/1955, 60 y.o.   MRN: [id number]  HPI  10/16 Patient admits that he was drinking 3 days ago when he fell off his porch and landed on his right side and his lower right ribs. He is tender to palpation over the ribs as well as his right upper quadrant area near his liver. He admits to drinking alcohol frequently and heavily. He denies any blood in his stool. He denies any black tarry stools. He denies any hematuria. He denies any hemoptysis. He denies any nausea or vomiting. He is tender to palpation over the lower ribs in the axillary line as well as anteriorly. He is also tender to palpation in the right upper quadrant.  At that time, my plan was: Patient fell and I believe he bruised ribs or possibly cracked several ribs on his right side. I believe his right-sided pain is due to this. I recommended seeking alcohol treatment programs to help treat his alcoholism. I recommended abstinence from alcohol. Also recommended inpatient treatment to avoid delirium tremens. Patient refuses to quit drinking. I will check a CMP to evaluate his liver function test as he is tender to palpation in the right upper quadrant. I would not be surprised to see hepatitis secondary to alcohol abuse. I will also check a chest x-ray as well as abdominal films to rule out rib fractures, or free air in abdomen. However I believe the majority of the patient's pain are abdominal wall contusions and bruises ribs from his fall.  10/18/15 Patient has not been seen by me since that time. He is here today for complete physical exam. Apparently went to emergency room in May due to pain and swelling in his right eye. At that time he was diagnosed with a corneal ulcer and was referred to ophthalmology the next day per the emergency room noted. He did not keep that appointment. Today he is complaining of vision loss in his right eye. He states it is constantly blurry. Forcing exam  is performed and the patient does appear to have a corneal ulcer partially covering his pupil. If one looks at the pupil as the center of the clock, and a corneal ulcer is located from 3 to 6:00. He also is extremely tearful. Apparently is off all of his medication including his Zoloft. His blood pressures extremely high today at 166/110. HIV screening is up-to-date. Hep C screens up-to-date. Colonoscopy was performed in 2014. He is due for a PSA. Past Medical History:  Diagnosis Date  . Allergy   . Anxiety   . Arthritis   . Asthma   . Bipolar 1 disorder ([facility])   . Cataract   . COPD (chronic obstructive pulmonary disease) ([place])   . Depression   . Elevated PSA   . ETOH abuse    quit Oct 2013, then relapsed, as of Dec 2013 now has abstained X 1 month  . GERD (gastroesophageal reflux disease)   . Hyperlipidemia   . Hypertension    Past Surgical History:  Procedure Laterality Date  . BACK SURGERY     lunbar  . COLONOSCOPY  12/21/09   [id number] papilla otherwise normal/ pancolonic diverticula/mutiple colonic poylps  . COLONOSCOPY WITH ESOPHAGOGASTRODUODENOSCOPY (EGD) N/A 10/20/2012   Procedure: COLONOSCOPY WITH ESOPHAGOGASTRODUODENOSCOPY (EGD);  Surgeon: Daneil Dolin, MD;  [facility];  Service: Endoscopy;  Laterality: N/A;  10;15  . CYSTOSCOPY N/A 01/31/2013   Procedure: CYSTOSCOPY  FLEXIBLE;  Surgeon: Marissa Nestle, MD;  Location: [facility];  Service: Urology;  Laterality: N/A;  I would like to do this around 1 pm on monday.   Marland Kitchen HARDWARE REMOVAL Left 06/07/2013   Procedure: HARDWARE REMOVAL;  Surgeon: Mcarthur Rossetti, MD;  Location: [facility];  Service: Orthopedics;  Laterality: Left;  . ORIF HIP FRACTURE Left 11/24/2012   Procedure: OPEN REDUCTION INTERNAL FIXATION HIP;  Surgeon: Sanjuana Kava, MD;  Location: [facility];  Service: Orthopedics;  Laterality: Left;  . SPINE SURGERY    . TOTAL HIP ARTHROPLASTY Left 06/07/2013   Procedure: REMOVE HARDWARE LEFT HIP AND LEFT TOTAL HIP  ARTHROPLASTY ANTERIOR APPROACH;  Surgeon: Mcarthur Rossetti, MD;  Location: [facility];  Service: Orthopedics;  Laterality: Left;  . TRANSURETHRAL RESECTION OF PROSTATE N/A 03/22/2013   Procedure: TRANSURETHRAL RESECTION OF THE PROSTATE (TURP);  Surgeon: Marissa Nestle, MD;  Location: [facility];  Service: Urology;  Laterality: N/A;   Current Outpatient Prescriptions on File Prior to Visit  Medication Sig Dispense Refill  . amoxicillin-clavulanate (AUGMENTIN) 875-125 MG tablet Take 1 tablet by mouth 2 (two) times daily. 14 tablet 0  . PROAIR HFA 108 (90 BASE) MCG/ACT inhaler INHALE 2 PUFFS INTO THE LUNGS EVERY 4 TO 6 HOURS AS NEEDED. 8.5 g 2  . ALPRAZolam (XANAX) 1 MG tablet 1 pill every 6 hours for 2 days, then every 8 hrs for 2 days, then every 12 hours for 2 days then 1 pill a day for 2 days. (Patient not taking: Reported on 12/05/2014) 20 tablet 0  . amLODipine (NORVASC) 10 MG tablet Take 1 tablet (10 mg total) by mouth daily. (Patient not taking: Reported on 10/18/2015) 90 tablet 3  . aspirin EC 325 MG EC tablet Take 1 tablet (325 mg total) by mouth 2 (two) times daily after a meal. (Patient not taking: Reported on 10/18/2015) 30 tablet 0  . hydrOXYzine (ATARAX/VISTARIL) 25 MG tablet Take 1 tablet (25 mg total) by mouth every 6 (six) hours as needed for itching. (Patient not taking: Reported on 12/05/2014) 20 tablet 0  . Menthol, Topical Analgesic, (BIOFREEZE EX) Apply topically.    Marland Kitchen omeprazole (PRILOSEC) 20 MG capsule Take 1 capsule (20 mg total) by mouth daily. (Patient not taking: Reported on 10/18/2015) 90 capsule 3  . sertraline (ZOLOFT) 50 MG tablet Take 1 tablet (50 mg total) by mouth daily. (Patient not taking: Reported on 10/18/2015) 90 tablet 3   No current facility-administered medications on file prior to visit.    No Known Allergies Social History   Social History  . Marital status: Single    Spouse name: N/A  . Number of children: 0  . Years of education: N/A   Occupational  History  . disabled    Social History Main Topics  . Smoking status: Current Every Day Smoker    Packs/day: 0.50    Years: 30.00    Types: Cigarettes  . Smokeless tobacco: Not on file  . Alcohol use No     Comment: history of  about a liter of wine per day-before that  . Drug use:     Types: Marijuana  . Sexual activity: Yes    Birth control/ protection: None   Other Topics Concern  . Not on file   Social History Narrative   In prison x 2, 14rs, 29 mo   Out for several yrs   Lives alone         Family History  Problem Relation  Age of Onset  . Colon cancer Neg Hx   . Mental illness Sister       Review of Systems  All other systems reviewed and are negative.      Objective:   Physical Exam  Constitutional: He is oriented to person, place, and time. He appears well-developed and well-nourished. No distress.  HENT:  Head: Normocephalic and atraumatic.  Right Ear: External ear normal.  Left Ear: External ear normal.  Nose: Nose normal.  Mouth/Throat: Oropharynx is clear and moist. No oropharyngeal exudate.  Eyes: EOM are normal. Pupils are equal, round, and reactive to light. Right eye exhibits no discharge, no exudate and no hordeolum. No foreign body present in the right eye. Left eye exhibits no discharge, no exudate and no hordeolum. No foreign body present in the left eye. No scleral icterus.  Slit lamp exam:      The right eye shows corneal ulcer.  Neck: Normal range of motion. Neck supple. No JVD present. No tracheal deviation present. No thyromegaly present.  Cardiovascular: Normal rate, regular rhythm and normal heart sounds.  Exam reveals no gallop and no friction rub.   No murmur heard. Pulmonary/Chest: Effort normal and breath sounds normal. No stridor. No respiratory distress. He has no wheezes. He has no rales. He exhibits no tenderness.  Abdominal: Soft. Bowel sounds are normal. He exhibits no distension and no mass. There is no tenderness. There is no  rebound and no guarding.  Musculoskeletal: Normal range of motion. He exhibits no edema, tenderness or deformity.       Right hip: Normal. He exhibits normal range of motion, normal strength, no tenderness, no bony tenderness and no swelling.  Lymphadenopathy:    He has no cervical adenopathy.  Neurological: He is alert and oriented to person, place, and time. He has normal reflexes. He displays normal reflexes. No cranial nerve deficit. He exhibits normal muscle tone. Coordination normal.  Skin: Skin is warm. No rash noted. He is not diaphoretic. No erythema. No pallor.  Psychiatric: He has a normal mood and affect. His behavior is normal. Judgment and thought content normal.  Vitals reviewed.         Assessment & Plan:  Central corneal ulcer of right eye - Plan: Ambulatory referral to Ophthalmology  Routine general medical examination at a health care facility - Plan: CBC with Differential/Platelet, COMPLETE METABOLIC PANEL WITH GFR, Lipid panel, PSA I will schedule the patient to see an ophthalmologist as soon as possible. I will resume his amlodipine as well as his Zoloft and I will recheck the patient in one week. I will obtain fasting lab work including a CBC, CMP, fasting lipid panel, and a PSA. Immunizations are up-to-date except for the shingles vaccine. Cancer screening is up-to-date I will check a PSA today.

## 2015-10-19 ENCOUNTER — Encounter: Payer: Self-pay | Admitting: Family Medicine

## 2015-10-24 ENCOUNTER — Telehealth: Payer: Self-pay

## 2015-10-24 NOTE — Telephone Encounter (Signed)
-----   Message from [place], LPN sent at 7/85/8850  9:54 AM EDT -----   ----- Message ----- From: Susy Frizzle, MD Sent: 10/19/2015   6:45 AM To: [place] are excellent except ast is elevated likely due to alcohol use.

## 2015-10-24 NOTE — Telephone Encounter (Signed)
Called patient. Gave lab results. Patient verbalized understanding.  

## 2015-10-26 ENCOUNTER — Encounter: Payer: Self-pay | Admitting: Family Medicine

## 2015-10-26 ENCOUNTER — Ambulatory Visit (INDEPENDENT_AMBULATORY_CARE_PROVIDER_SITE_OTHER): Payer: Medicaid Other | Admitting: Family Medicine

## 2015-10-26 DIAGNOSIS — I10 Essential (primary) hypertension: Secondary | ICD-10-CM

## 2015-10-26 DIAGNOSIS — F329 Major depressive disorder, single episode, unspecified: Secondary | ICD-10-CM

## 2015-10-26 DIAGNOSIS — F32A Depression, unspecified: Secondary | ICD-10-CM

## 2015-10-26 MED ORDER — AMLODIPINE BESYLATE 10 MG PO TABS: 10.0000 mg | ORAL_TABLET | Freq: Every day | ORAL | 3 refills | Status: DC

## 2015-10-26 MED ORDER — SERTRALINE HCL 50 MG PO TABS: 50.0000 mg | ORAL_TABLET | Freq: Every day | ORAL | 3 refills | Status: DC

## 2015-10-26 NOTE — Progress Notes (Signed)
Subjective:    Patient ID: Duane Richardson, male    DOB: 18-Feb-1956, 60 y.o.   MRN: [id number]  HPI  10/16 Patient admits that he was drinking 3 days ago when he fell off his porch and landed on his right side and his lower right ribs. He is tender to palpation over the ribs as well as his right upper quadrant area near his liver. He admits to drinking alcohol frequently and heavily. He denies any blood in his stool. He denies any black tarry stools. He denies any hematuria. He denies any hemoptysis. He denies any nausea or vomiting. He is tender to palpation over the lower ribs in the axillary line as well as anteriorly. He is also tender to palpation in the right upper quadrant.  At that time, my plan was: Patient fell and I believe he bruised ribs or possibly cracked several ribs on his right side. I believe his right-sided pain is due to this. I recommended seeking alcohol treatment programs to help treat his alcoholism. I recommended abstinence from alcohol. Also recommended inpatient treatment to avoid delirium tremens. Patient refuses to quit drinking. I will check a CMP to evaluate his liver function test as he is tender to palpation in the right upper quadrant. I would not be surprised to see hepatitis secondary to alcohol abuse. I will also check a chest x-ray as well as abdominal films to rule out rib fractures, or free air in abdomen. However I believe the majority of the patient's pain are abdominal wall contusions and bruises ribs from his fall.  10/18/15 Patient has not been seen by me since that time. He is here today for complete physical exam. Apparently went to emergency room in May due to pain and swelling in his right eye. At that time he was diagnosed with a corneal ulcer and was referred to ophthalmology the next day per the emergency room noted. He did not keep that appointment. Today he is complaining of vision loss in his right eye. He states it is constantly blurry. Forcing exam  is performed and the patient does appear to have a corneal ulcer partially covering his pupil. If one looks at the pupil as the center of the clock, and a corneal ulcer is located from 3 to 6:00. He also is extremely tearful. Apparently is off all of his medication including his Zoloft. His blood pressures extremely high today at 166/110. HIV screening is up-to-date. Hep C screens up-to-date. Colonoscopy was performed in 2014. He is due for a PSA. At that time, my plan was: I will schedule the patient to see an ophthalmologist as soon as possible. I will resume his amlodipine as well as his Zoloft and I will recheck the patient in one week. I will obtain fasting lab work including a CBC, CMP, fasting lipid panel, and a PSA. Immunizations are up-to-date except for the shingles vaccine. Cancer screening is up-to-date I will check a PSA today.  10/26/15 Patient is here today for follow-up. He is still extremely tearful. He never resumed the Zoloft or the amlodipine that I prescribed at his last visit. His blood pressure today is still high. He did see the ophthalmologist and he is scheduled to follow-up with him in October. Past Medical History:  Diagnosis Date  . Allergy   . Anxiety   . Arthritis   . Asthma   . Bipolar 1 disorder ([place])   . Cataract   . COPD (chronic obstructive pulmonary disease) ([place])   .  Depression   . Elevated PSA   . ETOH abuse    quit Oct 2013, then relapsed, as of Dec 2013 now has abstained X 1 month  . GERD (gastroesophageal reflux disease)   . Hyperlipidemia   . Hypertension    Past Surgical History:  Procedure Laterality Date  . BACK SURGERY     lunbar  . COLONOSCOPY  12/21/09   EZM:OQHU papilla otherwise normal/ pancolonic diverticula/mutiple colonic poylps  . COLONOSCOPY WITH ESOPHAGOGASTRODUODENOSCOPY (EGD) N/A 10/20/2012   Procedure: COLONOSCOPY WITH ESOPHAGOGASTRODUODENOSCOPY (EGD);  Surgeon: Daneil Dolin, MD;  [facility];  Service: Endoscopy;   Laterality: N/A;  10;15  . CYSTOSCOPY N/A 01/31/2013   Procedure: CYSTOSCOPY FLEXIBLE;  Surgeon: Marissa Nestle, MD;  [facility];  Service: Urology;  Laterality: N/A;  I would like to do this around 1 pm on monday.   Marland Kitchen HARDWARE REMOVAL Left 06/07/2013   Procedure: HARDWARE REMOVAL;  Surgeon: Mcarthur Rossetti, MD;  Location: [facility];  Service: Orthopedics;  Laterality: Left;  . ORIF HIP FRACTURE Left 11/24/2012   Procedure: OPEN REDUCTION INTERNAL FIXATION HIP;  Surgeon: Sanjuana Kava, MD;  [facility];  Service: Orthopedics;  Laterality: Left;  . SPINE SURGERY    . TOTAL HIP ARTHROPLASTY Left 06/07/2013   Procedure: REMOVE HARDWARE LEFT HIP AND LEFT TOTAL HIP ARTHROPLASTY ANTERIOR APPROACH;  Surgeon: Mcarthur Rossetti, MD;  Location: [facility];  Service: Orthopedics;  Laterality: Left;  . TRANSURETHRAL RESECTION OF PROSTATE N/A 03/22/2013   Procedure: TRANSURETHRAL RESECTION OF THE PROSTATE (TURP);  Surgeon: Marissa Nestle, MD;  [facility];  Service: Urology;  Laterality: N/A;   Current Outpatient Prescriptions on File Prior to Visit  Medication Sig Dispense Refill  . ALPRAZolam (XANAX) 1 MG tablet 1 pill every 6 hours for 2 days, then every 8 hrs for 2 days, then every 12 hours for 2 days then 1 pill a day for 2 days. 20 tablet 0  . aspirin EC 325 MG EC tablet Take 1 tablet (325 mg total) by mouth 2 (two) times daily after a meal. 30 tablet 0  . fluocinonide-emollient (LIDEX-E) 0.05 % cream APPLY TO AFFECTED AREA TWICE DAILY. 30 g 1  . hydrOXYzine (ATARAX/VISTARIL) 25 MG tablet Take 1 tablet (25 mg total) by mouth every 6 (six) hours as needed for itching. 20 tablet 0  . Menthol, Topical Analgesic, (BIOFREEZE EX) Apply topically.    Marland Kitchen omeprazole (PRILOSEC) 20 MG capsule Take 1 capsule (20 mg total) by mouth daily. 90 capsule 3  . PROAIR HFA 108 (90 BASE) MCG/ACT inhaler INHALE 2 PUFFS INTO THE LUNGS EVERY 4 TO 6 HOURS AS NEEDED. 8.5 g 2   No current  facility-administered medications on file prior to visit.    No Known Allergies Social History   Social History  . Marital status: Single    Spouse name: N/A  . Number of children: 0  . Years of education: N/A   Occupational History  . disabled    Social History Main Topics  . Smoking status: Current Every Day Smoker    Packs/day: 0.50    Years: 30.00    Types: Cigarettes  . Smokeless tobacco: Never Used  . Alcohol use No     Comment: history of  about a liter of wine per day-before that  . Drug use:     Types: Marijuana  . Sexual activity: Yes    Birth control/ protection: None   Other Topics Concern  .  Not on file   Social History Narrative   In prison x 2, 14rs, 29 mo   Out for several yrs   Lives alone         Family History  Problem Relation Age of Onset  . Colon cancer Neg Hx   . Mental illness Sister       Review of Systems  All other systems reviewed and are negative.      Objective:   Physical Exam  Constitutional: He is oriented to person, place, and time. He appears well-developed and well-nourished. No distress.  HENT:  Head: Normocephalic and atraumatic.  Right Ear: External ear normal.  Left Ear: External ear normal.  Nose: Nose normal.  Mouth/Throat: Oropharynx is clear and moist. No oropharyngeal exudate.  Eyes: EOM are normal. Pupils are equal, round, and reactive to light. Right eye exhibits no discharge, no exudate and no hordeolum. No foreign body present in the right eye. Left eye exhibits no discharge, no exudate and no hordeolum. No foreign body present in the left eye. No scleral icterus.  Slit lamp exam:      The right eye shows corneal ulcer.  Neck: Normal range of motion. Neck supple. No JVD present. No tracheal deviation present. No thyromegaly present.  Cardiovascular: Normal rate, regular rhythm and normal heart sounds.  Exam reveals no gallop and no friction rub.   No murmur heard. Pulmonary/Chest: Effort normal and  breath sounds normal. No stridor. No respiratory distress. He has no wheezes. He has no rales. He exhibits no tenderness.  Abdominal: Soft. Bowel sounds are normal. He exhibits no distension and no mass. There is no tenderness. There is no rebound and no guarding.  Musculoskeletal: Normal range of motion. He exhibits no edema, tenderness or deformity.       Right hip: Normal. He exhibits normal range of motion, normal strength, no tenderness, no bony tenderness and no swelling.  Lymphadenopathy:    He has no cervical adenopathy.  Neurological: He is alert and oriented to person, place, and time. He has normal reflexes. No cranial nerve deficit. He exhibits normal muscle tone. Coordination normal.  Skin: Skin is warm. No rash noted. He is not diaphoretic. No erythema. No pallor.  Psychiatric: He has a normal mood and affect. His behavior is normal. Judgment and thought content normal.  Vitals reviewed.         Assessment & Plan:  Essential hypertension - Plan: amLODipine (NORVASC) 10 MG tablet  Depression - Plan: sertraline (ZOLOFT) 50 MG tablet Patient's blood pressure still elevated. Resume amlodipine 10 mg a day and recheck blood pressure in one month. I believe the patient is suffering from depression. I recommended that he resume Zoloft 50 mg a day and recheck with me in one month to monitor for improvement

## 2015-11-27 ENCOUNTER — Encounter: Payer: Self-pay | Admitting: Family Medicine

## 2015-11-27 ENCOUNTER — Ambulatory Visit (INDEPENDENT_AMBULATORY_CARE_PROVIDER_SITE_OTHER): Payer: Medicaid Other | Admitting: Family Medicine

## 2015-11-27 VITALS — BP 130/100 | HR 76 | Temp 97.6°F | Resp 18 | Ht 73.0 in | Wt 152.0 lb

## 2015-11-27 DIAGNOSIS — I10 Essential (primary) hypertension: Secondary | ICD-10-CM | POA: Diagnosis not present

## 2015-11-27 MED ORDER — HYDROCHLOROTHIAZIDE 25 MG PO TABS: 25.0000 mg | ORAL_TABLET | Freq: Every day | ORAL | 3 refills | Status: DC

## 2015-11-27 NOTE — Progress Notes (Signed)
Subjective:    Patient ID: Duane Richardson, male    DOB: 11-21-1955, 60 y.o.   MRN: [id number]  HPI   10/16 Patient admits that he was drinking 3 days ago when he fell off his porch and landed on his right side and his lower right ribs. He is tender to palpation over the ribs as well as his right upper quadrant area near his liver. He admits to drinking alcohol frequently and heavily. He denies any blood in his stool. He denies any black tarry stools. He denies any hematuria. He denies any hemoptysis. He denies any nausea or vomiting. He is tender to palpation over the lower ribs in the axillary line as well as anteriorly. He is also tender to palpation in the right upper quadrant.  At that time, my plan was: Patient fell and I believe he bruised ribs or possibly cracked several ribs on his right side. I believe his right-sided pain is due to this. I recommended seeking alcohol treatment programs to help treat his alcoholism. I recommended abstinence from alcohol. Also recommended inpatient treatment to avoid delirium tremens. Patient refuses to quit drinking. I will check a CMP to evaluate his liver function test as he is tender to palpation in the right upper quadrant. I would not be surprised to see hepatitis secondary to alcohol abuse. I will also check a chest x-ray as well as abdominal films to rule out rib fractures, or free air in abdomen. However I believe the majority of the patient's pain are abdominal wall contusions and bruises ribs from his fall.  10/18/15 Patient has not been seen by me since that time. He is here today for complete physical exam. Apparently went to emergency room in May due to pain and swelling in his right eye. At that time he was diagnosed with a corneal ulcer and was referred to ophthalmology the next day per the emergency room noted. He did not keep that appointment. Today he is complaining of vision loss in his right eye. He states it is constantly blurry. Forcing exam  is performed and the patient does appear to have a corneal ulcer partially covering his pupil. If one looks at the pupil as the center of the clock, and a corneal ulcer is located from 3 to 6:00. He also is extremely tearful. Apparently is off all of his medication including his Zoloft. His blood pressures extremely high today at 166/110. HIV screening is up-to-date. Hep C screens up-to-date. Colonoscopy was performed in 2014. He is due for a PSA. At that time, my plan was: I will schedule the patient to see an ophthalmologist as soon as possible. I will resume his amlodipine as well as his Zoloft and I will recheck the patient in one week. I will obtain fasting lab work including a CBC, CMP, fasting lipid panel, and a PSA. Immunizations are up-to-date except for the shingles vaccine. Cancer screening is up-to-date I will check a PSA today.  10/26/15 Patient is here today for follow-up. He is still extremely tearful. He never resumed the Zoloft or the amlodipine that I prescribed at his last visit. His blood pressure today is still high. He did see the ophthalmologist and he is scheduled to follow-up with him in October.  At that time, my plan was:  Amlodipine 10 mg poqday and zoloft 50 mg poqday.  Recheck in 1 month.  11/27/15 He is here today for follow-up. His blood pressure is better at 130/100. I verified this myself.  However his diastolic blood pressure is still very high. He is asleep doing better on Zoloft. He is no longer crying. He seems less tearful. He seems happier and is smiling during our encounter. He denies any depression. He denies any anhedonia or suicidal ideation. Unfortunately he continues to smoke. He also eats excessive salt  Past Medical History:  Diagnosis Date  . Allergy   . Anxiety   . Arthritis   . Asthma   . Bipolar 1 disorder ([place])   . Cataract   . COPD (chronic obstructive pulmonary disease) ([place])   . Depression   . Elevated PSA   . ETOH abuse    quit Oct 2013, then  relapsed, as of Dec 2013 now has abstained X 1 month  . GERD (gastroesophageal reflux disease)   . Hyperlipidemia   . Hypertension    Past Surgical History:  Procedure Laterality Date  . BACK SURGERY     lunbar  . COLONOSCOPY  12/21/09   TFT:DDUK papilla otherwise normal/ pancolonic diverticula/mutiple colonic poylps  . COLONOSCOPY WITH ESOPHAGOGASTRODUODENOSCOPY (EGD) N/A 10/20/2012   Procedure: COLONOSCOPY WITH ESOPHAGOGASTRODUODENOSCOPY (EGD);  Surgeon: Daneil Dolin, MD;  [facility];  Service: Endoscopy;  Laterality: N/A;  10;15  . CYSTOSCOPY N/A 01/31/2013   Procedure: CYSTOSCOPY FLEXIBLE;  Surgeon: Marissa Nestle, MD;  Location: [facility];  Service: Urology;  Laterality: N/A;  I would like to do this around 1 pm on monday.   Marland Kitchen HARDWARE REMOVAL Left 06/07/2013   Procedure: HARDWARE REMOVAL;  Surgeon: Mcarthur Rossetti, MD;  Location: [facility];  Service: Orthopedics;  Laterality: Left;  . ORIF HIP FRACTURE Left 11/24/2012   Procedure: OPEN REDUCTION INTERNAL FIXATION HIP;  Surgeon: Sanjuana Kava, MD;  Location: [facility];  Service: Orthopedics;  Laterality: Left;  . SPINE SURGERY    . TOTAL HIP ARTHROPLASTY Left 06/07/2013   Procedure: REMOVE HARDWARE LEFT HIP AND LEFT TOTAL HIP ARTHROPLASTY ANTERIOR APPROACH;  Surgeon: Mcarthur Rossetti, MD;  Location: [facility];  Service: Orthopedics;  Laterality: Left;  . TRANSURETHRAL RESECTION OF PROSTATE N/A 03/22/2013   Procedure: TRANSURETHRAL RESECTION OF THE PROSTATE (TURP);  Surgeon: Marissa Nestle, MD;  Location: [facility];  Service: Urology;  Laterality: N/A;   Current Outpatient Prescriptions on File Prior to Visit  Medication Sig Dispense Refill  . ALPRAZolam (XANAX) 1 MG tablet 1 pill every 6 hours for 2 days, then every 8 hrs for 2 days, then every 12 hours for 2 days then 1 pill a day for 2 days. 20 tablet 0  . amLODipine (NORVASC) 10 MG tablet Take 1 tablet (10 mg total) by mouth daily. 90 tablet 3  . aspirin EC 325 MG EC  tablet Take 1 tablet (325 mg total) by mouth 2 (two) times daily after a meal. 30 tablet 0  . fluocinonide-emollient (LIDEX-E) 0.05 % cream APPLY TO AFFECTED AREA TWICE DAILY. 30 g 1  . hydrOXYzine (ATARAX/VISTARIL) 25 MG tablet Take 1 tablet (25 mg total) by mouth every 6 (six) hours as needed for itching. 20 tablet 0  . Menthol, Topical Analgesic, (BIOFREEZE EX) Apply topically.    Marland Kitchen omeprazole (PRILOSEC) 20 MG capsule Take 1 capsule (20 mg total) by mouth daily. 90 capsule 3  . PROAIR HFA 108 (90 BASE) MCG/ACT inhaler INHALE 2 PUFFS INTO THE LUNGS EVERY 4 TO 6 HOURS AS NEEDED. 8.5 g 2  . sertraline (ZOLOFT) 50 MG tablet Take 1 tablet (50 mg total) by mouth daily. 90 tablet  3   No current facility-administered medications on file prior to visit.    No Known Allergies Social History   Social History  . Marital status: Single    Spouse name: N/A  . Number of children: 0  . Years of education: N/A   Occupational History  . disabled    Social History Main Topics  . Smoking status: Current Every Day Smoker    Packs/day: 0.50    Years: 30.00    Types: Cigarettes  . Smokeless tobacco: Never Used  . Alcohol use No     Comment: history of  about a liter of wine per day-before that  . Drug use:     Types: Marijuana  . Sexual activity: Yes    Birth control/ protection: None   Other Topics Concern  . Not on file   Social History Narrative   In prison x 2, 14rs, 29 mo   Out for several yrs   Lives alone         Family History  Problem Relation Age of Onset  . Colon cancer Neg Hx   . Mental illness Sister       Review of Systems  All other systems reviewed and are negative.      Objective:   Physical Exam  Constitutional: He is oriented to person, place, and time. He appears well-developed and well-nourished. No distress.  HENT:  Head: Normocephalic and atraumatic.  Right Ear: External ear normal.  Left Ear: External ear normal.  Nose: Nose normal.  Mouth/Throat:  Oropharynx is clear and moist. No oropharyngeal exudate.  Eyes: EOM are normal. Pupils are equal, round, and reactive to light.  Neck: Normal range of motion. Neck supple. No JVD present. No tracheal deviation present. No thyromegaly present.  Cardiovascular: Normal rate, regular rhythm and normal heart sounds.  Exam reveals no gallop and no friction rub.   No murmur heard. Pulmonary/Chest: Effort normal and breath sounds normal. No stridor. No respiratory distress. He has no wheezes. He has no rales. He exhibits no tenderness.  Abdominal: Soft. Bowel sounds are normal. He exhibits no distension and no mass. There is no tenderness. There is no rebound and no guarding.  Musculoskeletal: Normal range of motion. He exhibits no edema, tenderness or deformity.       Right hip: Normal. He exhibits normal range of motion, normal strength, no tenderness, no bony tenderness and no swelling.  Lymphadenopathy:    He has no cervical adenopathy.  Neurological: He is alert and oriented to person, place, and time. He has normal reflexes. No cranial nerve deficit. He exhibits normal muscle tone. Coordination normal.  Skin: Skin is warm. No rash noted. He is not diaphoretic. No erythema. No pallor.  Psychiatric: He has a normal mood and affect. His behavior is normal. Judgment and thought content normal.  Vitals reviewed.         Assessment & Plan:  Benign essential HTN - Plan: hydrochlorothiazide (HYDRODIURIL) 25 MG tablet  Continue amlodipine 10 mg a day and supplement with hydrochlorothiazide 25 mg a day. Continue Zoloft 50 mg by mouth daily as this seems to be helping with his depression. I encouraged less salt in his diet. Also encouraged smoking cessation. Recheck in 6 months. He refuses flu shot

## 2015-12-04 ENCOUNTER — Inpatient Hospital Stay (HOSPITAL_COMMUNITY): Payer: Medicaid Other

## 2015-12-04 ENCOUNTER — Encounter (HOSPITAL_COMMUNITY): Payer: Self-pay | Admitting: Emergency Medicine

## 2015-12-04 ENCOUNTER — Emergency Department (HOSPITAL_COMMUNITY): Payer: Medicaid Other

## 2015-12-04 ENCOUNTER — Inpatient Hospital Stay (HOSPITAL_COMMUNITY)
Admission: EM | Admit: 2015-12-04 | Discharge: 2015-12-06 | DRG: 641 | Discharge status: Against Medical Advice | Payer: Medicaid Other | Attending: Internal Medicine | Admitting: Internal Medicine

## 2015-12-04 DIAGNOSIS — E876 Hypokalemia: Secondary | ICD-10-CM | POA: Diagnosis present

## 2015-12-04 DIAGNOSIS — E860 Dehydration: Secondary | ICD-10-CM | POA: Diagnosis not present

## 2015-12-04 DIAGNOSIS — Z96642 Presence of left artificial hip joint: Secondary | ICD-10-CM | POA: Diagnosis present

## 2015-12-04 DIAGNOSIS — Z7982 Long term (current) use of aspirin: Secondary | ICD-10-CM

## 2015-12-04 DIAGNOSIS — R55 Syncope and collapse: Secondary | ICD-10-CM | POA: Diagnosis present

## 2015-12-04 DIAGNOSIS — R531 Weakness: Secondary | ICD-10-CM | POA: Diagnosis not present

## 2015-12-04 DIAGNOSIS — R400 Somnolence: Secondary | ICD-10-CM | POA: Diagnosis present

## 2015-12-04 DIAGNOSIS — Z79899 Other long term (current) drug therapy: Secondary | ICD-10-CM

## 2015-12-04 DIAGNOSIS — D72819 Decreased white blood cell count, unspecified: Secondary | ICD-10-CM | POA: Diagnosis present

## 2015-12-04 DIAGNOSIS — I10 Essential (primary) hypertension: Secondary | ICD-10-CM | POA: Diagnosis present

## 2015-12-04 DIAGNOSIS — F1010 Alcohol abuse, uncomplicated: Secondary | ICD-10-CM | POA: Diagnosis not present

## 2015-12-04 DIAGNOSIS — J449 Chronic obstructive pulmonary disease, unspecified: Secondary | ICD-10-CM | POA: Diagnosis present

## 2015-12-04 DIAGNOSIS — F329 Major depressive disorder, single episode, unspecified: Secondary | ICD-10-CM | POA: Diagnosis present

## 2015-12-04 DIAGNOSIS — D696 Thrombocytopenia, unspecified: Secondary | ICD-10-CM | POA: Diagnosis present

## 2015-12-04 DIAGNOSIS — F10239 Alcohol dependence with withdrawal, unspecified: Secondary | ICD-10-CM | POA: Diagnosis present

## 2015-12-04 DIAGNOSIS — M1990 Unspecified osteoarthritis, unspecified site: Secondary | ICD-10-CM | POA: Diagnosis present

## 2015-12-04 DIAGNOSIS — F10230 Alcohol dependence with withdrawal, uncomplicated: Secondary | ICD-10-CM | POA: Diagnosis not present

## 2015-12-04 DIAGNOSIS — K219 Gastro-esophageal reflux disease without esophagitis: Secondary | ICD-10-CM | POA: Diagnosis present

## 2015-12-04 DIAGNOSIS — K529 Noninfective gastroenteritis and colitis, unspecified: Secondary | ICD-10-CM

## 2015-12-04 DIAGNOSIS — R109 Unspecified abdominal pain: Secondary | ICD-10-CM | POA: Insufficient documentation

## 2015-12-04 DIAGNOSIS — R112 Nausea with vomiting, unspecified: Secondary | ICD-10-CM | POA: Diagnosis not present

## 2015-12-04 DIAGNOSIS — Z818 Family history of other mental and behavioral disorders: Secondary | ICD-10-CM

## 2015-12-04 DIAGNOSIS — Z9889 Other specified postprocedural states: Secondary | ICD-10-CM | POA: Diagnosis not present

## 2015-12-04 DIAGNOSIS — F10939 Alcohol use, unspecified with withdrawal, unspecified: Secondary | ICD-10-CM

## 2015-12-04 LAB — URINALYSIS, ROUTINE W REFLEX MICROSCOPIC
Bilirubin Urine: NEGATIVE
Glucose, UA: NEGATIVE mg/dL
Hgb urine dipstick: NEGATIVE
Ketones, ur: 15 mg/dL — AB
Leukocytes, UA: NEGATIVE
Nitrite: NEGATIVE
Protein, ur: NEGATIVE mg/dL
Specific Gravity, Urine: 1.015 (ref 1.005–1.030)
pH: 7 (ref 5.0–8.0)

## 2015-12-04 LAB — COMPREHENSIVE METABOLIC PANEL
ALT: 37 U/L (ref 17–63)
AST: 166 U/L — ABNORMAL HIGH (ref 15–41)
Albumin: 3.3 g/dL — ABNORMAL LOW (ref 3.5–5.0)
Alkaline Phosphatase: 97 U/L (ref 38–126)
Anion gap: 11 (ref 5–15)
BUN: 5 mg/dL — ABNORMAL LOW (ref 6–20)
CO2: 21 mmol/L — ABNORMAL LOW (ref 22–32)
Calcium: 8.4 mg/dL — ABNORMAL LOW (ref 8.9–10.3)
Chloride: 107 mmol/L (ref 101–111)
Creatinine, Ser: 0.83 mg/dL (ref 0.61–1.24)
GFR calc Af Amer: >60 mL/min (ref >60)
GFR calc non Af Amer: >60 mL/min (ref >60)
Glucose, Bld: 110 mg/dL — ABNORMAL HIGH (ref 65–99)
Potassium: 4.2 mmol/L (ref 3.5–5.1)
Sodium: 139 mmol/L (ref 135–145)
Total Bilirubin: 0.8 mg/dL (ref 0.3–1.2)
Total Protein: 6.6 g/dL (ref 6.5–8.1)

## 2015-12-04 LAB — CBC
HCT: 43.1 % (ref 39.0–52.0)
Hemoglobin: 14.7 g/dL (ref 13.0–17.0)
MCH: 32.3 pg (ref 26.0–34.0)
MCHC: 34.1 g/dL (ref 30.0–36.0)
MCV: 94.7 fL (ref 78.0–100.0)
Platelets: 94 10*3/uL — ABNORMAL LOW (ref 150–400)
RBC: 4.55 MIL/uL (ref 4.22–5.81)
RDW: 13.8 % (ref 11.5–15.5)
WBC: 1.9 10*3/uL — ABNORMAL LOW (ref 4.0–10.5)

## 2015-12-04 LAB — I-STAT CG4 LACTIC ACID, ED: Lactic Acid, Venous: 1.04 mmol/L (ref 0.5–1.9)

## 2015-12-04 LAB — LIPASE, BLOOD: Lipase: 24 U/L (ref 11–51)

## 2015-12-04 MED ORDER — HYDRALAZINE HCL 20 MG/ML IJ SOLN: 10.0000 mg | INTRAMUSCULAR | Status: DC | PRN

## 2015-12-04 MED ORDER — SODIUM CHLORIDE 0.9 % IV BOLUS (SEPSIS)
1000.0000 mL | Freq: Once | INTRAVENOUS | Status: AC
  Administered 2015-12-04: 1000 mL via INTRAVENOUS

## 2015-12-04 MED ORDER — SERTRALINE HCL 50 MG PO TABS
50.0000 mg | ORAL_TABLET | Freq: Every day | ORAL | Status: DC
  Administered 2015-12-05 – 2015-12-06 (×2): 50 mg via ORAL
  Filled 2015-12-04 (×2): qty 1

## 2015-12-04 MED ORDER — LORAZEPAM 2 MG/ML IJ SOLN
0.0000 mg | Freq: Four times a day (QID) | INTRAMUSCULAR | Status: DC
Start: 2015-12-05 — End: 2015-12-06
  Administered 2015-12-05: 2 mg via INTRAVENOUS
  Administered 2015-12-06: 1 mg via INTRAVENOUS
  Filled 2015-12-04: qty 1

## 2015-12-04 MED ORDER — CHLORDIAZEPOXIDE HCL 25 MG PO CAPS
100.0000 mg | ORAL_CAPSULE | Freq: Once | ORAL | Status: AC
  Administered 2015-12-04: 100 mg via ORAL
  Filled 2015-12-04: qty 4

## 2015-12-04 MED ORDER — ONDANSETRON HCL 4 MG PO TABS: 4.0000 mg | ORAL_TABLET | Freq: Four times a day (QID) | ORAL | Status: DC | PRN

## 2015-12-04 MED ORDER — LORAZEPAM 2 MG/ML IJ SOLN: 0.0000 mg | Freq: Two times a day (BID) | INTRAMUSCULAR | Status: DC

## 2015-12-04 MED ORDER — SODIUM CHLORIDE 0.9 % IV SOLN
INTRAVENOUS | Status: AC
  Administered 2015-12-05: 01:00:00 via INTRAVENOUS

## 2015-12-04 MED ORDER — FOLIC ACID 1 MG PO TABS
1.0000 mg | ORAL_TABLET | Freq: Every day | ORAL | Status: DC
  Administered 2015-12-05: 1 mg via ORAL
  Filled 2015-12-04 (×2): qty 1

## 2015-12-04 MED ORDER — THIAMINE HCL 100 MG/ML IJ SOLN: 100.0000 mg | Freq: Every day | INTRAMUSCULAR | Status: DC

## 2015-12-04 MED ORDER — VITAMIN B-1 100 MG PO TABS
100.0000 mg | ORAL_TABLET | Freq: Every day | ORAL | Status: DC
  Administered 2015-12-05 – 2015-12-06 (×2): 100 mg via ORAL
  Filled 2015-12-04 (×2): qty 1

## 2015-12-04 MED ORDER — ADULT MULTIVITAMIN W/MINERALS CH
1.0000 | ORAL_TABLET | Freq: Every day | ORAL | Status: DC
  Administered 2015-12-05 – 2015-12-06 (×2): 1 via ORAL
  Filled 2015-12-04 (×2): qty 1

## 2015-12-04 MED ORDER — ACETAMINOPHEN 650 MG RE SUPP
650.0000 mg | Freq: Four times a day (QID) | RECTAL | Status: DC | PRN
Start: 2015-12-04 — End: 2015-12-06

## 2015-12-04 MED ORDER — ALBUTEROL SULFATE (2.5 MG/3ML) 0.083% IN NEBU: 3.0000 mL | INHALATION_SOLUTION | RESPIRATORY_TRACT | Status: DC | PRN

## 2015-12-04 MED ORDER — FOLIC ACID 1 MG PO TABS
1.0000 mg | ORAL_TABLET | Freq: Every day | ORAL | Status: DC
Start: 2015-12-04 — End: 2015-12-04
  Administered 2015-12-04: 1 mg via ORAL
  Filled 2015-12-04: qty 1

## 2015-12-04 MED ORDER — ONDANSETRON HCL 4 MG/2ML IJ SOLN: 4.0000 mg | Freq: Four times a day (QID) | INTRAMUSCULAR | Status: DC | PRN

## 2015-12-04 MED ORDER — IOPAMIDOL (ISOVUE-300) INJECTION 61%
INTRAVENOUS | Status: AC
  Administered 2015-12-04: 100 mL
  Filled 2015-12-04: qty 100

## 2015-12-04 MED ORDER — AMLODIPINE BESYLATE 10 MG PO TABS
10.0000 mg | ORAL_TABLET | Freq: Every day | ORAL | Status: DC
  Administered 2015-12-05 – 2015-12-06 (×2): 10 mg via ORAL
  Filled 2015-12-04 (×2): qty 1

## 2015-12-04 MED ORDER — THIAMINE HCL 100 MG/ML IJ SOLN: Freq: Once | INTRAVENOUS | Status: DC

## 2015-12-04 MED ORDER — LORAZEPAM 2 MG/ML IJ SOLN
2.0000 mg | Freq: Once | INTRAMUSCULAR | Status: AC
  Administered 2015-12-04: 2 mg via INTRAVENOUS
  Filled 2015-12-04: qty 1

## 2015-12-04 MED ORDER — LORAZEPAM 2 MG/ML IJ SOLN
1.0000 mg | Freq: Four times a day (QID) | INTRAMUSCULAR | Status: DC | PRN
  Filled 2015-12-04 (×2): qty 1

## 2015-12-04 MED ORDER — METRONIDAZOLE IN NACL 5-0.79 MG/ML-% IV SOLN: 500.0000 mg | Freq: Three times a day (TID) | INTRAVENOUS | Status: DC

## 2015-12-04 MED ORDER — ACETAMINOPHEN 325 MG PO TABS: 650.0000 mg | ORAL_TABLET | Freq: Four times a day (QID) | ORAL | Status: DC | PRN

## 2015-12-04 MED ORDER — LORAZEPAM 1 MG PO TABS: 1.0000 mg | ORAL_TABLET | Freq: Four times a day (QID) | ORAL | Status: DC | PRN

## 2015-12-04 NOTE — ED Notes (Signed)
Patient transported to CT scan . 

## 2015-12-04 NOTE — ED Triage Notes (Signed)
Pt sts generalized weakness and some N/V; pt sts some diaphoresis today; pt had some eye sx this am

## 2015-12-04 NOTE — ED Notes (Signed)
Pt unable to void at this time. 

## 2015-12-04 NOTE — ED Notes (Signed)
Pt reports generalized abd pain, severe tremors and syncopal episode while he was at his eye doctor's.  Pt is A&Ox 4.  Denies feeling dizzy prior to the Fall.  Pt reports he stopped drinking x 3 days ago.  Normally drinks a fifth of liquor daily.

## 2015-12-04 NOTE — ED Provider Notes (Signed)
[facility] DEPT Provider Note   CSN: [id number] Arrival date & time: 12/04/15  1157     History   Chief Complaint Chief Complaint  Patient presents with  . Weakness    HPI Duane Richardson is a 60 y.o. male.  The history is provided by the patient.  Illness  This is a new (tremors, generalized weakness, near syncope) problem. The current episode started yesterday. The problem occurs constantly. The problem has been gradually worsening. Associated symptoms include abdominal pain. Pertinent negatives include no chest pain, no headaches and no shortness of breath. Nothing aggravates the symptoms. Nothing relieves the symptoms. He has tried nothing for the symptoms.    Past Medical History:  Diagnosis Date  . Allergy   . Anxiety   . Arthritis   . Asthma   . Bipolar 1 disorder ([place])   . Cataract   . COPD (chronic obstructive pulmonary disease) ([place])   . Depression   . Elevated PSA   . ETOH abuse    quit Oct 2013, then relapsed, as of Dec 2013 now has abstained X 1 month  . GERD (gastroesophageal reflux disease)   . Hyperlipidemia   . Hypertension     Patient Active Problem List   Diagnosis Date Noted  . Alcohol withdrawal ([place]) 12/04/2015  . Arthritis of left hip 06/07/2013  . Status post THR (total hip replacement) 06/07/2013  . BPH (benign prostatic hypertrophy) with urinary obstruction 03/22/2013  . SIRS (systemic inflammatory response syndrome) ([place]) 01/28/2013  . UTI (lower urinary tract infection) 01/28/2013  . Acute urinary retention 01/28/2013  . Protein-calorie malnutrition, severe ([place]) 11/30/2012  . Hip fracture ([place]) 11/23/2012  . HTN (hypertension) 11/23/2012  . Steatosis of liver 11/15/2012  . Alcohol intoxication ([place]) 11/15/2012  . Anorexia nervosa 10/13/2012  . Thrombocytopenia, unspecified 10/13/2012  . Loss of weight 07/16/2012  . Loose stools 07/16/2012  . ETOH abuse   . Weight loss 02/17/2012  . Dysphagia 12/30/2011  . Oral candida  12/30/2011  . Rectal bleed 12/30/2011  . Unintentional weight loss 12/30/2011  . Hx of adenomatous colonic polyps 12/30/2011  . LEUKOPENIA, MILD 07/08/2007  . CHEST PAIN 06/24/2007  . COPD, MILD 12/21/2006  . LIVER FUNCTION TESTS, ABNORMAL 11/09/2006  . SYMPTOM, COUGH 09/28/2006  . HYPERLIPIDEMIA 05/27/2006  . DISORDER, BIPOLAR NOS 05/27/2006  . TOBACCO ABUSE 05/27/2006  . DEPRESSION 05/27/2006  . COMMON MIGRAINE 05/27/2006  . CATARACT NOS 05/27/2006  . ALLERGIC RHINITIS 05/27/2006  . ASTHMA 05/27/2006  . GERD 05/27/2006  . ARTHRITIS 05/27/2006  . LOW BACK PAIN, CHRONIC 05/27/2006  . MALAISE AND FATIGUE 05/27/2006    Past Surgical History:  Procedure Laterality Date  . BACK SURGERY     lunbar  . COLONOSCOPY  12/21/09   PPI:RJJO papilla otherwise normal/ pancolonic diverticula/mutiple colonic poylps  . COLONOSCOPY WITH ESOPHAGOGASTRODUODENOSCOPY (EGD) N/A 10/20/2012   Procedure: COLONOSCOPY WITH ESOPHAGOGASTRODUODENOSCOPY (EGD);  Surgeon: Daneil Dolin, MD;  [facility];  Service: Endoscopy;  Laterality: N/A;  10;15  . CYSTOSCOPY N/A 01/31/2013   Procedure: CYSTOSCOPY FLEXIBLE;  Surgeon: Marissa Nestle, MD;  Location[facility];  Service: Urology;  Laterality: N/A;  I would like to do this around 1 pm on monday.   Marland Kitchen HARDWARE REMOVAL Left 06/07/2013   Procedure: HARDWARE REMOVAL;  Surgeon: Mcarthur Rossetti, MD;  Location: [facility];  Service: Orthopedics;  Laterality: Left;  . ORIF HIP FRACTURE Left 11/24/2012   Procedure: OPEN REDUCTION INTERNAL FIXATION HIP;  Surgeon: Patrick Jupiter  Luna Glasgow, MD;  Location: [facility];  Service: Orthopedics;  Laterality: Left;  . SPINE SURGERY    . TOTAL HIP ARTHROPLASTY Left 06/07/2013   Procedure: REMOVE HARDWARE LEFT HIP AND LEFT TOTAL HIP ARTHROPLASTY ANTERIOR APPROACH;  Surgeon: Mcarthur Rossetti, MD;  Location: [facility];  Service: Orthopedics;  Laterality: Left;  . TRANSURETHRAL RESECTION OF PROSTATE N/A 03/22/2013   Procedure:  TRANSURETHRAL RESECTION OF THE PROSTATE (TURP);  Surgeon: Marissa Nestle, MD;  [facility];  Service: Urology;  Laterality: N/A;       Home Medications    Prior to Admission medications   Medication Sig Start Date End Date Taking? Authorizing Provider  ALPRAZolam Duanne Moron) 1 MG tablet 1 pill every 6 hours for 2 days, then every 8 hrs for 2 days, then every 12 hours for 2 days then 1 pill a day for 2 days. 07/17/14  Yes Susy Frizzle, MD  amLODipine (NORVASC) 10 MG tablet Take 1 tablet (10 mg total) by mouth daily. 10/26/15  Yes Susy Frizzle, MD  aspirin EC 325 MG EC tablet Take 1 tablet (325 mg total) by mouth 2 (two) times daily after a meal. 06/10/13  Yes Mcarthur Rossetti, MD  fluocinonide-emollient (LIDEX-E) 0.05 % cream APPLY TO AFFECTED AREA TWICE DAILY. 10/18/15  Yes Susy Frizzle, MD  hydrochlorothiazide (HYDRODIURIL) 25 MG tablet Take 1 tablet (25 mg total) by mouth daily. 11/27/15  Yes Susy Frizzle, MD  hydrOXYzine (ATARAX/VISTARIL) 25 MG tablet Take 1 tablet (25 mg total) by mouth every 6 (six) hours as needed for itching. 08/18/14  Yes Orpah Greek, MD  PROAIR HFA 108 (90 BASE) MCG/ACT inhaler INHALE 2 PUFFS INTO THE LUNGS EVERY 4 TO 6 HOURS AS NEEDED. 02/08/15  Yes Susy Frizzle, MD  sertraline (ZOLOFT) 50 MG tablet Take 1 tablet (50 mg total) by mouth daily. 10/26/15  Yes Susy Frizzle, MD  Menthol, Topical Analgesic, (BIOFREEZE EX) Apply topically.    Historical Provider, MD  omeprazole (PRILOSEC) 20 MG capsule Take 1 capsule (20 mg total) by mouth daily. 09/01/12   Orlena Sheldon, PA-C    Family History Family History  Problem Relation Age of Onset  . Mental illness Sister   . Colon cancer Neg Hx     Social History Social History  Substance Use Topics  . Smoking status: Current Every Day Smoker    Packs/day: 0.50    Years: 30.00    Types: Cigarettes  . Smokeless tobacco: Never Used  . Alcohol use No     Comment: history of  about a  liter of wine per day-before that     Allergies   Review of patient's allergies indicates no known allergies.   Review of Systems Review of Systems  Constitutional: Positive for activity change, appetite change and fatigue. Negative for chills and fever.  HENT: Negative for congestion.   Respiratory: Negative for shortness of breath.   Cardiovascular: Negative for chest pain.  Gastrointestinal: Positive for abdominal pain, diarrhea, nausea and vomiting.       Denies hematemesis. Denies hematochezia and melena  Genitourinary: Negative for flank pain.  Musculoskeletal: Positive for gait problem. Negative for arthralgias and myalgias.  Skin: Negative for rash.  Neurological: Positive for tremors. Negative for headaches.  Psychiatric/Behavioral: Negative for confusion.     Physical Exam Updated Vital Signs BP 120/84   Pulse 92   Temp 98.1 F (36.7 C) (Oral)   Resp 17   SpO2 99%  Physical Exam  Constitutional: He is oriented to person, place, and time. He appears well-developed and well-nourished. He appears distressed.  Mild distress 2/2 diffuse tremors. Pleasant and cooperative. Thin and chronically ill-appearing  HENT:  Head: Normocephalic and atraumatic.  Mildly dry mucous membranes  Eyes: Conjunctivae and EOM are normal. Pupils are equal, round, and reactive to light. No scleral icterus.  Neck: Normal range of motion. Neck supple. No JVD present.  Cardiovascular: Regular rhythm and intact distal pulses.   Tachycardic to low 100's  Pulmonary/Chest: Effort normal and breath sounds normal. No respiratory distress.  Abdominal: Soft. He exhibits no distension. There is tenderness. There is no rebound and no guarding.  Epigastric and LUQ TTP. No CVA TTP  Musculoskeletal: He exhibits no edema.  Neurological: He is alert and oriented to person, place, and time. No cranial nerve deficit. He exhibits normal muscle tone. Coordination abnormal.  Resting and essential tremor  diffusely with asterixis. Symmetric 5/5 strength throughout b/l Ue's and b/l Le's  Skin: Skin is warm and dry. Capillary refill takes less than 2 seconds. No rash noted. He is not diaphoretic.  Normal cap refill but decreased skin turgor  Psychiatric: He has a normal mood and affect.  Nursing note and vitals reviewed.    ED Treatments / Results  Labs (all labs ordered are listed, but only abnormal results are displayed) Labs Reviewed  COMPREHENSIVE METABOLIC PANEL - Abnormal; Notable for the following:       Result Value   CO2 21 (*)    Glucose, Bld 110 (*)    BUN 5 (*)    Calcium 8.4 (*)    Albumin 3.3 (*)    AST 166 (*)    All other components within normal limits  CBC - Abnormal; Notable for the following:    WBC 1.9 (*)    Platelets 94 (*)    All other components within normal limits  URINALYSIS, ROUTINE W REFLEX MICROSCOPIC (NOT AT [facility]) - Abnormal; Notable for the following:    Color, Urine AMBER (*)    Ketones, ur 15 (*)    All other components within normal limits  LIPASE, BLOOD  MAGNESIUM  I-STAT CG4 LACTIC ACID, ED    EKG  EKG Interpretation None       Radiology Ct Abdomen Pelvis W Contrast  Result Date: 12/04/2015 CLINICAL DATA:  Epigastric and left upper quadrant pain. Generalized weakness. Nausea and vomiting. EXAM: CT ABDOMEN AND PELVIS WITH CONTRAST TECHNIQUE: Multidetector CT imaging of the abdomen and pelvis was performed using the standard protocol following bolus administration of intravenous contrast. CONTRAST:  100 cc Isovue-300 IV COMPARISON:  CT 03/05/2012 FINDINGS: Lower chest: Peripheral linear and patchy opacity in the medial lung bases, right greater than left. No pleural fluid. Normal heart size. Hepatobiliary: Decreased hepatic density. No focal hepatic lesion. Gallbladder physiologically distended, no calcified stone. No biliary dilatation. Pancreas: No ductal dilatation or inflammation. Spleen: Normal in size without focal abnormality.  Adrenals/Urinary Tract: Nonobstructing left nephrolithiasis, largest stone in the lower pole measuring 7 mm. No hydronephrosis. No ureteral stone. Homogeneous symmetric renal enhancement and excretion. The adrenal glands are normal. The bladder is physiologically distended. Stomach/Bowel: Small hiatal hernia. Fluid within the stomach which is not abnormally distended. Small bowel is decompressed. Liquid and solid stool throughout the colon. Equivocal colonic wall thickening involving the ascending and sigmoid colon. No definite descending colonic wall thickening. The appendix is normal. Vascular/Lymphatic: Aortic atherosclerosis. No aneurysm. Accessory left renal artery. No enlarged abdominal or  pelvic lymph nodes. Reproductive: Heterogeneously enlarged prostate gland spanning 5.2 cm transverse. Other: No abdominal wall hernia or abnormality. No abdominopelvic ascites. Musculoskeletal: Left hip arthroplasty with heterotopic calcification. Scattered degenerative change throughout the lumbar spine. Minimal compression deformity of L1 is new from 2014 but appears remote. Bone island within L5, stable. IMPRESSION: 1. Equivocal colonic wall thickening involving the ascending and sigmoid colon, with scattered liquid stool. Findings can be seen with colitis. No left upper quadrant bowel inflammation. 2. Nonobstructing left nephrolithiasis. 3. Linear and patchy opacities at the lung bases, right greater than left, likely atelectasis, recommend correlation for any suspicion of aspiration. 4. Hepatic steatosis. 5. Abdominal aortic atherosclerosis, no aneurysm. Electronically Signed   By: Jeb Levering M.D.   On: 12/04/2015 22:00    Procedures Procedures (including critical care time)  Medications Ordered in ED Medications  folic acid (FOLVITE) tablet 1 mg (1 mg Oral Given 12/04/15 1929)  sodium chloride 0.9 % bolus 1,000 mL (0 mLs Intravenous Stopped 12/04/15 1929)  LORazepam (ATIVAN) injection 2 mg (2 mg  Intravenous Given 12/04/15 1852)  chlordiazePOXIDE (LIBRIUM) capsule 100 mg (100 mg Oral Given 12/04/15 0100)  iopamidol (ISOVUE-300) 61 % injection (100 mLs  Contrast Given 12/04/15 2113)     Initial Impression / Assessment and Plan / ED Course  I have reviewed the triage vital signs and the nursing notes.  Pertinent labs & imaging results that were available during my care of the patient were reviewed by me and considered in my medical decision making (see chart for details).  Clinical Course   Denise Bramblett Nickelson is a 60 y.o. male with long-standing alcoholism and anorexia who presents to ED for general weakness/fatigue and tremors to point where he has been unable to ambulate with his cane x 2 days. Quit drinking 3 days ago, concern for beginning of DT's on exam, however, no confusion at this point. No focal weakness or numbness. Given '2mg'$  IV ativan and '100mg'$  PO librium in ED for management with near resolution of tremors. Pt also c/o epigastric pain and n/v/d x 2 days, no hematemesis, no melena. CT abdomen/pelvis c/w colitis. Labs largely unremarkable except for leukopenia, unusual in setting of DT's and colitis, should have leukocytosis. Noted to have leukopenia since 06/2014, now worse. Last HIV test in this system is 06/2014 and negative at that time. Will admit for further management of alcohol withdrawal, general fatigue, colitis, and may need further leukopenia workup.  Pt condition, course, and admission were discussed with attending physician Dr. Deno Etienne.  Final Clinical Impressions(s) / ED Diagnoses   Final diagnoses:  Alcohol withdrawal syndrome with complication [facility])  Colitis    New Prescriptions New Prescriptions   No medications on file     Paralee Cancel, MD 12/04/15 [place], DO 12/04/15 2310

## 2015-12-04 NOTE — H&P (Signed)
History and Physical    Duane Richardson PJA:[id number] DOB: 1955/03/13 DOA: 12/04/2015  PCP: Odette Fraction, MD  Patient coming from: Home  Chief Complaint: Generalized weakness, nausea, vomiting  HPI: Duane Richardson is a 60 y.o. male with history of HTN, depression, and alcohol abuse was brought to the ER after patient states he had a syncopal episode while visiting his opthamologist for a right eye problem. Patient is mildly somnolent after receiving librium and ativan for alcohol withdrawal. Patient is not able to clearly explain the circumstance of his syncopal episode. Patient denies any tongue bite or incontinence of urine. Feels he has been weak the last few days and has been unable to keep anything down due to nausea, vomiting, and epigastric abdominal pain for the last three days. He also was not able to take his regular alcohol intake. Patient also complains of vague chest pain which he is unable to, again, characterize. Labs in the ER show leukopenia, thrombocytopenia, and mildly elevated AST, with EKG showing borderline prolonged QT intervals. On my exam, patient appears non focal, tremulous, and appears generally weak. Patient is being admitted for generalized weakness, nausea, vomiting, abdominal pain, and possible syncopal episode, alcohol withdrawal.  ED Course: Patient was given a dose of IV ativan and librium. CT of abdomen and pelvis show features concerning for ascending and sigmoid colitis and possible aspiration.  Review of Systems: As per HPI, rest all negative.   Past Medical History:  Diagnosis Date  . Allergy   . Anxiety   . Arthritis   . Asthma   . Bipolar 1 disorder ([place])   . Cataract   . COPD (chronic obstructive pulmonary disease) ([place])   . Depression   . Elevated PSA   . ETOH abuse    quit Oct 2013, then relapsed, as of Dec 2013 now has abstained X 1 month  . GERD (gastroesophageal reflux disease)   . Hyperlipidemia   . Hypertension     Past Surgical  History:  Procedure Laterality Date  . BACK SURGERY     lunbar  . COLONOSCOPY  12/21/09   HAL:PFXT papilla otherwise normal/ pancolonic diverticula/mutiple colonic poylps  . COLONOSCOPY WITH ESOPHAGOGASTRODUODENOSCOPY (EGD) N/A 10/20/2012   Procedure: COLONOSCOPY WITH ESOPHAGOGASTRODUODENOSCOPY (EGD);  Surgeon: Daneil Dolin, MD;  [facility];  Service: Endoscopy;  Laterality: N/A;  10;15  . CYSTOSCOPY N/A 01/31/2013   Procedure: CYSTOSCOPY FLEXIBLE;  Surgeon: Marissa Nestle, MD;  Location: [facility];  Service: Urology;  Laterality: N/A;  I would like to do this around 1 pm on monday.   Marland Kitchen HARDWARE REMOVAL Left 06/07/2013   Procedure: HARDWARE REMOVAL;  Surgeon: Mcarthur Rossetti, MD;  Location: [place];  Service: Orthopedics;  Laterality: Left;  . ORIF HIP FRACTURE Left 11/24/2012   Procedure: OPEN REDUCTION INTERNAL FIXATION HIP;  Surgeon: Sanjuana Kava, MD;  Location: [facility];  Service: Orthopedics;  Laterality: Left;  . SPINE SURGERY    . TOTAL HIP ARTHROPLASTY Left 06/07/2013   Procedure: REMOVE HARDWARE LEFT HIP AND LEFT TOTAL HIP ARTHROPLASTY ANTERIOR APPROACH;  Surgeon: Mcarthur Rossetti, MD;  Location: [place];  Service: Orthopedics;  Laterality: Left;  . TRANSURETHRAL RESECTION OF PROSTATE N/A 03/22/2013   Procedure: TRANSURETHRAL RESECTION OF THE PROSTATE (TURP);  Surgeon: Marissa Nestle, MD;  Location: [facility];  Service: Urology;  Laterality: N/A;     reports that he has been smoking Cigarettes.  He has a 15.00 pack-year smoking history. He has never used  smokeless tobacco. He reports that he uses drugs, including Marijuana. He reports that he does not drink alcohol.  No Known Allergies  Family History  Problem Relation Age of Onset  . Mental illness Sister   . Colon cancer Neg Hx     Prior to Admission medications   Medication Sig Start Date End Date Taking? Authorizing Provider  ALPRAZolam Duanne Moron) 1 MG tablet 1 pill every 6 hours for 2 days, then every 8  hrs for 2 days, then every 12 hours for 2 days then 1 pill a day for 2 days. 07/17/14  Yes Susy Frizzle, MD  amLODipine (NORVASC) 10 MG tablet Take 1 tablet (10 mg total) by mouth daily. 10/26/15  Yes Susy Frizzle, MD  aspirin EC 325 MG EC tablet Take 1 tablet (325 mg total) by mouth 2 (two) times daily after a meal. 06/10/13  Yes Mcarthur Rossetti, MD  fluocinonide-emollient (LIDEX-E) 0.05 % cream APPLY TO AFFECTED AREA TWICE DAILY. 10/18/15  Yes Susy Frizzle, MD  hydrochlorothiazide (HYDRODIURIL) 25 MG tablet Take 1 tablet (25 mg total) by mouth daily. 11/27/15  Yes Susy Frizzle, MD  hydrOXYzine (ATARAX/VISTARIL) 25 MG tablet Take 1 tablet (25 mg total) by mouth every 6 (six) hours as needed for itching. 08/18/14  Yes Orpah Greek, MD  PROAIR HFA 108 (90 BASE) MCG/ACT inhaler INHALE 2 PUFFS INTO THE LUNGS EVERY 4 TO 6 HOURS AS NEEDED. 02/08/15  Yes Susy Frizzle, MD  sertraline (ZOLOFT) 50 MG tablet Take 1 tablet (50 mg total) by mouth daily. 10/26/15  Yes Susy Frizzle, MD  Menthol, Topical Analgesic, (BIOFREEZE EX) Apply topically.    Historical Provider, MD  omeprazole (PRILOSEC) 20 MG capsule Take 1 capsule (20 mg total) by mouth daily. 09/01/12   Orlena Sheldon, PA-C    Physical Exam: Vitals:   12/04/15 2103 12/04/15 2204 12/04/15 2215 12/04/15 2230  BP: 122/78 130/74 126/88 120/84  Pulse: 88 86 82 92  Resp: '18 16 16 17  '$ Temp:      TempSrc:      SpO2: 99% 99% 99% 99%      Constitutional:  Vitals:   12/04/15 2103 12/04/15 2204 12/04/15 2215 12/04/15 2230  BP: 122/78 130/74 126/88 120/84  Pulse: 88 86 82 92  Resp: '18 16 16 17  '$ Temp:      TempSrc:      SpO2: 99% 99% 99% 99%   Constitutional: Moderately build, poorly nourished. Eyes: Anicteric, no pallor  ENMT: No discharge from the ears, eyes, nose, and mouth  Neck: No JVD, no neck rigidity Respiratory: CTA, no wheezes or rhonchi Cardiovascular: No murmurs, rubs, or gallops  Abdomen: Mildly tender  in epigastric area. Bowel sounds present. No rigidity. Musculoskeletal: good ROM, moves all extremities  Skin: No rashes, lesions, or ulcers  Neurological: Patient appears drowsy but answers questions appropriately. Moves all extremities but appears generally weak. PEERLA. No facial asymmetry. Psychiatric: Judgement and insight intact, oriented x3. Normal affect. No signs of depression.   Labs on Admission: I have personally reviewed following labs and imaging studies  CBC:  Recent Labs Lab 12/04/15 1227  WBC 1.9*  HGB 14.7  HCT 43.1  MCV 94.7  PLT 94*   Basic Metabolic Panel:  Recent Labs Lab 12/04/15 1227  NA 139  K 4.2  CL 107  CO2 21*  GLUCOSE 110*  BUN 5*  CREATININE 0.83  CALCIUM 8.4*   GFR: Estimated Creatinine Clearance: 89.9 mL/min (by  C-G formula based on SCr of 0.83 mg/dL). Liver Function Tests:  Recent Labs Lab 12/04/15 1227  AST 166*  ALT 37  ALKPHOS 97  BILITOT 0.8  PROT 6.6  ALBUMIN 3.3*    Recent Labs Lab 12/04/15 1227  LIPASE 24   No results for input(s): AMMONIA in the last 168 hours. Coagulation Profile: No results for input(s): INR, PROTIME in the last 168 hours. Cardiac Enzymes: No results for input(s): CKTOTAL, CKMB, CKMBINDEX, TROPONINI in the last 168 hours. BNP (last 3 results) No results for input(s): PROBNP in the last 8760 hours. HbA1C: No results for input(s): HGBA1C in the last 72 hours. CBG: No results for input(s): GLUCAP in the last 168 hours. Lipid Profile: No results for input(s): CHOL, HDL, LDLCALC, TRIG, CHOLHDL, LDLDIRECT in the last 72 hours. Thyroid Function Tests: No results for input(s): TSH, T4TOTAL, FREET4, T3FREE, THYROIDAB in the last 72 hours. Anemia Panel: No results for input(s): VITAMINB12, FOLATE, FERRITIN, TIBC, IRON, RETICCTPCT in the last 72 hours. Urine analysis:    Component Value Date/Time   COLORURINE AMBER (A) 12/04/2015 1946   APPEARANCEUR CLEAR 12/04/2015 1946   LABSPEC 1.015  12/04/2015 1946   PHURINE 7.0 12/04/2015 1946   GLUCOSEU NEGATIVE 12/04/2015 1946   HGBUR NEGATIVE 12/04/2015 1946   [place] NEGATIVE 12/04/2015 1946   KETONESUR 15 (A) 12/04/2015 1946   PROTEINUR NEGATIVE 12/04/2015 1946   UROBILINOGEN 1.0 06/03/2013 1006   NITRITE NEGATIVE 12/04/2015 1946   LEUKOCYTESUR NEGATIVE 12/04/2015 1946   Sepsis Labs: '@LABRCNTIP'$ (procalcitonin:4,lacticidven:4) )No results found for this or any previous visit (from the past 240 hour(s)).   Radiological Exams on Admission: Ct Abdomen Pelvis W Contrast  Result Date: 12/04/2015 CLINICAL DATA:  Epigastric and left upper quadrant pain. Generalized weakness. Nausea and vomiting. EXAM: CT ABDOMEN AND PELVIS WITH CONTRAST TECHNIQUE: Multidetector CT imaging of the abdomen and pelvis was performed using the standard protocol following bolus administration of intravenous contrast. CONTRAST:  100 cc Isovue-300 IV COMPARISON:  CT 03/05/2012 FINDINGS: Lower chest: Peripheral linear and patchy opacity in the medial lung bases, right greater than left. No pleural fluid. Normal heart size. Hepatobiliary: Decreased hepatic density. No focal hepatic lesion. Gallbladder physiologically distended, no calcified stone. No biliary dilatation. Pancreas: No ductal dilatation or inflammation. Spleen: Normal in size without focal abnormality. Adrenals/Urinary Tract: Nonobstructing left nephrolithiasis, largest stone in the lower pole measuring 7 mm. No hydronephrosis. No ureteral stone. Homogeneous symmetric renal enhancement and excretion. The adrenal glands are normal. The bladder is physiologically distended. Stomach/Bowel: Small hiatal hernia. Fluid within the stomach which is not abnormally distended. Small bowel is decompressed. Liquid and solid stool throughout the colon. Equivocal colonic wall thickening involving the ascending and sigmoid colon. No definite descending colonic wall thickening. The appendix is normal. Vascular/Lymphatic:  Aortic atherosclerosis. No aneurysm. Accessory left renal artery. No enlarged abdominal or pelvic lymph nodes. Reproductive: Heterogeneously enlarged prostate gland spanning 5.2 cm transverse. Other: No abdominal wall hernia or abnormality. No abdominopelvic ascites. Musculoskeletal: Left hip arthroplasty with heterotopic calcification. Scattered degenerative change throughout the lumbar spine. Minimal compression deformity of L1 is new from 2014 but appears remote. Bone island within L5, stable. IMPRESSION: 1. Equivocal colonic wall thickening involving the ascending and sigmoid colon, with scattered liquid stool. Findings can be seen with colitis. No left upper quadrant bowel inflammation. 2. Nonobstructing left nephrolithiasis. 3. Linear and patchy opacities at the lung bases, right greater than left, likely atelectasis, recommend correlation for any suspicion of aspiration. 4. Hepatic steatosis. 5. Abdominal  aortic atherosclerosis, no aneurysm. Electronically Signed   By: Jeb Levering M.D.   On: 12/04/2015 22:00    EKG: Independently reviewed. Normal sinus rhythm with borderline prolonged QT at 498 miliseconds.   Assessment/Plan Active Problems:   Alcohol withdrawal (HCC)    1. Generalized weakness - Possibly secondary to deconditioning and dehydration from nausea, vomiting, and alcohol abuse. Will gently hydrate at this time. Get physical therapy consult. Treat nausea and vomiting. Get social work consult for possible rehab placement. 2. Nausea and vomiting with abdominal pain - CT scan shows possible colitis involving the ascending and sigmoid colon with loose stools. Check lactate levels to rule out ischemic colitis. If patient has diarrhea, will check stool studies. For now, I have placed the patient on fluids and cipro and flagyl. Abdomen is soft and will closely follow clinically. 3. Syncope - Patiens states he passed out. Circumstances are not clear. EKG does show borderline prolonged QT.  Will closely monitor in telemetry for arhythmias. Check orthostatics, 2D echo, CT head, get physical therapy consult, and check mag levels. Also check EEG to make sure that there was no seizure episode. 4. Alochol abuse with withdrawal - Patient was unable to eat anything for the last three days and has also not taken his alcohol intake. Patient is tremulous on exam. Will place patient on CIWA protocol.  5. Leukopenia and thrombocytopenia - Probably secondary to alcohol induced bone marrow suppression. Will check HIV and closely follow CBC with differential for any further worsening. Patient is afebrile at this time.  6. Mildly elevated AST - Probably secondary to alcohol. Will check acute hepatitis panel. 7. Hypertension - For now, I am placing patient on PRN IV hydralazine along with home dose of amlodapine. Will hold off hydrochlorothiazide since we are hydrating the patinet. 8. Hx of depression - No sign of suicidal toughts. Will continue zoloft. 9. Possible aspiration - Chest xray pending. Seen in the CT scan of abodmen, for now patient is on empiric antibiotics, cipro and flagyl.   Patient has some issues with his right eye, not sure what it is. CT head, chest xray, and xray of pelvis are pending.  Since patient has multiple medical issues at this time, requiring close attention, will admit patient as inpatient.   DVT prophylaxis: SCDs Code Status: Full code Family Communication: No family bedside  Disposition Plan: To be determined Consults called: Physical therapy and social work Admission status: Inpatient, likely to stay for 2-3 days   Laurey Morale, MD  [facility] Pager [phone].  If 7PM-7AM, please contact night-coverage [email or web address] Password [email or web address]  12/04/2015, 11:06 PM   By signing my name below, I, Clerance Lav, attest that this documentation has been prepared under the direction and in the presence of Gean Birchwood, MD. Electronically signed: Clerance Lav, Scribe. 12/04/15 10:07 PM

## 2015-12-05 DIAGNOSIS — D72819 Decreased white blood cell count, unspecified: Secondary | ICD-10-CM

## 2015-12-05 DIAGNOSIS — F10230 Alcohol dependence with withdrawal, uncomplicated: Secondary | ICD-10-CM

## 2015-12-05 DIAGNOSIS — E876 Hypokalemia: Secondary | ICD-10-CM

## 2015-12-05 DIAGNOSIS — F1010 Alcohol abuse, uncomplicated: Secondary | ICD-10-CM

## 2015-12-05 DIAGNOSIS — I10 Essential (primary) hypertension: Secondary | ICD-10-CM

## 2015-12-05 DIAGNOSIS — R531 Weakness: Secondary | ICD-10-CM

## 2015-12-05 DIAGNOSIS — K529 Noninfective gastroenteritis and colitis, unspecified: Secondary | ICD-10-CM

## 2015-12-05 DIAGNOSIS — D696 Thrombocytopenia, unspecified: Secondary | ICD-10-CM | POA: Diagnosis present

## 2015-12-05 DIAGNOSIS — R112 Nausea with vomiting, unspecified: Secondary | ICD-10-CM

## 2015-12-05 DIAGNOSIS — R55 Syncope and collapse: Secondary | ICD-10-CM

## 2015-12-05 LAB — HEPATIC FUNCTION PANEL
ALT: 43 U/L (ref 17–63)
AST: 238 U/L — ABNORMAL HIGH (ref 15–41)
Albumin: 2.6 g/dL — ABNORMAL LOW (ref 3.5–5.0)
Alkaline Phosphatase: 84 U/L (ref 38–126)
Bilirubin, Direct: 0.3 mg/dL (ref 0.1–0.5)
Indirect Bilirubin: 0.3 mg/dL (ref 0.3–0.9)
Total Bilirubin: 0.6 mg/dL (ref 0.3–1.2)
Total Protein: 5.3 g/dL — ABNORMAL LOW (ref 6.5–8.1)

## 2015-12-05 LAB — BASIC METABOLIC PANEL
Anion gap: 10 (ref 5–15)
BUN: <5 mg/dL — ABNORMAL LOW (ref 6–20)
CO2: 19 mmol/L — ABNORMAL LOW (ref 22–32)
Calcium: 7.5 mg/dL — ABNORMAL LOW (ref 8.9–10.3)
Chloride: 108 mmol/L (ref 101–111)
Creatinine, Ser: 0.7 mg/dL (ref 0.61–1.24)
GFR calc Af Amer: >60 mL/min (ref >60)
GFR calc non Af Amer: >60 mL/min (ref >60)
Glucose, Bld: 164 mg/dL — ABNORMAL HIGH (ref 65–99)
Potassium: 3.3 mmol/L — ABNORMAL LOW (ref 3.5–5.1)
Sodium: 137 mmol/L (ref 135–145)

## 2015-12-05 LAB — TROPONIN I
Troponin I: <0.03 ng/mL (ref <0.03)
Troponin I: <0.03 ng/mL (ref <0.03)

## 2015-12-05 LAB — CBC
HCT: 39.7 % (ref 39.0–52.0)
Hemoglobin: 13.6 g/dL (ref 13.0–17.0)
MCH: 32.2 pg (ref 26.0–34.0)
MCHC: 34.3 g/dL (ref 30.0–36.0)
MCV: 94.1 fL (ref 78.0–100.0)
Platelets: 85 10*3/uL — ABNORMAL LOW (ref 150–400)
RBC: 4.22 MIL/uL (ref 4.22–5.81)
RDW: 13.8 % (ref 11.5–15.5)
WBC: 2.1 10*3/uL — ABNORMAL LOW (ref 4.0–10.5)

## 2015-12-05 LAB — TSH: TSH: 0.917 u[IU]/mL (ref 0.350–4.500)

## 2015-12-05 LAB — HIV ANTIBODY (ROUTINE TESTING W REFLEX)
HIV Screen 4th Generation wRfx: NONREACTIVE
HIV Screen 4th Generation wRfx: NONREACTIVE

## 2015-12-05 LAB — MAGNESIUM: Magnesium: 1.6 mg/dL — ABNORMAL LOW (ref 1.7–2.4)

## 2015-12-05 LAB — CK: Total CK: 143 U/L (ref 49–397)

## 2015-12-05 LAB — LACTIC ACID, PLASMA: Lactic Acid, Venous: 1.3 mmol/L (ref 0.5–1.9)

## 2015-12-05 MED ORDER — POTASSIUM CHLORIDE CRYS ER 20 MEQ PO TBCR
40.0000 meq | EXTENDED_RELEASE_TABLET | Freq: Once | ORAL | Status: AC
Start: 2015-12-05 — End: 2015-12-05
  Administered 2015-12-05: 40 meq via ORAL
  Filled 2015-12-05: qty 2

## 2015-12-05 MED ORDER — ENSURE ENLIVE PO LIQD
237.0000 mL | Freq: Two times a day (BID) | ORAL | Status: DC
  Administered 2015-12-05: 237 mL via ORAL

## 2015-12-05 MED ORDER — MAGNESIUM OXIDE 400 (241.3 MG) MG PO TABS
400.0000 mg | ORAL_TABLET | Freq: Two times a day (BID) | ORAL | Status: AC
Start: 2015-12-05 — End: 2015-12-05
  Administered 2015-12-05 (×2): 400 mg via ORAL
  Filled 2015-12-05 (×2): qty 1

## 2015-12-05 MED ORDER — INFLUENZA VAC SPLIT QUAD 0.5 ML IM SUSY
0.5000 mL | PREFILLED_SYRINGE | INTRAMUSCULAR | Status: DC
  Filled 2015-12-05: qty 0.5

## 2015-12-05 MED ORDER — PIPERACILLIN-TAZOBACTAM 3.375 G IVPB
3.3750 g | Freq: Three times a day (TID) | INTRAVENOUS | Status: DC
  Administered 2015-12-05 (×4): 3.375 g via INTRAVENOUS
  Filled 2015-12-05 (×7): qty 50

## 2015-12-05 NOTE — Progress Notes (Signed)
New Admission Note:  Arrival Method: Stretcher from ED. Mental Orientation: A&O x4 Telemetry: Box 11, CCMD notified.  Assessment: Completed Skin: Assessed with Charito B, RN, check flowsheet IV: L. FA, SL Pain: 0/10 Tubes: N/A Safety Measures: Safety Fall Prevention Plan discussed with the patient Admission: Completed [address] Orientation: Patient has been orientated to the room, unit and the staff. Family: None at bedside  Orders have been reviewed and implemented. Will continue to monitor the patient. Call light has been placed within reach and bed alarm has been activated.   Nena Polio BSN, RN  Phone Number: [phone]

## 2015-12-05 NOTE — Progress Notes (Signed)
Initial Nutrition Assessment  DOCUMENTATION CODES:   Not applicable  INTERVENTION:  Provide Ensure Enlive po BID, each supplement provides 350 kcal and 20 grams of protein.  Encourage adequate PO intake.   NUTRITION DIAGNOSIS:   Inadequate oral intake related to poor appetite as evidenced by per patient/family report.  GOAL:   Patient will meet greater than or equal to 90% of their needs  MONITOR:   PO intake, Supplement acceptance, Labs, Weight trends, Skin, I & O's  REASON FOR ASSESSMENT:   Malnutrition Screening Tool    ASSESSMENT:   60 y.o. male with history of HTN, depression, and alcohol abuse was brought to the ER after patient states he had a syncopal episode. Feels he has been weak the last few days and has been unable to keep anything down due to nausea, vomiting, and epigastric abdominal pain for the last three days.  CT of abdomen and pelvis show features concerning for ascending and sigmoid colitis and possible aspiration.  Meal completion 25-100%. Pt reports having a decreased appetite. Pt unable to keep down foods the 3 days PTA due to n/v, however does report consuming 4 Ensure Shakes on those days. Prior to illness, pt reports usually consuming 3 meals a day. Pt with no weight loss per weight records. Pt is agreeable to nutritional supplements to aid in caloric and protein needs. RD to order.   Nutrition-Focused physical exam completed. Findings are no fat depletion, moderate muscle depletion, and no edema.   Labs and medications reviewed. Potassium low at 3.3 and Magnesium low at 1.6. AST elevated at 238.  Diet Order:  Diet Heart Room service appropriate? Yes; Fluid consistency: Thin  Skin:  Reviewed, no issues  Last BM:  10/11  Height:   Ht Readings from Last 1 Encounters:  12/05/15 '5\' 8"'$  (1.727 m)    Weight:   Wt Readings from Last 1 Encounters:  12/05/15 150 lb 8 oz (68.3 kg)    Ideal Body Weight:  70 kg  BMI:  Body mass index is 22.88  kg/m.  Estimated Nutritional Needs:   Kcal:  1800-2000  Protein:  80-95 grams  Fluid:  1.8-2 L/day  EDUCATION NEEDS:   No education needs identified at this time  Corrin Parker, MS, RD, LDN Pager # [phone] After hours/ weekend pager # [phone]

## 2015-12-05 NOTE — Progress Notes (Signed)
CIWA protocol -2 at 6 pm, ativan not given.

## 2015-12-05 NOTE — Progress Notes (Signed)
PROGRESS NOTE    Duane Richardson  OIZ:[id number] DOB: [birth year] DOA: 12/04/2015 PCP: Odette Fraction, MD     Brief Narrative:  Duane Richardson is a 60 y.o. male with history of HTN, depression, and alcohol abuse was brought to the ER after patient states he had a syncopal episode while visiting his opthamologist for a right eye problem. Patient is mildly somnolent after receiving librium and ativan for alcohol withdrawal. Patient is not able to clearly explain the circumstance of his syncopal episode. Patient denies any tongue bite or incontinence of urine. Feels he has been weak the last few days and has been unable to keep anything down due to nausea, vomiting, and epigastric abdominal pain for the last three days. He also was not able to take his regular alcohol intake. Patient also complains of vague chest pain which he is unable to, again, characterize. Patient is admitted for generalized weakness, nausea, vomiting, abdominal pain, and possible syncopal episode, alcohol withdrawal. CT of abdomen and pelvis show features concerning for ascending and sigmoid colitis and possible aspiration.   Assessment & Plan:   Principal Problem:   Generalized weakness Active Problems:   Leucopenia   ETOH abuse   HTN (hypertension)   Alcohol withdrawal (HCC)   Nausea & vomiting   Acute colitis   Syncope   Hypokalemia   Hypomagnesemia   Thrombocytopenia (HCC)  Generalized weakness -Possibly secondary to deconditioning and dehydration from nausea, vomiting, diarrhea, and alcohol abuse -IVF -PT eval   Ascending and sigmoid colon colitis  -Lactic acid normal -Empiric zosyn  -Stool studies   Syncope -Patient states he passed out. Circumstances are not clear -EKG does show borderline prolonged QT. Will closely monitor in telemetry for arrhythmias -Obtain orthostatic VS  Hypokalemia -Due to diarrhea, GI loss -Replace  Hypomagnesemia -Replace   Alochol abuse with withdrawal  -Patient  was unable to eat anything for the last three days and has also not taken his alcohol intake. CIWA protocol.  -Folic acid, thiamine daily   Leukopenia and thrombocytopenia  -Probably secondary to alcohol induced bone marrow suppression -Will check HIV - pending  -Closely follow CBC with differential   Mildly elevated AST  -Probably secondary to alcohol. Will check acute hepatitis panel - pending   Hypertension  -PRN IV hydralazine along with home dose of amlodapine  Depression  -Continue zoloft  Possible aspiration  -Chest xray negative for active cardiopulm disease   DVT prophylaxis: SCDs Code Status: Full Family Communication: no family at bedside Disposition Plan: IV abx, stool studies pending. PT eval.    Consultants:   None  Procedures:   None  Antimicrobials:   Zosyn 10/10 >>     Subjective: Patient states that he has had nausea, vomiting, diarrhea for a few days prior to admission and hasn't had any appetite for any food for a few days. He denies any alcohol use in the past 3 days, but states that normally he drinks a pint of beer and/or wine every day. He denies any fevers, chills, abdominal pain, chest pain, SOB.   Objective: Vitals:   12/05/15 0000 12/05/15 0045 12/05/15 0425 12/05/15 1018  BP: 137/99 (!) 144/91 131/82 116/72  Pulse: 76 (!) 51 94 80  Resp: '18 20 20 20  '$ Temp:  99.1 F (37.3 C) 99.1 F (37.3 C) 98.4 F (36.9 C)  TempSrc:  Oral Oral Oral  SpO2: 97% 96% 97% 99%  Weight:  68.3 kg (150 lb 8 oz)    Height:  $'5\' 8"'g$  (1.727 m)      Intake/Output Summary (Last 24 hours) at 12/05/15 1210 Last data filed at 12/05/15 0900  Gross per 24 hour  Intake          1696.25 ml  Output             1301 ml  Net           395.25 ml   Filed Weights   12/05/15 0045  Weight: 68.3 kg (150 lb 8 oz)    Examination:  General exam: Appears calm and comfortable  Respiratory system: Clear to auscultation. Respiratory effort normal. Cardiovascular  system: S1 & S2 heard, RRR. No JVD, murmurs, rubs, gallops or clicks. No pedal edema. Gastrointestinal system: Abdomen is nondistended, soft and nontender. No organomegaly or masses felt. Normal bowel sounds heard. Central nervous system: Alert and oriented. No focal neurological deficits. Extremities: Symmetric 5 x 5 power. Skin: No rashes, lesions or ulcers Psychiatry: Judgement and insight appear normal. Mood & affect appropriate.   Data Reviewed: I have personally reviewed following labs and imaging studies  CBC:  Recent Labs Lab 12/04/15 1227 12/05/15 0528  WBC 1.9* 2.1*  HGB 14.7 13.6  HCT 43.1 39.7  MCV 94.7 94.1  PLT 94* 85*   Basic Metabolic Panel:  Recent Labs Lab 12/04/15 1227 12/05/15 0148 12/05/15 0528  NA 139  --  137  K 4.2  --  3.3*  CL 107  --  108  CO2 21*  --  19*  GLUCOSE 110*  --  164*  BUN 5*  --  <5*  CREATININE 0.83  --  0.70  CALCIUM 8.4*  --  7.5*  MG  --  1.6*  --    GFR: Estimated Creatinine Clearance: 92.5 mL/min (by C-G formula based on SCr of 0.7 mg/dL). Liver Function Tests:  Recent Labs Lab 12/04/15 1227 12/05/15 0528  AST 166* 238*  ALT 37 43  ALKPHOS 97 84  BILITOT 0.8 0.6  PROT 6.6 5.3*  ALBUMIN 3.3* 2.6*    Recent Labs Lab 12/04/15 1227  LIPASE 24   No results for input(s): AMMONIA in the last 168 hours. Coagulation Profile: No results for input(s): INR, PROTIME in the last 168 hours. Cardiac Enzymes:  Recent Labs Lab 12/04/15 2343 12/05/15 0528  CKTOTAL 143  --   TROPONINI <0.03 <0.03   BNP (last 3 results) No results for input(s): PROBNP in the last 8760 hours. HbA1C: No results for input(s): HGBA1C in the last 72 hours. CBG: No results for input(s): GLUCAP in the last 168 hours. Lipid Profile: No results for input(s): CHOL, HDL, LDLCALC, TRIG, CHOLHDL, LDLDIRECT in the last 72 hours. Thyroid Function Tests:  Recent Labs  12/04/15 2343  TSH 0.917   Anemia Panel: No results for input(s):  VITAMINB12, FOLATE, FERRITIN, TIBC, IRON, RETICCTPCT in the last 72 hours. Sepsis Labs:  Recent Labs Lab 12/04/15 2301 12/04/15 2343  LATICACIDVEN 1.04 1.3    No results found for this or any previous visit (from the past 240 hour(s)).     Radiology Studies: Ct Head Wo Contrast  Result Date: 12/04/2015 CLINICAL DATA:  Initial evaluation for weakness for several days. EXAM: CT HEAD WITHOUT CONTRAST TECHNIQUE: Contiguous axial images were obtained from the base of the skull through the vertex without intravenous contrast. COMPARISON:  Prior CT from 07/09/2015. FINDINGS: Brain: Generalized cerebral atrophy with mild chronic microvascular ischemic disease. No acute intracranial hemorrhage. No evidence for acute large vessel territory infarct. No  mass lesion, midline shift, or mass effect. No hydrocephalus. No extra-axial fluid collection. Vascular: No hyperdense vessel. Scattered vascular calcifications noted within the carotid siphons. Skull: Scalp soft tissues within normal limits. The calvarium intact. Sinuses/Orbits: Globes and orbits within normal limits. Visualized paranasal sinuses are clear. No mastoid effusion. IMPRESSION: 1. No acute intracranial process identified. 2. Mild atrophy with chronic microvascular ischemic disease. Electronically Signed   By: Jeannine Boga M.D.   On: 12/04/2015 23:52   Ct Abdomen Pelvis W Contrast  Result Date: 12/04/2015 CLINICAL DATA:  Epigastric and left upper quadrant pain. Generalized weakness. Nausea and vomiting. EXAM: CT ABDOMEN AND PELVIS WITH CONTRAST TECHNIQUE: Multidetector CT imaging of the abdomen and pelvis was performed using the standard protocol following bolus administration of intravenous contrast. CONTRAST:  100 cc Isovue-300 IV COMPARISON:  CT 03/05/2012 FINDINGS: Lower chest: Peripheral linear and patchy opacity in the medial lung bases, right greater than left. No pleural fluid. Normal heart size. Hepatobiliary: Decreased hepatic  density. No focal hepatic lesion. Gallbladder physiologically distended, no calcified stone. No biliary dilatation. Pancreas: No ductal dilatation or inflammation. Spleen: Normal in size without focal abnormality. Adrenals/Urinary Tract: Nonobstructing left nephrolithiasis, largest stone in the lower pole measuring 7 mm. No hydronephrosis. No ureteral stone. Homogeneous symmetric renal enhancement and excretion. The adrenal glands are normal. The bladder is physiologically distended. Stomach/Bowel: Small hiatal hernia. Fluid within the stomach which is not abnormally distended. Small bowel is decompressed. Liquid and solid stool throughout the colon. Equivocal colonic wall thickening involving the ascending and sigmoid colon. No definite descending colonic wall thickening. The appendix is normal. Vascular/Lymphatic: Aortic atherosclerosis. No aneurysm. Accessory left renal artery. No enlarged abdominal or pelvic lymph nodes. Reproductive: Heterogeneously enlarged prostate gland spanning 5.2 cm transverse. Other: No abdominal wall hernia or abnormality. No abdominopelvic ascites. Musculoskeletal: Left hip arthroplasty with heterotopic calcification. Scattered degenerative change throughout the lumbar spine. Minimal compression deformity of L1 is new from 2014 but appears remote. Bone island within L5, stable. IMPRESSION: 1. Equivocal colonic wall thickening involving the ascending and sigmoid colon, with scattered liquid stool. Findings can be seen with colitis. No left upper quadrant bowel inflammation. 2. Nonobstructing left nephrolithiasis. 3. Linear and patchy opacities at the lung bases, right greater than left, likely atelectasis, recommend correlation for any suspicion of aspiration. 4. Hepatic steatosis. 5. Abdominal aortic atherosclerosis, no aneurysm. Electronically Signed   By: Jeb Levering M.D.   On: 12/04/2015 22:00   Dg Chest Port 1 View  Result Date: 12/05/2015 CLINICAL DATA:  Initial  evaluation for generalized weakness. EXAM: PORTABLE CHEST 1 VIEW COMPARISON:  Prior radiograph from 12/06/2014. FINDINGS: Cardiac and mediastinal silhouettes are within normal limits. Mild aortic atherosclerosis noted. The lungs are normally inflated. No airspace consolidation, pleural effusion, or pulmonary edema is identified. There is no pneumothorax. No acute osseous abnormality identified. IMPRESSION: 1. No active cardiopulmonary disease. 2. Mild aortic atherosclerosis. Electronically Signed   By: Jeannine Boga M.D.   On: 12/05/2015 00:32      Scheduled Meds: . amLODipine  10 mg Oral Daily  . folic acid  1 mg Oral Daily  . [START ON 12/06/2015] Influenza vac split quadrivalent PF  0.5 mL Intramuscular Tomorrow-1000  . LORazepam  0-4 mg Intravenous Q6H   Followed by  . [START ON 12/07/2015] LORazepam  0-4 mg Intravenous Q12H  . magnesium oxide  400 mg Oral BID  . multivitamin with minerals  1 tablet Oral Daily  . piperacillin-tazobactam (ZOSYN)  IV  3.375 g Intravenous  Q8H  . sertraline  50 mg Oral Daily  . thiamine  100 mg Oral Daily   Or  . thiamine  100 mg Intravenous Daily   Continuous Infusions: . sodium chloride 75 mL/hr at 12/05/15 0123     LOS: 1 day    Time spent: 40 minutes    Dessa Phi, DO [facility] Pager [phone]  If 7PM-7AM, please contact night-coverage [email or web address] Password [email or web address] 12/05/2015, 12:10 PM

## 2015-12-05 NOTE — Progress Notes (Signed)
Pharmacy Antibiotic Note  Duane Richardson is a 60 y.o. male admitted on 12/04/2015 with intra-abdominal infection.  Pharmacy has been consulted for Zosyn dosing. Pt is leukopenic with WBC 1.9. Afebrile.   Plan: -Zosyn 3.375G IV q8h to be infused over 4 hours -Trend WBC, temp, renal function -F/U infectious work-up  Temp (24hrs), Avg:98.1 F (36.7 C), Min:98 F (36.7 C), Max:98.1 F (36.7 C)   Recent Labs Lab 12/04/15 1227 12/04/15 2301  WBC 1.9*  --   CREATININE 0.83  --   LATICACIDVEN  --  1.04    Estimated Creatinine Clearance: 89.9 mL/min (by C-G formula based on SCr of 0.83 mg/dL).    No Known Allergies   Narda Bonds 12/05/2015 12:13 AM

## 2015-12-05 NOTE — Evaluation (Signed)
Physical Therapy Evaluation Patient Details Name: Tyaire Odem Doswell MRN: [id number] DOB: [birth year] Today's Date: 12/05/2015   History of Present Illness  Hakan Nudelman Lubin is a 60 y.o. male with history of HTN, depression, and alcohol abuse was brought to the ER after patient states he had a syncopal episode while visiting his opthamologist for a right eye problem; with nausea and vomiting as well, and recently unable to eat or drink; dehydration with possible alcohol withdrawal, on CIWA protocol.  Clinical Impression   Pt admitted with above diagnosis. Pt currently with functional limitations due to the deficits listed below (see PT Problem List). Orhtostatics were positive for a drop in SBP coupled with an incr in HR; Pt asymptomatic for dizziness or lightheadedness; Pt will benefit from skilled PT to increase their independence and safety with mobility to allow discharge to the venue listed below.      12/05/15 1512  Vital Signs  Patient Position (if appropriate) Orthostatic Vitals  Orthostatic Lying   BP- Lying (!) 152/94  Pulse- Lying 79  Orthostatic Sitting  BP- Sitting (!) 136/98  Pulse- Sitting 100  Orthostatic Standing at 0 minutes  BP- Standing at 0 minutes (!) 118/91  Pulse- Standing at 0 minutes 109  Orthostatic Standing at 3 minutes  BP- Standing at 3 minutes 128/89  Pulse- Standing at 3 minutes 116       Follow Up Recommendations Home health PT    Equipment Recommendations  3in1 (PT)    Recommendations for Other Services OT consult     Precautions / Restrictions Precautions Precautions: Fall Restrictions Weight Bearing Restrictions: No      Mobility  Bed Mobility Overal bed mobility: Needs Assistance Bed Mobility: Supine to Sit     Supine to sit: Supervision     General bed mobility comments: Cues to self-monitor for activity tolerance  Transfers Overall transfer level: Needs assistance Equipment used: 1 person hand held assist Transfers: Sit  to/from Stand Sit to Stand: Min assist;Mod assist         General transfer comment: min assist tosteady from bed; light mod assist and use of rails to rise from low toilet seat  Ambulation/Gait Ambulation/Gait assistance: Min guard Ambulation Distance (Feet): 18 Feet (to and from bathroom) Assistive device:  (pushing IV pole) Gait Pattern/deviations: Step-through pattern     General Gait Details: dependent on UE support  Stairs            Wheelchair Mobility    Modified Rankin (Stroke Patients Only)       Balance Overall balance assessment: Needs assistance   Sitting balance-Leahy Scale: Good       Standing balance-Leahy Scale: Fair                               Pertinent Vitals/Pain Pain Assessment: No/denies pain    Home Living Family/patient expects to be discharged to:: Private residence Living Arrangements: Alone Available Help at Discharge: Family;Friend(s);Available PRN/intermittently Type of Home: House Home Access: Ramped entrance     Home Layout: One level   Additional Comments: Mr. Minahan describes a "hop-around" he sits in to get around; I'm not sure if he is referring to a motorized wheelchair or a rollator RW he sits in and pushes -- will need clarification    Prior Function Level of Independence: Independent with assistive device(s)         Comments: uses quad cane prn, and his "hop-around" --  wc versus sitting in rollator     Hand Dominance        Extremity/Trunk Assessment   Upper Extremity Assessment: Overall WFL for tasks assessed           Lower Extremity Assessment: Generalized weakness         Communication   Communication: Other (comment) (at times difficult to understand, but repeats himself gladly)  Cognition Arousal/Alertness: Awake/alert Behavior During Therapy: WFL for tasks assessed/performed Overall Cognitive Status: No family/caregiver present to determine baseline cognitive  functioning                      General Comments      Exercises     Assessment/Plan    PT Assessment Patient needs continued PT services  PT Problem List Decreased strength;Decreased activity tolerance;Decreased balance;Decreased mobility;Decreased coordination;Decreased knowledge of use of DME;Decreased knowledge of precautions          PT Treatment Interventions DME instruction;Gait training;Functional mobility training;Therapeutic activities;Therapeutic exercise;Balance training;Cognitive remediation;Patient/family education    PT Goals (Current goals can be found in the Care Plan section)  Acute Rehab PT Goals Patient Stated Goal: Hopes to be home soon PT Goal Formulation: With patient Time For Goal Achievement: 12/19/15 Potential to Achieve Goals: Good    Frequency Min 3X/week   Barriers to discharge        Co-evaluation               End of Session Equipment Utilized During Treatment: Gait belt Activity Tolerance: Patient tolerated treatment well Patient left: in bed;with call bell/phone within reach;with bed alarm set Nurse Communication: Mobility status         Time: 4481-8563 PT Time Calculation (min) (ACUTE ONLY): 30 min   Charges:   PT Evaluation $PT Eval Moderate Complexity: 1 Procedure PT Treatments $Therapeutic Activity: 8-22 mins   PT G Codes:        Quin Hoop 12/05/2015, 4:56 PM  Roney Marion, [place] Pager [phone] Office [phone]

## 2015-12-06 LAB — CBC WITH DIFFERENTIAL/PLATELET
Basophils Absolute: 0 10*3/uL (ref 0.0–0.1)
Basophils Relative: 2 %
Eosinophils Absolute: 0 10*3/uL (ref 0.0–0.7)
Eosinophils Relative: 1 %
HCT: 40.5 % (ref 39.0–52.0)
Hemoglobin: 13.9 g/dL (ref 13.0–17.0)
Lymphocytes Relative: 46 %
Lymphs Abs: 1 10*3/uL (ref 0.7–4.0)
MCH: 32.2 pg (ref 26.0–34.0)
MCHC: 34.3 g/dL (ref 30.0–36.0)
MCV: 93.8 fL (ref 78.0–100.0)
Monocytes Absolute: 0.4 10*3/uL (ref 0.1–1.0)
Monocytes Relative: 22 %
Neutro Abs: 0.6 10*3/uL — ABNORMAL LOW (ref 1.7–7.7)
Neutrophils Relative %: 29 %
Platelets: 79 10*3/uL — ABNORMAL LOW (ref 150–400)
RBC: 4.32 MIL/uL (ref 4.22–5.81)
RDW: 13.6 % (ref 11.5–15.5)
Smear Review: ADEQUATE
WBC: 2 10*3/uL — ABNORMAL LOW (ref 4.0–10.5)

## 2015-12-06 LAB — HEPATITIS PANEL, ACUTE
HCV Ab: <0.1 s/co ratio (ref 0.0–0.9)
HCV Ab: <0.1 s/co ratio (ref 0.0–0.9)
Hep A IgM: NEGATIVE
Hep A IgM: NEGATIVE
Hep B C IgM: NEGATIVE
Hep B C IgM: NEGATIVE
Hepatitis B Surface Ag: NEGATIVE
Hepatitis B Surface Ag: NEGATIVE

## 2015-12-06 LAB — GASTROINTESTINAL PANEL BY PCR, STOOL (REPLACES STOOL CULTURE)

## 2015-12-06 LAB — BASIC METABOLIC PANEL
Anion gap: 6 (ref 5–15)
BUN: <5 mg/dL — ABNORMAL LOW (ref 6–20)
CO2: 22 mmol/L (ref 22–32)
Calcium: 7.6 mg/dL — ABNORMAL LOW (ref 8.9–10.3)
Chloride: 106 mmol/L (ref 101–111)
Creatinine, Ser: 0.71 mg/dL (ref 0.61–1.24)
GFR calc Af Amer: >60 mL/min (ref >60)
GFR calc non Af Amer: >60 mL/min (ref >60)
Glucose, Bld: 108 mg/dL — ABNORMAL HIGH (ref 65–99)
Potassium: 3.4 mmol/L — ABNORMAL LOW (ref 3.5–5.1)
Sodium: 134 mmol/L — ABNORMAL LOW (ref 135–145)

## 2015-12-06 LAB — MAGNESIUM: Magnesium: 1.6 mg/dL — ABNORMAL LOW (ref 1.7–2.4)

## 2015-12-06 MED ORDER — LORAZEPAM 2 MG/ML IJ SOLN
1.0000 mg | Freq: Once | INTRAMUSCULAR | Status: AC
  Administered 2015-12-06: 1 mg via INTRAVENOUS

## 2015-12-06 MED ORDER — AMOXICILLIN-POT CLAVULANATE 875-125 MG PO TABS
1.0000 | ORAL_TABLET | Freq: Two times a day (BID) | ORAL | Status: DC
  Administered 2015-12-06: 1 via ORAL
  Filled 2015-12-06: qty 1

## 2015-12-06 MED ORDER — MAGNESIUM OXIDE 400 (241.3 MG) MG PO TABS
400.0000 mg | ORAL_TABLET | Freq: Two times a day (BID) | ORAL | Status: DC
  Administered 2015-12-06: 400 mg via ORAL
  Filled 2015-12-06: qty 1

## 2015-12-06 MED ORDER — SODIUM CHLORIDE 0.9 % IV SOLN: INTRAVENOUS | Status: DC

## 2015-12-06 MED ORDER — AMOXICILLIN-POT CLAVULANATE 875-125 MG PO TABS: 1.0000 | ORAL_TABLET | Freq: Two times a day (BID) | ORAL | 0 refills | Status: AC

## 2015-12-06 MED ORDER — POTASSIUM CHLORIDE CRYS ER 20 MEQ PO TBCR
40.0000 meq | EXTENDED_RELEASE_TABLET | Freq: Two times a day (BID) | ORAL | Status: DC
  Administered 2015-12-06: 40 meq via ORAL
  Filled 2015-12-06: qty 2

## 2015-12-06 NOTE — Progress Notes (Signed)
Patient agitated and wants to leave and go home. Per MD Dr. Maylene Roes, patient is signing out Duane Richardson. Patient received Ativan 1 mg IV. Dr. Maylene Roes aware. Patient signed AMA paper and taking cab home.  [name]

## 2015-12-06 NOTE — Progress Notes (Signed)
Patient left floor AMA, IV and telemetry were d/c'd, patient confirmed he had belongings and volunteer took him downstairs via wheelchair

## 2015-12-06 NOTE — Discharge Summary (Signed)
Physician Discharge Summary  Duane Richardson MEQ:[id number] DOB: 16-Mar-1955 DOA: 12/04/2015  PCP: Duane Fraction, MD  Admit date: 12/04/2015 Discharge date: 12/06/2015  Admitted From: Home Disposition:  Left AMA to home   [name] a 60 y.o.malewith history of HTN, depression, and alcohol abuse was brought to the ER after patient states he had a syncopal episode while visiting his opthamologist for a right eye problem. Patient is mildly somnolent after receiving librium and ativan for alcohol withdrawal. Patient is not able to clearly explain the circumstance of his syncopal episode. Patient denies any tongue bite or incontinence of urine. Feels he has been weak the last few days and has been unable to keep anything down due to nausea, vomiting, and epigastric abdominal pain for the last three days. He also was not able to take his regular alcohol intake. Patient also complains of vague chest pain which he is unable to, again, characterize. Patient is admitted for generalized weakness, nausea, vomiting, abdominal pain, and possible syncopal episode, alcohol withdrawal. CT of abdomen and pelvis show features concerning for ascending and sigmoid colitis and possible aspiration. Repeat chest x-ray did not reveal any aspiration or consolidation. For his colitis, stool studies were obtained which were negative. He was initially started on Zosyn, which was transitioned to oral Augmentin. However, patient left AGAINST MEDICAL ADVICE prior to discharge. I did send in a prescription for Augmentin to finish a total 7 more day course to his pharmacy.  Unfortunately, patient is at higher risk of re-admission as well as decompensation. He was orthostatic positive prior to discharge. Physical therapy did have a chance to evaluate him inpatient and recommended home health physical therapy, which was not set up prior to him leaving [place]. His electrolytes were replaced in the morning, however,  this should be repeated as well as repeat CBC and LFT. Patient should also follow-up with PCP and possible referral to GI for a future colonoscopy once acute illness resolves.  Discharge Diagnoses:  Principal Problem:   Generalized weakness Active Problems:   Leucopenia   ETOH abuse   HTN (hypertension)   Alcohol withdrawal (HCC)   Nausea & vomiting   Acute colitis   Syncope   Hypokalemia   Hypomagnesemia   Thrombocytopenia (HCC)  Discharge Instructions  No Known Allergies  Consultations:  None    Procedures/Studies: Ct Head Wo Contrast  Result Date: 12/04/2015 CLINICAL DATA:  Initial evaluation for weakness for several days. EXAM: CT HEAD WITHOUT CONTRAST TECHNIQUE: Contiguous axial images were obtained from the base of the skull through the vertex without intravenous contrast. COMPARISON:  Prior CT from 07/09/2015. FINDINGS: Brain: Generalized cerebral atrophy with mild chronic microvascular ischemic disease. No acute intracranial hemorrhage. No evidence for acute large vessel territory infarct. No mass lesion, midline shift, or mass effect. No hydrocephalus. No extra-axial fluid collection. Vascular: No hyperdense vessel. Scattered vascular calcifications noted within the carotid siphons. Skull: Scalp soft tissues within normal limits. The calvarium intact. Sinuses/Orbits: Globes and orbits within normal limits. Visualized paranasal sinuses are clear. No mastoid effusion. IMPRESSION: 1. No acute intracranial process identified. 2. Mild atrophy with chronic microvascular ischemic disease. Electronically Signed   By: Jeannine Boga M.D.   On: 12/04/2015 23:52   Ct Abdomen Pelvis W Contrast  Result Date: 12/04/2015 CLINICAL DATA:  Epigastric and left upper quadrant pain. Generalized weakness. Nausea and vomiting. EXAM: CT ABDOMEN AND PELVIS WITH CONTRAST TECHNIQUE: Multidetector CT imaging of the abdomen and pelvis was performed using the standard  protocol following bolus  administration of intravenous contrast. CONTRAST:  100 cc Isovue-300 IV COMPARISON:  CT 03/05/2012 FINDINGS: Lower chest: Peripheral linear and patchy opacity in the medial lung bases, right greater than left. No pleural fluid. Normal heart size. Hepatobiliary: Decreased hepatic density. No focal hepatic lesion. Gallbladder physiologically distended, no calcified stone. No biliary dilatation. Pancreas: No ductal dilatation or inflammation. Spleen: Normal in size without focal abnormality. Adrenals/Urinary Tract: Nonobstructing left nephrolithiasis, largest stone in the lower pole measuring 7 mm. No hydronephrosis. No ureteral stone. Homogeneous symmetric renal enhancement and excretion. The adrenal glands are normal. The bladder is physiologically distended. Stomach/Bowel: Small hiatal hernia. Fluid within the stomach which is not abnormally distended. Small bowel is decompressed. Liquid and solid stool throughout the colon. Equivocal colonic wall thickening involving the ascending and sigmoid colon. No definite descending colonic wall thickening. The appendix is normal. Vascular/Lymphatic: Aortic atherosclerosis. No aneurysm. Accessory left renal artery. No enlarged abdominal or pelvic lymph nodes. Reproductive: Heterogeneously enlarged prostate gland spanning 5.2 cm transverse. Other: No abdominal wall hernia or abnormality. No abdominopelvic ascites. Musculoskeletal: Left hip arthroplasty with heterotopic calcification. Scattered degenerative change throughout the lumbar spine. Minimal compression deformity of L1 is new from 2014 but appears remote. Bone island within L5, stable. IMPRESSION: 1. Equivocal colonic wall thickening involving the ascending and sigmoid colon, with scattered liquid stool. Findings can be seen with colitis. No left upper quadrant bowel inflammation. 2. Nonobstructing left nephrolithiasis. 3. Linear and patchy opacities at the lung bases, right greater than left, likely atelectasis,  recommend correlation for any suspicion of aspiration. 4. Hepatic steatosis. 5. Abdominal aortic atherosclerosis, no aneurysm. Electronically Signed   By: Jeb Levering M.D.   On: 12/04/2015 22:00   Dg Chest Port 1 View  Result Date: 12/05/2015 CLINICAL DATA:  Initial evaluation for generalized weakness. EXAM: PORTABLE CHEST 1 VIEW COMPARISON:  Prior radiograph from 12/06/2014. FINDINGS: Cardiac and mediastinal silhouettes are within normal limits. Mild aortic atherosclerosis noted. The lungs are normally inflated. No airspace consolidation, pleural effusion, or pulmonary edema is identified. There is no pneumothorax. No acute osseous abnormality identified. IMPRESSION: 1. No active cardiopulmonary disease. 2. Mild aortic atherosclerosis. Electronically Signed   By: Jeannine Boga M.D.   On: 12/05/2015 00:32    Subjective: No complaints, wants to go home.   Discharge Exam: Vitals:   12/06/15 0517 12/06/15 1000  BP: 126/87 124/82  Pulse: 77 75  Resp: 16 17  Temp: 97.9 F (36.6 C) 97.3 F (36.3 C)   Vitals:   12/05/15 1734 12/05/15 2206 12/06/15 0517 12/06/15 1000  BP: 118/76 121/80 126/87 124/82  Pulse: 80 77 77 75  Resp: '20 18 16 17  '$ Temp: 98.2 F (36.8 C) 98.6 F (37 C) 97.9 F (36.6 C) 97.3 F (36.3 C)  TempSrc: Oral Oral Oral Oral  SpO2: 98% 97% 98% 98%  Weight:      Height:       General exam: Appears calm although very frustrated and wants to go home Respiratory system: Clear to auscultation. Respiratory effort normal. Cardiovascular system: S1 & S2 heard, RRR. No JVD, murmurs, rubs, gallops or clicks. No pedal edema. Gastrointestinal system: Abdomen is nondistended, soft and nontender. No organomegaly or masses felt. Normal bowel sounds heard. Central nervous system: Alert and oriented. No focal neurological deficits. Extremities: Symmetric 5 x 5 power. Skin: No rashes, lesions or ulcers   The results of significant diagnostics from this hospitalization  (including imaging, microbiology, ancillary and laboratory) are listed below for  reference.     Microbiology: Recent Results (from the past 240 hour(s))  Gastrointestinal Panel by PCR , Stool     Status: None   Collection Time: 12/05/15 12:04 PM  Result Value Ref Range Status   Campylobacter species NOT DETECTED NOT DETECTED Final   Plesimonas shigelloides NOT DETECTED NOT DETECTED Final   Salmonella species NOT DETECTED NOT DETECTED Final   Yersinia enterocolitica NOT DETECTED NOT DETECTED Final   Vibrio species NOT DETECTED NOT DETECTED Final   Vibrio cholerae NOT DETECTED NOT DETECTED Final   Enteroaggregative E coli (EAEC) NOT DETECTED NOT DETECTED Final   Enteropathogenic E coli (EPEC) NOT DETECTED NOT DETECTED Final   Enterotoxigenic E coli (ETEC) NOT DETECTED NOT DETECTED Final   Shiga like toxin producing E coli (STEC) NOT DETECTED NOT DETECTED Final   Shigella/Enteroinvasive E coli (EIEC) NOT DETECTED NOT DETECTED Final   Cryptosporidium NOT DETECTED NOT DETECTED Final   Cyclospora cayetanensis NOT DETECTED NOT DETECTED Final   Entamoeba histolytica NOT DETECTED NOT DETECTED Final   Giardia lamblia NOT DETECTED NOT DETECTED Final   Adenovirus F40/41 NOT DETECTED NOT DETECTED Final   Astrovirus NOT DETECTED NOT DETECTED Final   Norovirus GI/GII NOT DETECTED NOT DETECTED Final   Rotavirus A NOT DETECTED NOT DETECTED Final   Sapovirus (I, II, IV, and V) NOT DETECTED NOT DETECTED Final     Labs: BNP (last 3 results) No results for input(s): BNP in the last 8760 hours. Basic Metabolic Panel:  Recent Labs Lab 12/04/15 1227 12/05/15 0148 12/05/15 0528 12/06/15 0459  NA 139  --  137 134*  K 4.2  --  3.3* 3.4*  CL 107  --  108 106  CO2 21*  --  19* 22  GLUCOSE 110*  --  164* 108*  BUN 5*  --  <5* <5*  CREATININE 0.83  --  0.70 0.71  CALCIUM 8.4*  --  7.5* 7.6*  MG  --  1.6*  --  1.6*   Liver Function Tests:  Recent Labs Lab 12/04/15 1227 12/05/15 0528  AST  166* 238*  ALT 37 43  ALKPHOS 97 84  BILITOT 0.8 0.6  PROT 6.6 5.3*  ALBUMIN 3.3* 2.6*    Recent Labs Lab 12/04/15 1227  LIPASE 24   No results for input(s): AMMONIA in the last 168 hours. CBC:  Recent Labs Lab 12/04/15 1227 12/05/15 0528 12/06/15 0459  WBC 1.9* 2.1* 2.0*  NEUTROABS  --   --  0.6*  HGB 14.7 13.6 13.9  HCT 43.1 39.7 40.5  MCV 94.7 94.1 93.8  PLT 94* 85* 79*   Cardiac Enzymes:  Recent Labs Lab 12/04/15 2343 12/05/15 0528  CKTOTAL 143  --   TROPONINI <0.03 <0.03   BNP: Invalid input(s): POCBNP CBG: No results for input(s): GLUCAP in the last 168 hours. D-Dimer No results for input(s): DDIMER in the last 72 hours. Hgb A1c No results for input(s): HGBA1C in the last 72 hours. Lipid Profile No results for input(s): CHOL, HDL, LDLCALC, TRIG, CHOLHDL, LDLDIRECT in the last 72 hours. Thyroid function studies  Recent Labs  12/04/15 2343  TSH 0.917   Anemia work up No results for input(s): VITAMINB12, FOLATE, FERRITIN, TIBC, IRON, RETICCTPCT in the last 72 hours. Urinalysis    Component Value Date/Time   COLORURINE AMBER (A) 12/04/2015 1946   APPEARANCEUR CLEAR 12/04/2015 1946   LABSPEC 1.015 12/04/2015 1946   PHURINE 7.0 12/04/2015 1946   GLUCOSEU NEGATIVE 12/04/2015 1946   HGBUR  NEGATIVE 12/04/2015 1946   BILIRUBINUR NEGATIVE 12/04/2015 1946   KETONESUR 15 (A) 12/04/2015 1946   PROTEINUR NEGATIVE 12/04/2015 1946   UROBILINOGEN 1.0 06/03/2013 1006   NITRITE NEGATIVE 12/04/2015 1946   LEUKOCYTESUR NEGATIVE 12/04/2015 1946   Sepsis Labs Invalid input(s): PROCALCITONIN,  WBC,  LACTICIDVEN Microbiology Recent Results (from the past 240 hour(s))  Gastrointestinal Panel by PCR , Stool     Status: None   Collection Time: 12/05/15 12:04 PM  Result Value Ref Range Status   Campylobacter species NOT DETECTED NOT DETECTED Final   Plesimonas shigelloides NOT DETECTED NOT DETECTED Final   Salmonella species NOT DETECTED NOT DETECTED Final    Yersinia enterocolitica NOT DETECTED NOT DETECTED Final   Vibrio species NOT DETECTED NOT DETECTED Final   Vibrio cholerae NOT DETECTED NOT DETECTED Final   Enteroaggregative E coli (EAEC) NOT DETECTED NOT DETECTED Final   Enteropathogenic E coli (EPEC) NOT DETECTED NOT DETECTED Final   Enterotoxigenic E coli (ETEC) NOT DETECTED NOT DETECTED Final   Shiga like toxin producing E coli (STEC) NOT DETECTED NOT DETECTED Final   Shigella/Enteroinvasive E coli (EIEC) NOT DETECTED NOT DETECTED Final   Cryptosporidium NOT DETECTED NOT DETECTED Final   Cyclospora cayetanensis NOT DETECTED NOT DETECTED Final   Entamoeba histolytica NOT DETECTED NOT DETECTED Final   Giardia lamblia NOT DETECTED NOT DETECTED Final   Adenovirus F40/41 NOT DETECTED NOT DETECTED Final   Astrovirus NOT DETECTED NOT DETECTED Final   Norovirus GI/GII NOT DETECTED NOT DETECTED Final   Rotavirus A NOT DETECTED NOT DETECTED Final   Sapovirus (I, II, IV, and V) NOT DETECTED NOT DETECTED Final     Time coordinating discharge: Over 30 minutes  SIGNED:  Dessa Phi, DO [facility] Pager [phone]  If 7PM-7AM, please contact night-coverage [email or web address] Password [email or web address] 12/06/2015, 3:52 PM

## 2015-12-06 NOTE — Progress Notes (Signed)
PROGRESS NOTE    Duane Richardson  UMP:[id number] DOB: Jun 24, 1955 DOA: 12/04/2015 PCP: Odette Fraction, MD     Brief Narrative:  Duane Richardson is a 60 y.o. male with history of HTN, depression, and alcohol abuse was brought to the ER after patient states he had a syncopal episode while visiting his opthamologist for a right eye problem. Patient is mildly somnolent after receiving librium and ativan for alcohol withdrawal. Patient is not able to clearly explain the circumstance of his syncopal episode. Patient denies any tongue bite or incontinence of urine. Feels he has been weak the last few days and has been unable to keep anything down due to nausea, vomiting, and epigastric abdominal pain for the last three days. He also was not able to take his regular alcohol intake. Patient also complains of vague chest pain which he is unable to, again, characterize. Patient is admitted for generalized weakness, nausea, vomiting, abdominal pain, and possible syncopal episode, alcohol withdrawal. CT of abdomen and pelvis show features concerning for ascending and sigmoid colitis and possible aspiration.   Assessment & Plan:   Principal Problem:   Generalized weakness Active Problems:   Leucopenia   ETOH abuse   HTN (hypertension)   Alcohol withdrawal (HCC)   Nausea & vomiting   Acute colitis   Syncope   Hypokalemia   Hypomagnesemia   Thrombocytopenia (HCC)  Generalized weakness -Possibly secondary to deconditioning and dehydration from nausea, vomiting, diarrhea, and alcohol abuse -IVF -PT eval, recommending home health PT   Ascending and sigmoid colon colitis  -Lactic acid normal -Empiric zosyn, switched to Augmentin today  -Stool studies pending   Syncope -Patient states he passed out. Circumstances are not clear -EKG does show borderline prolonged QT. Will closely monitor in telemetry for arrhythmias -Obtain orthostatic Vs, 152/94 --> 128/89   Hypokalemia -Due to diarrhea, GI  loss -Replace  Hypomagnesemia -Replace   Alochol abuse with withdrawal  -Patient was unable to eat anything for the last three days and has also not taken his alcohol intake. CIWA protocol  -Folic acid, thiamine daily   Leukopenia and thrombocytopenia  -Probably secondary to alcohol induced bone marrow suppression -Will check HIV - negative  -Closely follow CBC with differential   Mildly elevated AST  -Probably secondary to alcohol. Will check acute hepatitis panel - negative   Hypertension  -PRN IV hydralazine along with home dose of amlodipine  Depression  -Continue zoloft  Possible aspiration  -Chest xray negative for active cardiopulm disease   DVT prophylaxis: SCDs Code Status: Full Family Communication: no family at bedside. Patient refused for me to call and update any family members  Disposition Plan: Patient adamant that he is leaving [place]. I did discuss with him the risks of leaving [place] including worsening colitis, death. I recommended that he stay to monitor electrolyte abnormalities, improvement of his oral intake as well as monitor bowel movements, and set up home health PT. Patient refused all of the above. Prior to leaving, patient was quite agitated with tremors due to alcohol withdrawal. He did receive Ativan prior to discharge. He refused for me to call any family members prior to discharge. He is alert, oriented to self, place, time and seems competent to make own decisions.   Consultants:   None  Procedures:   None  Antimicrobials:   Zosyn 10/10 >>     Subjective: No complaints, wants to go home.   Objective: Vitals:   12/05/15 1734 12/05/15 2206  12/06/15 0517 12/06/15 1000  BP: 118/76 121/80 126/87 124/82  Pulse: 80 77 77 75  Resp: '20 18 16 17  '$ Temp: 98.2 F (36.8 C) 98.6 F (37 C) 97.9 F (36.6 C) 97.3 F (36.3 C)  TempSrc: Oral Oral Oral Oral  SpO2: 98% 97% 98% 98%  Weight:      Height:         Intake/Output Summary (Last 24 hours) at 12/06/15 1420 Last data filed at 12/06/15 1044  Gross per 24 hour  Intake              480 ml  Output              830 ml  Net             -350 ml   Filed Weights   12/05/15 0045  Weight: 68.3 kg (150 lb 8 oz)    Examination:  General exam: Appears calm although very frustrated and wants to go home Respiratory system: Clear to auscultation. Respiratory effort normal. Cardiovascular system: S1 & S2 heard, RRR. No JVD, murmurs, rubs, gallops or clicks. No pedal edema. Gastrointestinal system: Abdomen is nondistended, soft and nontender. No organomegaly or masses felt. Normal bowel sounds heard. Central nervous system: Alert and oriented. No focal neurological deficits. Extremities: Symmetric 5 x 5 power. Skin: No rashes, lesions or ulcers  Data Reviewed: I have personally reviewed following labs and imaging studies  CBC:  Recent Labs Lab 12/04/15 1227 12/05/15 0528 12/06/15 0459  WBC 1.9* 2.1* 2.0*  NEUTROABS  --   --  0.6*  HGB 14.7 13.6 13.9  HCT 43.1 39.7 40.5  MCV 94.7 94.1 93.8  PLT 94* 85* 79*   Basic Metabolic Panel:  Recent Labs Lab 12/04/15 1227 12/05/15 0148 12/05/15 0528 12/06/15 0459  NA 139  --  137 134*  K 4.2  --  3.3* 3.4*  CL 107  --  108 106  CO2 21*  --  19* 22  GLUCOSE 110*  --  164* 108*  BUN 5*  --  <5* <5*  CREATININE 0.83  --  0.70 0.71  CALCIUM 8.4*  --  7.5* 7.6*  MG  --  1.6*  --  1.6*   GFR: Estimated Creatinine Clearance: 92.5 mL/min (by C-G formula based on SCr of 0.71 mg/dL). Liver Function Tests:  Recent Labs Lab 12/04/15 1227 12/05/15 0528  AST 166* 238*  ALT 37 43  ALKPHOS 97 84  BILITOT 0.8 0.6  PROT 6.6 5.3*  ALBUMIN 3.3* 2.6*    Recent Labs Lab 12/04/15 1227  LIPASE 24   No results for input(s): AMMONIA in the last 168 hours. Coagulation Profile: No results for input(s): INR, PROTIME in the last 168 hours. Cardiac Enzymes:  Recent Labs Lab 12/04/15 2343  12/05/15 0528  CKTOTAL 143  --   TROPONINI <0.03 <0.03   BNP (last 3 results) No results for input(s): PROBNP in the last 8760 hours. HbA1C: No results for input(s): HGBA1C in the last 72 hours. CBG: No results for input(s): GLUCAP in the last 168 hours. Lipid Profile: No results for input(s): CHOL, HDL, LDLCALC, TRIG, CHOLHDL, LDLDIRECT in the last 72 hours. Thyroid Function Tests:  Recent Labs  12/04/15 2343  TSH 0.917   Anemia Panel: No results for input(s): VITAMINB12, FOLATE, FERRITIN, TIBC, IRON, RETICCTPCT in the last 72 hours. Sepsis Labs:  Recent Labs Lab 12/04/15 2301 12/04/15 2343  LATICACIDVEN 1.04 1.3    No results found for this  or any previous visit (from the past 240 hour(s)).     Radiology Studies: Ct Head Wo Contrast  Result Date: 12/04/2015 CLINICAL DATA:  Initial evaluation for weakness for several days. EXAM: CT HEAD WITHOUT CONTRAST TECHNIQUE: Contiguous axial images were obtained from the base of the skull through the vertex without intravenous contrast. COMPARISON:  Prior CT from 07/09/2015. FINDINGS: Brain: Generalized cerebral atrophy with mild chronic microvascular ischemic disease. No acute intracranial hemorrhage. No evidence for acute large vessel territory infarct. No mass lesion, midline shift, or mass effect. No hydrocephalus. No extra-axial fluid collection. Vascular: No hyperdense vessel. Scattered vascular calcifications noted within the carotid siphons. Skull: Scalp soft tissues within normal limits. The calvarium intact. Sinuses/Orbits: Globes and orbits within normal limits. Visualized paranasal sinuses are clear. No mastoid effusion. IMPRESSION: 1. No acute intracranial process identified. 2. Mild atrophy with chronic microvascular ischemic disease. Electronically Signed   By: Jeannine Boga M.D.   On: 12/04/2015 23:52   Ct Abdomen Pelvis W Contrast  Result Date: 12/04/2015 CLINICAL DATA:  Epigastric and left upper quadrant  pain. Generalized weakness. Nausea and vomiting. EXAM: CT ABDOMEN AND PELVIS WITH CONTRAST TECHNIQUE: Multidetector CT imaging of the abdomen and pelvis was performed using the standard protocol following bolus administration of intravenous contrast. CONTRAST:  100 cc Isovue-300 IV COMPARISON:  CT 03/05/2012 FINDINGS: Lower chest: Peripheral linear and patchy opacity in the medial lung bases, right greater than left. No pleural fluid. Normal heart size. Hepatobiliary: Decreased hepatic density. No focal hepatic lesion. Gallbladder physiologically distended, no calcified stone. No biliary dilatation. Pancreas: No ductal dilatation or inflammation. Spleen: Normal in size without focal abnormality. Adrenals/Urinary Tract: Nonobstructing left nephrolithiasis, largest stone in the lower pole measuring 7 mm. No hydronephrosis. No ureteral stone. Homogeneous symmetric renal enhancement and excretion. The adrenal glands are normal. The bladder is physiologically distended. Stomach/Bowel: Small hiatal hernia. Fluid within the stomach which is not abnormally distended. Small bowel is decompressed. Liquid and solid stool throughout the colon. Equivocal colonic wall thickening involving the ascending and sigmoid colon. No definite descending colonic wall thickening. The appendix is normal. Vascular/Lymphatic: Aortic atherosclerosis. No aneurysm. Accessory left renal artery. No enlarged abdominal or pelvic lymph nodes. Reproductive: Heterogeneously enlarged prostate gland spanning 5.2 cm transverse. Other: No abdominal wall hernia or abnormality. No abdominopelvic ascites. Musculoskeletal: Left hip arthroplasty with heterotopic calcification. Scattered degenerative change throughout the lumbar spine. Minimal compression deformity of L1 is new from 2014 but appears remote. Bone island within L5, stable. IMPRESSION: 1. Equivocal colonic wall thickening involving the ascending and sigmoid colon, with scattered liquid stool.  Findings can be seen with colitis. No left upper quadrant bowel inflammation. 2. Nonobstructing left nephrolithiasis. 3. Linear and patchy opacities at the lung bases, right greater than left, likely atelectasis, recommend correlation for any suspicion of aspiration. 4. Hepatic steatosis. 5. Abdominal aortic atherosclerosis, no aneurysm. Electronically Signed   By: Jeb Levering M.D.   On: 12/04/2015 22:00   Dg Chest Port 1 View  Result Date: 12/05/2015 CLINICAL DATA:  Initial evaluation for generalized weakness. EXAM: PORTABLE CHEST 1 VIEW COMPARISON:  Prior radiograph from 12/06/2014. FINDINGS: Cardiac and mediastinal silhouettes are within normal limits. Mild aortic atherosclerosis noted. The lungs are normally inflated. No airspace consolidation, pleural effusion, or pulmonary edema is identified. There is no pneumothorax. No acute osseous abnormality identified. IMPRESSION: 1. No active cardiopulmonary disease. 2. Mild aortic atherosclerosis. Electronically Signed   By: Jeannine Boga M.D.   On: 12/05/2015 00:32  Scheduled Meds: . amLODipine  10 mg Oral Daily  . amoxicillin-clavulanate  1 tablet Oral Q12H  . feeding supplement (ENSURE ENLIVE)  237 mL Oral BID BM  . folic acid  1 mg Oral Daily  . Influenza vac split quadrivalent PF  0.5 mL Intramuscular Tomorrow-1000  . LORazepam  0-4 mg Intravenous Q6H   Followed by  . [START ON 12/07/2015] LORazepam  0-4 mg Intravenous Q12H  . magnesium oxide  400 mg Oral BID  . multivitamin with minerals  1 tablet Oral Daily  . potassium chloride  40 mEq Oral BID  . sertraline  50 mg Oral Daily  . thiamine  100 mg Oral Daily   Or  . thiamine  100 mg Intravenous Daily   Continuous Infusions: . sodium chloride       LOS: 2 days    Time spent: 40 minutes    Dessa Phi, DO [facility] Pager [phone]  If 7PM-7AM, please contact night-coverage [email or web address] Password [email or web address] 12/06/2015, 2:20 PM

## 2015-12-07 LAB — PATHOLOGIST SMEAR REVIEW

## 2016-01-01 ENCOUNTER — Other Ambulatory Visit: Payer: Self-pay | Admitting: Family Medicine

## 2016-01-01 ENCOUNTER — Other Ambulatory Visit: Payer: Self-pay | Admitting: Physician Assistant

## 2016-01-02 NOTE — Telephone Encounter (Signed)
Medication refilled per protocol. 

## 2016-02-20 ENCOUNTER — Telehealth (INDEPENDENT_AMBULATORY_CARE_PROVIDER_SITE_OTHER): Payer: Self-pay | Admitting: Orthopaedic Surgery

## 2016-02-20 NOTE — Telephone Encounter (Signed)
[employer] faxed over a form from medicaid and she wanted to let you know she is sending it back over for completion as something was missing. Duane Richardson [phone] order number [id number]

## 2016-02-22 NOTE — Telephone Encounter (Signed)
[employer] called stating form was only signed but not complete. They refaxed this, every line has to be filled out

## 2016-03-04 ENCOUNTER — Ambulatory Visit (INDEPENDENT_AMBULATORY_CARE_PROVIDER_SITE_OTHER): Payer: Medicaid Other | Admitting: Orthopaedic Surgery

## 2016-03-04 ENCOUNTER — Telehealth (INDEPENDENT_AMBULATORY_CARE_PROVIDER_SITE_OTHER): Payer: Self-pay | Admitting: Orthopaedic Surgery

## 2016-03-04 DIAGNOSIS — R531 Weakness: Secondary | ICD-10-CM

## 2016-03-04 DIAGNOSIS — Z96642 Presence of left artificial hip joint: Secondary | ICD-10-CM

## 2016-03-04 NOTE — Progress Notes (Signed)
The patient is basically coming in today to make sure that we have filled out paperwork as a relates to repair of his Hoveround/motility assisted device. He is someone who is considerable fall risk. He and his is a cane. He's had generalized weakness and malnutrition as relates to past alcohol abuse. He is actually hospitalized this past October for similar symptoms and left AGAINST MEDICAL ADVICE. He still in place with a cane. He also uses the Hoveround but it is in need of repair have the paperwork for this which I have filled out we will fax him.  On examination he is cachectic appearing. He is unsteady in his gait. He has a benign essential tremor. His left hip exam is stable from his hip surgery in 2015. He has generalized lower extremity weakness on my exam as well.  Given his significant fall risk and his history of alcohol abuse although he says he is not abusing any subsidence now, he is considerable fall risk still. I recommended he see his primary care physician soon for close follow-up of his electrolytes. I counseled him about this extensively. He'll follow up as needed.

## 2016-03-17 NOTE — Telephone Encounter (Signed)
[employer] called for 3 items that needed to be completed on pt form for things he needs, she stated she faxed this on the 16th and if we could fax back.  [place]

## 2016-03-19 NOTE — Telephone Encounter (Signed)
This was corrected and faxed by tammy today

## 2016-03-27 ENCOUNTER — Telehealth (INDEPENDENT_AMBULATORY_CARE_PROVIDER_SITE_OTHER): Payer: Self-pay | Admitting: Orthopaedic Surgery

## 2016-03-27 NOTE — Telephone Encounter (Signed)
Did you fax anything today? I didn't

## 2016-03-27 NOTE — Telephone Encounter (Signed)
Debra from [facility] called and stated that they received a fax from [place] today for patient Duane Richardson, but it was only their fax cover sheet and a blank page.  Their fax # is [phone].  Her Cb#[phone].

## 2016-04-11 ENCOUNTER — Telehealth: Payer: Self-pay | Admitting: Family Medicine

## 2016-04-11 MED ORDER — OSELTAMIVIR PHOSPHATE 75 MG PO CAPS: 75.0000 mg | ORAL_CAPSULE | Freq: Two times a day (BID) | ORAL | 0 refills | Status: DC

## 2016-04-11 NOTE — Telephone Encounter (Signed)
Tried to call no answer and no vm will send tamiflu to pharm on file and need to know where to send rx for the grabber.

## 2016-04-11 NOTE — Telephone Encounter (Signed)
Spoke to [place] and she will p/u rx on Monday - rx written and left up front.

## 2016-04-11 NOTE — Telephone Encounter (Signed)
Duane Richardson is Duane Richardson's [employer] and wants to know if they can get a script for a grabber so that he doesn't have to bend over to pick up things. Also he is having sx of the flu such as fever, body aches, and chills, wants to know if tamiflu can be called in or if he needs an appointment, or if he should just go to er or urgent care.

## 2016-04-28 ENCOUNTER — Emergency Department (HOSPITAL_COMMUNITY)
Admission: EM | Admit: 2016-04-28 | Discharge: 2016-04-28 | Disposition: A | Discharge status: Home / Self Care | Payer: Medicaid Other | Attending: Emergency Medicine | Admitting: Emergency Medicine

## 2016-04-28 ENCOUNTER — Encounter (HOSPITAL_COMMUNITY): Payer: Self-pay | Admitting: Emergency Medicine

## 2016-04-28 ENCOUNTER — Emergency Department (HOSPITAL_COMMUNITY): Payer: Medicaid Other

## 2016-04-28 DIAGNOSIS — S2231XA Fracture of one rib, right side, initial encounter for closed fracture: Secondary | ICD-10-CM | POA: Diagnosis not present

## 2016-04-28 DIAGNOSIS — F17210 Nicotine dependence, cigarettes, uncomplicated: Secondary | ICD-10-CM | POA: Insufficient documentation

## 2016-04-28 DIAGNOSIS — J449 Chronic obstructive pulmonary disease, unspecified: Secondary | ICD-10-CM | POA: Insufficient documentation

## 2016-04-28 DIAGNOSIS — Z79899 Other long term (current) drug therapy: Secondary | ICD-10-CM | POA: Insufficient documentation

## 2016-04-28 DIAGNOSIS — Z7982 Long term (current) use of aspirin: Secondary | ICD-10-CM | POA: Diagnosis not present

## 2016-04-28 DIAGNOSIS — Y999 Unspecified external cause status: Secondary | ICD-10-CM | POA: Diagnosis not present

## 2016-04-28 DIAGNOSIS — W19XXXA Unspecified fall, initial encounter: Secondary | ICD-10-CM | POA: Insufficient documentation

## 2016-04-28 DIAGNOSIS — Y929 Unspecified place or not applicable: Secondary | ICD-10-CM | POA: Insufficient documentation

## 2016-04-28 DIAGNOSIS — I10 Essential (primary) hypertension: Secondary | ICD-10-CM | POA: Insufficient documentation

## 2016-04-28 DIAGNOSIS — Y9301 Activity, walking, marching and hiking: Secondary | ICD-10-CM | POA: Diagnosis not present

## 2016-04-28 DIAGNOSIS — S299XXA Unspecified injury of thorax, initial encounter: Secondary | ICD-10-CM | POA: Diagnosis present

## 2016-04-28 DIAGNOSIS — J45909 Unspecified asthma, uncomplicated: Secondary | ICD-10-CM | POA: Insufficient documentation

## 2016-04-28 LAB — COMPREHENSIVE METABOLIC PANEL
ALT: 15 U/L — ABNORMAL LOW (ref 17–63)
AST: 44 U/L — ABNORMAL HIGH (ref 15–41)
Albumin: 3.3 g/dL — ABNORMAL LOW (ref 3.5–5.0)
Alkaline Phosphatase: 110 U/L (ref 38–126)
Anion gap: 8 (ref 5–15)
BUN: 9 mg/dL (ref 6–20)
CO2: 25 mmol/L (ref 22–32)
Calcium: 8.5 mg/dL — ABNORMAL LOW (ref 8.9–10.3)
Chloride: 104 mmol/L (ref 101–111)
Creatinine, Ser: 0.75 mg/dL (ref 0.61–1.24)
GFR calc Af Amer: >60 mL/min (ref >60)
GFR calc non Af Amer: >60 mL/min (ref >60)
Glucose, Bld: 124 mg/dL — ABNORMAL HIGH (ref 65–99)
Potassium: 3.6 mmol/L (ref 3.5–5.1)
Sodium: 137 mmol/L (ref 135–145)
Total Bilirubin: 1.1 mg/dL (ref 0.3–1.2)
Total Protein: 7 g/dL (ref 6.5–8.1)

## 2016-04-28 LAB — RAPID URINE DRUG SCREEN, HOSP PERFORMED
Amphetamines: NOT DETECTED
Barbiturates: NOT DETECTED
Benzodiazepines: NOT DETECTED
Cocaine: NOT DETECTED
Opiates: NOT DETECTED
Tetrahydrocannabinol: NOT DETECTED

## 2016-04-28 LAB — CBC WITH DIFFERENTIAL/PLATELET
Basophils Absolute: 0 10*3/uL (ref 0.0–0.1)
Basophils Relative: 1 %
Eosinophils Absolute: 0 10*3/uL (ref 0.0–0.7)
Eosinophils Relative: 0 %
HCT: 42.8 % (ref 39.0–52.0)
Hemoglobin: 14.6 g/dL (ref 13.0–17.0)
Lymphocytes Relative: 25 %
Lymphs Abs: 1.1 10*3/uL (ref 0.7–4.0)
MCH: 31.6 pg (ref 26.0–34.0)
MCHC: 34.1 g/dL (ref 30.0–36.0)
MCV: 92.6 fL (ref 78.0–100.0)
Monocytes Absolute: 1 10*3/uL (ref 0.1–1.0)
Monocytes Relative: 23 %
Neutro Abs: 2.4 10*3/uL (ref 1.7–7.7)
Neutrophils Relative %: 51 %
Platelets: 124 10*3/uL — ABNORMAL LOW (ref 150–400)
RBC: 4.62 MIL/uL (ref 4.22–5.81)
RDW: 13.5 % (ref 11.5–15.5)
WBC: 4.6 10*3/uL (ref 4.0–10.5)

## 2016-04-28 LAB — ETHANOL: Alcohol, Ethyl (B): <5 mg/dL (ref <5)

## 2016-04-28 MED ORDER — AMLODIPINE BESYLATE 5 MG PO TABS
10.0000 mg | ORAL_TABLET | Freq: Once | ORAL | Status: AC
  Administered 2016-04-28: 10 mg via ORAL
  Filled 2016-04-28: qty 2

## 2016-04-28 MED ORDER — LORAZEPAM 1 MG PO TABS
1.0000 mg | ORAL_TABLET | Freq: Once | ORAL | Status: AC
  Administered 2016-04-28: 1 mg via ORAL
  Filled 2016-04-28: qty 1

## 2016-04-28 MED ORDER — HYDROCODONE-ACETAMINOPHEN 5-325 MG PO TABS
1.0000 | ORAL_TABLET | Freq: Once | ORAL | Status: AC
  Administered 2016-04-28: 1 via ORAL
  Filled 2016-04-28: qty 1

## 2016-04-28 MED ORDER — HYDROCODONE-ACETAMINOPHEN 5-325 MG PO TABS: 1.0000 | ORAL_TABLET | Freq: Four times a day (QID) | ORAL | 0 refills | Status: DC | PRN

## 2016-04-28 NOTE — ED Notes (Signed)
Resp was paged at this time and told to bring incentive spir. To ED.

## 2016-04-28 NOTE — ED Triage Notes (Signed)
Pt reports losing his balance yesterday while walking with his walker and is c/o right flank/rib pain.

## 2016-04-28 NOTE — ED Notes (Signed)
Resp paged again and she stated she was on the way to the ED at this time.

## 2016-04-28 NOTE — ED Provider Notes (Signed)
[facility] DEPT Provider Note   CSN: [id number] Arrival date & time: 04/28/16  2585     History   Chief Complaint Chief Complaint  Patient presents with  . Fall    HPI Duane Richardson is a 61 y.o. male.  HPI Patient presents after a ground-level fall yesterday. States he fell onto his right side. Denies any head or neck injury. No loss of consciousness. He isn't having right-sided pain since the fall. Pain is worse with deep inspiration. States he been taking ibuprofen at home with little relief. Also complains of cough but denies any fever or chills. No abdominal pain, nausea or vomiting. No focal weakness or numbness. Patient states he drinks regularly. He has not taken his amlodipine this morning. He has taken ibuprofen shortly before arrival. Past Medical History:  Diagnosis Date  . Allergy   . Anxiety   . Arthritis   . Asthma   . Bipolar 1 disorder ([place])   . Cataract   . COPD (chronic obstructive pulmonary disease) ([place])   . Depression   . Elevated PSA   . ETOH abuse    quit Oct 2013, then relapsed, as of Dec 2013 now has abstained X 1 month  . GERD (gastroesophageal reflux disease)   . Hyperlipidemia   . Hypertension     Patient Active Problem List   Diagnosis Date Noted  . Acute colitis 12/05/2015  . Syncope 12/05/2015  . Hypokalemia 12/05/2015  . Hypomagnesemia 12/05/2015  . Thrombocytopenia ([place]) 12/05/2015  . Alcohol withdrawal ([place]) 12/04/2015  . Generalized weakness 12/04/2015  . Abdominal pain 12/04/2015  . Nausea & vomiting 12/04/2015  . Arthritis of left hip 06/07/2013  . Status post THR (total hip replacement) 06/07/2013  . BPH (benign prostatic hypertrophy) with urinary obstruction 03/22/2013  . Protein-calorie malnutrition, severe ([place]) 11/30/2012  . Hip fracture ([place]) 11/23/2012  . HTN (hypertension) 11/23/2012  . Steatosis of liver 11/15/2012  . Anorexia nervosa 10/13/2012  . Loss of weight 07/16/2012  . ETOH abuse   . Weight loss  02/17/2012  . Dysphagia 12/30/2011  . Oral candida 12/30/2011  . Rectal bleed 12/30/2011  . Unintentional weight loss 12/30/2011  . Hx of adenomatous colonic polyps 12/30/2011  . Leucopenia 07/08/2007  . COPD, MILD 12/21/2006  . LIVER FUNCTION TESTS, ABNORMAL 11/09/2006  . HYPERLIPIDEMIA 05/27/2006  . DISORDER, BIPOLAR NOS 05/27/2006  . TOBACCO ABUSE 05/27/2006  . Depression 05/27/2006  . CATARACT NOS 05/27/2006  . ALLERGIC RHINITIS 05/27/2006  . ASTHMA 05/27/2006  . GERD 05/27/2006  . ARTHRITIS 05/27/2006  . LOW BACK PAIN, CHRONIC 05/27/2006  . MALAISE AND FATIGUE 05/27/2006    Past Surgical History:  Procedure Laterality Date  . BACK SURGERY     lunbar  . COLONOSCOPY  12/21/09   IDP:OEUM papilla otherwise normal/ pancolonic diverticula/mutiple colonic poylps  . COLONOSCOPY WITH ESOPHAGOGASTRODUODENOSCOPY (EGD) N/A 10/20/2012   Procedure: COLONOSCOPY WITH ESOPHAGOGASTRODUODENOSCOPY (EGD);  Surgeon: Daneil Dolin, MD;  [facility];  Service: Endoscopy;  Laterality: N/A;  10;15  . CYSTOSCOPY N/A 01/31/2013   Procedure: CYSTOSCOPY FLEXIBLE;  Surgeon: Marissa Nestle, MD;  Location: [facility];  Service: Urology;  Laterality: N/A;  I would like to do this around 1 pm on monday.   Marland Kitchen HARDWARE REMOVAL Left 06/07/2013   Procedure: HARDWARE REMOVAL;  Surgeon: Mcarthur Rossetti, MD;  Location: [place];  Service: Orthopedics;  Laterality: Left;  . ORIF HIP FRACTURE Left 11/24/2012   Procedure: OPEN REDUCTION INTERNAL FIXATION HIP;  Surgeon: Sanjuana Kava, MD;  Location: [facility];  Service: Orthopedics;  Laterality: Left;  . SPINE SURGERY    . TOTAL HIP ARTHROPLASTY Left 06/07/2013   Procedure: REMOVE HARDWARE LEFT HIP AND LEFT TOTAL HIP ARTHROPLASTY ANTERIOR APPROACH;  Surgeon: Mcarthur Rossetti, MD;  Location: [place];  Service: Orthopedics;  Laterality: Left;  . TRANSURETHRAL RESECTION OF PROSTATE N/A 03/22/2013   Procedure: TRANSURETHRAL RESECTION OF THE PROSTATE (TURP);   Surgeon: Marissa Nestle, MD;  Location: [facility];  Service: Urology;  Laterality: N/A;       Home Medications    Prior to Admission medications   Medication Sig Start Date End Date Taking? Authorizing Provider  ALPRAZolam Duanne Moron) 1 MG tablet 1 pill every 6 hours for 2 days, then every 8 hrs for 2 days, then every 12 hours for 2 days then 1 pill a day for 2 days. 07/17/14  Yes Susy Frizzle, MD  amLODipine (NORVASC) 10 MG tablet Take 1 tablet (10 mg total) by mouth daily. 10/26/15  Yes Susy Frizzle, MD  hydrochlorothiazide (HYDRODIURIL) 25 MG tablet Take 1 tablet (25 mg total) by mouth daily. 11/27/15  Yes Susy Frizzle, MD  ibuprofen (ADVIL,MOTRIN) 200 MG tablet Take 400 mg by mouth every 8 (eight) hours as needed.   Yes Historical Provider, MD  Menthol, Topical Analgesic, (BIOFREEZE EX) Apply topically.   Yes Historical Provider, MD  omeprazole (PRILOSEC) 20 MG capsule Take 1 capsule (20 mg total) by mouth daily. 09/01/12  Yes Mary B Dixon, PA-C  PROAIR HFA 108 [phone] Base) MCG/ACT inhaler INHALE 2 PUFFS INTO THE LUNGS EVERY 4 TO 6 HOURS AS NEEDED. 01/02/16  Yes Susy Frizzle, MD  sertraline (ZOLOFT) 50 MG tablet Take 1 tablet (50 mg total) by mouth daily. 10/26/15  Yes Susy Frizzle, MD  aspirin EC 325 MG EC tablet Take 1 tablet (325 mg total) by mouth 2 (two) times daily after a meal. Patient not taking: Reported on 04/28/2016 06/10/13   Mcarthur Rossetti, MD  fluocinonide-emollient (LIDEX-E) 0.05 % cream APPLY TO AFFECTED AREA TWICE DAILY. Patient not taking: Reported on 04/28/2016 10/18/15   Susy Frizzle, MD  HYDROcodone-acetaminophen [facility]) 5-325 MG tablet Take 1-2 tablets by mouth every 6 (six) hours as needed for severe pain. 04/28/16   Julianne Rice, MD  hydrOXYzine (ATARAX/VISTARIL) 25 MG tablet Take 1 tablet (25 mg total) by mouth every 6 (six) hours as needed for itching. Patient not taking: Reported on 04/28/2016 08/18/14   Orpah Greek, MD  oseltamivir (TAMIFLU)  75 MG capsule Take 1 capsule (75 mg total) by mouth 2 (two) times daily. Patient not taking: Reported on 04/28/2016 04/11/16   Susy Frizzle, MD    Family History Family History  Problem Relation Age of Onset  . Mental illness Sister   . Colon cancer Neg Hx     Social History Social History  Substance Use Topics  . Smoking status: Current Every Day Smoker    Packs/day: 0.50    Years: 30.00    Types: Cigarettes  . Smokeless tobacco: Never Used  . Alcohol use No     Comment: history of  about a liter of wine per day-before that     Allergies   Patient has no known allergies.   Review of Systems Review of Systems  Constitutional: Negative for chills, diaphoresis, fatigue and fever.  Respiratory: Positive for cough. Negative for shortness of breath.   Cardiovascular: Negative for chest pain and  palpitations.  Gastrointestinal: Negative for abdominal pain, diarrhea, nausea and vomiting.  Genitourinary: Negative for dysuria, flank pain, frequency and hematuria.  Musculoskeletal: Positive for back pain. Negative for myalgias, neck pain and neck stiffness.  Skin: Negative for rash and wound.  Neurological: Positive for tremors. Negative for dizziness, syncope, weakness, light-headedness, numbness and headaches.  All other systems reviewed and are negative.    Physical Exam Updated Vital Signs BP 189/100   Pulse 81   Temp 98 F (36.7 C) (Oral)   Resp 18   Ht '6\' 1"'$  (1.854 m)   Wt 145 lb (65.8 kg)   SpO2 94%   BMI 19.13 kg/m   Physical Exam  Constitutional: He is oriented to person, place, and time. He appears well-developed and well-nourished.  HENT:  Head: Normocephalic and atraumatic.  Mouth/Throat: Oropharynx is clear and moist. No oropharyngeal exudate.  No obvious head injury  Eyes: EOM are normal. Pupils are equal, round, and reactive to light.  Neck: Normal range of motion. Neck supple.  No posterior midline cervical tenderness to palpation.    Cardiovascular: Normal rate and regular rhythm.  Exam reveals no friction rub.   No murmur heard. Pulmonary/Chest: Effort normal and breath sounds normal. No respiratory distress. He has no wheezes. He has no rales. He exhibits no tenderness.  Abdominal: Soft. Bowel sounds are normal. There is no tenderness. There is no rebound and no guarding.  Musculoskeletal: Normal range of motion. He exhibits tenderness. He exhibits no edema.  Patient has right-sided posterior rib tenderness to palpation over the inferior border. No midline thoracic or lumbar tenderness. No pelvic instability. Distal pulses intact.  Neurological: He is alert and oriented to person, place, and time.  5/5 motor in all extremities. Sensation fully intact. Patient has mild tremor  Skin: Skin is warm and dry. Capillary refill takes less than 2 seconds. No rash noted. No erythema.  Psychiatric: He has a normal mood and affect. His behavior is normal.  Nursing note and vitals reviewed.    ED Treatments / Results  Labs (all labs ordered are listed, but only abnormal results are displayed) Labs Reviewed  COMPREHENSIVE METABOLIC PANEL - Abnormal; Notable for the following:       Result Value   Glucose, Bld 124 (*)    Calcium 8.5 (*)    Albumin 3.3 (*)    AST 44 (*)    ALT 15 (*)    All other components within normal limits  CBC WITH DIFFERENTIAL/PLATELET - Abnormal; Notable for the following:    Platelets 124 (*)    All other components within normal limits  ETHANOL  RAPID URINE DRUG SCREEN, HOSP PERFORMED    EKG  EKG Interpretation None       Radiology Dg Ribs Unilateral W/chest Right  Result Date: 04/28/2016 CLINICAL DATA:  Fall a blow to the right side of the chest 04/26/2016. Right rib pain. EXAM: RIGHT RIBS AND CHEST - 3+ VIEW COMPARISON:  Single-view of the chest 12/04/2015. FINDINGS: The lungs are clear. Heart size is normal. There is no pneumothorax or pleural effusion. Aortic atherosclerosis is noted.  Remote right sixth rib fracture is unchanged. The patient has an acute right tenth rib fracture which is minimally displaced. IMPRESSION: Acute minimally displaced right tenth rib fracture. Negative for pneumothorax. Remote right sixth rib fracture. Atherosclerosis. Electronically Signed   By: Inge Rise M.D.   On: 04/28/2016 10:08    Procedures Procedures (including critical care time)  Medications Ordered in ED Medications  LORazepam (ATIVAN) tablet 1 mg (1 mg Oral Given 04/28/16 0933)  amLODipine (NORVASC) tablet 10 mg (10 mg Oral Given 04/28/16 0933)  HYDROcodone-acetaminophen (NORCO/VICODIN) 5-325 MG per tablet 1 tablet (1 tablet Oral Given 04/28/16 0933)     Initial Impression / Assessment and Plan / ED Course  I have reviewed the triage vital signs and the nursing notes.  Pertinent labs & imaging results that were available during my care of the patient were reviewed by me and considered in my medical decision making (see chart for details).     Patient is now much more comfortable. Maintained stable vital signs. Does not appear to be an alcohol withdrawal. Low suspicion for intra-abdominal injury given stable vital signs and hemoglobin. Abdominal exam remains benign. We'll give and incentive spirometer and prescription for pain medication. Advised to follow-up with his primary physician.  Final Clinical Impressions(s) / ED Diagnoses   Final diagnoses:  Closed fracture of one rib of right side, initial encounter    New Prescriptions New Prescriptions   HYDROCODONE-ACETAMINOPHEN (NORCO) 5-325 MG TABLET    Take 1-2 tablets by mouth every 6 (six) hours as needed for severe pain.     Julianne Rice, MD 04/28/16 1121

## 2016-05-05 ENCOUNTER — Ambulatory Visit: Payer: Medicaid Other | Admitting: Family Medicine

## 2016-05-09 ENCOUNTER — Encounter: Payer: Self-pay | Admitting: Family Medicine

## 2016-05-09 ENCOUNTER — Ambulatory Visit (INDEPENDENT_AMBULATORY_CARE_PROVIDER_SITE_OTHER): Payer: Medicaid Other | Admitting: Family Medicine

## 2016-05-09 VITALS — BP 162/100 | HR 86 | Temp 97.5°F | Resp 18 | Ht 73.0 in | Wt 154.0 lb

## 2016-05-09 DIAGNOSIS — E7800 Pure hypercholesterolemia, unspecified: Secondary | ICD-10-CM

## 2016-05-09 DIAGNOSIS — S2231XA Fracture of one rib, right side, initial encounter for closed fracture: Secondary | ICD-10-CM

## 2016-05-09 DIAGNOSIS — I7090 Unspecified atherosclerosis: Secondary | ICD-10-CM

## 2016-05-09 DIAGNOSIS — I10 Essential (primary) hypertension: Secondary | ICD-10-CM | POA: Diagnosis not present

## 2016-05-09 MED ORDER — HYDROCODONE-ACETAMINOPHEN 5-325 MG PO TABS: 1.0000 | ORAL_TABLET | Freq: Four times a day (QID) | ORAL | 0 refills | Status: DC | PRN

## 2016-05-09 MED ORDER — HYDROCHLOROTHIAZIDE 25 MG PO TABS: 25.0000 mg | ORAL_TABLET | Freq: Every day | ORAL | 3 refills | Status: DC

## 2016-05-09 MED ORDER — ATORVASTATIN CALCIUM 10 MG PO TABS: 10.0000 mg | ORAL_TABLET | Freq: Every day | ORAL | 3 refills | Status: DC

## 2016-05-09 NOTE — Progress Notes (Signed)
Subjective:    Patient ID: Duane Richardson, male    DOB: [birth year], 61 y.o.   MRN: [id number]  HPI Patient is a 61 year old African-American male who recently fell and was seen in emergency room and diagnosed with a right-sided 10th rib fracture that was nondisplaced. He was given 15 hydrocodone in the emergency room and instructed to follow-up with me. He continues to report significant pleurisy. He is also having occasional cough. He denies any fevers or chills. The cough is nonproductive. Of note, on the x-ray, the radiologist made note of coincidental atherosclerosis. The patient continues to smoke. He is not taking his blood pressure medication. He apparently has run out of hydrochlorothiazide and he has not been taking amlodipine. His LDL cholesterol was found to be greater than 100 last August when checked with his physical. All these risk factors contributing to his atherosclerosis Past Medical History:  Diagnosis Date  . Allergy   . Anxiety   . Arthritis   . Asthma   . Bipolar 1 disorder ([place])   . Cataract   . COPD (chronic obstructive pulmonary disease) ([place])   . Depression   . Elevated PSA   . ETOH abuse    quit Oct 2013, then relapsed, as of Dec 2013 now has abstained X 1 month  . GERD (gastroesophageal reflux disease)   . Hyperlipidemia   . Hypertension    Past Surgical History:  Procedure Laterality Date  . BACK SURGERY     lunbar  . COLONOSCOPY  12/21/09   ION:GEXB papilla otherwise normal/ pancolonic diverticula/mutiple colonic poylps  . COLONOSCOPY WITH ESOPHAGOGASTRODUODENOSCOPY (EGD) N/A 10/20/2012   Procedure: COLONOSCOPY WITH ESOPHAGOGASTRODUODENOSCOPY (EGD);  Surgeon: Daneil Dolin, MD;  [facility];  Service: Endoscopy;  Laterality: N/A;  10;15  . CYSTOSCOPY N/A 01/31/2013   Procedure: CYSTOSCOPY FLEXIBLE;  Surgeon: Marissa Nestle, MD;  Location[facility];  Service: Urology;  Laterality: N/A;  I would like to do this around 1 pm on monday.   Marland Kitchen  HARDWARE REMOVAL Left 06/07/2013   Procedure: HARDWARE REMOVAL;  Surgeon: Mcarthur Rossetti, MD;  Location: [place];  Service: Orthopedics;  Laterality: Left;  . ORIF HIP FRACTURE Left 11/24/2012   Procedure: OPEN REDUCTION INTERNAL FIXATION HIP;  Surgeon: Sanjuana Kava, MD;  Location[facility];  Service: Orthopedics;  Laterality: Left;  . SPINE SURGERY    . TOTAL HIP ARTHROPLASTY Left 06/07/2013   Procedure: REMOVE HARDWARE LEFT HIP AND LEFT TOTAL HIP ARTHROPLASTY ANTERIOR APPROACH;  Surgeon: Mcarthur Rossetti, MD;  Location: [place];  Service: Orthopedics;  Laterality: Left;  . TRANSURETHRAL RESECTION OF PROSTATE N/A 03/22/2013   Procedure: TRANSURETHRAL RESECTION OF THE PROSTATE (TURP);  Surgeon: Marissa Nestle, MD;  Location: [facility];  Service: Urology;  Laterality: N/A;   Current Outpatient Prescriptions on File Prior to Visit  Medication Sig Dispense Refill  . amLODipine (NORVASC) 10 MG tablet Take 1 tablet (10 mg total) by mouth daily. 90 tablet 3  . aspirin EC 325 MG EC tablet Take 1 tablet (325 mg total) by mouth 2 (two) times daily after a meal. 30 tablet 0  . ibuprofen (ADVIL,MOTRIN) 200 MG tablet Take 400 mg by mouth every 8 (eight) hours as needed.    Marland Kitchen PROAIR HFA 108 (90 Base) MCG/ACT inhaler INHALE 2 PUFFS INTO THE LUNGS EVERY 4 TO 6 HOURS AS NEEDED. 8.5 g 0  . sertraline (ZOLOFT) 50 MG tablet Take 1 tablet (50 mg total) by mouth daily.  90 tablet 3   No current facility-administered medications on file prior to visit.    No Known Allergies Social History   Social History  . Marital status: Single    Spouse name: N/A  . Number of children: 0  . Years of education: N/A   Occupational History  . disabled    Social History Main Topics  . Smoking status: Current Every Day Smoker    Packs/day: 0.50    Years: 30.00    Types: Cigarettes  . Smokeless tobacco: Never Used  . Alcohol use No     Comment: history of  about a liter of wine per day-before that  . Drug use:  Yes    Types: Marijuana  . Sexual activity: Yes    Birth control/ protection: None   Other Topics Concern  . Not on file   Social History Narrative   In prison x 2, 14rs, 29 mo   Out for several yrs   Lives alone            Review of Systems  All other systems reviewed and are negative.      Objective:   Physical Exam  Constitutional: He appears well-developed and well-nourished.  Neck: No JVD present.  Cardiovascular: Normal rate, regular rhythm and normal heart sounds.   Pulmonary/Chest: Effort normal. No respiratory distress. He has wheezes. He has no rales. He exhibits tenderness.  Abdominal: Soft. Bowel sounds are normal.  Musculoskeletal: He exhibits no edema.  Lymphadenopathy:    He has no cervical adenopathy.  Vitals reviewed.         Assessment & Plan:  Atherosclerosis - Plan: atorvastatin (LIPITOR) 10 MG tablet  Benign essential HTN - Plan: hydrochlorothiazide (HYDRODIURIL) 25 MG tablet  Pure hypercholesterolemia  Closed fracture of one rib of right side, initial encounter  Patient's blood pressure is extremely high today. Resume amlodipine 10 mg a day. Add hydrochlorothiazide 25 mg a day. Recheck blood pressure in 2 weeks. There is no evidence of complications from his rib fracture. Continue to encourage incentive spirometry. I'll refill the patient's Norco 5/325 one by mouth every 6 hours when necessary pain. I will give 15. Be very cautious about future refills given his past medical history. I'm very concerned about the questionable finding of atherosclerosis on his chest x-ray. We discussed this at length for more than 20 minutes risk factors that contribute to this including smoking, alcohol abuse, hypertension, hyperlipidemia. Recommended smoking cessation and avoidance of alcohol. Recommended better control his blood pressures as I have already addressed earlier. Start Lipitor 10 mg a day. Begin aspirin 81 mg a day. Goal LDL cholesterol is less than  70 if possible. Strongly encouraged the patient quit smoking. Recheck in 2 weeks

## 2016-05-17 ENCOUNTER — Other Ambulatory Visit: Payer: Self-pay | Admitting: Family Medicine

## 2016-05-29 ENCOUNTER — Encounter: Payer: Self-pay | Admitting: Family Medicine

## 2016-05-29 ENCOUNTER — Ambulatory Visit (INDEPENDENT_AMBULATORY_CARE_PROVIDER_SITE_OTHER): Payer: Medicaid Other | Admitting: Family Medicine

## 2016-05-29 VITALS — BP 138/88 | HR 101 | Temp 97.6°F | Resp 18 | Ht 73.0 in | Wt 148.0 lb

## 2016-05-29 DIAGNOSIS — E7800 Pure hypercholesterolemia, unspecified: Secondary | ICD-10-CM | POA: Diagnosis not present

## 2016-05-29 DIAGNOSIS — I7090 Unspecified atherosclerosis: Secondary | ICD-10-CM

## 2016-05-29 DIAGNOSIS — I10 Essential (primary) hypertension: Secondary | ICD-10-CM

## 2016-05-29 NOTE — Progress Notes (Signed)
Subjective:    Patient ID: Duane Richardson, male    DOB: 01/30/1956, 61 y.o.   MRN: [id number]  HPI  04/2016 Patient is a 60 year old African-American male who recently fell and was seen in emergency room and diagnosed with a right-sided 10th rib fracture that was nondisplaced. He was given 15 hydrocodone in the emergency room and instructed to follow-up with me. He continues to report significant pleurisy. He is also having occasional cough. He denies any fevers or chills. The cough is nonproductive. Of note, on the x-ray, the radiologist made note of coincidental atherosclerosis. The patient continues to smoke. He is not taking his blood pressure medication. He apparently has run out of hydrochlorothiazide and he has not been taking amlodipine. His LDL cholesterol was found to be greater than 100 last August when checked with his physical. All these risk factors contributing to his atherosclerosis.  At that time, my plan was: Patient's blood pressure is extremely high today. Resume amlodipine 10 mg a day. Add hydrochlorothiazide 25 mg a day. Recheck blood pressure in 2 weeks. There is no evidence of complications from his rib fracture. Continue to encourage incentive spirometry. I'll refill the patient's Norco 5/325 one by mouth every 6 hours when necessary pain. I will give 15. Be very cautious about future refills given his past medical history. I'm very concerned about the questionable finding of atherosclerosis on his chest x-ray. We discussed this at length for more than 20 minutes risk factors that contribute to this including smoking, alcohol abuse, hypertension, hyperlipidemia. Recommended smoking cessation and avoidance of alcohol. Recommended better control his blood pressures as I have already addressed earlier. Start Lipitor 10 mg a day. Begin aspirin 81 mg a day. Goal LDL cholesterol is less than 70 if possible. Strongly encouraged the patient quit smoking. Recheck in 2 weeks  05/29/16 His  blood pressure today is better at 138/88. He denies any chest pain shortness of breath or dyspnea on exertion. He has no further pleurisy from his previous rib fracture. He denies any cough or hemoptysis. Unfortunately he continues to smoke. He states that he is refraining from all alcohol although I do believe I smell alcohol on his body. Past Medical History:  Diagnosis Date  . Allergy   . Anxiety   . Arthritis   . Asthma   . Atherosclerosis   . Bipolar 1 disorder ([place])   . Cataract   . COPD (chronic obstructive pulmonary disease) ([place])   . Depression   . Elevated PSA   . ETOH abuse    quit Oct 2013, then relapsed, as of Dec 2013 now has abstained X 1 month  . GERD (gastroesophageal reflux disease)   . Hyperlipidemia   . Hypertension    Past Surgical History:  Procedure Laterality Date  . BACK SURGERY     lunbar  . COLONOSCOPY  12/21/09   HWE:XHBZ papilla otherwise normal/ pancolonic diverticula/mutiple colonic poylps  . COLONOSCOPY WITH ESOPHAGOGASTRODUODENOSCOPY (EGD) N/A 10/20/2012   Procedure: COLONOSCOPY WITH ESOPHAGOGASTRODUODENOSCOPY (EGD);  Surgeon: Daneil Dolin, MD;  [facility];  Service: Endoscopy;  Laterality: N/A;  10;15  . CYSTOSCOPY N/A 01/31/2013   Procedure: CYSTOSCOPY FLEXIBLE;  Surgeon: Marissa Nestle, MD;  Location: [facility];  Service: Urology;  Laterality: N/A;  I would like to do this around 1 pm on monday.   Marland Kitchen HARDWARE REMOVAL Left 06/07/2013   Procedure: HARDWARE REMOVAL;  Surgeon: Mcarthur Rossetti, MD;  Location: [place];  Service: Orthopedics;  Laterality: Left;  . ORIF HIP FRACTURE Left 11/24/2012   Procedure: OPEN REDUCTION INTERNAL FIXATION HIP;  Surgeon: Sanjuana Kava, MD;  Location: [facility];  Service: Orthopedics;  Laterality: Left;  . SPINE SURGERY    . TOTAL HIP ARTHROPLASTY Left 06/07/2013   Procedure: REMOVE HARDWARE LEFT HIP AND LEFT TOTAL HIP ARTHROPLASTY ANTERIOR APPROACH;  Surgeon: Mcarthur Rossetti, MD;  Location: [place];   Service: Orthopedics;  Laterality: Left;  . TRANSURETHRAL RESECTION OF PROSTATE N/A 03/22/2013   Procedure: TRANSURETHRAL RESECTION OF THE PROSTATE (TURP);  Surgeon: Marissa Nestle, MD;  [facility];  Service: Urology;  Laterality: N/A;   Current Outpatient Prescriptions on File Prior to Visit  Medication Sig Dispense Refill  . amLODipine (NORVASC) 10 MG tablet Take 1 tablet (10 mg total) by mouth daily. 90 tablet 3  . aspirin EC 325 MG EC tablet Take 1 tablet (325 mg total) by mouth 2 (two) times daily after a meal. 30 tablet 0  . atorvastatin (LIPITOR) 10 MG tablet Take 1 tablet (10 mg total) by mouth daily. 90 tablet 3  . brimonidine-timolol (COMBIGAN) 0.2-0.5 % ophthalmic solution Place 1 drop into both eyes every 12 (twelve) hours.    . hydrochlorothiazide (HYDRODIURIL) 25 MG tablet Take 1 tablet (25 mg total) by mouth daily. 90 tablet 3  . HYDROcodone-acetaminophen (NORCO) 5-325 MG tablet Take 1-2 tablets by mouth every 6 (six) hours as needed for severe pain. 15 tablet 0  . ibuprofen (ADVIL,MOTRIN) 200 MG tablet Take 400 mg by mouth every 8 (eight) hours as needed.    . sertraline (ZOLOFT) 50 MG tablet Take 1 tablet (50 mg total) by mouth daily. 90 tablet 3  . travoprost, benzalkonium, (TRAVATAN) 0.004 % ophthalmic solution Place 1 drop into both eyes at bedtime.    Marland Kitchen PROAIR HFA 108 (90 Base) MCG/ACT inhaler INHALE 2 PUFFS INTO THE LUNGS EVERY 4 TO 6 HOURS AS NEEDED. (Patient not taking: Reported on 05/29/2016) 8.5 g 2   No current facility-administered medications on file prior to visit.    No Known Allergies Social History   Social History  . Marital status: Single    Spouse name: N/A  . Number of children: 0  . Years of education: N/A   Occupational History  . disabled    Social History Main Topics  . Smoking status: Current Every Day Smoker    Packs/day: 0.50    Years: 30.00    Types: Cigarettes  . Smokeless tobacco: Never Used  . Alcohol use No     Comment:  history of  about a liter of wine per day-before that  . Drug use: Yes    Types: Marijuana  . Sexual activity: Yes    Birth control/ protection: None   Other Topics Concern  . Not on file   Social History Narrative   In prison x 2, 14rs, 29 mo   Out for several yrs   Lives alone            Review of Systems  All other systems reviewed and are negative.      Objective:   Physical Exam  Constitutional: He appears well-developed and well-nourished.  Neck: No JVD present.  Cardiovascular: Normal rate, regular rhythm and normal heart sounds.   Pulmonary/Chest: Effort normal. No respiratory distress. He has no wheezes. He has no rales. He exhibits no tenderness.  Abdominal: Soft. Bowel sounds are normal.  Musculoskeletal: He exhibits no edema.  Lymphadenopathy:    He  has no cervical adenopathy.  Vitals reviewed.         Assessment & Plan:  Benign essential HTN  Atherosclerosis  Pure hypercholesterolemia Blood pressure today is better.  Patient needs to quit smoking. Strongly recommended he do this. Return in 3 months to recheck a fasting lipid panel.. Goal LDL cholesterol is less than 70

## 2016-06-27 ENCOUNTER — Other Ambulatory Visit: Payer: Self-pay | Admitting: Family Medicine

## 2016-06-27 DIAGNOSIS — I10 Essential (primary) hypertension: Secondary | ICD-10-CM

## 2016-08-05 ENCOUNTER — Other Ambulatory Visit: Payer: Self-pay | Admitting: Family Medicine

## 2016-08-05 DIAGNOSIS — I10 Essential (primary) hypertension: Secondary | ICD-10-CM

## 2016-08-28 ENCOUNTER — Encounter: Payer: Self-pay | Admitting: Family Medicine

## 2016-08-28 ENCOUNTER — Ambulatory Visit (INDEPENDENT_AMBULATORY_CARE_PROVIDER_SITE_OTHER): Payer: Medicaid Other | Admitting: Family Medicine

## 2016-08-28 ENCOUNTER — Other Ambulatory Visit: Payer: Self-pay | Admitting: Family Medicine

## 2016-08-28 VITALS — BP 98/74 | HR 96 | Temp 97.6°F | Resp 18 | Ht 73.0 in | Wt 143.0 lb

## 2016-08-28 DIAGNOSIS — K852 Alcohol induced acute pancreatitis without necrosis or infection: Secondary | ICD-10-CM

## 2016-08-28 LAB — COMPLETE METABOLIC PANEL WITH GFR
ALT: 32 U/L (ref 9–46)
AST: 72 U/L — ABNORMAL HIGH (ref 10–35)
Albumin: 3.7 g/dL (ref 3.6–5.1)
Alkaline Phosphatase: 143 U/L — ABNORMAL HIGH (ref 40–115)
BUN: 5 mg/dL — ABNORMAL LOW (ref 7–25)
CO2: 20 mmol/L (ref 20–31)
Calcium: 8.7 mg/dL (ref 8.6–10.3)
Chloride: 100 mmol/L (ref 98–110)
Creat: 0.87 mg/dL (ref 0.70–1.25)
GFR, Est African American: >89 mL/min (ref >=60)
GFR, Est Non African American: >89 mL/min (ref >=60)
Glucose, Bld: 110 mg/dL — ABNORMAL HIGH (ref 70–99)
Potassium: 3.5 mmol/L (ref 3.5–5.3)
Sodium: 134 mmol/L — ABNORMAL LOW (ref 135–146)
Total Bilirubin: 1.4 mg/dL — ABNORMAL HIGH (ref 0.2–1.2)
Total Protein: 6.9 g/dL (ref 6.1–8.1)

## 2016-08-28 MED ORDER — ALPRAZOLAM 0.5 MG PO TABS: 0.5000 mg | ORAL_TABLET | Freq: Three times a day (TID) | ORAL | 0 refills | Status: DC

## 2016-08-28 NOTE — Progress Notes (Signed)
Subjective:    Patient ID: Duane Richardson, male    DOB: May 04, 1955, 61 y.o.   MRN: [id number]  HPI Patient is a 61 year old African-American male with a history of alcoholism. He admits to me today that he drinks a fifth of liquor a day. He will drink a pint most days. His last episode of drinking was 2 days ago. Shortly thereafter, he developed intense epigastric cramping, gnawing abdominal pain. He is tender to palpation below the xiphoid process. He is unable to eat. He reports feeling nauseated. He feels extremely weak. His blood pressure today is low. He is not able to eat or drink due to pain. The only food he is been able to eat or saltine crackers. History and presentation are consistent with pancreatitis vs PUD.   Past Medical History:  Diagnosis Date  . Allergy   . Anxiety   . Arthritis   . Asthma   . Atherosclerosis   . Bipolar 1 disorder ([place])   . Cataract   . COPD (chronic obstructive pulmonary disease) ([place])   . Depression   . Elevated PSA   . ETOH abuse    quit Oct 2013, then relapsed, as of Dec 2013 now has abstained X 1 month  . GERD (gastroesophageal reflux disease)   . Hyperlipidemia   . Hypertension    Past Surgical History:  Procedure Laterality Date  . BACK SURGERY     lunbar  . COLONOSCOPY  12/21/09   OAC:ZYSA papilla otherwise normal/ pancolonic diverticula/mutiple colonic poylps  . COLONOSCOPY WITH ESOPHAGOGASTRODUODENOSCOPY (EGD) N/A 10/20/2012   Procedure: COLONOSCOPY WITH ESOPHAGOGASTRODUODENOSCOPY (EGD);  Surgeon: Daneil Dolin, MD;  [facility];  Service: Endoscopy;  Laterality: N/A;  10;15  . CYSTOSCOPY N/A 01/31/2013   Procedure: CYSTOSCOPY FLEXIBLE;  Surgeon: Marissa Nestle, MD;  [facility];  Service: Urology;  Laterality: N/A;  I would like to do this around 1 pm on monday.   Marland Kitchen HARDWARE REMOVAL Left 06/07/2013   Procedure: HARDWARE REMOVAL;  Surgeon: Mcarthur Rossetti, MD;  Location: [place];  Service: Orthopedics;   Laterality: Left;  . ORIF HIP FRACTURE Left 11/24/2012   Procedure: OPEN REDUCTION INTERNAL FIXATION HIP;  Surgeon: Sanjuana Kava, MD;  [facility];  Service: Orthopedics;  Laterality: Left;  . SPINE SURGERY    . TOTAL HIP ARTHROPLASTY Left 06/07/2013   Procedure: REMOVE HARDWARE LEFT HIP AND LEFT TOTAL HIP ARTHROPLASTY ANTERIOR APPROACH;  Surgeon: Mcarthur Rossetti, MD;  Location: [place];  Service: Orthopedics;  Laterality: Left;  . TRANSURETHRAL RESECTION OF PROSTATE N/A 03/22/2013   Procedure: TRANSURETHRAL RESECTION OF THE PROSTATE (TURP);  Surgeon: Marissa Nestle, MD;  [facility];  Service: Urology;  Laterality: N/A;   Current Outpatient Prescriptions on File Prior to Visit  Medication Sig Dispense Refill  . amLODipine (NORVASC) 10 MG tablet TAKE ONE TABLET BY MOUTH ONCE DAILY. 30 tablet 0  . aspirin EC 325 MG EC tablet Take 1 tablet (325 mg total) by mouth 2 (two) times daily after a meal. 30 tablet 0  . atorvastatin (LIPITOR) 10 MG tablet Take 1 tablet (10 mg total) by mouth daily. 90 tablet 3  . hydrochlorothiazide (HYDRODIURIL) 25 MG tablet Take 1 tablet (25 mg total) by mouth daily. 90 tablet 3  . PROAIR HFA 108 (90 Base) MCG/ACT inhaler INHALE 2 PUFFS INTO THE LUNGS EVERY 4 TO 6 HOURS AS NEEDED. 8.5 g 2  . sertraline (ZOLOFT) 50 MG tablet Take 1 tablet (  50 mg total) by mouth daily. 90 tablet 3   No current facility-administered medications on file prior to visit.    No Known Allergies Social History   Social History  . Marital status: Single    Spouse name: N/A  . Number of children: 0  . Years of education: N/A   Occupational History  . disabled    Social History Main Topics  . Smoking status: Current Every Day Smoker    Packs/day: 0.50    Years: 30.00    Types: Cigarettes  . Smokeless tobacco: Never Used  . Alcohol use No     Comment: history of  about a liter of wine per day-before that  . Drug use: Yes    Types: Marijuana  . Sexual activity: Yes     Birth control/ protection: None   Other Topics Concern  . Not on file   Social History Narrative   In prison x 2, 14rs, 29 mo   Out for several yrs   Lives alone            Review of Systems  Gastrointestinal: Positive for abdominal pain and nausea. Negative for abdominal distention, blood in stool, constipation, diarrhea and vomiting.       Objective:   Physical Exam  Constitutional: He appears well-developed and well-nourished. No distress.  Neck: Neck supple.  Cardiovascular: Normal rate, regular rhythm and normal heart sounds.   No murmur heard. Pulmonary/Chest: Effort normal and breath sounds normal. No respiratory distress. He has no wheezes. He has no rales.  Abdominal: Soft. Bowel sounds are normal. He exhibits no distension and no mass. There is tenderness. There is no rebound and no guarding.  Skin: He is not diaphoretic.  Vitals reviewed.         Assessment & Plan:  Alcohol-induced acute pancreatitis, unspecified complication status - Plan: CBC with Differential/Platelet, COMPLETE METABOLIC PANEL WITH GFR, Lipase  I believe the patient has acute pancreatitis likely secondary to alcohol. He is appears extremely weak today. He was given 2 L of normal saline IV bolus due to dehydration. I recommended he discontinue amlodipine, Lipitor, hydrochlorothiazide at the present time because of his low blood pressure. He appears dehydrated. I believe he needs evaluation in the emergency room. Patient refuses to go the emergency room. I explained to the patient that if he drinks alcohol this will only get worse. I'm also concerned about possible delirium tremens from alcohol withdrawal if he abruptly stops. I will check a CBC, CMP, and lipase to evaluate further.  Patient received 1 L of IV fluid. I reassessed the patient after 45 minutes. He states that his pain is better and he continues to refuse to go to the emergency room. I explained to him that pancreatitis can  become life threatening. He must avoid eating food until the pain is better. I recommended a clear liquid diet to maintain hydration. I recommended complete abstinence from alcohol. However I'm concerned about possible delirium tremens if he quits alcohol cold [place].  However I am also concerned about giving this patient benzodiazepines to self administer at home. He continues to refuse ER evaluation. Therefore I will start the patient on Xanax 0.5 mg by mouth every 8 hours and recheck the patient in 24 hours. He knows to go the hospital immediately if symptoms worsen.

## 2016-08-29 LAB — CBC WITH DIFFERENTIAL/PLATELET
Basophils Absolute: 0 cells/uL (ref 0–200)
Basophils Relative: 0 %
Eosinophils Absolute: 0 cells/uL — ABNORMAL LOW (ref 15–500)
Eosinophils Relative: 0 %
HCT: 44.8 % (ref 38.5–50.0)
Hemoglobin: 15.6 g/dL (ref 13.0–17.0)
Lymphocytes Relative: 45 %
Lymphs Abs: 1035 cells/uL (ref 850–3900)
MCH: 31.7 pg (ref 27.0–33.0)
MCHC: 34.8 g/dL (ref 32.0–36.0)
MCV: 91.1 fL (ref 80.0–100.0)
MPV: 12 fL (ref 7.5–12.5)
Monocytes Absolute: 690 cells/uL (ref 200–950)
Monocytes Relative: 30 %
Neutro Abs: 575 cells/uL — ABNORMAL LOW (ref 1500–7800)
Neutrophils Relative %: 25 %
Platelets: 63 10*3/uL — ABNORMAL LOW (ref 140–400)
RBC: 4.92 MIL/uL (ref 4.20–5.80)
RDW: 15.7 % — ABNORMAL HIGH (ref 11.0–15.0)
WBC: 2.3 10*3/uL — ABNORMAL LOW (ref 3.8–10.8)

## 2016-08-29 LAB — LIPASE: Lipase: 36 U/L (ref 7–60)

## 2016-08-29 LAB — PATHOLOGIST SMEAR REVIEW

## 2016-09-02 ENCOUNTER — Ambulatory Visit: Payer: Medicaid Other | Admitting: Family Medicine

## 2016-09-05 ENCOUNTER — Encounter: Payer: Self-pay | Admitting: Family Medicine

## 2016-11-11 ENCOUNTER — Telehealth (INDEPENDENT_AMBULATORY_CARE_PROVIDER_SITE_OTHER): Payer: Self-pay

## 2016-11-11 NOTE — Telephone Encounter (Signed)
Can you help me with this please

## 2016-11-11 NOTE — Telephone Encounter (Signed)
Stephanie with [employer] called regarding some paperwork that was faxed over the first week in September. She would like a call back regarding the status of this paperwork. Please call her back at [phone] Order# [id number]

## 2016-11-13 NOTE — Telephone Encounter (Signed)
I don't have any paperwork for this patient.

## 2016-12-01 ENCOUNTER — Encounter: Payer: Self-pay | Admitting: Family Medicine

## 2016-12-04 ENCOUNTER — Telehealth (INDEPENDENT_AMBULATORY_CARE_PROVIDER_SITE_OTHER): Payer: Self-pay | Admitting: Orthopaedic Surgery

## 2016-12-04 NOTE — Telephone Encounter (Signed)
Patient called requesting home health care since he's needing some help getting around the house. CB # [phone]

## 2016-12-05 NOTE — Telephone Encounter (Signed)
See about home health; but nothing else we can do

## 2016-12-05 NOTE — Telephone Encounter (Signed)
See below I'm not sure it really works like that?

## 2016-12-05 NOTE — Telephone Encounter (Signed)
Told patient to maybe try his PCP and see if they can help with [facility] since we haven't done surgery on him since Jan. I am sure his Medicaid insurance will not cover for [place] to order that since we haven't seen him

## 2016-12-16 ENCOUNTER — Other Ambulatory Visit: Payer: Self-pay | Admitting: Family Medicine

## 2016-12-16 NOTE — Telephone Encounter (Signed)
ok 

## 2016-12-16 NOTE — Telephone Encounter (Signed)
Ok to refill 

## 2016-12-17 ENCOUNTER — Other Ambulatory Visit: Payer: Self-pay | Admitting: Family Medicine

## 2016-12-17 MED ORDER — SERTRALINE HCL 50 MG PO TABS: 50.0000 mg | ORAL_TABLET | Freq: Every day | ORAL | 3 refills | Status: DC

## 2016-12-29 ENCOUNTER — Encounter: Payer: Self-pay | Admitting: Family Medicine

## 2017-01-26 ENCOUNTER — Encounter: Payer: Self-pay | Admitting: Family Medicine

## 2017-02-03 ENCOUNTER — Telehealth (INDEPENDENT_AMBULATORY_CARE_PROVIDER_SITE_OTHER): Payer: Self-pay | Admitting: Orthopaedic Surgery

## 2017-02-03 NOTE — Telephone Encounter (Signed)
Error

## 2017-02-13 ENCOUNTER — Telehealth (INDEPENDENT_AMBULATORY_CARE_PROVIDER_SITE_OTHER): Payer: Self-pay | Admitting: Orthopaedic Surgery

## 2017-02-13 NOTE — Telephone Encounter (Signed)
Cici, from [facility], called stating that they need the medicaid form to be fully completed.  It was faxed to [place] by them on the 13th.  CB#[phone].  Thank you.

## 2017-02-19 NOTE — Telephone Encounter (Signed)
Received and faxed back a form for parts.copy on you desk

## 2017-02-19 NOTE — Telephone Encounter (Signed)
Have you seen anything on him yet?

## 2017-02-23 ENCOUNTER — Telehealth (INDEPENDENT_AMBULATORY_CARE_PROVIDER_SITE_OTHER): Payer: Self-pay | Admitting: Orthopaedic Surgery

## 2017-02-23 NOTE — Telephone Encounter (Signed)
[place] medicaid form  [place]   #19 height and weight needs to be completed   Primary Diagnosis needs to be checked off   Bottom right side needs to be corrected signed and dated   [phone]

## 2017-03-02 NOTE — Telephone Encounter (Signed)
Completed and faxed back

## 2017-03-11 ENCOUNTER — Telehealth (INDEPENDENT_AMBULATORY_CARE_PROVIDER_SITE_OTHER): Payer: Self-pay | Admitting: Orthopaedic Surgery

## 2017-03-11 NOTE — Telephone Encounter (Signed)
Judy from [facility] called asking for a complete medicaid form to be faxed over for the patient signed and dated on the bottom right. States they have faxed over the forms several times. Order # [id number] CB # [phone] Fax # [phone]

## 2017-03-12 NOTE — Telephone Encounter (Signed)
Faxed AGAIN to the provided number

## 2017-03-17 ENCOUNTER — Other Ambulatory Visit: Payer: Self-pay | Admitting: Family Medicine

## 2017-04-03 ENCOUNTER — Telehealth (INDEPENDENT_AMBULATORY_CARE_PROVIDER_SITE_OTHER): Payer: Self-pay | Admitting: Orthopaedic Surgery

## 2017-04-03 NOTE — Telephone Encounter (Signed)
Stephanie from [facility] called saying a [place] medicaid form was faxed over for Dr. [name] to fill out and they were just needing that sent back to them at [phone]  CB# [phone]

## 2017-04-03 NOTE — Telephone Encounter (Signed)
Faxed again

## 2017-04-14 ENCOUNTER — Other Ambulatory Visit: Payer: Self-pay | Admitting: Family Medicine

## 2017-04-29 ENCOUNTER — Telehealth (INDEPENDENT_AMBULATORY_CARE_PROVIDER_SITE_OTHER): Payer: Self-pay | Admitting: Orthopaedic Surgery

## 2017-04-29 NOTE — Telephone Encounter (Signed)
Duane Richardson with [employer] called needing form faxed back to her when it is completed The fax# is [phone]     Order number [id number]   [facility] request for prior approval CMN/PA

## 2017-04-30 NOTE — Telephone Encounter (Signed)
Completed and faxed.

## 2017-04-30 NOTE — Telephone Encounter (Signed)
Duane Richardson called back pertaining  to message below

## 2017-04-30 NOTE — Telephone Encounter (Signed)
Order was placed on hold and they will make no more phone calls to the office

## 2017-08-30 ENCOUNTER — Emergency Department (HOSPITAL_COMMUNITY): Payer: Medicaid Other

## 2017-08-30 ENCOUNTER — Other Ambulatory Visit: Payer: Self-pay

## 2017-08-30 ENCOUNTER — Emergency Department (HOSPITAL_COMMUNITY)
Admission: EM | Admit: 2017-08-30 | Discharge: 2017-08-31 | Disposition: A | Discharge status: Home / Self Care | Payer: Medicaid Other | Attending: Emergency Medicine | Admitting: Emergency Medicine

## 2017-08-30 ENCOUNTER — Encounter (HOSPITAL_COMMUNITY): Payer: Self-pay | Admitting: *Deleted

## 2017-08-30 DIAGNOSIS — J449 Chronic obstructive pulmonary disease, unspecified: Secondary | ICD-10-CM | POA: Insufficient documentation

## 2017-08-30 DIAGNOSIS — Z7982 Long term (current) use of aspirin: Secondary | ICD-10-CM | POA: Diagnosis not present

## 2017-08-30 DIAGNOSIS — I10 Essential (primary) hypertension: Secondary | ICD-10-CM | POA: Diagnosis not present

## 2017-08-30 DIAGNOSIS — F17210 Nicotine dependence, cigarettes, uncomplicated: Secondary | ICD-10-CM | POA: Insufficient documentation

## 2017-08-30 DIAGNOSIS — M6281 Muscle weakness (generalized): Secondary | ICD-10-CM | POA: Diagnosis not present

## 2017-08-30 DIAGNOSIS — R251 Tremor, unspecified: Secondary | ICD-10-CM | POA: Insufficient documentation

## 2017-08-30 DIAGNOSIS — Z79899 Other long term (current) drug therapy: Secondary | ICD-10-CM | POA: Diagnosis not present

## 2017-08-30 DIAGNOSIS — R109 Unspecified abdominal pain: Secondary | ICD-10-CM | POA: Diagnosis not present

## 2017-08-30 DIAGNOSIS — Z96642 Presence of left artificial hip joint: Secondary | ICD-10-CM | POA: Insufficient documentation

## 2017-08-30 DIAGNOSIS — R531 Weakness: Secondary | ICD-10-CM

## 2017-08-30 LAB — COMPREHENSIVE METABOLIC PANEL
ALT: 39 U/L (ref 0–44)
AST: 230 U/L — ABNORMAL HIGH (ref 15–41)
Albumin: 3.6 g/dL (ref 3.5–5.0)
Alkaline Phosphatase: 131 U/L — ABNORMAL HIGH (ref 38–126)
Anion gap: 12 (ref 5–15)
BUN: 5 mg/dL — ABNORMAL LOW (ref 8–23)
CO2: 21 mmol/L — ABNORMAL LOW (ref 22–32)
Calcium: 8.5 mg/dL — ABNORMAL LOW (ref 8.9–10.3)
Chloride: 109 mmol/L (ref 98–111)
Creatinine, Ser: 0.7 mg/dL (ref 0.61–1.24)
GFR calc Af Amer: >60 mL/min (ref >60)
GFR calc non Af Amer: >60 mL/min (ref >60)
Glucose, Bld: 99 mg/dL (ref 70–99)
Potassium: 4.5 mmol/L (ref 3.5–5.1)
Sodium: 142 mmol/L (ref 135–145)
Total Bilirubin: 1.7 mg/dL — ABNORMAL HIGH (ref 0.3–1.2)
Total Protein: 7.3 g/dL (ref 6.5–8.1)

## 2017-08-30 LAB — CBC WITH DIFFERENTIAL/PLATELET
Basophils Absolute: 0.1 10*3/uL (ref 0.0–0.1)
Basophils Relative: 2 %
Eosinophils Absolute: 0 10*3/uL (ref 0.0–0.7)
Eosinophils Relative: 0 %
HCT: 45.6 % (ref 39.0–52.0)
Hemoglobin: 16 g/dL (ref 13.0–17.0)
Lymphocytes Relative: 28 %
Lymphs Abs: 0.8 10*3/uL (ref 0.7–4.0)
MCH: 34.6 pg — ABNORMAL HIGH (ref 26.0–34.0)
MCHC: 35.1 g/dL (ref 30.0–36.0)
MCV: 98.5 fL (ref 78.0–100.0)
Monocytes Absolute: 0.6 10*3/uL (ref 0.1–1.0)
Monocytes Relative: 19 %
Neutro Abs: 1.5 10*3/uL — ABNORMAL LOW (ref 1.7–7.7)
Neutrophils Relative %: 51 %
Platelets: 130 10*3/uL — ABNORMAL LOW (ref 150–400)
RBC: 4.63 MIL/uL (ref 4.22–5.81)
RDW: 13.8 % (ref 11.5–15.5)
WBC: 3 10*3/uL — ABNORMAL LOW (ref 4.0–10.5)

## 2017-08-30 LAB — ETHANOL: Alcohol, Ethyl (B): <10 mg/dL (ref <10)

## 2017-08-30 LAB — RAPID URINE DRUG SCREEN, HOSP PERFORMED
Amphetamines: NOT DETECTED
Benzodiazepines: NOT DETECTED
Cocaine: POSITIVE — AB
Opiates: NOT DETECTED
Tetrahydrocannabinol: NOT DETECTED

## 2017-08-30 LAB — TROPONIN I: Troponin I: <0.03 ng/mL (ref <0.03)

## 2017-08-30 MED ORDER — SODIUM CHLORIDE 0.9 % IV BOLUS
1000.0000 mL | Freq: Once | INTRAVENOUS | Status: AC
  Administered 2017-08-30: 1000 mL via INTRAVENOUS

## 2017-08-30 NOTE — ED Provider Notes (Signed)
[facility] EMERGENCY DEPARTMENT Provider Note   CSN: [id number] Arrival date & time: 08/30/17  1913     History   Chief Complaint Chief Complaint  Patient presents with  . Weakness    HPI Duane Richardson is a 62 y.o. male.  Level 5 caveat for mental health condition.  Patient presents with abdominal pain described as "stomach is boiling".  He also states weakness and generalized shaking.  Past history includes bipolar disorder, alcohol abuse, hypertension, COPD, several others.  Patient has a known history of alcoholism.  No chest pain or dyspnea.  Nursing notes report mucousy stool.     Past Medical History:  Diagnosis Date  . Allergy   . Anxiety   . Arthritis   . Asthma   . Atherosclerosis   . Bipolar 1 disorder ([place])   . Cataract   . COPD (chronic obstructive pulmonary disease) ([place])   . Depression   . Elevated PSA   . ETOH abuse    quit Oct 2013, then relapsed, as of Dec 2013 now has abstained X 1 month  . GERD (gastroesophageal reflux disease)   . Hyperlipidemia   . Hypertension     Patient Active Problem List   Diagnosis Date Noted  . Atherosclerosis   . Acute colitis 12/05/2015  . Syncope 12/05/2015  . Hypokalemia 12/05/2015  . Hypomagnesemia 12/05/2015  . Thrombocytopenia ([place]) 12/05/2015  . Alcohol withdrawal ([place]) 12/04/2015  . Generalized weakness 12/04/2015  . Abdominal pain 12/04/2015  . Nausea & vomiting 12/04/2015  . Arthritis of left hip 06/07/2013  . Status post THR (total hip replacement) 06/07/2013  . BPH (benign prostatic hypertrophy) with urinary obstruction 03/22/2013  . Protein-calorie malnutrition, severe ([place]) 11/30/2012  . Hip fracture ([place]) 11/23/2012  . HTN (hypertension) 11/23/2012  . Steatosis of liver 11/15/2012  . Anorexia nervosa 10/13/2012  . Loss of weight 07/16/2012  . ETOH abuse   . Weight loss 02/17/2012  . Dysphagia 12/30/2011  . Oral candida 12/30/2011  . Rectal bleed 12/30/2011  . Unintentional weight loss  12/30/2011  . Hx of adenomatous colonic polyps 12/30/2011  . Leucopenia 07/08/2007  . COPD, MILD 12/21/2006  . LIVER FUNCTION TESTS, ABNORMAL 11/09/2006  . HYPERLIPIDEMIA 05/27/2006  . DISORDER, BIPOLAR NOS 05/27/2006  . TOBACCO ABUSE 05/27/2006  . Depression 05/27/2006  . CATARACT NOS 05/27/2006  . ALLERGIC RHINITIS 05/27/2006  . ASTHMA 05/27/2006  . GERD 05/27/2006  . ARTHRITIS 05/27/2006  . LOW BACK PAIN, CHRONIC 05/27/2006  . MALAISE AND FATIGUE 05/27/2006    Past Surgical History:  Procedure Laterality Date  . BACK SURGERY     lunbar  . COLONOSCOPY  12/21/09   GYI:RSWN papilla otherwise normal/ pancolonic diverticula/mutiple colonic poylps  . COLONOSCOPY WITH ESOPHAGOGASTRODUODENOSCOPY (EGD) N/A 10/20/2012   Procedure: COLONOSCOPY WITH ESOPHAGOGASTRODUODENOSCOPY (EGD);  Surgeon: Daneil Dolin, MD;  Location: [facility];  Service: Endoscopy;  Laterality: N/A;  10;15  . CYSTOSCOPY N/A 01/31/2013   Procedure: CYSTOSCOPY FLEXIBLE;  Surgeon: Marissa Nestle, MD;  Location: [facility];  Service: Urology;  Laterality: N/A;  I would like to do this around 1 pm on monday.   Marland Kitchen HARDWARE REMOVAL Left 06/07/2013   Procedure: HARDWARE REMOVAL;  Surgeon: Mcarthur Rossetti, MD;  Location: [place];  Service: Orthopedics;  Laterality: Left;  . ORIF HIP FRACTURE Left 11/24/2012   Procedure: OPEN REDUCTION INTERNAL FIXATION HIP;  Surgeon: Sanjuana Kava, MD;  Location: [facility];  Service: Orthopedics;  Laterality: Left;  . SPINE SURGERY    .  TOTAL HIP ARTHROPLASTY Left 06/07/2013   Procedure: REMOVE HARDWARE LEFT HIP AND LEFT TOTAL HIP ARTHROPLASTY ANTERIOR APPROACH;  Surgeon: Mcarthur Rossetti, MD;  Location: [place];  Service: Orthopedics;  Laterality: Left;  . TRANSURETHRAL RESECTION OF PROSTATE N/A 03/22/2013   Procedure: TRANSURETHRAL RESECTION OF THE PROSTATE (TURP);  Surgeon: Marissa Nestle, MD;  Location: [facility];  Service: Urology;  Laterality: N/A;        Home Medications     Prior to Admission medications   Medication Sig Start Date End Date Taking? Authorizing Provider  ALPRAZolam (XANAX) 0.5 MG tablet TAKE (1) TABLET BY MOUTH (3) TIMES DAILY. 12/17/16   Susy Frizzle, MD  amLODipine (NORVASC) 10 MG tablet TAKE ONE TABLET BY MOUTH ONCE DAILY. 08/05/16   Susy Frizzle, MD  aspirin EC 325 MG EC tablet Take 1 tablet (325 mg total) by mouth 2 (two) times daily after a meal. 06/10/13   Mcarthur Rossetti, MD  atorvastatin (LIPITOR) 10 MG tablet Take 1 tablet (10 mg total) by mouth daily. 05/09/16   Susy Frizzle, MD  hydrochlorothiazide (HYDRODIURIL) 25 MG tablet Take 1 tablet (25 mg total) by mouth daily. 05/09/16   Susy Frizzle, MD  PROAIR HFA 108 [phone] Base) MCG/ACT inhaler INHALE 2 PUFFS INTO THE LUNGS EVERY 4 TO 6 HOURS AS NEEDED. 08/28/16   Susy Frizzle, MD  sertraline (ZOLOFT) 50 MG tablet Take 1 tablet (50 mg total) by mouth daily. 12/17/16   Susy Frizzle, MD    Family History Family History  Problem Relation Age of Onset  . Mental illness Sister   . Colon cancer Neg Hx     Social History Social History   Tobacco Use  . Smoking status: Current Every Day Smoker    Packs/day: 0.50    Years: 30.00    Pack years: 15.00    Types: Cigarettes  . Smokeless tobacco: Never Used  Substance Use Topics  . Alcohol use: No    Alcohol/week: 18.0 oz    Types: 30 Glasses of wine per week    Comment: history of  about a liter of wine per day-before that  . Drug use: Yes    Types: Marijuana     Allergies   Patient has no known allergies.   Review of Systems Review of Systems  Unable to perform ROS: Psychiatric disorder     Physical Exam Updated Vital Signs BP (!) 151/100   Pulse 97   Temp 98.9 F (37.2 C) (Oral)   Resp 14   Ht 6\' 2"  (1.88 m)   Wt 65.8 kg (145 lb)   SpO2 100%   BMI 18.62 kg/m   Physical Exam  Constitutional: He is oriented to person, place, and time.  Restless, shaking  HENT:  Head:  Normocephalic and atraumatic.  Eyes: Conjunctivae are normal.  Neck: Neck supple.  Cardiovascular: Normal rate and regular rhythm.  Pulmonary/Chest: Effort normal and breath sounds normal.  Abdominal:  Minimal generalized tenderness.  Musculoskeletal: Normal range of motion.  Neurological: He is alert and oriented to person, place, and time.  Skin: Skin is warm and dry.  Psychiatric: He has a normal mood and affect. His behavior is normal.  Nursing note and vitals reviewed.    ED Treatments / Results  Labs (all labs ordered are listed, but only abnormal results are displayed) Labs Reviewed  CBC WITH DIFFERENTIAL/PLATELET - Abnormal; Notable for the following components:      Result Value  WBC 3.0 (*)    MCH 34.6 (*)    Platelets 130 (*)    Neutro Abs 1.5 (*)    All other components within normal limits  COMPREHENSIVE METABOLIC PANEL - Abnormal; Notable for the following components:   CO2 21 (*)    BUN 5 (*)    Calcium 8.5 (*)    AST 230 (*)    Alkaline Phosphatase 131 (*)    Total Bilirubin 1.7 (*)    All other components within normal limits  RAPID URINE DRUG SCREEN, HOSP PERFORMED - Abnormal; Notable for the following components:   Cocaine POSITIVE (*)    Barbiturates   (*)    Value: Result not available. Reagent lot number recalled by manufacturer.   All other components within normal limits  TROPONIN I  ETHANOL  LIPASE, BLOOD    EKG None  Radiology Ct Head Wo Contrast  Result Date: 08/30/2017 CLINICAL DATA:  Altered mental status, weakness, nausea and vomiting today, tremors, lower abdominal pain for 2 days, history COPD, hypertension, asthma EXAM: CT HEAD WITHOUT CONTRAST TECHNIQUE: Contiguous axial images were obtained from the base of the skull through the vertex without intravenous contrast. Sagittal and coronal MPR images reconstructed from axial data set. COMPARISON:  12/04/2015 FINDINGS: Brain: Generalized atrophy. Normal ventricular morphology. No midline  shift or mass effect. Minimal small vessel chronic ischemic changes of deep cerebral white matter. No intracranial hemorrhage, mass lesion, evidence of acute infarction, or extra-axial fluid collection. Vascular: Minimal atherosclerotic calcification of internal carotid arteries at skull base Skull: Intact Sinuses/Orbits: Visualized paranasal sinuses and mastoid air cells clear Other: N/A IMPRESSION: Atrophy with minimal small vessel chronic ischemic changes of deep cerebral white matter. No acute intracranial abnormalities. Electronically Signed   By: Lavonia Dana M.D.   On: 08/30/2017 22:25    Procedures Procedures (including critical care time)  Medications Ordered in ED Medications  sodium chloride 0.9 % bolus 1,000 mL (1,000 mLs Intravenous New Bag/Given 08/30/17 2203)     Initial Impression / Assessment and Plan / ED Course  I have reviewed the triage vital signs and the nursing notes.  Pertinent labs & imaging results that were available during my care of the patient were reviewed by me and considered in my medical decision making (see chart for details).     Patient presents with weakness, tremulousness, mucus stool.  He is leukopenic.  Liver function slightly elevated, but this is not new.  Drug screen positive for cocaine.  Will hydrate, CT abdomen/pelvis.  Discussed with Dr. Betsey Holiday  Final Clinical Impressions(s) / ED Diagnoses   Final diagnoses:  Weakness  Abdominal pain, unspecified abdominal location    ED Discharge Orders    None       Nat Christen, MD 08/30/17 2350

## 2017-08-30 NOTE — ED Notes (Addendum)
Pt had BM in bed, cleaned pt and changed linens, removed clothing and placed in bag, pt states he feels like his "stomach is boiling", offered to place bedside commode in room for pt, pt declines stating he is unable to get up to bedside commode, EDP made aware

## 2017-08-30 NOTE — ED Triage Notes (Signed)
Pt brought in by [facility] for c/o weakness and abdomina pain; pt has some bilateral tremors; pt has had some alcohol today; cbg 95; pt states he has vomited x 1 today

## 2017-08-30 NOTE — ED Notes (Signed)
Pt called out for BM, pt placed on bed pan, stool is mucous

## 2017-08-30 NOTE — ED Notes (Signed)
Pt to CT at this time.

## 2017-08-31 ENCOUNTER — Emergency Department (HOSPITAL_COMMUNITY): Payer: Medicaid Other

## 2017-08-31 LAB — GASTROINTESTINAL PANEL BY PCR, STOOL (REPLACES STOOL CULTURE)

## 2017-08-31 LAB — LIPASE, BLOOD: Lipase: 37 U/L (ref 11–51)

## 2017-08-31 MED ORDER — CIPROFLOXACIN HCL 250 MG PO TABS
500.0000 mg | ORAL_TABLET | Freq: Once | ORAL | Status: AC
  Administered 2017-08-31: 500 mg via ORAL
  Filled 2017-08-31: qty 2

## 2017-08-31 MED ORDER — IOPAMIDOL (ISOVUE-300) INJECTION 61%
100.0000 mL | Freq: Once | INTRAVENOUS | Status: AC | PRN
  Administered 2017-08-31: 100 mL via INTRAVENOUS

## 2017-08-31 MED ORDER — CIPROFLOXACIN HCL 500 MG PO TABS: 500.0000 mg | ORAL_TABLET | Freq: Two times a day (BID) | ORAL | 0 refills | Status: DC

## 2017-08-31 NOTE — ED Provider Notes (Signed)
Patient signed out to me to follow-up on CT scan.  Patient has been experiencing abdominal pain prompting his presentation to the ER.  Patient sleeping comfortably here in the ER, no complaints.  CT scan has been reviewed, no acute abnormality is noted.  Patient reportedly had mucousy diarrhea here in the ER and there may have been blood in it.  GI pathogen panel has been sent and will empirically be treated with Cipro.  Results for orders placed or performed during the hospital encounter of 08/30/17  CBC with Differential  Result Value Ref Range   WBC 3.0 (L) 4.0 - 10.5 K/uL   RBC 4.63 4.22 - 5.81 MIL/uL   Hemoglobin 16.0 13.0 - 17.0 g/dL   HCT 45.6 39.0 - 52.0 %   MCV 98.5 78.0 - 100.0 fL   MCH 34.6 (H) 26.0 - 34.0 pg   MCHC 35.1 30.0 - 36.0 g/dL   RDW 13.8 11.5 - 15.5 %   Platelets 130 (L) 150 - 400 K/uL   Neutrophils Relative % 51 %   Neutro Abs 1.5 (L) 1.7 - 7.7 K/uL   Lymphocytes Relative 28 %   Lymphs Abs 0.8 0.7 - 4.0 K/uL   Monocytes Relative 19 %   Monocytes Absolute 0.6 0.1 - 1.0 K/uL   Eosinophils Relative 0 %   Eosinophils Absolute 0.0 0.0 - 0.7 K/uL   Basophils Relative 2 %   Basophils Absolute 0.1 0.0 - 0.1 K/uL  Comprehensive metabolic panel  Result Value Ref Range   Sodium 142 135 - 145 mmol/L   Potassium 4.5 3.5 - 5.1 mmol/L   Chloride 109 98 - 111 mmol/L   CO2 21 (L) 22 - 32 mmol/L   Glucose, Bld 99 70 - 99 mg/dL   BUN 5 (L) 8 - 23 mg/dL   Creatinine, Ser 0.70 0.61 - 1.24 mg/dL   Calcium 8.5 (L) 8.9 - 10.3 mg/dL   Total Protein 7.3 6.5 - 8.1 g/dL   Albumin 3.6 3.5 - 5.0 g/dL   AST 230 (H) 15 - 41 U/L   ALT 39 0 - 44 U/L   Alkaline Phosphatase 131 (H) 38 - 126 U/L   Total Bilirubin 1.7 (H) 0.3 - 1.2 mg/dL   GFR calc non Af Amer >60 >60 mL/min   GFR calc Af Amer >60 >60 mL/min   Anion gap 12 5 - 15  Troponin I  Result Value Ref Range   Troponin I <0.03 <0.03 ng/mL  Ethanol  Result Value Ref Range   Alcohol, Ethyl (B) <10 <10 mg/dL  Urine rapid drug  screen (hosp performed)  Result Value Ref Range   Opiates NONE DETECTED NONE DETECTED   Cocaine POSITIVE (A) NONE DETECTED   Benzodiazepines NONE DETECTED NONE DETECTED   Amphetamines NONE DETECTED NONE DETECTED   Tetrahydrocannabinol NONE DETECTED NONE DETECTED   Barbiturates (A) NONE DETECTED    Result not available. Reagent lot number recalled by manufacturer.  Lipase, blood  Result Value Ref Range   Lipase 37 11 - 51 U/L   Ct Head Wo Contrast  Result Date: 08/30/2017 CLINICAL DATA:  Altered mental status, weakness, nausea and vomiting today, tremors, lower abdominal pain for 2 days, history COPD, hypertension, asthma EXAM: CT HEAD WITHOUT CONTRAST TECHNIQUE: Contiguous axial images were obtained from the base of the skull through the vertex without intravenous contrast. Sagittal and coronal MPR images reconstructed from axial data set. COMPARISON:  12/04/2015 FINDINGS: Brain: Generalized atrophy. Normal ventricular morphology. No midline shift or  mass effect. Minimal small vessel chronic ischemic changes of deep cerebral white matter. No intracranial hemorrhage, mass lesion, evidence of acute infarction, or extra-axial fluid collection. Vascular: Minimal atherosclerotic calcification of internal carotid arteries at skull base Skull: Intact Sinuses/Orbits: Visualized paranasal sinuses and mastoid air cells clear Other: N/A IMPRESSION: Atrophy with minimal small vessel chronic ischemic changes of deep cerebral white matter. No acute intracranial abnormalities. Electronically Signed   By: Lavonia Dana M.D.   On: 08/30/2017 22:25   Ct Abdomen Pelvis W Contrast  Result Date: 08/31/2017 CLINICAL DATA:  Acute abdominal pain.  Mucus in stool. EXAM: CT ABDOMEN AND PELVIS WITH CONTRAST TECHNIQUE: Multidetector CT imaging of the abdomen and pelvis was performed using the standard protocol following bolus administration of intravenous contrast. CONTRAST:  120mL ISOVUE-300 IOPAMIDOL (ISOVUE-300) INJECTION 61%  COMPARISON:  12/04/2015 FINDINGS: Lower chest: Subpleural scarring in the right lower lobe, likely with superimposed atelectasis. Hepatobiliary: Hepatic steatosis.No evidence of biliary obstruction or stone. Pancreas: Generalized atrophy. Spleen: Unremarkable. Adrenals/Urinary Tract: Negative adrenals. No hydronephrosis or visible ureteral stone (the distal left ureter is obscured by streak artifact). 6 mm left lower pole nephrolithiasis. Unremarkable bladder. Stomach/Bowel: No obstruction. No appendicitis. No visible colitis. Vascular/Lymphatic: No acute vascular abnormality. Extensive atherosclerosis. No mass or adenopathy. Reproductive:Enlarged, heterogeneous prostate. Other: No ascites or pneumoperitoneum. Musculoskeletal: Remote proximal left femoral fracture with arthroplasty. Bulky heterotopic ossification impinges on the left femoral vessels. Remote bilateral rib fractures. Remote right transverse process fractures in the lumbar spine. IMPRESSION: 1. No acute finding. 2.  Aortic Atherosclerosis (ICD10-I70.0). 3. Hepatic steatosis. 4. Left nephrolithiasis. Electronically Signed   By: Monte Fantasia M.D.   On: 08/31/2017 01:00      Orpah Greek, MD 08/31/17 [place] [place]

## 2017-10-05 ENCOUNTER — Encounter: Payer: Self-pay | Admitting: Internal Medicine

## 2017-12-25 ENCOUNTER — Other Ambulatory Visit (HOSPITAL_COMMUNITY): Payer: Self-pay | Admitting: Internal Medicine

## 2017-12-25 ENCOUNTER — Ambulatory Visit (HOSPITAL_COMMUNITY)
Admission: RE | Admit: 2017-12-25 | Discharge: 2017-12-25 | Disposition: A | Discharge status: Home / Self Care | Payer: Medicaid Other | Source: Ambulatory Visit | Attending: Internal Medicine | Admitting: Internal Medicine

## 2017-12-25 ENCOUNTER — Encounter (HOSPITAL_COMMUNITY): Payer: Self-pay

## 2017-12-25 DIAGNOSIS — J449 Chronic obstructive pulmonary disease, unspecified: Secondary | ICD-10-CM

## 2017-12-28 ENCOUNTER — Ambulatory Visit (HOSPITAL_COMMUNITY)
Admission: RE | Admit: 2017-12-28 | Discharge: 2017-12-28 | Disposition: A | Discharge status: Home / Self Care | Payer: Medicaid Other | Source: Ambulatory Visit | Attending: Internal Medicine | Admitting: Internal Medicine

## 2017-12-28 DIAGNOSIS — J449 Chronic obstructive pulmonary disease, unspecified: Secondary | ICD-10-CM | POA: Diagnosis not present

## 2018-03-04 ENCOUNTER — Other Ambulatory Visit: Payer: Self-pay | Admitting: Family Medicine

## 2018-08-14 ENCOUNTER — Encounter (HOSPITAL_COMMUNITY): Payer: Self-pay | Admitting: Emergency Medicine

## 2018-08-14 ENCOUNTER — Emergency Department (HOSPITAL_COMMUNITY): Payer: Medicare Other

## 2018-08-14 ENCOUNTER — Other Ambulatory Visit: Payer: Self-pay

## 2018-08-14 ENCOUNTER — Emergency Department (HOSPITAL_COMMUNITY)
Admission: EM | Admit: 2018-08-14 | Discharge: 2018-08-14 | Disposition: A | Discharge status: Home / Self Care | Payer: Medicare Other | Attending: Emergency Medicine | Admitting: Emergency Medicine

## 2018-08-14 DIAGNOSIS — M79672 Pain in left foot: Secondary | ICD-10-CM | POA: Insufficient documentation

## 2018-08-14 DIAGNOSIS — K529 Noninfective gastroenteritis and colitis, unspecified: Secondary | ICD-10-CM | POA: Insufficient documentation

## 2018-08-14 DIAGNOSIS — F17210 Nicotine dependence, cigarettes, uncomplicated: Secondary | ICD-10-CM | POA: Diagnosis not present

## 2018-08-14 DIAGNOSIS — S80212A Abrasion, left knee, initial encounter: Secondary | ICD-10-CM | POA: Diagnosis not present

## 2018-08-14 DIAGNOSIS — J449 Chronic obstructive pulmonary disease, unspecified: Secondary | ICD-10-CM | POA: Insufficient documentation

## 2018-08-14 DIAGNOSIS — N390 Urinary tract infection, site not specified: Secondary | ICD-10-CM | POA: Diagnosis not present

## 2018-08-14 DIAGNOSIS — Z7982 Long term (current) use of aspirin: Secondary | ICD-10-CM | POA: Diagnosis not present

## 2018-08-14 DIAGNOSIS — I10 Essential (primary) hypertension: Secondary | ICD-10-CM | POA: Insufficient documentation

## 2018-08-14 DIAGNOSIS — Z79899 Other long term (current) drug therapy: Secondary | ICD-10-CM | POA: Insufficient documentation

## 2018-08-14 DIAGNOSIS — W19XXXA Unspecified fall, initial encounter: Secondary | ICD-10-CM | POA: Insufficient documentation

## 2018-08-14 DIAGNOSIS — Y929 Unspecified place or not applicable: Secondary | ICD-10-CM | POA: Diagnosis not present

## 2018-08-14 DIAGNOSIS — J45909 Unspecified asthma, uncomplicated: Secondary | ICD-10-CM | POA: Diagnosis not present

## 2018-08-14 DIAGNOSIS — R1030 Lower abdominal pain, unspecified: Secondary | ICD-10-CM | POA: Diagnosis present

## 2018-08-14 DIAGNOSIS — Y939 Activity, unspecified: Secondary | ICD-10-CM | POA: Insufficient documentation

## 2018-08-14 DIAGNOSIS — Y999 Unspecified external cause status: Secondary | ICD-10-CM | POA: Insufficient documentation

## 2018-08-14 LAB — URINALYSIS, ROUTINE W REFLEX MICROSCOPIC
Bilirubin Urine: NEGATIVE
Glucose, UA: NEGATIVE mg/dL
Hgb urine dipstick: NEGATIVE
Ketones, ur: NEGATIVE mg/dL
Nitrite: NEGATIVE
Protein, ur: NEGATIVE mg/dL
Specific Gravity, Urine: >1.046 — ABNORMAL HIGH (ref 1.005–1.030)
WBC, UA: >50 WBC/hpf — ABNORMAL HIGH (ref 0–5)
pH: 6 (ref 5.0–8.0)

## 2018-08-14 LAB — COMPREHENSIVE METABOLIC PANEL
ALT: 23 U/L (ref 0–44)
AST: 66 U/L — ABNORMAL HIGH (ref 15–41)
Albumin: 3.6 g/dL (ref 3.5–5.0)
Alkaline Phosphatase: 118 U/L (ref 38–126)
Anion gap: 10 (ref 5–15)
BUN: 8 mg/dL (ref 8–23)
CO2: 23 mmol/L (ref 22–32)
Calcium: 8.6 mg/dL — ABNORMAL LOW (ref 8.9–10.3)
Chloride: 100 mmol/L (ref 98–111)
Creatinine, Ser: 0.74 mg/dL (ref 0.61–1.24)
GFR calc Af Amer: >60 mL/min (ref >60)
GFR calc non Af Amer: >60 mL/min (ref >60)
Glucose, Bld: 116 mg/dL — ABNORMAL HIGH (ref 70–99)
Potassium: 3.6 mmol/L (ref 3.5–5.1)
Sodium: 133 mmol/L — ABNORMAL LOW (ref 135–145)
Total Bilirubin: 1.9 mg/dL — ABNORMAL HIGH (ref 0.3–1.2)
Total Protein: 7.2 g/dL (ref 6.5–8.1)

## 2018-08-14 LAB — CBC
HCT: 42.7 % (ref 39.0–52.0)
Hemoglobin: 14.8 g/dL (ref 13.0–17.0)
MCH: 32.6 pg (ref 26.0–34.0)
MCHC: 34.7 g/dL (ref 30.0–36.0)
MCV: 94.1 fL (ref 80.0–100.0)
Platelets: 122 10*3/uL — ABNORMAL LOW (ref 150–400)
RBC: 4.54 MIL/uL (ref 4.22–5.81)
RDW: 13.4 % (ref 11.5–15.5)
WBC: 3.9 10*3/uL — ABNORMAL LOW (ref 4.0–10.5)
nRBC: 0 % (ref 0.0–0.2)

## 2018-08-14 LAB — LIPASE, BLOOD: Lipase: 37 U/L (ref 11–51)

## 2018-08-14 LAB — POC OCCULT BLOOD, ED: Fecal Occult Bld: NEGATIVE

## 2018-08-14 MED ORDER — CIPROFLOXACIN HCL 500 MG PO TABS: 500.0000 mg | ORAL_TABLET | Freq: Two times a day (BID) | ORAL | 0 refills | Status: DC

## 2018-08-14 MED ORDER — CIPROFLOXACIN HCL 250 MG PO TABS
500.0000 mg | ORAL_TABLET | Freq: Once | ORAL | Status: AC
  Administered 2018-08-14: 500 mg via ORAL
  Filled 2018-08-14: qty 2

## 2018-08-14 MED ORDER — METRONIDAZOLE 500 MG PO TABS: 500.0000 mg | ORAL_TABLET | Freq: Two times a day (BID) | ORAL | 0 refills | Status: DC

## 2018-08-14 MED ORDER — IOHEXOL 300 MG/ML  SOLN
100.0000 mL | Freq: Once | INTRAMUSCULAR | Status: AC | PRN
  Administered 2018-08-14: 100 mL via INTRAVENOUS

## 2018-08-14 MED ORDER — SODIUM CHLORIDE 0.9% FLUSH
3.0000 mL | Freq: Once | INTRAVENOUS | Status: AC
  Administered 2018-08-14: 3 mL via INTRAVENOUS

## 2018-08-14 NOTE — Discharge Instructions (Addendum)
Take the antibiotic as directed until finished.  Call your family doctor to arrange a follow-up appt in 1-2 weeks.  Return here for any worsening symptoms such as fever, persistent vomiting or increasing abdominal pain

## 2018-08-14 NOTE — ED Provider Notes (Signed)
[facility] EMERGENCY DEPARTMENT Provider Note   CSN: [id number] Arrival date & time: 08/14/18  1044     History   Chief Complaint Chief Complaint  Patient presents with   Abdominal Pain    HPI Duane Richardson is a 63 y.o. male.     HPI   Duane Richardson is a 63 y.o. male who presents to the Emergency Department complaining of lower abdominal pain, nausea and intermittent vomiting and loose watery stool.  Symptoms have been present for several days, but worse today.  He reports having dark-colored vomitus and he is concerned that there was blood in it.  He also reports having bright red blood in his stools.  He describes the abdominal pain as constant and sharp at times.  He denies fever, chest pain, shortness of breath, he also admits to having burning and frequency with urination.  No back pain.  He also reports history of a fall this morning in which he injured his left foot and left knee.  States he is able to ambulate but has pain with weightbearing.  He denies head injury, LOC, dizziness, headache hip and back pain.    Past Medical History:  Diagnosis Date   Allergy    Anxiety    Arthritis    Asthma    Atherosclerosis    Bipolar 1 disorder (HCC)    Cataract    COPD (chronic obstructive pulmonary disease) ([place])    Depression    Elevated PSA    ETOH abuse    quit Oct 2013, then relapsed, as of Dec 2013 now has abstained X 1 month   GERD (gastroesophageal reflux disease)    Hyperlipidemia    Hypertension     Patient Active Problem List   Diagnosis Date Noted   Atherosclerosis    Acute colitis 12/05/2015   Syncope 12/05/2015   Hypokalemia 12/05/2015   Hypomagnesemia 12/05/2015   Thrombocytopenia ([place]) 12/05/2015   Alcohol withdrawal ([place]) 12/04/2015   Generalized weakness 12/04/2015   Abdominal pain 12/04/2015   Nausea & vomiting 12/04/2015   Arthritis of left hip 06/07/2013   Status post THR (total hip replacement) 06/07/2013    BPH (benign prostatic hypertrophy) with urinary obstruction 03/22/2013   Protein-calorie malnutrition, severe ([place]) 11/30/2012   Hip fracture ([place]) 11/23/2012   HTN (hypertension) 11/23/2012   Steatosis of liver 11/15/2012   Anorexia nervosa 10/13/2012   Loss of weight 07/16/2012   ETOH abuse    Weight loss 02/17/2012   Dysphagia 12/30/2011   Oral candida 12/30/2011   Rectal bleed 12/30/2011   Unintentional weight loss 12/30/2011   Hx of adenomatous colonic polyps 12/30/2011   Leucopenia 07/08/2007   COPD, MILD 12/21/2006   LIVER FUNCTION TESTS, ABNORMAL 11/09/2006   HYPERLIPIDEMIA 05/27/2006   DISORDER, BIPOLAR NOS 05/27/2006   TOBACCO ABUSE 05/27/2006   Depression 05/27/2006   CATARACT NOS 05/27/2006   ALLERGIC RHINITIS 05/27/2006   ASTHMA 05/27/2006   GERD 05/27/2006   ARTHRITIS 05/27/2006   LOW BACK PAIN, CHRONIC 05/27/2006   MALAISE AND FATIGUE 05/27/2006    Past Surgical History:  Procedure Laterality Date   BACK SURGERY     lunbar   COLONOSCOPY  12/21/09   YJE:HUDJ papilla otherwise normal/ pancolonic diverticula/mutiple colonic poylps   COLONOSCOPY WITH ESOPHAGOGASTRODUODENOSCOPY (EGD) N/A 10/20/2012   Procedure: COLONOSCOPY WITH ESOPHAGOGASTRODUODENOSCOPY (EGD);  Surgeon: Daneil Dolin, MD;  Location: [facility];  Service: Endoscopy;  Laterality: N/A;  10;15   CYSTOSCOPY N/A 01/31/2013   Procedure:  CYSTOSCOPY FLEXIBLE;  Surgeon: Marissa Nestle, MD;  Location: [facility];  Service: Urology;  Laterality: N/A;  I would like to do this around 1 pm on monday.    HARDWARE REMOVAL Left 06/07/2013   Procedure: HARDWARE REMOVAL;  Surgeon: Mcarthur Rossetti, MD;  Location: [place];  Service: Orthopedics;  Laterality: Left;   ORIF HIP FRACTURE Left 11/24/2012   Procedure: OPEN REDUCTION INTERNAL FIXATION HIP;  Surgeon: Sanjuana Kava, MD;  Location: [facility];  Service: Orthopedics;  Laterality: Left;   SPINE SURGERY     TOTAL HIP  ARTHROPLASTY Left 06/07/2013   Procedure: REMOVE HARDWARE LEFT HIP AND LEFT TOTAL HIP ARTHROPLASTY ANTERIOR APPROACH;  Surgeon: Mcarthur Rossetti, MD;  Location: [place];  Service: Orthopedics;  Laterality: Left;   TRANSURETHRAL RESECTION OF PROSTATE N/A 03/22/2013   Procedure: TRANSURETHRAL RESECTION OF THE PROSTATE (TURP);  Surgeon: Marissa Nestle, MD;  Location: [facility];  Service: Urology;  Laterality: N/A;      Home Medications    Prior to Admission medications   Medication Sig Start Date End Date Taking? Authorizing Provider  ALPRAZolam (XANAX) 0.5 MG tablet TAKE (1) TABLET BY MOUTH (3) TIMES DAILY. Patient taking differently: Take 0.5 mg by mouth 3 (three) times daily as needed.  12/17/16  Yes Susy Frizzle, MD  amLODipine (NORVASC) 10 MG tablet TAKE ONE TABLET BY MOUTH ONCE DAILY. 08/05/16  Yes Susy Frizzle, MD  aspirin EC 325 MG EC tablet Take 1 tablet (325 mg total) by mouth 2 (two) times daily after a meal. 06/10/13  Yes Mcarthur Rossetti, MD  Carboxymethylcellul-Glycerin (CLEAR EYES FOR DRY EYES) 1-0.25 % SOLN Apply 1-2 drops to eye 2 (two) times daily as needed.   Yes [provider]  PROAIR HFA 108 (90 Base) MCG/ACT inhaler INHALE 2 PUFFS INTO THE LUNGS EVERY 4 TO 6 HOURS AS NEEDED. 08/28/16  Yes Susy Frizzle, MD    Family History Family History  Problem Relation Age of Onset   Mental illness Sister    Colon cancer Neg Hx     Social History Social History   Tobacco Use   Smoking status: Current Every Day Smoker    Packs/day: 0.50    Years: 30.00    Pack years: 15.00    Types: Cigarettes   Smokeless tobacco: Never Used  Substance Use Topics   Alcohol use: No    Alcohol/week: 30.0 standard drinks    Types: 30 Glasses of wine per week    Comment: history of  about a liter of wine per day-before that   Drug use: Not Currently    Types: Marijuana    Comment: Per patient "years ago" 6/20     Allergies   Patient has no known  allergies.   Review of Systems Review of Systems  Constitutional: Negative for appetite change, chills and fever.  Eyes: Negative for visual disturbance.  Respiratory: Negative for shortness of breath.   Cardiovascular: Negative for chest pain.  Gastrointestinal: Positive for abdominal pain, blood in stool, diarrhea, nausea and vomiting. Negative for abdominal distention.  Genitourinary: Positive for dysuria and frequency. Negative for decreased urine volume, difficulty urinating, flank pain, penile pain, penile swelling, scrotal swelling and testicular pain.  Musculoskeletal: Positive for arthralgias (Left foot and knee pain). Negative for back pain and neck pain.  Skin: Negative for color change and rash.  Neurological: Negative for dizziness, syncope, weakness, numbness and headaches.  Hematological: Negative for adenopathy.     Physical  Exam Updated Vital Signs BP (!) 151/97    Pulse (!) 58    Temp 98.1 F (36.7 C) (Oral)    Resp 13    Ht 5\' 8"  (1.727 m)    Wt 68 kg    SpO2 (!) 86%    BMI 22.81 kg/m   Physical Exam Vitals signs and nursing note reviewed. Exam conducted with a chaperone present.  Constitutional:      General: He is not in acute distress.    Appearance: Normal appearance. He is well-developed. He is not ill-appearing.  HENT:     Head: Atraumatic.     Mouth/Throat:     Mouth: Mucous membranes are moist.  Eyes:     General: No scleral icterus.    Pupils: Pupils are equal, round, and reactive to light.  Neck:     Musculoskeletal: Normal range of motion.  Cardiovascular:     Rate and Rhythm: Normal rate and regular rhythm.     Pulses: Normal pulses.  Pulmonary:     Effort: Pulmonary effort is normal. No respiratory distress.     Breath sounds: Normal breath sounds.  Abdominal:     General: Bowel sounds are normal. There is no distension.     Palpations: Abdomen is soft. There is no mass.     Tenderness: There is abdominal tenderness. There is no right CVA  tenderness, left CVA tenderness, guarding or rebound. Negative signs include McBurney's sign.     Comments: Diffuse ttp of the lower abdomen.  No guarding or rebound tenderness.  No abdominal distention  Genitourinary:    Rectum: Guaiac result negative. No tenderness. Normal anal tone.     Comments: DRE exam performed by me.  Yellow colored stool that was guaiac neg.  Prostate feels enlarged.  No palpable rectal masses.   Musculoskeletal: Normal range of motion.        General: Tenderness present.     Right lower leg: No edema.     Left lower leg: No edema.     Comments: Mild tenderness on range of motion of the left knee.  No edema or effusion.  Small abrasion is present over the patella.  No bony deformity noted.  Compartments are soft.  Mild tenderness to the dorsal left foot without bony deformity, edema, or abrasion.  Left ankle and hip are nontender.  Skin:    General: Skin is warm.     Capillary Refill: Capillary refill takes less than 2 seconds.     Findings: No rash.  Neurological:     Mental Status: He is alert and oriented to person, place, and time.     Motor: No abnormal muscle tone.     Coordination: Coordination normal.      ED Treatments / Results  Labs (all labs ordered are listed, but only abnormal results are displayed) Labs Reviewed  COMPREHENSIVE METABOLIC PANEL - Abnormal; Notable for the following components:      Result Value   Sodium 133 (*)    Glucose, Bld 116 (*)    Calcium 8.6 (*)    AST 66 (*)    Total Bilirubin 1.9 (*)    All other components within normal limits  CBC - Abnormal; Notable for the following components:   WBC 3.9 (*)    Platelets 122 (*)    All other components within normal limits  URINALYSIS, ROUTINE W REFLEX MICROSCOPIC - Abnormal; Notable for the following components:   Color, Urine AMBER (*)  APPearance CLOUDY (*)    Specific Gravity, Urine >1.046 (*)    Leukocytes,Ua LARGE (*)    WBC, UA >50 (*)    Bacteria, UA FEW (*)      All other components within normal limits  URINE CULTURE  LIPASE, BLOOD  POC OCCULT BLOOD, ED    EKG EKG Interpretation  Date/Time:  Saturday August 14 2018 11:45:25 EDT Ventricular Rate:  85 PR Interval:    QRS Duration: 99 QT Interval:  431 QTC Calculation: 513 R Axis:   33 Text Interpretation:  Sinus rhythm Low voltage, extremity leads Minimal ST elevation, inferior leads Prolonged QT interval Confirmed by Fredia Sorrow [phone]) on 08/14/2018 3:28:29 PM   Radiology Ct Chest W Contrast  Result Date: 08/14/2018 CLINICAL DATA:  Right lower abdominal pain and nausea as well as vomiting and diarrhea since yesterday. Vomiting blood this morning. Also bloody stools. Fall this morning. EXAM: CT CHEST, ABDOMEN, AND PELVIS WITH CONTRAST TECHNIQUE: Multidetector CT imaging of the chest, abdomen and pelvis was performed following the standard protocol during bolus administration of intravenous contrast. CONTRAST:  170mL OMNIPAQUE IOHEXOL 300 MG/ML  SOLN COMPARISON:  Abdominal/pelvic CT 08/31/2017, chest x-ray 12/28/2017. FINDINGS: CT CHEST FINDINGS Cardiovascular: Heart is normal size. Thoracic aorta is unremarkable. Visualized pulmonary arterial system is within normal. Remaining vascular structures are unremarkable. Mediastinum/Nodes: No mediastinal or hilar adenopathy. Remaining mediastinal structures are unremarkable. Lungs/Pleura: Lungs are well inflated and demonstrate minimal emphysematous disease. Mild peripheral increased interstitial markings over the anterior right middle lobe and posterior right lower lobe. No acute lobar consolidation. No effusion. Possible aspirate debris along the dependent portion of the right mainstem bronchus. Airways are otherwise unremarkable. Musculoskeletal: Multiple old bilateral rib fractures. Mild degenerate change of the spine. Moderate compression fracture of T7 age indeterminate but new since November 2019. CT ABDOMEN PELVIS FINDINGS Hepatobiliary: Mild  diffuse low-attenuation of the liver without focal mass. Gallbladder and biliary tree are normal. Pancreas: Normal. Spleen: Normal. Adrenals/Urinary Tract: Adrenal glands are normal. Kidneys are normal size without hydronephrosis. No focal mass. 7 mm stone over the lower pole collecting system of the left kidney. Visualized ureters and bladder are normal. Distal left ureter obscured by moderate streak artifact from hip prosthesis. Stomach/Bowel: Stomach and small bowel are normal. Appendix is normal. There is mild circumferential wall thickening of a very short segment of proximal descending colon in the left mid abdomen without evidence of mass or adjacent adenopathy. No adjacent free fluid or inflammatory change. Vascular/Lymphatic: Moderate calcified plaque of the abdominal aorta and iliac arteries. No adenopathy. Reproductive: Minimal prominence of the prostate gland. Other: No significant free fluid or focal inflammatory change. Musculoskeletal: Stable L1 compression fracture. Mild spondylosis of the spine. Degenerative change of the right hip. Left hip arthroplasty. IMPRESSION: No acute findings in the chest. Short segment circumferential wall thickening of the proximal descending colon. No adjacent free fluid or inflammatory change. No adjacent adenopathy. Findings could be seen with mild acute colitis, although neoplastic involvement is possible. Consider follow-up CT 3-4 weeks. Peripheral interstitial changes over the right middle and lower lobes. 7 mm nonobstructing left renal stone. Hepatic steatosis without focal mass. Aortic Atherosclerosis (ICD10-I70.0) and Emphysema (ICD10-J43.9). Moderate T7 compression fracture age indeterminate but new since November 2019. Stable mild L1 compression fracture. Electronically Signed   By: Marin Olp M.D.   On: 08/14/2018 13:43   Ct Abdomen Pelvis W Contrast  Result Date: 08/14/2018 CLINICAL DATA:  Right lower abdominal pain and nausea as well as vomiting and  diarrhea since yesterday. Vomiting blood this morning. Also bloody stools. Fall this morning. EXAM: CT CHEST, ABDOMEN, AND PELVIS WITH CONTRAST TECHNIQUE: Multidetector CT imaging of the chest, abdomen and pelvis was performed following the standard protocol during bolus administration of intravenous contrast. CONTRAST:  126mL OMNIPAQUE IOHEXOL 300 MG/ML  SOLN COMPARISON:  Abdominal/pelvic CT 08/31/2017, chest x-ray 12/28/2017. FINDINGS: CT CHEST FINDINGS Cardiovascular: Heart is normal size. Thoracic aorta is unremarkable. Visualized pulmonary arterial system is within normal. Remaining vascular structures are unremarkable. Mediastinum/Nodes: No mediastinal or hilar adenopathy. Remaining mediastinal structures are unremarkable. Lungs/Pleura: Lungs are well inflated and demonstrate minimal emphysematous disease. Mild peripheral increased interstitial markings over the anterior right middle lobe and posterior right lower lobe. No acute lobar consolidation. No effusion. Possible aspirate debris along the dependent portion of the right mainstem bronchus. Airways are otherwise unremarkable. Musculoskeletal: Multiple old bilateral rib fractures. Mild degenerate change of the spine. Moderate compression fracture of T7 age indeterminate but new since November 2019. CT ABDOMEN PELVIS FINDINGS Hepatobiliary: Mild diffuse low-attenuation of the liver without focal mass. Gallbladder and biliary tree are normal. Pancreas: Normal. Spleen: Normal. Adrenals/Urinary Tract: Adrenal glands are normal. Kidneys are normal size without hydronephrosis. No focal mass. 7 mm stone over the lower pole collecting system of the left kidney. Visualized ureters and bladder are normal. Distal left ureter obscured by moderate streak artifact from hip prosthesis. Stomach/Bowel: Stomach and small bowel are normal. Appendix is normal. There is mild circumferential wall thickening of a very short segment of proximal descending colon in the left mid  abdomen without evidence of mass or adjacent adenopathy. No adjacent free fluid or inflammatory change. Vascular/Lymphatic: Moderate calcified plaque of the abdominal aorta and iliac arteries. No adenopathy. Reproductive: Minimal prominence of the prostate gland. Other: No significant free fluid or focal inflammatory change. Musculoskeletal: Stable L1 compression fracture. Mild spondylosis of the spine. Degenerative change of the right hip. Left hip arthroplasty. IMPRESSION: No acute findings in the chest. Short segment circumferential wall thickening of the proximal descending colon. No adjacent free fluid or inflammatory change. No adjacent adenopathy. Findings could be seen with mild acute colitis, although neoplastic involvement is possible. Consider follow-up CT 3-4 weeks. Peripheral interstitial changes over the right middle and lower lobes. 7 mm nonobstructing left renal stone. Hepatic steatosis without focal mass. Aortic Atherosclerosis (ICD10-I70.0) and Emphysema (ICD10-J43.9). Moderate T7 compression fracture age indeterminate but new since November 2019. Stable mild L1 compression fracture. Electronically Signed   By: Marin Olp M.D.   On: 08/14/2018 13:43   Dg Knee Complete 4 Views Left  Result Date: 08/14/2018 CLINICAL DATA:  Patient fell on cement last evening, now with abrasions about the anterior aspect the left knee. EXAM: LEFT KNEE - COMPLETE 4+ VIEW COMPARISON:  None. FINDINGS: Old/healed fracture involving the proximal fibula. No acute fracture or dislocation. Suspected small knee joint effusion. No evidence of lipohemarthrosis. Mild degenerative change of the knee with joint space loss, subchondral sclerosis and osteophytosis. Scattered adjacent vascular calcifications. No radiopaque foreign body. IMPRESSION: 1. Small knee joint effusion.  Otherwise, no acute findings. 2. Old/healed fracture involving the proximal fibula. Electronically Signed   By: Sandi Mariscal M.D.   On: 08/14/2018 13:38    Dg Foot Complete Left  Result Date: 08/14/2018 CLINICAL DATA:  Patient fell on cement last evening, now with left foot pain. EXAM: LEFT FOOT - COMPLETE 3+ VIEW COMPARISON:  None. FINDINGS: Old/healed fracture involving the second metatarsal. Hammertoe deformities are noted involving the second through fifth digits.  No definite acute fracture or dislocation. Moderate hallux valgus deformity. Mild degenerative change of the first MTP joint with joint space loss, subchondral sclerosis and osteophytosis. No definite erosions. Tiny plantar calcaneal spur. Regional soft tissues appear normal. No radiopaque foreign body. IMPRESSION: 1. No definite acute findings. 2. Old/healed fracture involving the second metatarsal. 3. Moderate hallux valgus deformity with mild degenerative change of the first MTP joint. Electronically Signed   By: Sandi Mariscal M.D.   On: 08/14/2018 13:42    Procedures Procedures (including critical care time)  Medications Ordered in ED Medications  sodium chloride flush (NS) 0.9 % injection 3 mL (3 mLs Intravenous Given 08/14/18 1116)  iohexol (OMNIPAQUE) 300 MG/ML solution 100 mL (100 mLs Intravenous Contrast Given 08/14/18 1226)     Initial Impression / Assessment and Plan / ED Course  I have reviewed the triage vital signs and the nursing notes.  Pertinent labs & imaging results that were available during my care of the patient were reviewed by me and considered in my medical decision making (see chart for details).       Patient with a lower abdominal pain, he is well-appearing and nontoxic.  Extremity pain secondary to a fall this morning.  No evidence of fx of dislocation.  Vital signs are reassuring.  Afebrile.  No vomiting here.  Will obtain labs and CT abdomen pelvis.  On recheck, patient reports feeling better.  He requested food here and tolerated well.  Abdomen remains soft with mild tenderness of the lower abdomen.  No guarding or rebound.  He remains afebrile.  CT  scan shows likely acute colitis and urinalysis shows UTI.  No fever or CVA tenderness to suggest pyelonephritis. Labs show leukopenia and elevated bili, but this appears to be stable findings.  I have discussed findings with Dr. Roderic Palau and patient was also evaluated by Dr. Roderic Palau.  Feel that he is appropriate for discharge home, will treat with Cipro and Flagyl, urine culture is pending.  Discussed need for repeat CT in 3-4 weeks as suggested by radiology.  Patient agrees to this plan as well as close outpatient follow-up.  Strict return precautions were also discussed.    Final Clinical Impressions(s) / ED Diagnoses   Final diagnoses:  Colitis, acute  Urinary tract infection in male    ED Discharge Orders    None       Kem Parkinson, PA-C 08/14/18 1631    Milton Ferguson, MD 08/14/18 [id number]

## 2018-08-14 NOTE — ED Triage Notes (Addendum)
Patient brought in via EMS from home. Alert and oriented. Patient c/o right lower abd pain with nausea, vomiting, and diarrhea since yesterday. Denies any fevers. Unable to tell me if he has any urinary symptoms. Patient reports vomiting blood this morning x2 (bright red blood.) Patient also reports blood in stool as well (bright red). Patient also c/o left foot and knee pain. Per patient fell this morning, landing on knee and foot, on concrete porch.

## 2018-08-16 LAB — URINE CULTURE

## 2018-10-21 ENCOUNTER — Ambulatory Visit (HOSPITAL_COMMUNITY): Payer: Medicare Other | Attending: Internal Medicine | Admitting: Occupational Therapy

## 2018-10-21 ENCOUNTER — Other Ambulatory Visit: Payer: Self-pay

## 2018-10-21 ENCOUNTER — Encounter (HOSPITAL_COMMUNITY): Payer: Self-pay | Admitting: Occupational Therapy

## 2018-10-21 DIAGNOSIS — R29898 Other symptoms and signs involving the musculoskeletal system: Secondary | ICD-10-CM | POA: Insufficient documentation

## 2018-10-21 DIAGNOSIS — M79605 Pain in left leg: Secondary | ICD-10-CM | POA: Insufficient documentation

## 2018-10-21 NOTE — Therapy (Addendum)
[place], [place], [address] Phone: [phone]   Fax:  [phone]  Occupational Therapy Wheelchair Evaluation  Patient Details  Name: Duane Richardson MRN: [id number] Date of Birth: [birth year] Referring Provider (OT): Dr. Conni Slipper   Encounter Date: 10/21/2018  OT End of Session - 10/21/18 1041    Visit Number  1    Number of Visits  1    Date for OT Re-Evaluation  10/24/18    Authorization Type  1) Medicare part B 2) Medicaid    OT Start Time  [phone]    OT Stop Time  1000    OT Time Calculation (min)  42 min    Activity Tolerance  Patient tolerated treatment well    Behavior During Therapy  [facility] for tasks assessed/performed       Past Medical History:  Diagnosis Date  . Allergy   . Anxiety   . Arthritis   . Asthma   . Atherosclerosis   . Bipolar 1 disorder ([place])   . Cataract   . COPD (chronic obstructive pulmonary disease) ([place])   . Depression   . Elevated PSA   . ETOH abuse    quit Oct 2013, then relapsed, as of Dec 2013 now has abstained X 1 month  . GERD (gastroesophageal reflux disease)   . Hyperlipidemia   . Hypertension     Past Surgical History:  Procedure Laterality Date  . BACK SURGERY     lunbar  . COLONOSCOPY  12/21/09   EYC:XKGY papilla otherwise normal/ pancolonic diverticula/mutiple colonic poylps  . COLONOSCOPY WITH ESOPHAGOGASTRODUODENOSCOPY (EGD) N/A 10/20/2012   Procedure: COLONOSCOPY WITH ESOPHAGOGASTRODUODENOSCOPY (EGD);  Surgeon: Daneil Dolin, MD;  [facility];  Service: Endoscopy;  Laterality: N/A;  10;15  . CYSTOSCOPY N/A 01/31/2013   Procedure: CYSTOSCOPY FLEXIBLE;  Surgeon: Marissa Nestle, MD;  Location: [facility];  Service: Urology;  Laterality: N/A;  I would like to do this around 1 pm on monday.   Marland Kitchen HARDWARE REMOVAL Left 06/07/2013   Procedure: HARDWARE REMOVAL;  Surgeon: Mcarthur Rossetti, MD;  Location: [place];  Service: Orthopedics;  Laterality:  Left;  . ORIF HIP FRACTURE Left 11/24/2012   Procedure: OPEN REDUCTION INTERNAL FIXATION HIP;  Surgeon: Sanjuana Kava, MD;  Location: [facility];  Service: Orthopedics;  Laterality: Left;  . SPINE SURGERY    . TOTAL HIP ARTHROPLASTY Left 06/07/2013   Procedure: REMOVE HARDWARE LEFT HIP AND LEFT TOTAL HIP ARTHROPLASTY ANTERIOR APPROACH;  Surgeon: Mcarthur Rossetti, MD;  Location: [place];  Service: Orthopedics;  Laterality: Left;  . TRANSURETHRAL RESECTION OF PROSTATE N/A 03/22/2013   Procedure: TRANSURETHRAL RESECTION OF THE PROSTATE (TURP);  Surgeon: Marissa Nestle, MD;  Location: [facility];  Service: Urology;  Laterality: N/A;    There were no vitals filed for this visit.  Subjective Assessment - 10/21/18 0921    Currently in Pain?  Yes    Pain Score  7     Pain Location  Leg    Pain Orientation  Left    Pain Descriptors / Indicators  Aching    Pain Type  Chronic pain    Pain Radiating Towards  hip to foot    Pain Onset  More than a month ago    Pain Frequency  Constant    Aggravating Factors   movement, walking    Pain Relieving Factors  rest    Effect of Pain on Daily Activities  mod to max effect on ADLs    Multiple Pain Sites  No        [facility] OT Assessment - 10/21/18 1055      Assessment   Medical Diagnosis  Wheelchair Evaluation    Referring Provider (OT)  Dr. Conni Slipper      Balance Screen   Has the patient fallen in the past 6 months  Yes    How many times?  3-4x/week    Has the patient had a decrease in activity level because of a fear of falling?   Yes    Is the patient reluctant to leave their home because of a fear of falling?   Yes       Date: 10/21/2018 Patient Name: Duane Richardson Address: [place],  [address] DOB: Mar 31, 1955  To whom it may concern,   Duane Richardson was seen today in this clinic for a wheelchair evaluation for a power chair as his current chair is broken and he is experiencing significant difficulty with ADL completion  and mobility. Duane Richardson has a medical history significant for spinal surgery, ORIF left hip in 2014, left THA in 2015. Chronic pain of 7/10 on average, and weakness have been remaining deficits of his PMH. Duane Richardson walked approximately 20 feet from waiting room to exam room using RW, increased time required for ambulation. Multiple instances of left knee buckling and very slow and labored shuffling gait pattern observed. When standing at sink to wash hands Duane Richardson was unable stand upright, instead standing with hips bent at 90 degrees and leaning on sink. Upon leaving exam room Duane Richardson required use of a wheelchair due to inability to maintain standing without knee buckling.    Duane Richardson lives alone in a single story house with ramp to enter. He has an aide 2x/week who assists with cleaning, cooking, and housework. His brother comes every morning to assist with dressing and laundry, and assists with bathing weekly. Duane Richardson requires significant assistance for tub transfers and reaching LB for dressing and bathing tasks. He reports he is currently unable to participate in grocery shopping due to his Hoveround being broken.  Duane Richardson is currently using his RW for mobility, however is unable to ambulate greater than approximately 25 feet at a time. He reports is unable to ambulate without DME and is severely hindered in accessing bathroom, kitchen, etc. without a working power chair. He owns a Hoveround that he was approved for and received approximately 5 years ago, however it is no longer in working order and he has been unable to use. He has a manual chair that he can use however his BUE fatigue easily and he is unable to push himself greater than 20-30 feet due to BUE pain and fatigue.   Duane Richardson current height is approximately 6 feet, weight is approximately 150#, and oxygen saturation maintained at 96% throughout evaluation. During evaluation Duane Richardson has a difficult time remaining awake,  often requiring verbal cuing to wake back up, twice requiring OT to provide min assist to prevent falling out of chair. Duane Richardson reveals he falls 3-4x/week when he is home alone and needs to transfer or ambulate to bathroom, even with use of RW.   A FULL PHYSICAL ASSESSMENT REVEALS THE FOLLOWING   Existing Equipment: RW, SPC, shower seat, BSC, manual wheelchair     Has a Hoveround but it is broken.    Transfers: Requiring min assist for sit to stand  during evaluation. Requires mod to max          assist for tub transfer.   Head and Neck: ROM limited to approximately 50% with pain present     Trunk: ROM is WFL   Pelvis: ROM is WFL     Hip: ROM is WFL, strength 2/5. Pt was unable to activate hip flexors while seated in a           a chair   Knees: ROM in RLE is WNL, LLE is limited to 75% ROM. Strength: 2+/5   Feet and Ankles: ROM is WFL, strength 2/5-unable to actively dorsiflex ankles while           seated in chair and during ambulation bilateral feet dragging    Upper Extremities: BUE ROM is approximately 75%, strength is 3/5 in BUE with pain.               Grip strength is functional.    Lower Extremities: See above.   Weight Shifting Ability: WFL-able to shift weight by pushing down on arms and hands.                        Although is unable to clear buttocks from chair using BUE                     strength.     Skin Integrity: No history of pressure sores    Cognition: WFL-Duane Richardson is cognitively able to safely operate electric scooter or power chair.     Activity Tolerance: Poor. Pt able to maintain approximately 3 minutes of standing at the               maximum.   GOALS/OBJECTIVE OF SEATING INTERVENTION:    Function: Duane Richardson has functional deficits in the areas of self-care and mobility as a  result of his prior surgeries, chronic pain, and generalized weakness. Specific functional limitations include limited ability to perform transfers and to walk at  baseline, inability to propel a manual wheelchair for distances of greater than approximately 20 feet in the home due to LUE/LLE strength deficits and poor activity tolerance. Duane Richardson is motivated to use a power chair on a daily basis as his primary means of functional mobility in the home, and is cognitively able to operate said power chair. Duane Richardson would like to obtain another Hoveround as he had great success in the past with this equipment. Duane Richardson would greatly benefit from a power chair to improve his overall safety and quality of life in the home, as well as improve his ability to perform ADLs with greater independence.    Addendum: Mr. Eber is looking to replace his Hoveround power chair at this time. Mr. Hensley is unable to utilize a  POV (scooter) in his home due to postural stability limitations affecting sitting posture and transfers as well as having  limited space in the home environment to account for the length of a typical scooter. He would be unable to turn around  doorways to enter rooms necessary for ADLs (bathroom, bedroom, kitchen, etc.).  Mr. Kwong also has significant  pain in BUE with reaching tasks and is unable to maintain the forward flexion and shoulder stability required for  reaching, holding, and operating the tiller of an electric scooter.    If you require any further information concerning Mr. Minjares' positioning, independence or mobility needs; or any further information why  a lesser device will not work, please do not hesitate to contact me at [place], [address] [place], [place] [address] [phone].     Visit Diagnosis: Other symptoms and signs involving the musculoskeletal system - Plan: Ot plan of care cert/re-cert  Pain in left leg - Plan: Ot plan of care cert/re-cert    Problem List Patient Active Problem List   Diagnosis Date Noted  . Atherosclerosis   . Acute colitis 12/05/2015  . Syncope  12/05/2015  . Hypokalemia 12/05/2015  . Hypomagnesemia 12/05/2015  . Thrombocytopenia ([place]) 12/05/2015  . Alcohol withdrawal ([place]) 12/04/2015  . Generalized weakness 12/04/2015  . Abdominal pain 12/04/2015  . Nausea & vomiting 12/04/2015  . Arthritis of left hip 06/07/2013  . Status post THR (total hip replacement) 06/07/2013  . BPH (benign prostatic hypertrophy) with urinary obstruction 03/22/2013  . Protein-calorie malnutrition, severe ([place]) 11/30/2012  . Hip fracture ([place]) 11/23/2012  . HTN (hypertension) 11/23/2012  . Steatosis of liver 11/15/2012  . Anorexia nervosa 10/13/2012  . Loss of weight 07/16/2012  . ETOH abuse   . Weight loss 02/17/2012  . Dysphagia 12/30/2011  . Oral candida 12/30/2011  . Rectal bleed 12/30/2011  . Unintentional weight loss 12/30/2011  . Hx of adenomatous colonic polyps 12/30/2011  . Leucopenia 07/08/2007  . COPD, MILD 12/21/2006  . LIVER FUNCTION TESTS, ABNORMAL 11/09/2006  . HYPERLIPIDEMIA 05/27/2006  . DISORDER, BIPOLAR NOS 05/27/2006  . TOBACCO ABUSE 05/27/2006  . Depression 05/27/2006  . CATARACT NOS 05/27/2006  . ALLERGIC RHINITIS 05/27/2006  . ASTHMA 05/27/2006  . GERD 05/27/2006  . ARTHRITIS 05/27/2006  . LOW BACK PAIN, CHRONIC 05/27/2006  . MALAISE AND FATIGUE 05/27/2006   Guadelupe Sabin, OTR/L  [phone] 10/21/2018, 10:56 AM  [facility] [address] [place], [place], [address] Phone: [phone]   Fax:  [phone]  Name: Exavior Kimmons Mane MRN: [id number] Date of Birth: 01-24-1956

## 2018-12-06 ENCOUNTER — Encounter (HOSPITAL_COMMUNITY): Payer: Self-pay

## 2018-12-06 ENCOUNTER — Ambulatory Visit (HOSPITAL_COMMUNITY)
Admission: RE | Admit: 2018-12-06 | Discharge: 2018-12-06 | Disposition: A | Discharge status: Home / Self Care | Payer: Medicare Other | Source: Ambulatory Visit | Attending: Internal Medicine | Admitting: Internal Medicine

## 2018-12-06 ENCOUNTER — Other Ambulatory Visit: Payer: Self-pay

## 2018-12-06 ENCOUNTER — Other Ambulatory Visit (HOSPITAL_COMMUNITY): Payer: Self-pay | Admitting: Internal Medicine

## 2018-12-06 DIAGNOSIS — J449 Chronic obstructive pulmonary disease, unspecified: Secondary | ICD-10-CM | POA: Diagnosis not present

## 2018-12-20 ENCOUNTER — Other Ambulatory Visit: Payer: Self-pay | Admitting: Family Medicine

## 2019-03-22 ENCOUNTER — Other Ambulatory Visit: Payer: Self-pay

## 2019-03-22 ENCOUNTER — Ambulatory Visit (HOSPITAL_COMMUNITY)
Admission: RE | Admit: 2019-03-22 | Discharge: 2019-03-22 | Disposition: A | Discharge status: Home / Self Care | Payer: Medicare Other | Source: Ambulatory Visit | Attending: Internal Medicine | Admitting: Internal Medicine

## 2019-03-22 ENCOUNTER — Other Ambulatory Visit (HOSPITAL_COMMUNITY): Payer: Self-pay | Admitting: Internal Medicine

## 2019-03-22 DIAGNOSIS — S2243XA Multiple fractures of ribs, bilateral, initial encounter for closed fracture: Secondary | ICD-10-CM | POA: Diagnosis not present

## 2019-03-22 DIAGNOSIS — R0789 Other chest pain: Secondary | ICD-10-CM | POA: Insufficient documentation

## 2019-03-22 DIAGNOSIS — W19XXXA Unspecified fall, initial encounter: Secondary | ICD-10-CM

## 2019-04-12 ENCOUNTER — Encounter (HOSPITAL_COMMUNITY): Payer: Self-pay | Admitting: *Deleted

## 2019-04-12 ENCOUNTER — Inpatient Hospital Stay (HOSPITAL_COMMUNITY)
Admission: EM | Admit: 2019-04-12 | Discharge: 2019-05-05 | DRG: 480 | Disposition: A | Discharge status: Home / Self Care | Payer: Medicare Other | Attending: Internal Medicine | Admitting: Internal Medicine

## 2019-04-12 ENCOUNTER — Other Ambulatory Visit: Payer: Self-pay

## 2019-04-12 ENCOUNTER — Emergency Department (HOSPITAL_COMMUNITY): Payer: Medicare Other

## 2019-04-12 DIAGNOSIS — M25572 Pain in left ankle and joints of left foot: Secondary | ICD-10-CM

## 2019-04-12 DIAGNOSIS — A419 Sepsis, unspecified organism: Secondary | ICD-10-CM | POA: Diagnosis not present

## 2019-04-12 DIAGNOSIS — E871 Hypo-osmolality and hyponatremia: Secondary | ICD-10-CM | POA: Diagnosis not present

## 2019-04-12 DIAGNOSIS — I10 Essential (primary) hypertension: Secondary | ICD-10-CM | POA: Diagnosis present

## 2019-04-12 DIAGNOSIS — E162 Hypoglycemia, unspecified: Secondary | ICD-10-CM | POA: Diagnosis not present

## 2019-04-12 DIAGNOSIS — M1612 Unilateral primary osteoarthritis, left hip: Secondary | ICD-10-CM

## 2019-04-12 DIAGNOSIS — E43 Unspecified severe protein-calorie malnutrition: Secondary | ICD-10-CM | POA: Diagnosis not present

## 2019-04-12 DIAGNOSIS — M25551 Pain in right hip: Secondary | ICD-10-CM | POA: Diagnosis not present

## 2019-04-12 DIAGNOSIS — B9689 Other specified bacterial agents as the cause of diseases classified elsewhere: Secondary | ICD-10-CM | POA: Diagnosis not present

## 2019-04-12 DIAGNOSIS — F1010 Alcohol abuse, uncomplicated: Secondary | ICD-10-CM | POA: Diagnosis present

## 2019-04-12 DIAGNOSIS — D509 Iron deficiency anemia, unspecified: Secondary | ICD-10-CM | POA: Diagnosis present

## 2019-04-12 DIAGNOSIS — E876 Hypokalemia: Secondary | ICD-10-CM | POA: Diagnosis not present

## 2019-04-12 DIAGNOSIS — F10239 Alcohol dependence with withdrawal, unspecified: Secondary | ICD-10-CM | POA: Diagnosis not present

## 2019-04-12 DIAGNOSIS — K219 Gastro-esophageal reflux disease without esophagitis: Secondary | ICD-10-CM | POA: Diagnosis not present

## 2019-04-12 DIAGNOSIS — R509 Fever, unspecified: Secondary | ICD-10-CM

## 2019-04-12 DIAGNOSIS — J449 Chronic obstructive pulmonary disease, unspecified: Secondary | ICD-10-CM

## 2019-04-12 DIAGNOSIS — Z20822 Contact with and (suspected) exposure to covid-19: Secondary | ICD-10-CM | POA: Diagnosis not present

## 2019-04-12 DIAGNOSIS — Z79899 Other long term (current) drug therapy: Secondary | ICD-10-CM

## 2019-04-12 DIAGNOSIS — K567 Ileus, unspecified: Secondary | ICD-10-CM | POA: Diagnosis not present

## 2019-04-12 DIAGNOSIS — Z9079 Acquired absence of other genital organ(s): Secondary | ICD-10-CM

## 2019-04-12 DIAGNOSIS — D519 Vitamin B12 deficiency anemia, unspecified: Secondary | ICD-10-CM | POA: Diagnosis present

## 2019-04-12 DIAGNOSIS — D62 Acute posthemorrhagic anemia: Secondary | ICD-10-CM | POA: Diagnosis not present

## 2019-04-12 DIAGNOSIS — Z86010 Personal history of colonic polyps: Secondary | ICD-10-CM

## 2019-04-12 DIAGNOSIS — J9811 Atelectasis: Secondary | ICD-10-CM | POA: Diagnosis not present

## 2019-04-12 DIAGNOSIS — D473 Essential (hemorrhagic) thrombocythemia: Secondary | ICD-10-CM | POA: Diagnosis not present

## 2019-04-12 DIAGNOSIS — R531 Weakness: Secondary | ICD-10-CM

## 2019-04-12 DIAGNOSIS — E872 Acidosis: Secondary | ICD-10-CM | POA: Diagnosis not present

## 2019-04-12 DIAGNOSIS — S72001A Fracture of unspecified part of neck of right femur, initial encounter for closed fracture: Secondary | ICD-10-CM

## 2019-04-12 DIAGNOSIS — F319 Bipolar disorder, unspecified: Secondary | ICD-10-CM | POA: Diagnosis present

## 2019-04-12 DIAGNOSIS — N390 Urinary tract infection, site not specified: Secondary | ICD-10-CM | POA: Diagnosis not present

## 2019-04-12 DIAGNOSIS — M1990 Unspecified osteoarthritis, unspecified site: Secondary | ICD-10-CM | POA: Diagnosis present

## 2019-04-12 DIAGNOSIS — S72141A Displaced intertrochanteric fracture of right femur, initial encounter for closed fracture: Secondary | ICD-10-CM | POA: Diagnosis not present

## 2019-04-12 DIAGNOSIS — Z7951 Long term (current) use of inhaled steroids: Secondary | ICD-10-CM

## 2019-04-12 DIAGNOSIS — D72829 Elevated white blood cell count, unspecified: Secondary | ICD-10-CM

## 2019-04-12 DIAGNOSIS — Z9889 Other specified postprocedural states: Secondary | ICD-10-CM

## 2019-04-12 DIAGNOSIS — Y908 Blood alcohol level of 240 mg/100 ml or more: Secondary | ICD-10-CM | POA: Diagnosis not present

## 2019-04-12 DIAGNOSIS — F10220 Alcohol dependence with intoxication, uncomplicated: Secondary | ICD-10-CM | POA: Diagnosis present

## 2019-04-12 DIAGNOSIS — S728X1A Other fracture of right femur, initial encounter for closed fracture: Secondary | ICD-10-CM | POA: Diagnosis present

## 2019-04-12 DIAGNOSIS — Z96641 Presence of right artificial hip joint: Secondary | ICD-10-CM

## 2019-04-12 DIAGNOSIS — Z818 Family history of other mental and behavioral disorders: Secondary | ICD-10-CM

## 2019-04-12 DIAGNOSIS — Z6824 Body mass index (BMI) 24.0-24.9, adult: Secondary | ICD-10-CM

## 2019-04-12 DIAGNOSIS — K56609 Unspecified intestinal obstruction, unspecified as to partial versus complete obstruction: Secondary | ICD-10-CM

## 2019-04-12 DIAGNOSIS — Z0189 Encounter for other specified special examinations: Secondary | ICD-10-CM

## 2019-04-12 DIAGNOSIS — K760 Fatty (change of) liver, not elsewhere classified: Secondary | ICD-10-CM | POA: Diagnosis present

## 2019-04-12 DIAGNOSIS — R0602 Shortness of breath: Secondary | ICD-10-CM

## 2019-04-12 DIAGNOSIS — K9189 Other postprocedural complications and disorders of digestive system: Secondary | ICD-10-CM | POA: Diagnosis not present

## 2019-04-12 DIAGNOSIS — Z4659 Encounter for fitting and adjustment of other gastrointestinal appliance and device: Secondary | ICD-10-CM

## 2019-04-12 DIAGNOSIS — Z96642 Presence of left artificial hip joint: Secondary | ICD-10-CM | POA: Diagnosis present

## 2019-04-12 DIAGNOSIS — H269 Unspecified cataract: Secondary | ICD-10-CM | POA: Diagnosis present

## 2019-04-12 DIAGNOSIS — Z7982 Long term (current) use of aspirin: Secondary | ICD-10-CM

## 2019-04-12 DIAGNOSIS — R140 Abdominal distension (gaseous): Secondary | ICD-10-CM

## 2019-04-12 DIAGNOSIS — F17210 Nicotine dependence, cigarettes, uncomplicated: Secondary | ICD-10-CM | POA: Diagnosis present

## 2019-04-12 DIAGNOSIS — Z419 Encounter for procedure for purposes other than remedying health state, unspecified: Secondary | ICD-10-CM

## 2019-04-12 DIAGNOSIS — F10920 Alcohol use, unspecified with intoxication, uncomplicated: Secondary | ICD-10-CM

## 2019-04-12 LAB — URINALYSIS, ROUTINE W REFLEX MICROSCOPIC
Bacteria, UA: NONE SEEN
Bilirubin Urine: NEGATIVE
Glucose, UA: NEGATIVE mg/dL
Ketones, ur: NEGATIVE mg/dL
Nitrite: NEGATIVE
Protein, ur: 30 mg/dL — AB
Specific Gravity, Urine: 1.015 (ref 1.005–1.030)
pH: 5 (ref 5.0–8.0)

## 2019-04-12 LAB — BASIC METABOLIC PANEL
Anion gap: 9 (ref 5–15)
BUN: 8 mg/dL (ref 8–23)
CO2: 22 mmol/L (ref 22–32)
Calcium: 8.1 mg/dL — ABNORMAL LOW (ref 8.9–10.3)
Chloride: 107 mmol/L (ref 98–111)
Creatinine, Ser: 0.6 mg/dL — ABNORMAL LOW (ref 0.61–1.24)
GFR calc Af Amer: >60 mL/min (ref >60)
GFR calc non Af Amer: >60 mL/min (ref >60)
Glucose, Bld: 114 mg/dL — ABNORMAL HIGH (ref 70–99)
Potassium: 3.9 mmol/L (ref 3.5–5.1)
Sodium: 138 mmol/L (ref 135–145)

## 2019-04-12 LAB — CBC WITH DIFFERENTIAL/PLATELET
Abs Immature Granulocytes: 0.05 10*3/uL (ref 0.00–0.07)
Basophils Absolute: 0.1 10*3/uL (ref 0.0–0.1)
Basophils Relative: 1 %
Eosinophils Absolute: 0 10*3/uL (ref 0.0–0.5)
Eosinophils Relative: 1 %
HCT: 40.9 % (ref 39.0–52.0)
Hemoglobin: 13.6 g/dL (ref 13.0–17.0)
Immature Granulocytes: 1 %
Lymphocytes Relative: 15 %
Lymphs Abs: 1.2 10*3/uL (ref 0.7–4.0)
MCH: 31.7 pg (ref 26.0–34.0)
MCHC: 33.3 g/dL (ref 30.0–36.0)
MCV: 95.3 fL (ref 80.0–100.0)
Monocytes Absolute: 0.8 10*3/uL (ref 0.1–1.0)
Monocytes Relative: 10 %
Neutro Abs: 5.9 10*3/uL (ref 1.7–7.7)
Neutrophils Relative %: 72 %
Platelets: 188 10*3/uL (ref 150–400)
RBC: 4.29 MIL/uL (ref 4.22–5.81)
RDW: 13.3 % (ref 11.5–15.5)
WBC: 8.1 10*3/uL (ref 4.0–10.5)
nRBC: 0 % (ref 0.0–0.2)

## 2019-04-12 LAB — ETHANOL: Alcohol, Ethyl (B): 249 mg/dL — ABNORMAL HIGH (ref <10)

## 2019-04-12 MED ORDER — MORPHINE SULFATE (PF) 4 MG/ML IV SOLN
4.0000 mg | Freq: Once | INTRAVENOUS | Status: AC
  Administered 2019-04-12: 4 mg via INTRAVENOUS
  Filled 2019-04-12: qty 1

## 2019-04-12 MED ORDER — ONDANSETRON HCL 4 MG/2ML IJ SOLN
4.0000 mg | Freq: Once | INTRAMUSCULAR | Status: AC
  Administered 2019-04-12: 4 mg via INTRAVENOUS
  Filled 2019-04-12: qty 2

## 2019-04-12 MED ORDER — FENTANYL CITRATE (PF) 100 MCG/2ML IJ SOLN
50.0000 ug | Freq: Once | INTRAMUSCULAR | Status: AC
  Administered 2019-04-12: 50 ug via INTRAVENOUS
  Filled 2019-04-12: qty 2

## 2019-04-12 NOTE — ED Notes (Signed)
Vital signs stable. Patient is resting comfortably.
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
## 2019-04-13 ENCOUNTER — Inpatient Hospital Stay (HOSPITAL_COMMUNITY): Payer: Medicare Other

## 2019-04-13 ENCOUNTER — Encounter (HOSPITAL_COMMUNITY): Payer: Self-pay | Admitting: Internal Medicine

## 2019-04-13 DIAGNOSIS — A419 Sepsis, unspecified organism: Secondary | ICD-10-CM | POA: Diagnosis not present

## 2019-04-13 DIAGNOSIS — M1990 Unspecified osteoarthritis, unspecified site: Secondary | ICD-10-CM | POA: Diagnosis present

## 2019-04-13 DIAGNOSIS — M25551 Pain in right hip: Secondary | ICD-10-CM

## 2019-04-13 DIAGNOSIS — J449 Chronic obstructive pulmonary disease, unspecified: Secondary | ICD-10-CM

## 2019-04-13 DIAGNOSIS — D519 Vitamin B12 deficiency anemia, unspecified: Secondary | ICD-10-CM | POA: Diagnosis present

## 2019-04-13 DIAGNOSIS — D72829 Elevated white blood cell count, unspecified: Secondary | ICD-10-CM | POA: Diagnosis not present

## 2019-04-13 DIAGNOSIS — K219 Gastro-esophageal reflux disease without esophagitis: Secondary | ICD-10-CM

## 2019-04-13 DIAGNOSIS — F1010 Alcohol abuse, uncomplicated: Secondary | ICD-10-CM

## 2019-04-13 DIAGNOSIS — B9689 Other specified bacterial agents as the cause of diseases classified elsewhere: Secondary | ICD-10-CM | POA: Diagnosis not present

## 2019-04-13 DIAGNOSIS — E876 Hypokalemia: Secondary | ICD-10-CM | POA: Diagnosis not present

## 2019-04-13 DIAGNOSIS — K5900 Constipation, unspecified: Secondary | ICD-10-CM | POA: Diagnosis not present

## 2019-04-13 DIAGNOSIS — Z20822 Contact with and (suspected) exposure to covid-19: Secondary | ICD-10-CM | POA: Diagnosis present

## 2019-04-13 DIAGNOSIS — I10 Essential (primary) hypertension: Secondary | ICD-10-CM | POA: Diagnosis present

## 2019-04-13 DIAGNOSIS — E162 Hypoglycemia, unspecified: Secondary | ICD-10-CM | POA: Diagnosis not present

## 2019-04-13 DIAGNOSIS — E872 Acidosis: Secondary | ICD-10-CM | POA: Diagnosis not present

## 2019-04-13 DIAGNOSIS — N390 Urinary tract infection, site not specified: Secondary | ICD-10-CM | POA: Diagnosis not present

## 2019-04-13 DIAGNOSIS — K9189 Other postprocedural complications and disorders of digestive system: Secondary | ICD-10-CM | POA: Diagnosis not present

## 2019-04-13 DIAGNOSIS — S72141A Displaced intertrochanteric fracture of right femur, initial encounter for closed fracture: Secondary | ICD-10-CM | POA: Diagnosis present

## 2019-04-13 DIAGNOSIS — S728X1A Other fracture of right femur, initial encounter for closed fracture: Secondary | ICD-10-CM | POA: Diagnosis not present

## 2019-04-13 DIAGNOSIS — F10239 Alcohol dependence with withdrawal, unspecified: Secondary | ICD-10-CM | POA: Diagnosis not present

## 2019-04-13 DIAGNOSIS — E871 Hypo-osmolality and hyponatremia: Secondary | ICD-10-CM | POA: Diagnosis not present

## 2019-04-13 DIAGNOSIS — D62 Acute posthemorrhagic anemia: Secondary | ICD-10-CM | POA: Diagnosis not present

## 2019-04-13 DIAGNOSIS — D509 Iron deficiency anemia, unspecified: Secondary | ICD-10-CM | POA: Diagnosis present

## 2019-04-13 DIAGNOSIS — E43 Unspecified severe protein-calorie malnutrition: Secondary | ICD-10-CM | POA: Diagnosis not present

## 2019-04-13 DIAGNOSIS — K567 Ileus, unspecified: Secondary | ICD-10-CM | POA: Diagnosis not present

## 2019-04-13 DIAGNOSIS — K760 Fatty (change of) liver, not elsewhere classified: Secondary | ICD-10-CM | POA: Diagnosis present

## 2019-04-13 DIAGNOSIS — Y908 Blood alcohol level of 240 mg/100 ml or more: Secondary | ICD-10-CM | POA: Diagnosis present

## 2019-04-13 DIAGNOSIS — D473 Essential (hemorrhagic) thrombocythemia: Secondary | ICD-10-CM | POA: Diagnosis not present

## 2019-04-13 DIAGNOSIS — J9811 Atelectasis: Secondary | ICD-10-CM | POA: Diagnosis not present

## 2019-04-13 HISTORY — DX: Pain in right hip: M25.551

## 2019-04-13 LAB — COMPREHENSIVE METABOLIC PANEL
ALT: 16 U/L (ref 0–44)
AST: 35 U/L (ref 15–41)
Albumin: 3.4 g/dL — ABNORMAL LOW (ref 3.5–5.0)
Alkaline Phosphatase: 102 U/L (ref 38–126)
Anion gap: 10 (ref 5–15)
BUN: 8 mg/dL (ref 8–23)
CO2: 20 mmol/L — ABNORMAL LOW (ref 22–32)
Calcium: 7.9 mg/dL — ABNORMAL LOW (ref 8.9–10.3)
Chloride: 108 mmol/L (ref 98–111)
Creatinine, Ser: 0.63 mg/dL (ref 0.61–1.24)
GFR calc Af Amer: >60 mL/min (ref >60)
GFR calc non Af Amer: >60 mL/min (ref >60)
Glucose, Bld: 128 mg/dL — ABNORMAL HIGH (ref 70–99)
Potassium: 4.4 mmol/L (ref 3.5–5.1)
Sodium: 138 mmol/L (ref 135–145)
Total Bilirubin: 0.6 mg/dL (ref 0.3–1.2)
Total Protein: 6.7 g/dL (ref 6.5–8.1)

## 2019-04-13 LAB — CBC
HCT: 38.5 % — ABNORMAL LOW (ref 39.0–52.0)
Hemoglobin: 12.9 g/dL — ABNORMAL LOW (ref 13.0–17.0)
MCH: 32.2 pg (ref 26.0–34.0)
MCHC: 33.5 g/dL (ref 30.0–36.0)
MCV: 96 fL (ref 80.0–100.0)
Platelets: 170 10*3/uL (ref 150–400)
RBC: 4.01 MIL/uL — ABNORMAL LOW (ref 4.22–5.81)
RDW: 13.5 % (ref 11.5–15.5)
WBC: 6.5 10*3/uL (ref 4.0–10.5)
nRBC: 0 % (ref 0.0–0.2)

## 2019-04-13 LAB — SARS CORONAVIRUS 2 (TAT 6-24 HRS): SARS Coronavirus 2: NEGATIVE

## 2019-04-13 LAB — TYPE AND SCREEN
ABO/RH(D): A POS
Antibody Screen: NEGATIVE

## 2019-04-13 LAB — PHOSPHORUS: Phosphorus: 3.8 mg/dL (ref 2.5–4.6)

## 2019-04-13 LAB — MAGNESIUM: Magnesium: 1.8 mg/dL (ref 1.7–2.4)

## 2019-04-13 LAB — SURGICAL PCR SCREEN
MRSA, PCR: NEGATIVE
Staphylococcus aureus: NEGATIVE

## 2019-04-13 LAB — HIV ANTIBODY (ROUTINE TESTING W REFLEX): HIV Screen 4th Generation wRfx: NONREACTIVE

## 2019-04-13 MED ORDER — LORAZEPAM 2 MG/ML IJ SOLN
1.0000 mg | INTRAMUSCULAR | Status: AC | PRN
  Administered 2019-04-13: 2 mg via INTRAVENOUS
  Administered 2019-04-15: 14:00:00 1 mg via INTRAVENOUS
  Administered 2019-04-15: 2 mg via INTRAVENOUS
  Filled 2019-04-13 (×3): qty 1

## 2019-04-13 MED ORDER — FOLIC ACID 1 MG PO TABS
1.0000 mg | ORAL_TABLET | Freq: Every day | ORAL | Status: DC
  Administered 2019-04-13 – 2019-05-02 (×11): 1 mg via ORAL
  Filled 2019-04-13 (×17): qty 1

## 2019-04-13 MED ORDER — THIAMINE HCL 100 MG/ML IJ SOLN
100.0000 mg | Freq: Every day | INTRAMUSCULAR | Status: DC
  Administered 2019-04-25: 100 mg via INTRAVENOUS
  Filled 2019-04-13 (×3): qty 2

## 2019-04-13 MED ORDER — LORAZEPAM 1 MG PO TABS
1.0000 mg | ORAL_TABLET | ORAL | Status: AC | PRN
  Administered 2019-04-13: 4 mg via ORAL
  Filled 2019-04-13: qty 4

## 2019-04-13 MED ORDER — MOMETASONE FURO-FORMOTEROL FUM 200-5 MCG/ACT IN AERO
2.0000 | INHALATION_SPRAY | Freq: Two times a day (BID) | RESPIRATORY_TRACT | Status: DC
  Administered 2019-04-14 – 2019-05-05 (×35): 2 via RESPIRATORY_TRACT
  Filled 2019-04-13 (×2): qty 8.8

## 2019-04-13 MED ORDER — SENNOSIDES-DOCUSATE SODIUM 8.6-50 MG PO TABS
1.0000 | ORAL_TABLET | Freq: Two times a day (BID) | ORAL | Status: DC
  Administered 2019-04-13 – 2019-05-04 (×24): 1 via ORAL
  Filled 2019-04-13 (×35): qty 1

## 2019-04-13 MED ORDER — ACETAMINOPHEN 650 MG RE SUPP: 650.0000 mg | Freq: Four times a day (QID) | RECTAL | Status: DC | PRN

## 2019-04-13 MED ORDER — ALBUTEROL SULFATE (2.5 MG/3ML) 0.083% IN NEBU
2.5000 mg | INHALATION_SOLUTION | RESPIRATORY_TRACT | Status: DC | PRN
  Administered 2019-04-24: 2.5 mg via RESPIRATORY_TRACT
  Filled 2019-04-13: qty 3

## 2019-04-13 MED ORDER — THIAMINE HCL 100 MG PO TABS
100.0000 mg | ORAL_TABLET | Freq: Every day | ORAL | Status: DC
  Administered 2019-04-13 – 2019-05-02 (×12): 100 mg via ORAL
  Filled 2019-04-13 (×17): qty 1

## 2019-04-13 MED ORDER — ADULT MULTIVITAMIN W/MINERALS CH
1.0000 | ORAL_TABLET | Freq: Every day | ORAL | Status: DC
  Administered 2019-04-13 – 2019-05-02 (×12): 1 via ORAL
  Filled 2019-04-13 (×16): qty 1

## 2019-04-13 MED ORDER — PANTOPRAZOLE SODIUM 40 MG PO TBEC
40.0000 mg | DELAYED_RELEASE_TABLET | Freq: Every day | ORAL | Status: DC
  Administered 2019-04-13 – 2019-04-23 (×9): 40 mg via ORAL
  Filled 2019-04-13 (×9): qty 1

## 2019-04-13 MED ORDER — MORPHINE SULFATE (PF) 2 MG/ML IV SOLN
2.0000 mg | INTRAVENOUS | Status: DC | PRN
  Administered 2019-04-13 – 2019-04-25 (×8): 2 mg via INTRAVENOUS
  Filled 2019-04-13 (×9): qty 1

## 2019-04-13 MED ORDER — AMLODIPINE BESYLATE 10 MG PO TABS
10.0000 mg | ORAL_TABLET | Freq: Every day | ORAL | Status: DC
  Administered 2019-04-15 – 2019-04-17 (×2): 10 mg via ORAL
  Filled 2019-04-13 (×3): qty 1

## 2019-04-13 MED ORDER — ENSURE PRE-SURGERY PO LIQD
296.0000 mL | Freq: Once | ORAL | Status: DC
  Filled 2019-04-13: qty 296

## 2019-04-13 MED ORDER — POLYETHYLENE GLYCOL 3350 17 G PO PACK
17.0000 g | PACK | Freq: Every day | ORAL | Status: DC
  Administered 2019-04-13 – 2019-04-17 (×2): 17 g via ORAL
  Filled 2019-04-13 (×3): qty 1

## 2019-04-13 MED ORDER — CHLORHEXIDINE GLUCONATE 4 % EX LIQD
60.0000 mL | Freq: Once | CUTANEOUS | Status: AC
  Administered 2019-04-14: 4 via TOPICAL
  Filled 2019-04-13: qty 60

## 2019-04-13 MED ORDER — OXYCODONE HCL 5 MG PO TABS
5.0000 mg | ORAL_TABLET | ORAL | Status: DC | PRN
  Administered 2019-04-13 – 2019-04-20 (×5): 5 mg via ORAL
  Filled 2019-04-13 (×5): qty 1

## 2019-04-13 MED ORDER — ENSURE PRE-SURGERY PO LIQD
296.0000 mL | Freq: Once | ORAL | Status: AC
  Administered 2019-04-13: 296 mL via ORAL
  Filled 2019-04-13: qty 296

## 2019-04-13 MED ORDER — ACETAMINOPHEN 325 MG PO TABS
650.0000 mg | ORAL_TABLET | Freq: Four times a day (QID) | ORAL | Status: DC | PRN
  Filled 2019-04-13: qty 2

## 2019-04-13 MED ORDER — POVIDONE-IODINE 10 % EX SWAB: 2.0000 "application " | Freq: Once | CUTANEOUS | Status: DC

## 2019-04-13 NOTE — Progress Notes (Addendum)
Patient has arrived to floor and is all settled in. A&Ox4, but drowsy & has slurred speech at moments. Occasionally yells out "Ouch." Currently resting in bed. Will continue to monitor.   0529: Order to call orthopedic to inform them that patient is on the floor. MD on call notified.

## 2019-04-13 NOTE — Progress Notes (Signed)
OT Cancellation Note  Patient Details Name: Duane Richardson [place] MRN: [id number] DOB: October 07, 1955   Cancelled Treatment:    Reason Eval/Treat Not Completed: Patient not medically ready;Medical issues which prohibited therapy. Pt awaiting surgery, OT will evaluate when appropriate  [name] 04/13/2019, 10:05 AM

## 2019-04-13 NOTE — Progress Notes (Signed)
PT Cancellation Note  Patient Details Name: Duane Richardson MRN: [id number] DOB: 1956-01-24   Cancelled Treatment:    Reason Eval/Treat Not Completed: Other (comment)   Transferred to [facility] with R femur fracture for definitive Orthopedic management;   Will await surgery and will proceed with PT evaluation post-op;   Will follow,  Roney Marion, PT  Acute Rehabilitation Services Pager [phone] Office [place] 04/13/2019, 8:00 AM

## 2019-04-13 NOTE — ED Notes (Signed)
[facility] called for transport at this time. 
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
## 2019-04-13 NOTE — Anesthesia Preprocedure Evaluation (Addendum)
Anesthesia Evaluation  Patient identified by MRN, date of birth, ID band Patient awake    Reviewed: Allergy & Precautions, H&P , NPO status , Patient's Chart, lab work & pertinent test results  Airway Mallampati: III  TM Distance: >3 FB Neck ROM: Full   Comment: Not fully cooperating with airway exam Dental  (+) Edentulous Lower, Edentulous Upper   Pulmonary asthma , COPD,  COPD inhaler, Current Smoker,    Pulmonary exam normal        Cardiovascular hypertension, Pt. on medications Normal cardiovascular exam Rhythm:Regular Rate:Normal     Neuro/Psych  Headaches, PSYCHIATRIC DISORDERS Anxiety Depression Bipolar Disorder    GI/Hepatic GERD  Medicated,(+)     substance abuse  alcohol use, Alcoholism, on withdrawal protocol   Endo/Other  negative endocrine ROS  Renal/GU negative Renal ROS  negative genitourinary   Musculoskeletal  (+) Arthritis , Osteoarthritis,  Right hip fx   Abdominal Normal abdominal exam  (+)   Peds  Hematology  (+) Blood dyscrasia, anemia , hct 38, plt 170   Anesthesia Other Findings Extremely sleepy when trying to question the patient, cooperates very minimally with physical exam. Appears much older than stated age, pinpoint pupils. When awakened, exhibits a tremor of RUE.  Reproductive/Obstetrics negative OB ROS                           Anesthesia Physical  Anesthesia Plan  ASA: IV  Anesthesia Plan: General   Post-op Pain Management:    Induction: Intravenous  PONV Risk Score and Plan: 2 and Ondansetron, Dexamethasone, Treatment may vary due to age or medical condition and Midazolam  Airway Management Planned: Oral ETT  Additional Equipment: None  Intra-op Plan:   Post-operative Plan: Extubation in OR  Informed Consent: I have reviewed the patients History and Physical, chart, labs and discussed the procedure including the risks, benefits and  alternatives for the proposed anesthesia with the patient or authorized representative who has indicated his/her understanding and acceptance.     Dental advisory given  Plan Discussed with: CRNA  Anesthesia Plan Comments: (Unlikely to cooperate with spinal, will do under GA/ETT. )     Anesthesia Quick Evaluation
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
## 2019-04-14 ENCOUNTER — Inpatient Hospital Stay (HOSPITAL_COMMUNITY): Payer: Medicare Other | Admitting: Anesthesiology

## 2019-04-14 ENCOUNTER — Encounter (HOSPITAL_COMMUNITY)
Admission: EM | Disposition: A | Discharge status: Home / Self Care | Payer: Self-pay | Source: Home / Self Care | Attending: Internal Medicine

## 2019-04-14 ENCOUNTER — Encounter (HOSPITAL_COMMUNITY): Payer: Self-pay | Admitting: Internal Medicine

## 2019-04-14 ENCOUNTER — Inpatient Hospital Stay (HOSPITAL_COMMUNITY): Payer: Medicare Other

## 2019-04-14 DIAGNOSIS — S728X1A Other fracture of right femur, initial encounter for closed fracture: Secondary | ICD-10-CM

## 2019-04-14 DIAGNOSIS — K760 Fatty (change of) liver, not elsewhere classified: Secondary | ICD-10-CM | POA: Diagnosis not present

## 2019-04-14 DIAGNOSIS — A419 Sepsis, unspecified organism: Secondary | ICD-10-CM | POA: Diagnosis not present

## 2019-04-14 DIAGNOSIS — D473 Essential (hemorrhagic) thrombocythemia: Secondary | ICD-10-CM | POA: Diagnosis not present

## 2019-04-14 DIAGNOSIS — N390 Urinary tract infection, site not specified: Secondary | ICD-10-CM | POA: Diagnosis not present

## 2019-04-14 DIAGNOSIS — K9189 Other postprocedural complications and disorders of digestive system: Secondary | ICD-10-CM | POA: Diagnosis not present

## 2019-04-14 DIAGNOSIS — S72141A Displaced intertrochanteric fracture of right femur, initial encounter for closed fracture: Secondary | ICD-10-CM | POA: Diagnosis not present

## 2019-04-14 DIAGNOSIS — J449 Chronic obstructive pulmonary disease, unspecified: Secondary | ICD-10-CM | POA: Diagnosis not present

## 2019-04-14 DIAGNOSIS — J9811 Atelectasis: Secondary | ICD-10-CM | POA: Diagnosis not present

## 2019-04-14 DIAGNOSIS — D509 Iron deficiency anemia, unspecified: Secondary | ICD-10-CM | POA: Diagnosis not present

## 2019-04-14 DIAGNOSIS — K567 Ileus, unspecified: Secondary | ICD-10-CM | POA: Diagnosis not present

## 2019-04-14 DIAGNOSIS — K219 Gastro-esophageal reflux disease without esophagitis: Secondary | ICD-10-CM | POA: Diagnosis not present

## 2019-04-14 DIAGNOSIS — E871 Hypo-osmolality and hyponatremia: Secondary | ICD-10-CM | POA: Diagnosis not present

## 2019-04-14 DIAGNOSIS — F10239 Alcohol dependence with withdrawal, unspecified: Secondary | ICD-10-CM | POA: Diagnosis not present

## 2019-04-14 DIAGNOSIS — M25551 Pain in right hip: Secondary | ICD-10-CM | POA: Diagnosis present

## 2019-04-14 DIAGNOSIS — Z20822 Contact with and (suspected) exposure to covid-19: Secondary | ICD-10-CM | POA: Diagnosis not present

## 2019-04-14 DIAGNOSIS — B9689 Other specified bacterial agents as the cause of diseases classified elsewhere: Secondary | ICD-10-CM | POA: Diagnosis not present

## 2019-04-14 DIAGNOSIS — D519 Vitamin B12 deficiency anemia, unspecified: Secondary | ICD-10-CM | POA: Diagnosis not present

## 2019-04-14 DIAGNOSIS — I10 Essential (primary) hypertension: Secondary | ICD-10-CM | POA: Diagnosis not present

## 2019-04-14 DIAGNOSIS — E162 Hypoglycemia, unspecified: Secondary | ICD-10-CM | POA: Diagnosis not present

## 2019-04-14 DIAGNOSIS — E872 Acidosis: Secondary | ICD-10-CM | POA: Diagnosis not present

## 2019-04-14 DIAGNOSIS — F1010 Alcohol abuse, uncomplicated: Secondary | ICD-10-CM | POA: Diagnosis not present

## 2019-04-14 DIAGNOSIS — Y908 Blood alcohol level of 240 mg/100 ml or more: Secondary | ICD-10-CM | POA: Diagnosis not present

## 2019-04-14 DIAGNOSIS — E43 Unspecified severe protein-calorie malnutrition: Secondary | ICD-10-CM | POA: Diagnosis not present

## 2019-04-14 DIAGNOSIS — M1990 Unspecified osteoarthritis, unspecified site: Secondary | ICD-10-CM | POA: Diagnosis not present

## 2019-04-14 DIAGNOSIS — E876 Hypokalemia: Secondary | ICD-10-CM | POA: Diagnosis not present

## 2019-04-14 DIAGNOSIS — D62 Acute posthemorrhagic anemia: Secondary | ICD-10-CM | POA: Diagnosis not present

## 2019-04-14 HISTORY — PX: INTRAMEDULLARY (IM) NAIL INTERTROCHANTERIC: SHX5875

## 2019-04-14 LAB — CBC WITH DIFFERENTIAL/PLATELET
Abs Immature Granulocytes: 0 10*3/uL (ref 0.00–0.07)
Basophils Absolute: 0.1 10*3/uL (ref 0.0–0.1)
Basophils Relative: 1 %
Eosinophils Absolute: 0 10*3/uL (ref 0.0–0.5)
Eosinophils Relative: 0 %
HCT: 34.5 % — ABNORMAL LOW (ref 39.0–52.0)
Hemoglobin: 11.4 g/dL — ABNORMAL LOW (ref 13.0–17.0)
Lymphocytes Relative: 16 %
Lymphs Abs: 1.8 10*3/uL (ref 0.7–4.0)
MCH: 31.8 pg (ref 26.0–34.0)
MCHC: 33 g/dL (ref 30.0–36.0)
MCV: 96.4 fL (ref 80.0–100.0)
Monocytes Absolute: 1.4 10*3/uL — ABNORMAL HIGH (ref 0.1–1.0)
Monocytes Relative: 12 %
Neutro Abs: 8 10*3/uL — ABNORMAL HIGH (ref 1.7–7.7)
Neutrophils Relative %: 71 %
Platelets: 144 10*3/uL — ABNORMAL LOW (ref 150–400)
RBC: 3.58 MIL/uL — ABNORMAL LOW (ref 4.22–5.81)
RDW: 13.6 % (ref 11.5–15.5)
WBC: 11.3 10*3/uL — ABNORMAL HIGH (ref 4.0–10.5)
nRBC: 0 % (ref 0.0–0.2)
nRBC: 0 /100 WBC

## 2019-04-14 LAB — BASIC METABOLIC PANEL
Anion gap: 8 (ref 5–15)
BUN: 8 mg/dL (ref 8–23)
CO2: 24 mmol/L (ref 22–32)
Calcium: 8.6 mg/dL — ABNORMAL LOW (ref 8.9–10.3)
Chloride: 106 mmol/L (ref 98–111)
Creatinine, Ser: 0.68 mg/dL (ref 0.61–1.24)
GFR calc Af Amer: >60 mL/min (ref >60)
GFR calc non Af Amer: >60 mL/min (ref >60)
Glucose, Bld: 133 mg/dL — ABNORMAL HIGH (ref 70–99)
Potassium: 4.2 mmol/L (ref 3.5–5.1)
Sodium: 138 mmol/L (ref 135–145)

## 2019-04-14 LAB — CREATININE, SERUM
Creatinine, Ser: 0.76 mg/dL (ref 0.61–1.24)
GFR calc Af Amer: >60 mL/min (ref >60)
GFR calc non Af Amer: >60 mL/min (ref >60)

## 2019-04-14 LAB — CBC
HCT: 33.2 % — ABNORMAL LOW (ref 39.0–52.0)
Hemoglobin: 11.1 g/dL — ABNORMAL LOW (ref 13.0–17.0)
MCH: 32.1 pg (ref 26.0–34.0)
MCHC: 33.4 g/dL (ref 30.0–36.0)
MCV: 96 fL (ref 80.0–100.0)
Platelets: 137 10*3/uL — ABNORMAL LOW (ref 150–400)
RBC: 3.46 MIL/uL — ABNORMAL LOW (ref 4.22–5.81)
RDW: 13.2 % (ref 11.5–15.5)
WBC: 11.4 10*3/uL — ABNORMAL HIGH (ref 4.0–10.5)
nRBC: 0 % (ref 0.0–0.2)

## 2019-04-14 LAB — TYPE AND SCREEN
ABO/RH(D): A POS
Antibody Screen: NEGATIVE

## 2019-04-14 SURGERY — FIXATION, FRACTURE, INTERTROCHANTERIC, WITH INTRAMEDULLARY ROD
Anesthesia: General | Site: Hip | Laterality: Right

## 2019-04-14 MED ORDER — PHENYLEPHRINE 40 MCG/ML (10ML) SYRINGE FOR IV PUSH (FOR BLOOD PRESSURE SUPPORT)
PREFILLED_SYRINGE | INTRAVENOUS | Status: DC | PRN
  Administered 2019-04-14: 80 ug via INTRAVENOUS

## 2019-04-14 MED ORDER — MENTHOL 3 MG MT LOZG
1.0000 | LOZENGE | OROMUCOSAL | Status: DC | PRN
  Filled 2019-04-14: qty 9

## 2019-04-14 MED ORDER — ROCURONIUM BROMIDE 100 MG/10ML IV SOLN
INTRAVENOUS | Status: DC | PRN
  Administered 2019-04-14: 10 mg via INTRAVENOUS
  Administered 2019-04-14: 50 mg via INTRAVENOUS

## 2019-04-14 MED ORDER — TRANEXAMIC ACID 1000 MG/10ML IV SOLN
2000.0000 mg | INTRAVENOUS | Status: DC
  Filled 2019-04-14: qty 20

## 2019-04-14 MED ORDER — SODIUM CHLORIDE 0.9 % IV SOLN: INTRAVENOUS | Status: DC

## 2019-04-14 MED ORDER — OXYCODONE-ACETAMINOPHEN 5-325 MG PO TABS: 1.0000 | ORAL_TABLET | Freq: Three times a day (TID) | ORAL | 0 refills | Status: DC | PRN

## 2019-04-14 MED ORDER — METHOCARBAMOL 1000 MG/10ML IJ SOLN
500.0000 mg | Freq: Four times a day (QID) | INTRAVENOUS | Status: DC | PRN
  Administered 2019-04-24 – 2019-04-25 (×2): 500 mg via INTRAVENOUS
  Filled 2019-04-14 (×3): qty 5

## 2019-04-14 MED ORDER — KETOROLAC TROMETHAMINE 30 MG/ML IJ SOLN: 30.0000 mg | Freq: Once | INTRAMUSCULAR | Status: DC | PRN

## 2019-04-14 MED ORDER — FENTANYL CITRATE (PF) 250 MCG/5ML IJ SOLN
INTRAMUSCULAR | Status: DC | PRN
  Administered 2019-04-14 (×2): 50 ug via INTRAVENOUS
  Administered 2019-04-14: 150 ug via INTRAVENOUS

## 2019-04-14 MED ORDER — SUGAMMADEX SODIUM 200 MG/2ML IV SOLN
INTRAVENOUS | Status: DC | PRN
  Administered 2019-04-14: 200 mg via INTRAVENOUS

## 2019-04-14 MED ORDER — MIDAZOLAM HCL 2 MG/2ML IJ SOLN
INTRAMUSCULAR | Status: AC
  Filled 2019-04-14: qty 2

## 2019-04-14 MED ORDER — CEFAZOLIN SODIUM-DEXTROSE 2-4 GM/100ML-% IV SOLN: 2.0000 g | INTRAVENOUS | Status: DC

## 2019-04-14 MED ORDER — HYDROCODONE-ACETAMINOPHEN 5-325 MG PO TABS
1.0000 | ORAL_TABLET | ORAL | Status: DC | PRN
  Administered 2019-04-15: 1 via ORAL
  Administered 2019-04-19 – 2019-04-23 (×8): 2 via ORAL
  Filled 2019-04-14 (×5): qty 2
  Filled 2019-04-14: qty 1
  Filled 2019-04-14 (×4): qty 2

## 2019-04-14 MED ORDER — ONDANSETRON HCL 4 MG PO TABS: 4.0000 mg | ORAL_TABLET | Freq: Four times a day (QID) | ORAL | Status: DC | PRN

## 2019-04-14 MED ORDER — 0.9 % SODIUM CHLORIDE (POUR BTL) OPTIME
TOPICAL | Status: DC | PRN
  Administered 2019-04-14: 08:00:00 1000 mL

## 2019-04-14 MED ORDER — METHOCARBAMOL 500 MG PO TABS
500.0000 mg | ORAL_TABLET | Freq: Four times a day (QID) | ORAL | Status: DC | PRN
  Administered 2019-04-18 – 2019-05-03 (×4): 500 mg via ORAL
  Filled 2019-04-14 (×6): qty 1

## 2019-04-14 MED ORDER — DEXAMETHASONE SODIUM PHOSPHATE 10 MG/ML IJ SOLN
INTRAMUSCULAR | Status: DC | PRN
  Administered 2019-04-14: 8 mg via INTRAVENOUS

## 2019-04-14 MED ORDER — DOCUSATE SODIUM 100 MG PO CAPS
100.0000 mg | ORAL_CAPSULE | Freq: Two times a day (BID) | ORAL | Status: DC
  Administered 2019-04-14 – 2019-05-04 (×22): 100 mg via ORAL
  Filled 2019-04-14 (×34): qty 1

## 2019-04-14 MED ORDER — CEFAZOLIN SODIUM-DEXTROSE 2-3 GM-%(50ML) IV SOLR
INTRAVENOUS | Status: DC | PRN
  Administered 2019-04-14: 2 g via INTRAVENOUS

## 2019-04-14 MED ORDER — TRANEXAMIC ACID-NACL 1000-0.7 MG/100ML-% IV SOLN
INTRAVENOUS | Status: AC
  Filled 2019-04-14: qty 100

## 2019-04-14 MED ORDER — ONDANSETRON HCL 4 MG/2ML IJ SOLN
INTRAMUSCULAR | Status: DC | PRN
  Administered 2019-04-14: 4 mg via INTRAVENOUS

## 2019-04-14 MED ORDER — ACETAMINOPHEN 500 MG PO TABS: 1000.0000 mg | ORAL_TABLET | Freq: Once | ORAL | Status: DC

## 2019-04-14 MED ORDER — PHENOL 1.4 % MT LIQD: 1.0000 | OROMUCOSAL | Status: DC | PRN

## 2019-04-14 MED ORDER — ESMOLOL HCL 100 MG/10ML IV SOLN
INTRAVENOUS | Status: DC | PRN
  Administered 2019-04-14: 10 mg via INTRAVENOUS

## 2019-04-14 MED ORDER — PROPOFOL 10 MG/ML IV BOLUS
INTRAVENOUS | Status: DC | PRN
  Administered 2019-04-14: 100 mg via INTRAVENOUS

## 2019-04-14 MED ORDER — SORBITOL 70 % SOLN
30.0000 mL | Freq: Every day | Status: DC | PRN
  Filled 2019-04-14: qty 30

## 2019-04-14 MED ORDER — ONDANSETRON HCL 4 MG/2ML IJ SOLN
4.0000 mg | Freq: Four times a day (QID) | INTRAMUSCULAR | Status: DC | PRN
  Administered 2019-04-24: 4 mg via INTRAVENOUS
  Filled 2019-04-14: qty 2

## 2019-04-14 MED ORDER — CEFAZOLIN SODIUM-DEXTROSE 2-4 GM/100ML-% IV SOLN
2.0000 g | Freq: Four times a day (QID) | INTRAVENOUS | Status: AC
  Administered 2019-04-14 – 2019-04-15 (×3): 2 g via INTRAVENOUS
  Filled 2019-04-14 (×3): qty 100

## 2019-04-14 MED ORDER — PHENYLEPHRINE HCL (PRESSORS) 10 MG/ML IV SOLN
INTRAVENOUS | Status: DC | PRN
  Administered 2019-04-14: 40 ug via INTRAVENOUS

## 2019-04-14 MED ORDER — POLYETHYLENE GLYCOL 3350 17 G PO PACK: 17.0000 g | PACK | Freq: Every day | ORAL | Status: DC | PRN

## 2019-04-14 MED ORDER — LIDOCAINE HCL (CARDIAC) PF 100 MG/5ML IV SOSY
PREFILLED_SYRINGE | INTRAVENOUS | Status: DC | PRN
  Administered 2019-04-14: 60 mg via INTRATRACHEAL

## 2019-04-14 MED ORDER — PROPOFOL 10 MG/ML IV BOLUS
INTRAVENOUS | Status: AC
  Filled 2019-04-14: qty 20

## 2019-04-14 MED ORDER — ALUM & MAG HYDROXIDE-SIMETH 200-200-20 MG/5ML PO SUSP
30.0000 mL | ORAL | Status: DC | PRN
  Administered 2019-04-21: 16:00:00 30 mL via ORAL
  Filled 2019-04-14: qty 30

## 2019-04-14 MED ORDER — HYDROCODONE-ACETAMINOPHEN 7.5-325 MG PO TABS
1.0000 | ORAL_TABLET | ORAL | Status: DC | PRN
  Administered 2019-04-18: 2 via ORAL
  Filled 2019-04-14: qty 2

## 2019-04-14 MED ORDER — OXYCODONE HCL 5 MG/5ML PO SOLN: 5.0000 mg | Freq: Once | ORAL | Status: DC | PRN

## 2019-04-14 MED ORDER — HYDROMORPHONE HCL 1 MG/ML IJ SOLN: 0.2500 mg | INTRAMUSCULAR | Status: DC | PRN

## 2019-04-14 MED ORDER — PROMETHAZINE HCL 25 MG/ML IJ SOLN: 6.2500 mg | INTRAMUSCULAR | Status: DC | PRN

## 2019-04-14 MED ORDER — MORPHINE SULFATE (PF) 2 MG/ML IV SOLN: 1.0000 mg | INTRAVENOUS | Status: DC | PRN

## 2019-04-14 MED ORDER — ENOXAPARIN SODIUM 40 MG/0.4ML ~~LOC~~ SOLN: 40.0000 mg | Freq: Every day | SUBCUTANEOUS | 13 refills | Status: DC

## 2019-04-14 MED ORDER — ALBUMIN HUMAN 5 % IV SOLN: INTRAVENOUS | Status: DC | PRN

## 2019-04-14 MED ORDER — LACTATED RINGERS IV SOLN: INTRAVENOUS | Status: DC

## 2019-04-14 MED ORDER — OXYCODONE HCL 5 MG PO TABS: 5.0000 mg | ORAL_TABLET | Freq: Once | ORAL | Status: DC | PRN

## 2019-04-14 MED ORDER — ACETAMINOPHEN 325 MG PO TABS
325.0000 mg | ORAL_TABLET | Freq: Four times a day (QID) | ORAL | Status: DC | PRN
  Administered 2019-04-18 – 2019-04-22 (×3): 650 mg via ORAL
  Filled 2019-04-14 (×4): qty 2

## 2019-04-14 MED ORDER — FENTANYL CITRATE (PF) 250 MCG/5ML IJ SOLN
INTRAMUSCULAR | Status: AC
  Filled 2019-04-14: qty 5

## 2019-04-14 MED ORDER — MEPERIDINE HCL 25 MG/ML IJ SOLN: 6.2500 mg | INTRAMUSCULAR | Status: DC | PRN

## 2019-04-14 MED ORDER — MAGNESIUM CITRATE PO SOLN
1.0000 | Freq: Once | ORAL | Status: DC | PRN
  Filled 2019-04-14: qty 296

## 2019-04-14 MED ORDER — ACETAMINOPHEN 500 MG PO TABS
500.0000 mg | ORAL_TABLET | Freq: Four times a day (QID) | ORAL | Status: AC
  Administered 2019-04-14 – 2019-04-15 (×4): 500 mg via ORAL
  Filled 2019-04-14 (×4): qty 1

## 2019-04-14 MED ORDER — ENOXAPARIN SODIUM 40 MG/0.4ML ~~LOC~~ SOLN
40.0000 mg | SUBCUTANEOUS | Status: DC
  Administered 2019-04-15 – 2019-04-18 (×4): 40 mg via SUBCUTANEOUS
  Filled 2019-04-14 (×4): qty 0.4

## 2019-04-14 MED ORDER — CHLORHEXIDINE GLUCONATE CLOTH 2 % EX PADS
6.0000 | MEDICATED_PAD | Freq: Every day | CUTANEOUS | Status: DC
  Administered 2019-04-14 – 2019-05-05 (×14): 6 via TOPICAL

## 2019-04-14 MED ORDER — CEFAZOLIN SODIUM-DEXTROSE 2-4 GM/100ML-% IV SOLN
INTRAVENOUS | Status: AC
  Filled 2019-04-14: qty 100

## 2019-04-14 MED ORDER — DEXMEDETOMIDINE HCL 200 MCG/2ML IV SOLN
INTRAVENOUS | Status: DC | PRN
  Administered 2019-04-14: 12 ug via INTRAVENOUS
  Administered 2019-04-14: 8 ug via INTRAVENOUS

## 2019-04-14 MED ORDER — PHENYLEPHRINE HCL-NACL 10-0.9 MG/250ML-% IV SOLN
INTRAVENOUS | Status: DC | PRN
  Administered 2019-04-14: 50 ug/min via INTRAVENOUS

## 2019-04-14 MED ORDER — TRANEXAMIC ACID-NACL 1000-0.7 MG/100ML-% IV SOLN
1000.0000 mg | INTRAVENOUS | Status: AC
  Administered 2019-04-14: 1000 mg via INTRAVENOUS

## 2019-04-14 SURGICAL SUPPLY — 44 items
BIT DRILL SHORT 4.0 (BIT) IMPLANT
BNDG COHESIVE 4X5 TAN STRL (GAUZE/BANDAGES/DRESSINGS) ×3 IMPLANT
BNDG COHESIVE 6X5 TAN STRL LF (GAUZE/BANDAGES/DRESSINGS) IMPLANT
BNDG GAUZE ELAST 4 BULKY (GAUZE/BANDAGES/DRESSINGS) ×3 IMPLANT
COVER PERINEAL POST (MISCELLANEOUS) ×3 IMPLANT
COVER SURGICAL LIGHT HANDLE (MISCELLANEOUS) ×3 IMPLANT
COVER WAND RF STERILE (DRAPES) ×3 IMPLANT
DRAPE C-ARMOR (DRAPES) ×3 IMPLANT
DRAPE STERI IOBAN 125X83 (DRAPES) ×3 IMPLANT
DRILL BIT SHORT 4.0 (BIT) ×3
DRSG MEPILEX BORDER 4X4 (GAUZE/BANDAGES/DRESSINGS) ×3 IMPLANT
DRSG MEPILEX BORDER 4X8 (GAUZE/BANDAGES/DRESSINGS) ×3 IMPLANT
DRSG PAD ABDOMINAL 8X10 ST (GAUZE/BANDAGES/DRESSINGS) ×6 IMPLANT
DURAPREP 26ML APPLICATOR (WOUND CARE) ×3 IMPLANT
ELECT REM PT RETURN 9FT ADLT (ELECTROSURGICAL) ×3
ELECTRODE REM PT RTRN 9FT ADLT (ELECTROSURGICAL) ×1 IMPLANT
GLOVE BIOGEL PI IND STRL 7.0 (GLOVE) ×1 IMPLANT
GLOVE BIOGEL PI INDICATOR 7.0 (GLOVE) ×2
GLOVE ECLIPSE 7.0 STRL STRAW (GLOVE) ×3 IMPLANT
GLOVE SKINSENSE NS SZ7.5 (GLOVE) ×4
GLOVE SKINSENSE STRL SZ7.5 (GLOVE) ×2 IMPLANT
GOWN STRL REIN XL XLG (GOWN DISPOSABLE) ×3 IMPLANT
GUIDE PIN 3.2X343 (PIN) ×2
GUIDE PIN 3.2X343MM (PIN) ×6
KIT BASIN OR (CUSTOM PROCEDURE TRAY) ×3 IMPLANT
KIT TURNOVER KIT B (KITS) ×3 IMPLANT
MANIFOLD NEPTUNE II (INSTRUMENTS) ×3 IMPLANT
NAIL TRIGEN INTERT 11.5X40-125 (Nail) ×2 IMPLANT
NS IRRIG 1000ML POUR BTL (IV SOLUTION) ×3 IMPLANT
PACK GENERAL/GYN (CUSTOM PROCEDURE TRAY) ×3 IMPLANT
PAD ARMBOARD 7.5X6 YLW CONV (MISCELLANEOUS) ×6 IMPLANT
PAD CAST 4YDX4 CTTN HI CHSV (CAST SUPPLIES) ×2 IMPLANT
PADDING CAST COTTON 4X4 STRL (CAST SUPPLIES) ×6
PIN GUIDE 3.2X343MM (PIN) IMPLANT
SCREW LAG COMPR KIT 100/95 (Screw) ×2 IMPLANT
SCREW TRIGEN LOW PROF 5.0X45 (Screw) ×2 IMPLANT
STAPLER VISISTAT 35W (STAPLE) ×3 IMPLANT
SUT VIC AB 0 CT1 27 (SUTURE) ×3
SUT VIC AB 0 CT1 27XBRD ANBCTR (SUTURE) ×1 IMPLANT
SUT VIC AB 2-0 CT1 27 (SUTURE) ×3
SUT VIC AB 2-0 CT1 TAPERPNT 27 (SUTURE) ×1 IMPLANT
TOWEL GREEN STERILE (TOWEL DISPOSABLE) ×3 IMPLANT
TOWEL GREEN STERILE FF (TOWEL DISPOSABLE) ×3 IMPLANT
WATER STERILE IRR 1000ML POUR (IV SOLUTION) ×3 IMPLANT
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
## 2019-04-14 NOTE — Discharge Instructions (Signed)
° ° °  1. Change dressings as needed °2. May shower but keep incisions covered and dry °3. Take lovenox to prevent blood clots °4. Take stool softeners as needed °5. Take pain meds as needed ° °
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
## 2019-04-14 NOTE — Progress Notes (Signed)
OT Cancellation Note  Patient Details Name: Duane Richardson MRN: [id number] DOB: 1955-12-26   Cancelled Treatment:      Pt in sx at time OT attempted to initiate eval this AM. Will continue to follow as schedule allows   Zenovia Jarred, MSOT, OTR/L [facility] Office Number: [phone]   Zenovia Jarred 04/14/2019, 4:58 PM

## 2019-04-14 NOTE — Anesthesia Postprocedure Evaluation (Signed)
Anesthesia Post Note  Patient: Duane Richardson  Procedure(s) Performed: INTRAMEDULLARY (IM) NAIL INTERTROCHANTRIC (Right Hip)     Patient location during evaluation: PACU Anesthesia Type: General Level of consciousness: awake and alert, oriented and patient cooperative Pain management: pain level controlled Vital Signs Assessment: post-procedure vital signs reviewed and stable Respiratory status: spontaneous breathing, nonlabored ventilation and respiratory function stable Cardiovascular status: blood pressure returned to baseline and stable Postop Assessment: no apparent nausea or vomiting Anesthetic complications: no    Last Vitals:  Vitals:   04/14/19 0926 04/14/19 0927  BP: (!) 141/99   Pulse: (!) 102 (!) 103  Resp: (!) 22 19  Temp:    SpO2: 100% 100%    Last Pain:  Vitals:   04/14/19 0930  TempSrc:   PainSc: [place]

## 2019-04-14 NOTE — Plan of Care (Signed)

## 2019-04-14 NOTE — Anesthesia Procedure Notes (Signed)
Procedure Name: Intubation Date/Time: 04/14/2019 7:38 AM Performed by: Mariea Clonts, CRNA Pre-anesthesia Checklist: Patient identified, Emergency Drugs available, Suction available and Patient being monitored Patient Re-evaluated:Patient Re-evaluated prior to induction Oxygen Delivery Method: Circle System Utilized Preoxygenation: Pre-oxygenation with 100% oxygen Induction Type: IV induction Ventilation: Mask ventilation without difficulty Laryngoscope Size: Mac and 4 Grade View: Grade I Tube type: Oral Tube size: 7.5 mm Number of attempts: 1 Airway Equipment and Method: Stylet and Oral airway Placement Confirmation: ETT inserted through vocal cords under direct vision,  positive ETCO2 and breath sounds checked- equal and bilateral Tube secured with: Tape Dental Injury: Teeth and Oropharynx as per pre-operative assessment

## 2019-04-14 NOTE — Addendum Note (Signed)
Addendum  created 04/14/19 1053 by Mariea Clonts, CRNA   Flowsheet accepted, Intraprocedure Flowsheets edited
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
## 2019-04-14 NOTE — Transfer of Care (Signed)
Immediate Anesthesia Transfer of Care Note  Patient: Duane Richardson  Procedure(s) Performed: INTRAMEDULLARY (IM) NAIL INTERTROCHANTRIC (Right Hip)  Patient Location: PACU  Anesthesia Type:General  Level of Consciousness: awake, alert  and oriented  Airway & Oxygen Therapy: Patient Spontanous Breathing and Patient connected to nasal cannula oxygen  Post-op Assessment: Report given to RN and Post -op Vital signs reviewed and stable  Post vital signs: Reviewed and stable  Last Vitals:  Vitals Value Taken Time  BP 159/99 04/14/19 0911  Temp    Pulse 98 04/14/19 0912  Resp 25 04/14/19 0912  SpO2 100 % 04/14/19 0912  Vitals shown include unvalidated device data.  Last Pain:  Vitals:   04/14/19 0449  TempSrc: Oral  PainSc:          Complications: No apparent anesthesia complications

## 2019-04-15 DIAGNOSIS — F1010 Alcohol abuse, uncomplicated: Secondary | ICD-10-CM | POA: Diagnosis not present

## 2019-04-15 DIAGNOSIS — K219 Gastro-esophageal reflux disease without esophagitis: Secondary | ICD-10-CM | POA: Diagnosis not present

## 2019-04-15 DIAGNOSIS — S728X1A Other fracture of right femur, initial encounter for closed fracture: Secondary | ICD-10-CM | POA: Diagnosis not present

## 2019-04-15 DIAGNOSIS — M25551 Pain in right hip: Secondary | ICD-10-CM | POA: Diagnosis not present

## 2019-04-15 DIAGNOSIS — S72141A Displaced intertrochanteric fracture of right femur, initial encounter for closed fracture: Secondary | ICD-10-CM | POA: Diagnosis not present

## 2019-04-15 DIAGNOSIS — J449 Chronic obstructive pulmonary disease, unspecified: Secondary | ICD-10-CM | POA: Diagnosis not present

## 2019-04-15 LAB — BASIC METABOLIC PANEL
Anion gap: 11 (ref 5–15)
BUN: 10 mg/dL (ref 8–23)
CO2: 23 mmol/L (ref 22–32)
Calcium: 8.3 mg/dL — ABNORMAL LOW (ref 8.9–10.3)
Chloride: 101 mmol/L (ref 98–111)
Creatinine, Ser: 0.71 mg/dL (ref 0.61–1.24)
GFR calc Af Amer: >60 mL/min (ref >60)
GFR calc non Af Amer: >60 mL/min (ref >60)
Glucose, Bld: 128 mg/dL — ABNORMAL HIGH (ref 70–99)
Potassium: 3.9 mmol/L (ref 3.5–5.1)
Sodium: 135 mmol/L (ref 135–145)

## 2019-04-15 LAB — CBC WITH DIFFERENTIAL/PLATELET
Abs Immature Granulocytes: 0.13 10*3/uL — ABNORMAL HIGH (ref 0.00–0.07)
Basophils Absolute: 0 10*3/uL (ref 0.0–0.1)
Basophils Relative: 0 %
Eosinophils Absolute: 0 10*3/uL (ref 0.0–0.5)
Eosinophils Relative: 0 %
HCT: 27.5 % — ABNORMAL LOW (ref 39.0–52.0)
Hemoglobin: 9.3 g/dL — ABNORMAL LOW (ref 13.0–17.0)
Immature Granulocytes: 1 %
Lymphocytes Relative: 8 %
Lymphs Abs: 1.2 10*3/uL (ref 0.7–4.0)
MCH: 32.2 pg (ref 26.0–34.0)
MCHC: 33.8 g/dL (ref 30.0–36.0)
MCV: 95.2 fL (ref 80.0–100.0)
Monocytes Absolute: 4.5 10*3/uL — ABNORMAL HIGH (ref 0.1–1.0)
Monocytes Relative: 27 %
Neutro Abs: 10.7 10*3/uL — ABNORMAL HIGH (ref 1.7–7.7)
Neutrophils Relative %: 64 %
Platelets: 118 10*3/uL — ABNORMAL LOW (ref 150–400)
RBC: 2.89 MIL/uL — ABNORMAL LOW (ref 4.22–5.81)
RDW: 13.3 % (ref 11.5–15.5)
WBC: 16.6 10*3/uL — ABNORMAL HIGH (ref 4.0–10.5)
nRBC: 0 % (ref 0.0–0.2)
nRBC: 0 /100 WBC

## 2019-04-15 LAB — PATHOLOGIST SMEAR REVIEW

## 2019-04-15 MED ORDER — NICOTINE POLACRILEX 2 MG MT GUM
2.0000 mg | CHEWING_GUM | OROMUCOSAL | Status: DC | PRN
  Administered 2019-04-15: 2 mg via ORAL
  Filled 2019-04-15 (×2): qty 1

## 2019-04-15 NOTE — Plan of Care (Signed)

## 2019-04-15 NOTE — TOC Initial Note (Signed)
Transition of Care [facility]) - Initial/Assessment Note    Patient Details  Name: Duane Richardson MRN: [id number] Date of Birth: 1955/06/06  [facility]) CM/SW Contact:    Carles Collet, RN Phone Number: 04/15/2019, 2:56 PM  Clinical Narrative:              Damaris Schooner w patient on the phone to discuss disposition. He was not oriented at this time. Spoke w his nurse who confirms that he just received Ativan for CIWA score of 10. TOC will continue to follow and work up for SNF as patient clears mentally.      Expected Discharge Plan: [place]     Patient Goals and CMS Choice        Expected Discharge Plan and Services Expected Discharge Plan: [place]                                              Prior Living Arrangements/Services                       Activities of Daily Living Home Assistive Devices/Equipment: Gilford Rile (specify type) ADL Screening (condition at time of admission) Patient's cognitive ability adequate to safely complete daily activities?: Yes Is the patient deaf or have difficulty hearing?: No Does the patient have difficulty seeing, even when wearing glasses/contacts?: No Does the patient have difficulty concentrating, remembering, or making decisions?: No Patient able to express need for assistance with ADLs?: Yes Does the patient have difficulty dressing or bathing?: No Independently performs ADLs?: Yes (appropriate for developmental age) Does the patient have difficulty walking or climbing stairs?: Yes Weakness of Legs: Right Weakness of Arms/Hands: None  Permission Sought/Granted                  Emotional Assessment              Admission diagnosis:  Other fracture of right femur, initial encounter for closed fracture ([place]) [S72.8X1A] Closed fracture of right hip, initial encounter ([place]) [D17.616W] Acute alcoholic intoxication without complication ([place]) [V37.106] Patient Active Problem  List   Diagnosis Date Noted  . Other fracture of right femur, initial encounter for closed fracture ([place]) 04/13/2019  . Atherosclerosis   . Acute colitis 12/05/2015  . Syncope 12/05/2015  . Hypokalemia 12/05/2015  . Hypomagnesemia 12/05/2015  . Thrombocytopenia ([place]) 12/05/2015  . Alcohol withdrawal ([place]) 12/04/2015  . Generalized weakness 12/04/2015  . Abdominal pain 12/04/2015  . Nausea & vomiting 12/04/2015  . Arthritis of left hip 06/07/2013  . Status post THR (total hip replacement) 06/07/2013  . BPH (benign prostatic hypertrophy) with urinary obstruction 03/22/2013  . Protein-calorie malnutrition, severe ([place]) 11/30/2012  . Hip fracture ([place]) 11/23/2012  . HTN (hypertension) 11/23/2012  . Steatosis of liver 11/15/2012  . Anorexia nervosa 10/13/2012  . Loss of weight 07/16/2012  . ETOH abuse   . Weight loss 02/17/2012  . Dysphagia 12/30/2011  . Oral candida 12/30/2011  . Rectal bleed 12/30/2011  . Unintentional weight loss 12/30/2011  . Hx of adenomatous colonic polyps 12/30/2011  . Leucopenia 07/08/2007  . COPD (chronic obstructive pulmonary disease) ([place]) 12/21/2006  . LIVER FUNCTION TESTS, ABNORMAL 11/09/2006  . HYPERLIPIDEMIA 05/27/2006  . DISORDER, BIPOLAR NOS 05/27/2006  . TOBACCO ABUSE 05/27/2006  . Depression 05/27/2006  . CATARACT NOS 05/27/2006  . ALLERGIC RHINITIS 05/27/2006  .  ASTHMA 05/27/2006  . GERD 05/27/2006  . ARTHRITIS 05/27/2006  . LOW BACK PAIN, CHRONIC 05/27/2006  . MALAISE AND FATIGUE 05/27/2006   PCP:  [name], MD Pharmacy:   [facility], [place] [address] [place] [address] Phone: [phone] Fax: [phone]     Social Determinants of Health (SDOH) Interventions    Readmission Risk Interventions No flowsheet data found.

## 2019-04-15 NOTE — Progress Notes (Signed)
Subjective: 1 Day Post-Op Procedure(s) (LRB): INTRAMEDULLARY (IM) NAIL INTERTROCHANTRIC (Right) Patient reports pain as mild.    Objective: Vital signs in last 24 hours: Temp:  [97.9 F (36.6 C)-99.7 F (37.6 C)] 98.9 F (37.2 C) (02/19 0820) Pulse Rate:  [100-112] 105 (02/19 0820) Resp:  [16-23] 16 (02/19 0820) BP: (106-141)/(70-100) 106/70 (02/19 0820) SpO2:  [96 %-100 %] 99 % (02/19 0820)  Intake/Output from previous day: 02/18 0701 - 02/19 0700 In: 1673.7 [I.V.:1123.7; IV Piggyback:550] Out: 0258 [Urine:1050; Blood:300] Intake/Output this shift: No intake/output data recorded.  Recent Labs    04/12/19 2218 04/13/19 0024 04/14/19 0116 04/14/19 1051 04/15/19 0405  HGB 13.6 12.9* 11.4* 11.1* 9.3*   Recent Labs    04/14/19 1051 04/15/19 0405  WBC 11.4* 16.6*  RBC 3.46* 2.89*  HCT 33.2* 27.5*  PLT 137* 118*   Recent Labs    04/14/19 0116 04/14/19 0116 04/14/19 1051 04/15/19 0405  NA 138  --   --  135  K 4.2  --   --  3.9  CL 106  --   --  101  CO2 24  --   --  23  BUN 8  --   --  10  CREATININE 0.68   < > 0.76 0.71  GLUCOSE 133*  --   --  128*  CALCIUM 8.6*  --   --  8.3*   < > = values in this interval not displayed.   No results for input(s): LABPT, INR in the last 72 hours.  Neurologically intact Neurovascular intact Sensation intact distally Intact pulses distally Dorsiflexion/Plantar flexion intact Incision: dressing C/D/I No cellulitis present   Assessment/Plan: 1 Day Post-Op Procedure(s) (LRB): INTRAMEDULLARY (IM) NAIL INTERTROCHANTRIC (Right) Up with therapy  WBAT RLE ABLA- mild and stable Lovenox 40 daily for dvt ppx F/u with Dr. [name] 2 weeks post-op      Duane Richardson 04/15/2019, 9:19 AM
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
## 2019-04-15 NOTE — Progress Notes (Signed)
Removed foley @0640 . Pt had minimal red drainage and complained of tenderness. Urine also had a red hue to it. Will pass on to day shift nurse and make aware.
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
## 2019-04-16 ENCOUNTER — Inpatient Hospital Stay (HOSPITAL_COMMUNITY): Payer: Medicare Other

## 2019-04-16 DIAGNOSIS — M25551 Pain in right hip: Secondary | ICD-10-CM | POA: Diagnosis not present

## 2019-04-16 DIAGNOSIS — S72141A Displaced intertrochanteric fracture of right femur, initial encounter for closed fracture: Secondary | ICD-10-CM | POA: Diagnosis not present

## 2019-04-16 DIAGNOSIS — K219 Gastro-esophageal reflux disease without esophagitis: Secondary | ICD-10-CM | POA: Diagnosis not present

## 2019-04-16 DIAGNOSIS — J449 Chronic obstructive pulmonary disease, unspecified: Secondary | ICD-10-CM | POA: Diagnosis not present

## 2019-04-16 DIAGNOSIS — F1010 Alcohol abuse, uncomplicated: Secondary | ICD-10-CM | POA: Diagnosis not present

## 2019-04-16 DIAGNOSIS — S728X1A Other fracture of right femur, initial encounter for closed fracture: Secondary | ICD-10-CM | POA: Diagnosis not present

## 2019-04-16 LAB — CBC WITH DIFFERENTIAL/PLATELET
Abs Immature Granulocytes: 0 10*3/uL (ref 0.00–0.07)
Basophils Absolute: 0 10*3/uL (ref 0.0–0.1)
Basophils Relative: 0 %
Eosinophils Absolute: 0 10*3/uL (ref 0.0–0.5)
Eosinophils Relative: 0 %
HCT: 26.8 % — ABNORMAL LOW (ref 39.0–52.0)
Hemoglobin: 9 g/dL — ABNORMAL LOW (ref 13.0–17.0)
Lymphocytes Relative: 7 %
Lymphs Abs: 1.2 10*3/uL (ref 0.7–4.0)
MCH: 31.8 pg (ref 26.0–34.0)
MCHC: 33.6 g/dL (ref 30.0–36.0)
MCV: 94.7 fL (ref 80.0–100.0)
Monocytes Absolute: 3.1 10*3/uL — ABNORMAL HIGH (ref 0.1–1.0)
Monocytes Relative: 18 %
Neutro Abs: 13 10*3/uL — ABNORMAL HIGH (ref 1.7–7.7)
Neutrophils Relative %: 75 %
Platelets: 130 10*3/uL — ABNORMAL LOW (ref 150–400)
RBC: 2.83 MIL/uL — ABNORMAL LOW (ref 4.22–5.81)
RDW: 13 % (ref 11.5–15.5)
WBC: 17.3 10*3/uL — ABNORMAL HIGH (ref 4.0–10.5)
nRBC: 0 % (ref 0.0–0.2)
nRBC: 0 /100 WBC

## 2019-04-16 LAB — BASIC METABOLIC PANEL
Anion gap: 10 (ref 5–15)
BUN: 11 mg/dL (ref 8–23)
CO2: 22 mmol/L (ref 22–32)
Calcium: 8.2 mg/dL — ABNORMAL LOW (ref 8.9–10.3)
Chloride: 99 mmol/L (ref 98–111)
Creatinine, Ser: 0.62 mg/dL (ref 0.61–1.24)
GFR calc Af Amer: >60 mL/min (ref >60)
GFR calc non Af Amer: >60 mL/min (ref >60)
Glucose, Bld: 90 mg/dL (ref 70–99)
Potassium: 3.7 mmol/L (ref 3.5–5.1)
Sodium: 131 mmol/L — ABNORMAL LOW (ref 135–145)

## 2019-04-16 LAB — IRON AND TIBC
Iron: 11 ug/dL — ABNORMAL LOW (ref 45–182)
Saturation Ratios: 5 % — ABNORMAL LOW (ref 17.9–39.5)
TIBC: 204 ug/dL — ABNORMAL LOW (ref 250–450)
UIBC: 193 ug/dL

## 2019-04-16 LAB — FERRITIN: Ferritin: 1052 ng/mL — ABNORMAL HIGH (ref 24–336)

## 2019-04-16 LAB — URINE CULTURE: Culture: 60000 — AB

## 2019-04-16 LAB — VITAMIN B12: Vitamin B-12: 108 pg/mL — ABNORMAL LOW (ref 180–914)

## 2019-04-16 LAB — FOLATE: Folate: 18.3 ng/mL (ref >5.9)

## 2019-04-16 MED ORDER — LORAZEPAM 2 MG/ML IJ SOLN
1.0000 mg | INTRAMUSCULAR | Status: DC | PRN
  Filled 2019-04-16: qty 1

## 2019-04-16 MED ORDER — LORAZEPAM 2 MG/ML IJ SOLN: 1.0000 mg | INTRAMUSCULAR | Status: AC | PRN

## 2019-04-16 MED ORDER — LORAZEPAM 1 MG PO TABS: 1.0000 mg | ORAL_TABLET | ORAL | Status: DC | PRN

## 2019-04-16 MED ORDER — LORAZEPAM 1 MG PO TABS
1.0000 mg | ORAL_TABLET | ORAL | Status: AC | PRN
  Administered 2019-04-16: 2 mg via ORAL
  Administered 2019-04-17: 1 mg via ORAL
  Filled 2019-04-16: qty 2
  Filled 2019-04-16 (×2): qty 1

## 2019-04-16 MED ORDER — VITAMIN B-12 1000 MCG PO TABS
1000.0000 ug | ORAL_TABLET | Freq: Every day | ORAL | Status: DC
  Administered 2019-04-17 – 2019-05-05 (×13): 1000 ug via ORAL
  Filled 2019-04-16 (×17): qty 1

## 2019-04-16 MED ORDER — DIAZEPAM 5 MG/ML IJ SOLN
10.0000 mg | Freq: Once | INTRAMUSCULAR | Status: AC
  Administered 2019-04-16: 10 mg via INTRAVENOUS
  Filled 2019-04-16: qty 2

## 2019-04-16 MED ORDER — SODIUM CHLORIDE 0.9 % IV SOLN
510.0000 mg | Freq: Once | INTRAVENOUS | Status: DC
  Filled 2019-04-16: qty 17

## 2019-04-16 NOTE — Plan of Care (Signed)
  Problem: Safety: Goal: Ability to remain free from injury will improve Outcome: Progressing   Problem: Elimination: Goal: Will not experience complications related to urinary retention Outcome: Progressing   Problem: Coping: Goal: Level of anxiety will decrease Outcome: Progressing   Problem: Nutrition: Goal: Adequate nutrition will be maintained Outcome: Progressing   Problem: Clinical Measurements: Goal: Cardiovascular complication will be avoided Outcome: Progressing   Problem: Clinical Measurements: Goal: Ability to maintain clinical measurements within normal limits will improve Outcome: Progressing   Problem: Health Behavior/Discharge Planning: Goal: Ability to manage health-related needs will improve Outcome: Progressing

## 2019-04-16 NOTE — Progress Notes (Signed)
Notified that patient has fever and is refusing meds. Advised to alert MD. Please call RRT if assistance needed.

## 2019-04-16 NOTE — Progress Notes (Addendum)
Pt pulling off monitor. Pt soiled in urine, refuses to allow staff to admin personal hygiene. Patient noted with tremors, agitation towards male staff. See CIWA score. Needed male staff to help assist for safety. Noted CIWA coverage with Ativan parameters expired. Text out to covering MD.   See orders for one time dose of Valium 10mg  IV.
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
## 2019-04-16 NOTE — Plan of Care (Signed)
  Problem: Safety: Goal: Ability to remain free from injury will improve Outcome: Progressing   

## 2019-04-16 NOTE — Progress Notes (Signed)
Pt has refused x-ray, morning labs, vital signs, and all medications oral and IV. Pt very aggressive with staff this morning. Provider notified.

## 2019-04-16 NOTE — Progress Notes (Signed)
Patient has elevated temperature of 101.3 also CIWA score of 9 . He is refusing to take all meds on call physician notified, rapid response and charge nurse notified.

## 2019-04-17 ENCOUNTER — Encounter (HOSPITAL_COMMUNITY): Payer: Self-pay | Admitting: Internal Medicine

## 2019-04-17 ENCOUNTER — Inpatient Hospital Stay (HOSPITAL_COMMUNITY): Payer: Medicare Other

## 2019-04-17 DIAGNOSIS — K219 Gastro-esophageal reflux disease without esophagitis: Secondary | ICD-10-CM | POA: Diagnosis not present

## 2019-04-17 DIAGNOSIS — M25551 Pain in right hip: Secondary | ICD-10-CM | POA: Diagnosis not present

## 2019-04-17 DIAGNOSIS — S72141A Displaced intertrochanteric fracture of right femur, initial encounter for closed fracture: Secondary | ICD-10-CM | POA: Diagnosis not present

## 2019-04-17 DIAGNOSIS — J449 Chronic obstructive pulmonary disease, unspecified: Secondary | ICD-10-CM | POA: Diagnosis not present

## 2019-04-17 DIAGNOSIS — S728X1A Other fracture of right femur, initial encounter for closed fracture: Secondary | ICD-10-CM | POA: Diagnosis not present

## 2019-04-17 DIAGNOSIS — F1010 Alcohol abuse, uncomplicated: Secondary | ICD-10-CM | POA: Diagnosis not present

## 2019-04-17 LAB — CBC WITH DIFFERENTIAL/PLATELET
Abs Immature Granulocytes: 0 10*3/uL (ref 0.00–0.07)
Basophils Absolute: 0 10*3/uL (ref 0.0–0.1)
Basophils Relative: 0 %
Eosinophils Absolute: 0 10*3/uL (ref 0.0–0.5)
Eosinophils Relative: 0 %
HCT: 26.9 % — ABNORMAL LOW (ref 39.0–52.0)
Hemoglobin: 9 g/dL — ABNORMAL LOW (ref 13.0–17.0)
Lymphocytes Relative: 10 %
Lymphs Abs: 1.8 10*3/uL (ref 0.7–4.0)
MCH: 31.6 pg (ref 26.0–34.0)
MCHC: 33.5 g/dL (ref 30.0–36.0)
MCV: 94.4 fL (ref 80.0–100.0)
Monocytes Absolute: 3.1 10*3/uL — ABNORMAL HIGH (ref 0.1–1.0)
Monocytes Relative: 17 %
Neutro Abs: 13.2 10*3/uL — ABNORMAL HIGH (ref 1.7–7.7)
Neutrophils Relative %: 73 %
Platelets: 168 10*3/uL (ref 150–400)
RBC: 2.85 MIL/uL — ABNORMAL LOW (ref 4.22–5.81)
RDW: 12.8 % (ref 11.5–15.5)
WBC: 18.1 10*3/uL — ABNORMAL HIGH (ref 4.0–10.5)
nRBC: 0 % (ref 0.0–0.2)
nRBC: 0 /100 WBC

## 2019-04-17 LAB — CBC
HCT: 25.6 % — ABNORMAL LOW (ref 39.0–52.0)
Hemoglobin: 8.6 g/dL — ABNORMAL LOW (ref 13.0–17.0)
MCH: 31.9 pg (ref 26.0–34.0)
MCHC: 33.6 g/dL (ref 30.0–36.0)
MCV: 94.8 fL (ref 80.0–100.0)
Platelets: 175 10*3/uL (ref 150–400)
RBC: 2.7 MIL/uL — ABNORMAL LOW (ref 4.22–5.81)
RDW: 12.7 % (ref 11.5–15.5)
WBC: 16.5 10*3/uL — ABNORMAL HIGH (ref 4.0–10.5)
nRBC: 0 % (ref 0.0–0.2)

## 2019-04-17 LAB — URINALYSIS, ROUTINE W REFLEX MICROSCOPIC
Bilirubin Urine: NEGATIVE
Glucose, UA: NEGATIVE mg/dL
Hgb urine dipstick: NEGATIVE
Ketones, ur: 20 mg/dL — AB
Leukocytes,Ua: NEGATIVE
Nitrite: NEGATIVE
Protein, ur: NEGATIVE mg/dL
Specific Gravity, Urine: 1.015 (ref 1.005–1.030)
pH: 6 (ref 5.0–8.0)

## 2019-04-17 LAB — BASIC METABOLIC PANEL
Anion gap: 13 (ref 5–15)
BUN: 11 mg/dL (ref 8–23)
CO2: 20 mmol/L — ABNORMAL LOW (ref 22–32)
Calcium: 8.3 mg/dL — ABNORMAL LOW (ref 8.9–10.3)
Chloride: 100 mmol/L (ref 98–111)
Creatinine, Ser: 0.72 mg/dL (ref 0.61–1.24)
GFR calc Af Amer: >60 mL/min (ref >60)
GFR calc non Af Amer: >60 mL/min (ref >60)
Glucose, Bld: 89 mg/dL (ref 70–99)
Potassium: 4 mmol/L (ref 3.5–5.1)
Sodium: 133 mmol/L — ABNORMAL LOW (ref 135–145)

## 2019-04-17 LAB — BASIC METABOLIC PANEL WITH GFR
Anion gap: 11 (ref 5–15)
BUN: 15 mg/dL (ref 8–23)
CO2: 22 mmol/L (ref 22–32)
Calcium: 7.9 mg/dL — ABNORMAL LOW (ref 8.9–10.3)
Chloride: 99 mmol/L (ref 98–111)
Creatinine, Ser: 0.69 mg/dL (ref 0.61–1.24)
GFR calc Af Amer: >60 mL/min
GFR calc non Af Amer: >60 mL/min
Glucose, Bld: 133 mg/dL — ABNORMAL HIGH (ref 70–99)
Potassium: 3.7 mmol/L (ref 3.5–5.1)
Sodium: 132 mmol/L — ABNORMAL LOW (ref 135–145)

## 2019-04-17 LAB — PROCALCITONIN: Procalcitonin: 0.18 ng/mL

## 2019-04-17 LAB — LACTIC ACID, PLASMA
Lactic Acid, Venous: 2.2 mmol/L (ref 0.5–1.9)
Lactic Acid, Venous: 1.6 mmol/L (ref 0.5–1.9)

## 2019-04-17 MED ORDER — IOHEXOL 300 MG/ML  SOLN
80.0000 mL | Freq: Once | INTRAMUSCULAR | Status: AC | PRN
  Administered 2019-04-17: 80 mL via INTRAVENOUS

## 2019-04-17 MED ORDER — SODIUM CHLORIDE 0.9 % IV BOLUS
1000.0000 mL | Freq: Once | INTRAVENOUS | Status: AC
  Administered 2019-04-17: 1000 mL via INTRAVENOUS

## 2019-04-17 MED ORDER — VANCOMYCIN HCL 1500 MG/300ML IV SOLN
1500.0000 mg | Freq: Once | INTRAVENOUS | Status: AC
  Administered 2019-04-17: 1500 mg via INTRAVENOUS
  Filled 2019-04-17: qty 300

## 2019-04-17 MED ORDER — SODIUM CHLORIDE 0.9 % IV SOLN
510.0000 mg | Freq: Once | INTRAVENOUS | Status: AC
  Administered 2019-04-18: 510 mg via INTRAVENOUS
  Filled 2019-04-17: qty 17

## 2019-04-17 MED ORDER — SODIUM CHLORIDE 0.9 % IV SOLN: INTRAVENOUS | Status: DC

## 2019-04-17 MED ORDER — SODIUM CHLORIDE 0.9 % IV SOLN
1.0000 g | INTRAVENOUS | Status: DC
  Administered 2019-04-17: 1 g via INTRAVENOUS
  Filled 2019-04-17: qty 10

## 2019-04-17 MED ORDER — SODIUM CHLORIDE 0.9 % IV SOLN
2.0000 g | Freq: Three times a day (TID) | INTRAVENOUS | Status: DC
  Administered 2019-04-17 – 2019-04-19 (×5): 2 g via INTRAVENOUS
  Filled 2019-04-17 (×7): qty 2

## 2019-04-17 MED ORDER — VANCOMYCIN HCL IN DEXTROSE 1-5 GM/200ML-% IV SOLN
1000.0000 mg | Freq: Two times a day (BID) | INTRAVENOUS | Status: DC
  Administered 2019-04-18 – 2019-04-19 (×3): 1000 mg via INTRAVENOUS
  Filled 2019-04-17 (×3): qty 200

## 2019-04-17 NOTE — Plan of Care (Signed)
°  Problem: Coping: °Goal: Level of anxiety will decrease °Outcome: Progressing °  °

## 2019-04-17 NOTE — Progress Notes (Signed)
Pt's most recent vital signs triggered a red MEWS score of 5. Temp 101.7, blood pressure 96/70, RR 25, and pulse 111. Dr. Horris Latino notified, new orders placed. Pt will be transferred to a progressive floor and NS bolus ordered. Will continue to monitor.
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
## 2019-04-17 NOTE — Progress Notes (Signed)
Report given to nurse on 4E, pt going to 4E05.

## 2019-04-17 NOTE — Progress Notes (Signed)
Pharmacy Antibiotic Note  Duane Richardson is a 64 y.o. male admitted on 04/12/2019 with hip pain with fracture on imaging, now with fever and leukocytosis, concern for sepsis.  Pharmacy has been consulted for vancomycin and cefepime dosing.  Plan: Vancomycin 1500 mg IV x 1, then 1000 mg IV every 12 hours Goal AUC 400-550. Expected AUC: 504 SCr used: 0.8 Cefepime 2g IV every 8 hours Monitor renal function, Cx and clinical progression to narrow Vancomycin levels at steady state   Height: 5\' 8"  (172.7 cm) Weight: 155 lb (70.3 kg) IBW/kg (Calculated) : 68.4  Temp (24hrs), Avg:99.6 F (37.6 C), Min:97.9 F (36.6 C), Max:101.7 F (38.7 C)  Recent Labs  Lab 04/14/19 0116 04/14/19 1051 04/15/19 0405 04/16/19 0342 04/17/19 0343  WBC 11.3* 11.4* 16.6* 17.3* 18.1*  CREATININE 0.68 0.76 0.71 0.62 0.72    Estimated Creatinine Clearance: 89.1 mL/min (by C-G formula based on SCr of 0.72 mg/dL).    No Known Allergies  Antimicrobials this admission: Ctx x 1 2/21 Cefepime 2/21>> Vanco 2/21>>  Dose adjustments this admission: n/a  Microbiology results: 2/20 BCx: ngtd 2/20 UCx: sent 2/16 UCx: 60K aerococcus  Bertis Ruddy, PharmD Clinical Pharmacist Please check AMION for all [place] numbers 04/17/2019 5:32 PM

## 2019-04-18 ENCOUNTER — Encounter: Payer: Self-pay | Admitting: *Deleted

## 2019-04-18 DIAGNOSIS — J449 Chronic obstructive pulmonary disease, unspecified: Secondary | ICD-10-CM | POA: Diagnosis not present

## 2019-04-18 DIAGNOSIS — K219 Gastro-esophageal reflux disease without esophagitis: Secondary | ICD-10-CM | POA: Diagnosis not present

## 2019-04-18 DIAGNOSIS — M25551 Pain in right hip: Secondary | ICD-10-CM | POA: Diagnosis not present

## 2019-04-18 DIAGNOSIS — F1010 Alcohol abuse, uncomplicated: Secondary | ICD-10-CM | POA: Diagnosis not present

## 2019-04-18 DIAGNOSIS — S728X1A Other fracture of right femur, initial encounter for closed fracture: Secondary | ICD-10-CM | POA: Diagnosis not present

## 2019-04-18 DIAGNOSIS — S72141A Displaced intertrochanteric fracture of right femur, initial encounter for closed fracture: Secondary | ICD-10-CM | POA: Diagnosis not present

## 2019-04-18 LAB — CBC WITH DIFFERENTIAL/PLATELET
Abs Immature Granulocytes: 0.28 10*3/uL — ABNORMAL HIGH (ref 0.00–0.07)
Basophils Absolute: 0 10*3/uL (ref 0.0–0.1)
Basophils Relative: 0 %
Eosinophils Absolute: 0.1 10*3/uL (ref 0.0–0.5)
Eosinophils Relative: 0 %
HCT: 22.5 % — ABNORMAL LOW (ref 39.0–52.0)
Hemoglobin: 7.7 g/dL — ABNORMAL LOW (ref 13.0–17.0)
Immature Granulocytes: 2 %
Lymphocytes Relative: 10 %
Lymphs Abs: 1.6 10*3/uL (ref 0.7–4.0)
MCH: 32 pg (ref 26.0–34.0)
MCHC: 34.2 g/dL (ref 30.0–36.0)
MCV: 93.4 fL (ref 80.0–100.0)
Monocytes Absolute: 4.2 10*3/uL — ABNORMAL HIGH (ref 0.1–1.0)
Monocytes Relative: 26 %
Neutro Abs: 10 10*3/uL — ABNORMAL HIGH (ref 1.7–7.7)
Neutrophils Relative %: 62 %
Platelets: 163 10*3/uL (ref 150–400)
RBC: 2.41 MIL/uL — ABNORMAL LOW (ref 4.22–5.81)
RDW: 12.6 % (ref 11.5–15.5)
WBC: 16.1 10*3/uL — ABNORMAL HIGH (ref 4.0–10.5)
nRBC: 0 % (ref 0.0–0.2)

## 2019-04-18 LAB — CBC
HCT: 24.7 % — ABNORMAL LOW (ref 39.0–52.0)
Hemoglobin: 8.2 g/dL — ABNORMAL LOW (ref 13.0–17.0)
MCH: 31.8 pg (ref 26.0–34.0)
MCHC: 33.2 g/dL (ref 30.0–36.0)
MCV: 95.7 fL (ref 80.0–100.0)
Platelets: 184 10*3/uL (ref 150–400)
RBC: 2.58 MIL/uL — ABNORMAL LOW (ref 4.22–5.81)
RDW: 12.6 % (ref 11.5–15.5)
WBC: 16.3 10*3/uL — ABNORMAL HIGH (ref 4.0–10.5)
nRBC: 0 % (ref 0.0–0.2)

## 2019-04-18 LAB — BASIC METABOLIC PANEL
Anion gap: 10 (ref 5–15)
BUN: 14 mg/dL (ref 8–23)
CO2: 19 mmol/L — ABNORMAL LOW (ref 22–32)
Calcium: 7.7 mg/dL — ABNORMAL LOW (ref 8.9–10.3)
Chloride: 101 mmol/L (ref 98–111)
Creatinine, Ser: 0.66 mg/dL (ref 0.61–1.24)
GFR calc Af Amer: >60 mL/min (ref >60)
GFR calc non Af Amer: >60 mL/min (ref >60)
Glucose, Bld: 111 mg/dL — ABNORMAL HIGH (ref 70–99)
Potassium: 3.6 mmol/L (ref 3.5–5.1)
Sodium: 130 mmol/L — ABNORMAL LOW (ref 135–145)

## 2019-04-18 LAB — PROCALCITONIN: Procalcitonin: 0.17 ng/mL

## 2019-04-18 MED ORDER — POLYETHYLENE GLYCOL 3350 17 G PO PACK
17.0000 g | PACK | Freq: Two times a day (BID) | ORAL | Status: DC
  Administered 2019-04-18 – 2019-04-23 (×9): 17 g via ORAL
  Filled 2019-04-18 (×13): qty 1
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
## 2019-04-19 DIAGNOSIS — S728X1A Other fracture of right femur, initial encounter for closed fracture: Secondary | ICD-10-CM | POA: Diagnosis not present

## 2019-04-19 DIAGNOSIS — M25551 Pain in right hip: Secondary | ICD-10-CM | POA: Diagnosis not present

## 2019-04-19 DIAGNOSIS — F1010 Alcohol abuse, uncomplicated: Secondary | ICD-10-CM | POA: Diagnosis not present

## 2019-04-19 DIAGNOSIS — K219 Gastro-esophageal reflux disease without esophagitis: Secondary | ICD-10-CM | POA: Diagnosis not present

## 2019-04-19 DIAGNOSIS — J449 Chronic obstructive pulmonary disease, unspecified: Secondary | ICD-10-CM | POA: Diagnosis not present

## 2019-04-19 DIAGNOSIS — S72141A Displaced intertrochanteric fracture of right femur, initial encounter for closed fracture: Secondary | ICD-10-CM | POA: Diagnosis not present

## 2019-04-19 LAB — URINE CULTURE: Culture: 20000 — AB

## 2019-04-19 LAB — CBC WITH DIFFERENTIAL/PLATELET
Abs Immature Granulocytes: 0 10*3/uL (ref 0.00–0.07)
Basophils Absolute: 0 10*3/uL (ref 0.0–0.1)
Basophils Relative: 0 %
Eosinophils Absolute: 0 10*3/uL (ref 0.0–0.5)
Eosinophils Relative: 0 %
HCT: 22.6 % — ABNORMAL LOW (ref 39.0–52.0)
Hemoglobin: 7.5 g/dL — ABNORMAL LOW (ref 13.0–17.0)
Lymphocytes Relative: 9 %
Lymphs Abs: 1.4 10*3/uL (ref 0.7–4.0)
MCH: 31.6 pg (ref 26.0–34.0)
MCHC: 33.2 g/dL (ref 30.0–36.0)
MCV: 95.4 fL (ref 80.0–100.0)
Monocytes Absolute: 1.1 10*3/uL — ABNORMAL HIGH (ref 0.1–1.0)
Monocytes Relative: 7 %
Neutro Abs: 13.3 10*3/uL — ABNORMAL HIGH (ref 1.7–7.7)
Neutrophils Relative %: 84 %
Platelets: 198 10*3/uL (ref 150–400)
RBC: 2.37 MIL/uL — ABNORMAL LOW (ref 4.22–5.81)
RDW: 12.8 % (ref 11.5–15.5)
WBC: 15.8 10*3/uL — ABNORMAL HIGH (ref 4.0–10.5)
nRBC: 0.1 % (ref 0.0–0.2)

## 2019-04-19 LAB — BASIC METABOLIC PANEL
Anion gap: 8 (ref 5–15)
BUN: 9 mg/dL (ref 8–23)
CO2: 20 mmol/L — ABNORMAL LOW (ref 22–32)
Calcium: 7.9 mg/dL — ABNORMAL LOW (ref 8.9–10.3)
Chloride: 105 mmol/L (ref 98–111)
Creatinine, Ser: 0.57 mg/dL — ABNORMAL LOW (ref 0.61–1.24)
GFR calc Af Amer: >60 mL/min (ref >60)
GFR calc non Af Amer: >60 mL/min (ref >60)
Glucose, Bld: 113 mg/dL — ABNORMAL HIGH (ref 70–99)
Potassium: 3.6 mmol/L (ref 3.5–5.1)
Sodium: 133 mmol/L — ABNORMAL LOW (ref 135–145)

## 2019-04-19 LAB — PREPARE RBC (CROSSMATCH)

## 2019-04-19 LAB — HEMOGLOBIN AND HEMATOCRIT, BLOOD
HCT: 32.2 % — ABNORMAL LOW (ref 39.0–52.0)
Hemoglobin: 10.9 g/dL — ABNORMAL LOW (ref 13.0–17.0)

## 2019-04-19 LAB — PROCALCITONIN: Procalcitonin: 0.14 ng/mL

## 2019-04-19 MED ORDER — SODIUM CHLORIDE 0.9% IV SOLUTION: Freq: Once | INTRAVENOUS | Status: AC

## 2019-04-19 MED ORDER — SODIUM CHLORIDE 0.9 % IV SOLN
2.0000 g | Freq: Two times a day (BID) | INTRAVENOUS | Status: DC
  Administered 2019-04-19 – 2019-04-20 (×2): 2 g via INTRAVENOUS
  Filled 2019-04-19 (×4): qty 2

## 2019-04-19 NOTE — Progress Notes (Signed)
Transferred from 4 E via bed. Alert, oriented, blood transfusion ongoing. Condom catheter in situ. Dressing right leg dry and intact. Verbalized needs and attended to.

## 2019-04-19 NOTE — NC FL2 (Signed)
[place] LEVEL OF CARE SCREENING TOOL     IDENTIFICATION  Patient Name: Duane Richardson Birthdate: [birth year] Sex: male Admission Date (Current Location): 04/12/2019  [facility] and [place] Number:  [employer] and Address:  [facility] [place], [place] [address] [place], [place] [address]      Provider Number: [id number]  Attending Physician Name and Address:  Alma Friendly, MD  Relative Name and Phone Number:       Current Level of Care: Hospital Recommended Level of Care: [place] Prior Approval Number:    Date Approved/Denied:   PASRR Number: pending  Discharge Plan: SNF    Current Diagnoses: Patient Active Problem List   Diagnosis Date Noted  . Other fracture of right femur, initial encounter for closed fracture ([place]) 04/13/2019  . Atherosclerosis   . Acute colitis 12/05/2015  . Syncope 12/05/2015  . Hypokalemia 12/05/2015  . Hypomagnesemia 12/05/2015  . Thrombocytopenia ([place]) 12/05/2015  . Alcohol withdrawal ([place]) 12/04/2015  . Generalized weakness 12/04/2015  . Abdominal pain 12/04/2015  . Nausea & vomiting 12/04/2015  . Arthritis of left hip 06/07/2013  . Status post THR (total hip replacement) 06/07/2013  . BPH (benign prostatic hypertrophy) with urinary obstruction 03/22/2013  . Protein-calorie malnutrition, severe ([place]) 11/30/2012  . Hip fracture ([place]) 11/23/2012  . HTN (hypertension) 11/23/2012  . Steatosis of liver 11/15/2012  . Anorexia nervosa 10/13/2012  . Loss of weight 07/16/2012  . ETOH abuse   . Weight loss 02/17/2012  . Dysphagia 12/30/2011  . Oral candida 12/30/2011  . Rectal bleed 12/30/2011  . Unintentional weight loss 12/30/2011  . Hx of adenomatous colonic polyps 12/30/2011  . Leucopenia 07/08/2007  . COPD (chronic obstructive pulmonary disease) ([place]) 12/21/2006  . LIVER FUNCTION TESTS, ABNORMAL 11/09/2006  . HYPERLIPIDEMIA 05/27/2006  . DISORDER, BIPOLAR NOS 05/27/2006   . TOBACCO ABUSE 05/27/2006  . Depression 05/27/2006  . CATARACT NOS 05/27/2006  . ALLERGIC RHINITIS 05/27/2006  . ASTHMA 05/27/2006  . GERD 05/27/2006  . ARTHRITIS 05/27/2006  . LOW BACK PAIN, CHRONIC 05/27/2006  . MALAISE AND FATIGUE 05/27/2006    Orientation RESPIRATION BLADDER Height & Weight     Self, Time, Situation, Place  Normal Incontinent, External catheter Weight: 69.2 kg Height:  5\' 8"  (172.7 cm)  BEHAVIORAL SYMPTOMS/MOOD NEUROLOGICAL BOWEL NUTRITION STATUS      Continent Diet(see discharge summary)  AMBULATORY STATUS COMMUNICATION OF NEEDS Skin   Extensive Assist Verbally Surgical wounds, Bruising, Skin abrasions(generalized echemosis, closed incision on right hip with adhesive bandage)                       Personal Care Assistance Level of Assistance  Bathing, Feeding, Dressing Bathing Assistance: Maximum assistance Feeding assistance: Independent Dressing Assistance: Maximum assistance     Functional Limitations Info  Sight, Hearing, Speech Sight Info: Adequate Hearing Info: Adequate Speech Info: Adequate    SPECIAL CARE FACTORS FREQUENCY  PT (By licensed PT), OT (By licensed OT)     PT Frequency: 5 times per week OT Frequency: 5 times per week            Contractures Contractures Info: Not present    Additional Factors Info  Code Status, Allergies Code Status Info: Full Allergies Info: NKDA           Current Medications (04/19/2019):  This is the current hospital active medication list Current Facility-Administered Medications  Medication Dose Route Frequency Provider Last Rate Last  Admin  . acetaminophen (TYLENOL) tablet 325-650 mg  325-650 mg Oral Q6H PRN Leandrew Koyanagi, MD   650 mg at 04/18/19 1702  . albuterol (PROVENTIL) (2.5 MG/3ML) 0.083% nebulizer solution 2.5 mg  2.5 mg Nebulization Q4H PRN Leandrew Koyanagi, MD      . alum & mag hydroxide-simeth (MAALOX/MYLANTA) 200-200-20 MG/5ML suspension 30 mL  30 mL Oral Q4H PRN Leandrew Koyanagi, MD      . ceFEPIme (MAXIPIME) 2 g in sodium chloride 0.9 % 100 mL IVPB  2 g Intravenous Q12H Einar Grad, RPH      . Chlorhexidine Gluconate Cloth 2 % PADS 6 each  6 each Topical Daily Lisette Abu, PA-C   6 each at 04/15/19 1014  . docusate sodium (COLACE) capsule 100 mg  100 mg Oral BID Leandrew Koyanagi, MD   100 mg at 04/19/19 1115  . folic acid (FOLVITE) tablet 1 mg  1 mg Oral Daily Leandrew Koyanagi, MD   1 mg at 04/19/19 1115  . HYDROcodone-acetaminophen (NORCO/VICODIN) 5-325 MG per tablet 1-2 tablet  1-2 tablet Oral Q4H PRN Leandrew Koyanagi, MD   2 tablet at 04/19/19 0104  . lactated ringers infusion   Intravenous Continuous Leandrew Koyanagi, MD   Stopped at 04/14/19 1456  . magnesium citrate solution 1 Bottle  1 Bottle Oral Once PRN Leandrew Koyanagi, MD      . menthol-cetylpyridinium (CEPACOL) lozenge 3 mg  1 lozenge Oral PRN Leandrew Koyanagi, MD       Or  . phenol (CHLORASEPTIC) mouth spray 1 spray  1 spray Mouth/Throat PRN Leandrew Koyanagi, MD      . methocarbamol (ROBAXIN) tablet 500 mg  500 mg Oral Q6H PRN Leandrew Koyanagi, MD   500 mg at 04/18/19 1702   Or  . methocarbamol (ROBAXIN) 500 mg in dextrose 5 % 50 mL IVPB  500 mg Intravenous Q6H PRN Leandrew Koyanagi, MD      . mometasone-formoterol [facility]) 200-5 MCG/ACT inhaler 2 puff  2 puff Inhalation BID Leandrew Koyanagi, MD   2 puff at 04/19/19 [phone]  . morphine 2 MG/ML injection 2 mg  2 mg Intravenous Q4H PRN Leandrew Koyanagi, MD   2 mg at 04/16/19 0234  . multivitamin with minerals tablet 1 tablet  1 tablet Oral Daily Leandrew Koyanagi, MD   1 tablet at 04/19/19 1115  . nicotine polacrilex (NICORETTE) gum 2 mg  2 mg Oral PRN Vertis Kelch, NP   2 mg at 04/15/19 0646  . ondansetron (ZOFRAN) tablet 4 mg  4 mg Oral Q6H PRN Leandrew Koyanagi, MD       Or  . ondansetron [facility]) injection 4 mg  4 mg Intravenous Q6H PRN Leandrew Koyanagi, MD      . oxyCODONE (Oxy IR/ROXICODONE) immediate release tablet 5 mg  5 mg Oral Q4H PRN Leandrew Koyanagi, MD   5 mg at  04/19/19 1126  . pantoprazole (PROTONIX) EC tablet 40 mg  40 mg Oral Daily Leandrew Koyanagi, MD   40 mg at 04/19/19 1115  . polyethylene glycol (MIRALAX / GLYCOLAX) packet 17 g  17 g Oral BID Alma Friendly, MD   17 g at 04/19/19 1115  . senna-docusate (Senokot-S) tablet 1 tablet  1 tablet Oral BID Leandrew Koyanagi, MD   1 tablet at 04/19/19 1114  . sorbitol 70 % solution 30 mL  30 mL Oral Daily  PRN Leandrew Koyanagi, MD      . thiamine tablet 100 mg  100 mg Oral Daily Leandrew Koyanagi, MD   100 mg at 04/19/19 1115   Or  . thiamine (B-1) injection 100 mg  100 mg Intravenous Daily Leandrew Koyanagi, MD      . vitamin B-12 (CYANOCOBALAMIN) tablet 1,000 mcg  1,000 mcg Oral Daily Alma Friendly, MD   1,000 mcg at 04/19/19 1115     Discharge Medications: Please see discharge summary for a list of discharge medications.  Relevant Imaging Results:  Relevant Lab Results:   Additional Information SS# [id number]  Curlene Labrum, RN

## 2019-04-19 NOTE — Social Work (Signed)
CSW acknowledging consult for [facility] PT/HH OT/DME/SNF/Medication Assistance/PCP. TOC team cannot assist with medication as pt has Medicaid coverage. Pt has been recommended for SNF placement, will assess for pt agreement. Pt with only Medicaid coverage, will be difficult placement.   Westley Hummer, MSW, [place] Work

## 2019-04-19 NOTE — Progress Notes (Signed)
Pharmacy Antibiotic Note  Duane Richardson is a 64 y.o. male admitted on 04/12/2019 with hip pain with fracture on imaging, now with fever and leukocytosis, concern for sepsis.  Pharmacy has been consulted for vancomycin and cefepime dosing.  Concern for UTI as source, Cr stable.  Plan: -Stop vancomycin -Reduce cefepime to 2g IV q12h with concern for urinary source   Height: 5\' 8"  (172.7 cm) Weight: 152 lb 8.9 oz (69.2 kg) IBW/kg (Calculated) : 68.4  Temp (24hrs), Avg:98.9 F (37.2 C), Min:98.4 F (36.9 C), Max:100.2 F (37.9 C)  Recent Labs  Lab 04/16/19 0342 04/16/19 0342 04/17/19 0343 04/17/19 1805 04/17/19 2055 04/18/19 0044 04/18/19 1416 04/19/19 0212  WBC 17.3*   < > 18.1* 16.5*  --  16.1* 16.3* 15.8*  CREATININE 0.62  --  0.72 0.69  --  0.66  --  0.57*  LATICACIDVEN  --   --   --  2.2* 1.6  --   --   --    < > = values in this interval not displayed.    Estimated Creatinine Clearance: 89.1 mL/min (A) (by C-G formula based on SCr of 0.57 mg/dL (L)).    No Known Allergies  Antimicrobials this admission: Ctx x 1 2/21 Cefepime 2/21>> Vanco 2/21>>2/23  Dose adjustments this admission: n/a  Microbiology results: 2/20 BCx: ngtd 2/20 UCx: GPC 2/16 UCx: 60K aerococcus  Arrie Senate, PharmD, BCPS Clinical Pharmacist [phone] Please check AMION for all [place] numbers 04/19/2019

## 2019-04-19 NOTE — Plan of Care (Signed)
  Problem: Clinical Measurements: Goal: Ability to maintain clinical measurements within normal limits will improve Outcome: Progressing Goal: Will remain free from infection Outcome: Progressing Goal: Diagnostic test results will improve Outcome: Progressing Goal: Respiratory complications will improve Outcome: Progressing Goal: Cardiovascular complication will be avoided Outcome: Progressing   Problem: Nutrition: Goal: Adequate nutrition will be maintained Outcome: Progressing   Problem: Coping: Goal: Level of anxiety will decrease Outcome: Progressing   Problem: Elimination: Goal: Will not experience complications related to urinary retention Outcome: Progressing   Problem: Pain Managment: Goal: General experience of comfort will improve Outcome: Progressing   Problem: Safety: Goal: Ability to remain free from injury will improve Outcome: Progressing   
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
## 2019-04-20 DIAGNOSIS — K219 Gastro-esophageal reflux disease without esophagitis: Secondary | ICD-10-CM | POA: Diagnosis not present

## 2019-04-20 DIAGNOSIS — S72141A Displaced intertrochanteric fracture of right femur, initial encounter for closed fracture: Secondary | ICD-10-CM | POA: Diagnosis not present

## 2019-04-20 DIAGNOSIS — J449 Chronic obstructive pulmonary disease, unspecified: Secondary | ICD-10-CM | POA: Diagnosis not present

## 2019-04-20 DIAGNOSIS — S728X1A Other fracture of right femur, initial encounter for closed fracture: Secondary | ICD-10-CM | POA: Diagnosis not present

## 2019-04-20 DIAGNOSIS — F1010 Alcohol abuse, uncomplicated: Secondary | ICD-10-CM | POA: Diagnosis not present

## 2019-04-20 DIAGNOSIS — M25551 Pain in right hip: Secondary | ICD-10-CM | POA: Diagnosis not present

## 2019-04-20 LAB — BASIC METABOLIC PANEL
Anion gap: 7 (ref 5–15)
BUN: 9 mg/dL (ref 8–23)
CO2: 19 mmol/L — ABNORMAL LOW (ref 22–32)
Calcium: 8.2 mg/dL — ABNORMAL LOW (ref 8.9–10.3)
Chloride: 106 mmol/L (ref 98–111)
Creatinine, Ser: 0.61 mg/dL (ref 0.61–1.24)
GFR calc Af Amer: >60 mL/min (ref >60)
GFR calc non Af Amer: >60 mL/min (ref >60)
Glucose, Bld: 137 mg/dL — ABNORMAL HIGH (ref 70–99)
Potassium: 3.8 mmol/L (ref 3.5–5.1)
Sodium: 132 mmol/L — ABNORMAL LOW (ref 135–145)

## 2019-04-20 LAB — TYPE AND SCREEN
ABO/RH(D): A POS
Antibody Screen: NEGATIVE
Unit division: 0
Unit division: 0

## 2019-04-20 LAB — CBC WITH DIFFERENTIAL/PLATELET
Abs Immature Granulocytes: 0.97 10*3/uL — ABNORMAL HIGH (ref 0.00–0.07)
Basophils Absolute: 0.2 10*3/uL — ABNORMAL HIGH (ref 0.0–0.1)
Basophils Relative: 1 %
Eosinophils Absolute: 0.2 10*3/uL (ref 0.0–0.5)
Eosinophils Relative: 1 %
HCT: 29.7 % — ABNORMAL LOW (ref 39.0–52.0)
Hemoglobin: 10.1 g/dL — ABNORMAL LOW (ref 13.0–17.0)
Immature Granulocytes: 5 %
Lymphocytes Relative: 6 %
Lymphs Abs: 1.2 10*3/uL (ref 0.7–4.0)
MCH: 30.7 pg (ref 26.0–34.0)
MCHC: 34 g/dL (ref 30.0–36.0)
MCV: 90.3 fL (ref 80.0–100.0)
Monocytes Absolute: 4.7 10*3/uL — ABNORMAL HIGH (ref 0.1–1.0)
Monocytes Relative: 23 %
Neutro Abs: 13.1 10*3/uL — ABNORMAL HIGH (ref 1.7–7.7)
Neutrophils Relative %: 64 %
Platelets: 283 10*3/uL (ref 150–400)
RBC: 3.29 MIL/uL — ABNORMAL LOW (ref 4.22–5.81)
RDW: 13.7 % (ref 11.5–15.5)
WBC: 20.3 10*3/uL — ABNORMAL HIGH (ref 4.0–10.5)
nRBC: 0.1 % (ref 0.0–0.2)

## 2019-04-20 LAB — BPAM RBC
Blood Product Expiration Date: 202102242359
Blood Product Expiration Date: 202102282359
ISSUE DATE / TIME: 202102231202
ISSUE DATE / TIME: 202102231543
Unit Type and Rh: 600
Unit Type and Rh: 600

## 2019-04-20 MED ORDER — AMOXICILLIN 500 MG PO CAPS
500.0000 mg | ORAL_CAPSULE | Freq: Three times a day (TID) | ORAL | Status: DC
  Administered 2019-04-20 – 2019-04-24 (×10): 500 mg via ORAL
  Filled 2019-04-20 (×14): qty 1
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
## 2019-04-20 NOTE — Social Work (Signed)
CSW attempted to see pt, therapies in working with pt at this time.  CSW will reattempt later to see pt to discuss SNF recommendations.   Westley Hummer, MSW, [place] Work

## 2019-04-20 NOTE — TOC Initial Note (Signed)
Transition of Care [facility]) - Initial/Assessment Note    Patient Details  Name: Duane Richardson MRN: [id number] Date of Birth: 1955-04-09  Transition of Care [facility]) CM/SW Contact:    Alexander Mt, LCSW Phone Number: 04/20/2019, 11:00 AM  Clinical Narrative:                 CSW spoke with pt at bedside. Introduced self, role, reason for visit. Pt from home alone, he says he has a ramp to get into his home and has both a walker and a cane. He confirmed home address and PCP Dr. Legrand Rams. Pt states that he is aware of recommendations for rehab. He has been to [facility] in the past. He would like to go to [facility], we discussed that Medicaid may be a barrier for some facilities. He states understanding, is receiving a disability check and understands that he will need to stay for at least 30 days for coverage. He would like to stay near home if possible.   TOC team will send referral for SNF. Barriers include iv pain mgmt, Medicaid only coverage.    Expected Discharge Plan: Skilled Nursing Facility Barriers to Discharge: Continued Medical Work up, SNF Pending bed offer   Patient Goals and CMS Choice Patient states their goals for this hospitalization and ongoing recovery are:: to go to rehab CMS Medicare.gov Compare Post Acute Care list provided to:: Patient Choice offered to / list presented to : Patient  Expected Discharge Plan and Services Expected Discharge Plan: [place] In-house Referral: Clinical Social Work Discharge Planning Services: CM Consult Post Acute Care Choice: [place], Durable Medical Equipment Living arrangements for the past 2 months: [place]  Prior Living Arrangements/Services Living arrangements for the past 2 months: [place] Lives with:: Self Patient language and need for interpreter reviewed:: Yes(no needs) Do you feel safe going back to the place where you live?: Yes      Need for Family  Participation in Patient Care: Yes (Comment)(assistance with daily cares) Care giver support system in place?: Yes (comment)(pt family/plan for SNF) Current home services: DME Criminal Activity/Legal Involvement Pertinent to Current Situation/Hospitalization: No - Comment as needed  Activities of Daily Living Home Assistive Devices/Equipment: [place] Rile (specify type) ADL Screening (condition at time of admission) Patient's cognitive ability adequate to safely complete daily activities?: Yes Is the patient deaf or have difficulty hearing?: No Does the patient have difficulty seeing, even when wearing glasses/contacts?: No Does the patient have difficulty concentrating, remembering, or making decisions?: No Patient able to express need for assistance with ADLs?: Yes Does the patient have difficulty dressing or bathing?: No Independently performs ADLs?: Yes (appropriate for developmental age) Does the patient have difficulty walking or climbing stairs?: Yes Weakness of Legs: Right Weakness of Arms/Hands: None  Permission Sought/Granted Permission sought to share information with : Facility Sport and exercise psychologist, Family Supports Permission granted to share information with : Yes, Verbal Permission Granted  Share Information with NAME: Elder Negus ([facility] social work)   Emotional Assessment Appearance:: Appears stated age Attitude/Demeanor/Rapport: Gracious, Engaged Affect (typically observed): Accepting, Adaptable, Appropriate Orientation: : Oriented to Situation, Oriented to  Time, Oriented to Place, Oriented to Self Alcohol / Substance Use: Alcohol Use Psych Involvement: No (comment)(not documented at this time)  Admission diagnosis:  Other fracture of right femur, initial encounter for closed fracture ([place]) [S72.8X1A] Closed fracture of right hip, initial encounter ([place]) [K48.185U] Acute alcoholic intoxication without complication ([place]) [D14.970] Patient Active Problem List  Diagnosis Date Noted  . Other fracture of right femur, initial encounter for closed fracture ([place]) 04/13/2019  . Atherosclerosis   . Acute colitis 12/05/2015  . Syncope 12/05/2015  . Hypokalemia 12/05/2015  . Hypomagnesemia 12/05/2015  . Thrombocytopenia ([place]) 12/05/2015  . Alcohol withdrawal ([place]) 12/04/2015  . Generalized weakness 12/04/2015  . Abdominal pain 12/04/2015  . Nausea & vomiting 12/04/2015  . Arthritis of left hip 06/07/2013  . Status post THR (total hip replacement) 06/07/2013  . BPH (benign prostatic hypertrophy) with urinary obstruction 03/22/2013  . Protein-calorie malnutrition, severe ([place]) 11/30/2012  . Hip fracture ([place]) 11/23/2012  . HTN (hypertension) 11/23/2012  . Steatosis of liver 11/15/2012  . Anorexia nervosa 10/13/2012  . Loss of weight 07/16/2012  . ETOH abuse   . Weight loss 02/17/2012  . Dysphagia 12/30/2011  . Oral candida 12/30/2011  . Rectal bleed 12/30/2011  . Unintentional weight loss 12/30/2011  . Hx of adenomatous colonic polyps 12/30/2011  . Leucopenia 07/08/2007  . COPD (chronic obstructive pulmonary disease) ([place]) 12/21/2006  . LIVER FUNCTION TESTS, ABNORMAL 11/09/2006  . HYPERLIPIDEMIA 05/27/2006  . DISORDER, BIPOLAR NOS 05/27/2006  . TOBACCO ABUSE 05/27/2006  . Depression 05/27/2006  . CATARACT NOS 05/27/2006  . ALLERGIC RHINITIS 05/27/2006  . ASTHMA 05/27/2006  . GERD 05/27/2006  . ARTHRITIS 05/27/2006  . LOW BACK PAIN, CHRONIC 05/27/2006  . MALAISE AND FATIGUE 05/27/2006   PCP:  [name], MD Pharmacy:   [facility] [address] [place] [address] Phone: [phone] Fax: [phone]  Readmission Risk Interventions Readmission Risk Prevention Plan 04/20/2019  Transportation Screening Complete  PCP or Specialist Appt within 5-7 Days Not Complete  Not Complete comments plan for SNF  Home Care Screening Complete  Medication Review (RN CM) Referral to Pharmacy  Some  recent data might be hidden
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
## 2019-04-20 NOTE — Plan of Care (Signed)
Pt alert and oriented able to express needs c/o pain right leg 9/10 without activity.PT/OT on board WBAT, On hip precautions. Plan to alternate oral and IV med to keep pain under control around the clock. Goal today 4 or less. Appetite not so good per patient. Ate about 50% of breakfast this AM. Condom cath for urination, last BM 1/22. Call bell within reach demonstrates proper usage will continue to monitor
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
## 2019-04-21 DIAGNOSIS — S72141A Displaced intertrochanteric fracture of right femur, initial encounter for closed fracture: Secondary | ICD-10-CM | POA: Diagnosis not present

## 2019-04-21 DIAGNOSIS — M25551 Pain in right hip: Secondary | ICD-10-CM | POA: Diagnosis not present

## 2019-04-21 DIAGNOSIS — J449 Chronic obstructive pulmonary disease, unspecified: Secondary | ICD-10-CM | POA: Diagnosis not present

## 2019-04-21 DIAGNOSIS — F1010 Alcohol abuse, uncomplicated: Secondary | ICD-10-CM | POA: Diagnosis not present

## 2019-04-21 DIAGNOSIS — K219 Gastro-esophageal reflux disease without esophagitis: Secondary | ICD-10-CM | POA: Diagnosis not present

## 2019-04-21 DIAGNOSIS — S728X1A Other fracture of right femur, initial encounter for closed fracture: Secondary | ICD-10-CM | POA: Diagnosis not present

## 2019-04-21 LAB — COMPREHENSIVE METABOLIC PANEL
ALT: 23 U/L (ref 0–44)
AST: 38 U/L (ref 15–41)
Albumin: 2.3 g/dL — ABNORMAL LOW (ref 3.5–5.0)
Alkaline Phosphatase: 85 U/L (ref 38–126)
Anion gap: 9 (ref 5–15)
BUN: 7 mg/dL — ABNORMAL LOW (ref 8–23)
CO2: 22 mmol/L (ref 22–32)
Calcium: 8.5 mg/dL — ABNORMAL LOW (ref 8.9–10.3)
Chloride: 101 mmol/L (ref 98–111)
Creatinine, Ser: 0.65 mg/dL (ref 0.61–1.24)
GFR calc Af Amer: >60 mL/min (ref >60)
GFR calc non Af Amer: >60 mL/min (ref >60)
Glucose, Bld: 104 mg/dL — ABNORMAL HIGH (ref 70–99)
Potassium: 3.9 mmol/L (ref 3.5–5.1)
Sodium: 132 mmol/L — ABNORMAL LOW (ref 135–145)
Total Bilirubin: 1.3 mg/dL — ABNORMAL HIGH (ref 0.3–1.2)
Total Protein: 6.2 g/dL — ABNORMAL LOW (ref 6.5–8.1)

## 2019-04-21 LAB — CBC WITH DIFFERENTIAL/PLATELET
Abs Immature Granulocytes: 0.6 10*3/uL — ABNORMAL HIGH (ref 0.00–0.07)
Abs Immature Granulocytes: 1.05 10*3/uL — ABNORMAL HIGH (ref 0.00–0.07)
Basophils Absolute: 0 10*3/uL (ref 0.0–0.1)
Basophils Absolute: 0.2 10*3/uL — ABNORMAL HIGH (ref 0.0–0.1)
Basophils Relative: 0 %
Basophils Relative: 1 %
Eosinophils Absolute: 0 10*3/uL (ref 0.0–0.5)
Eosinophils Absolute: 0.1 10*3/uL (ref 0.0–0.5)
Eosinophils Relative: 0 %
Eosinophils Relative: 0 %
HCT: 31.1 % — ABNORMAL LOW (ref 39.0–52.0)
HCT: 34.4 % — ABNORMAL LOW (ref 39.0–52.0)
Hemoglobin: 10.6 g/dL — ABNORMAL LOW (ref 13.0–17.0)
Hemoglobin: 11.5 g/dL — ABNORMAL LOW (ref 13.0–17.0)
Immature Granulocytes: 5 %
Lymphocytes Relative: 5 %
Lymphocytes Relative: 5 %
Lymphs Abs: 1.1 10*3/uL (ref 0.7–4.0)
Lymphs Abs: 1 10*3/uL (ref 0.7–4.0)
MCH: 31.4 pg (ref 26.0–34.0)
MCH: 31 pg (ref 26.0–34.0)
MCHC: 34.1 g/dL (ref 30.0–36.0)
MCHC: 33.4 g/dL (ref 30.0–36.0)
MCV: 92 fL (ref 80.0–100.0)
MCV: 92.7 fL (ref 80.0–100.0)
Monocytes Absolute: 4.1 10*3/uL — ABNORMAL HIGH (ref 0.1–1.0)
Monocytes Absolute: 3.8 10*3/uL — ABNORMAL HIGH (ref 0.1–1.0)
Monocytes Relative: 19 %
Monocytes Relative: 17 %
Myelocytes: 3 %
Neutro Abs: 15.8 10*3/uL — ABNORMAL HIGH (ref 1.7–7.7)
Neutro Abs: 15.7 10*3/uL — ABNORMAL HIGH (ref 1.7–7.7)
Neutrophils Relative %: 73 %
Neutrophils Relative %: 72 %
Platelets: 372 10*3/uL (ref 150–400)
Platelets: 442 10*3/uL — ABNORMAL HIGH (ref 150–400)
RBC: 3.38 MIL/uL — ABNORMAL LOW (ref 4.22–5.81)
RBC: 3.71 MIL/uL — ABNORMAL LOW (ref 4.22–5.81)
RDW: 13.8 % (ref 11.5–15.5)
RDW: 13.7 % (ref 11.5–15.5)
WBC: 21.6 10*3/uL — ABNORMAL HIGH (ref 4.0–10.5)
WBC: 21.7 10*3/uL — ABNORMAL HIGH (ref 4.0–10.5)
nRBC: 0 % (ref 0.0–0.2)
nRBC: 0 /100 WBC
nRBC: 0 % (ref 0.0–0.2)

## 2019-04-21 LAB — CULTURE, BLOOD (ROUTINE X 2)
Culture: NO GROWTH
Culture: NO GROWTH
Special Requests: ADEQUATE
Special Requests: ADEQUATE

## 2019-04-21 LAB — PHOSPHORUS: Phosphorus: 2.7 mg/dL (ref 2.5–4.6)

## 2019-04-21 LAB — MAGNESIUM: Magnesium: 2 mg/dL (ref 1.7–2.4)

## 2019-04-21 NOTE — Plan of Care (Signed)
Progressing with care plan.

## 2019-04-21 NOTE — TOC Progression Note (Addendum)
Transition of Care [facility]) - Progression Note    Patient Details  Name: Duane Richardson MRN: [id number] Date of Birth: 05/28/1955  [facility]) CM/SW [place], [place] Phone Number:  04/21/2019, 12:27 PM  Clinical Narrative:    4:25pm- CSW spoke with Elder Negus [phone]), payee at [employer]. Confirmed pt choice, she would like him to be near to [place]. Pt PASRR eval completed with Jacqualin Combes. TOC team to follow hopeful for dc tomorrow.    12:27pm- CSW spoke with pt at bedside. Provided CMS list of offers. Pt would like [facility].  PASRR pending level 2 evaluation, COVID swab requested to be ordered by Dr. Chana Bode. Continue to follow.    Expected Discharge Plan: [place] Barriers to Discharge: Continued Medical Work up, SNF Pending bed offer  Expected Discharge Plan and Services Expected Discharge Plan: [place] In-house Referral: Clinical Social Work Discharge Planning Services: CM Consult Post Acute Care Choice: [place], Durable Medical Equipment Living arrangements for the past 2 months: [place]   Readmission Risk Interventions Readmission Risk Prevention Plan 04/20/2019  Transportation Screening Complete  PCP or Specialist Appt within 5-7 Days Not Complete  Not Complete comments plan for SNF  Home Care Screening Complete  Medication Review (RN CM) Referral to Pharmacy  Some recent data might be hidden

## 2019-04-21 NOTE — Progress Notes (Signed)
RT in to administer scheduled Dulera. Pt appearing very uncomfortable in bed when told why RT was at bedside. Pt refusing inhaler at this time. Asked pt if he was okay, and if he needed anything. Pt states he is alright. RT will continue to monitor.
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
## 2019-04-22 ENCOUNTER — Inpatient Hospital Stay (HOSPITAL_COMMUNITY): Payer: Medicare Other

## 2019-04-22 DIAGNOSIS — J189 Pneumonia, unspecified organism: Secondary | ICD-10-CM

## 2019-04-22 DIAGNOSIS — M25551 Pain in right hip: Secondary | ICD-10-CM | POA: Diagnosis not present

## 2019-04-22 DIAGNOSIS — S728X1A Other fracture of right femur, initial encounter for closed fracture: Secondary | ICD-10-CM | POA: Diagnosis not present

## 2019-04-22 DIAGNOSIS — K219 Gastro-esophageal reflux disease without esophagitis: Secondary | ICD-10-CM | POA: Diagnosis not present

## 2019-04-22 DIAGNOSIS — S72141A Displaced intertrochanteric fracture of right femur, initial encounter for closed fracture: Secondary | ICD-10-CM | POA: Diagnosis not present

## 2019-04-22 DIAGNOSIS — F1010 Alcohol abuse, uncomplicated: Secondary | ICD-10-CM | POA: Diagnosis not present

## 2019-04-22 DIAGNOSIS — J449 Chronic obstructive pulmonary disease, unspecified: Secondary | ICD-10-CM | POA: Diagnosis not present

## 2019-04-22 LAB — MAGNESIUM: Magnesium: 2 mg/dL (ref 1.7–2.4)

## 2019-04-22 LAB — CBC WITH DIFFERENTIAL/PLATELET
Abs Immature Granulocytes: 0.77 10*3/uL — ABNORMAL HIGH (ref 0.00–0.07)
Basophils Absolute: 0.1 10*3/uL (ref 0.0–0.1)
Basophils Relative: 1 %
Eosinophils Absolute: 0.1 10*3/uL (ref 0.0–0.5)
Eosinophils Relative: 0 %
HCT: 32.8 % — ABNORMAL LOW (ref 39.0–52.0)
Hemoglobin: 11.2 g/dL — ABNORMAL LOW (ref 13.0–17.0)
Immature Granulocytes: 4 %
Lymphocytes Relative: 6 %
Lymphs Abs: 1.3 10*3/uL (ref 0.7–4.0)
MCH: 31.5 pg (ref 26.0–34.0)
MCHC: 34.1 g/dL (ref 30.0–36.0)
MCV: 92.1 fL (ref 80.0–100.0)
Monocytes Absolute: 4.1 10*3/uL — ABNORMAL HIGH (ref 0.1–1.0)
Monocytes Relative: 20 %
Neutro Abs: 14.3 10*3/uL — ABNORMAL HIGH (ref 1.7–7.7)
Neutrophils Relative %: 69 %
Platelets: 463 10*3/uL — ABNORMAL HIGH (ref 150–400)
RBC: 3.56 MIL/uL — ABNORMAL LOW (ref 4.22–5.81)
RDW: 13.5 % (ref 11.5–15.5)
WBC: 20.6 10*3/uL — ABNORMAL HIGH (ref 4.0–10.5)
nRBC: 0 % (ref 0.0–0.2)

## 2019-04-22 LAB — COMPREHENSIVE METABOLIC PANEL
ALT: 25 U/L (ref 0–44)
AST: 41 U/L (ref 15–41)
Albumin: 2.4 g/dL — ABNORMAL LOW (ref 3.5–5.0)
Alkaline Phosphatase: 91 U/L (ref 38–126)
Anion gap: 12 (ref 5–15)
BUN: 11 mg/dL (ref 8–23)
CO2: 18 mmol/L — ABNORMAL LOW (ref 22–32)
Calcium: 8.8 mg/dL — ABNORMAL LOW (ref 8.9–10.3)
Chloride: 100 mmol/L (ref 98–111)
Creatinine, Ser: 0.59 mg/dL — ABNORMAL LOW (ref 0.61–1.24)
GFR calc Af Amer: >60 mL/min (ref >60)
GFR calc non Af Amer: >60 mL/min (ref >60)
Glucose, Bld: 125 mg/dL — ABNORMAL HIGH (ref 70–99)
Potassium: 3.5 mmol/L (ref 3.5–5.1)
Sodium: 130 mmol/L — ABNORMAL LOW (ref 135–145)
Total Bilirubin: 1.2 mg/dL (ref 0.3–1.2)
Total Protein: 6.5 g/dL (ref 6.5–8.1)

## 2019-04-22 LAB — PHOSPHORUS: Phosphorus: 3.3 mg/dL (ref 2.5–4.6)

## 2019-04-22 MED ORDER — POTASSIUM CHLORIDE CRYS ER 20 MEQ PO TBCR
40.0000 meq | EXTENDED_RELEASE_TABLET | Freq: Two times a day (BID) | ORAL | Status: AC
  Administered 2019-04-22 (×2): 40 meq via ORAL
  Filled 2019-04-22 (×2): qty 2

## 2019-04-22 MED ORDER — IOHEXOL 300 MG/ML  SOLN
100.0000 mL | Freq: Once | INTRAMUSCULAR | Status: AC | PRN
  Administered 2019-04-22: 100 mL via INTRAVENOUS

## 2019-04-22 MED ORDER — GUAIFENESIN ER 600 MG PO TB12
1200.0000 mg | ORAL_TABLET | Freq: Two times a day (BID) | ORAL | Status: DC
  Administered 2019-04-22 – 2019-05-04 (×10): 1200 mg via ORAL
  Filled 2019-04-22 (×21): qty 2

## 2019-04-22 MED ORDER — SODIUM BICARBONATE-DEXTROSE 150-5 MEQ/L-% IV SOLN
150.0000 meq | INTRAVENOUS | Status: DC
  Administered 2019-04-22 – 2019-04-23 (×2): 150 meq via INTRAVENOUS
  Filled 2019-04-22 (×5): qty 1000

## 2019-04-22 NOTE — TOC Progression Note (Signed)
Transition of Care [facility]) - Progression Note    Patient Details  Name: Johnnathan Hagemeister Manka MRN: [id number] Date of Birth: 05-19-1955  Transition of Care [facility]) CM/SW [place],  Phone Number: 04/22/2019, 11:37 AM  Clinical Narrative:    Pt COVID test resulted negative, pt not medically ready per Dr. Alfredia Ferguson. Have updated [facility] they cannot do weekend admit due to staffing. They are able to take pt Monday, he will need new COVID screen swabbed Sunday if appropriate.    Expected Discharge Plan: [place] Barriers to Discharge: Continued Medical Work up, SNF Pending bed offer  Expected Discharge Plan and Services Expected Discharge Plan: [place] In-house Referral: Clinical Social Work Discharge Planning Services: CM Consult Post Acute Care Choice: [place], Durable Medical Equipment Living arrangements for the past 2 months: [place]  Readmission Risk Interventions Readmission Risk Prevention Plan 04/20/2019  Transportation Screening Complete  PCP or Specialist Appt within 5-7 Days Not Complete  Not Complete comments plan for SNF  Home Care Screening Complete  Medication Review (RN CM) Referral to Pharmacy  Some recent data might be hidden
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
## 2019-04-23 ENCOUNTER — Inpatient Hospital Stay (HOSPITAL_COMMUNITY): Payer: Medicare Other

## 2019-04-23 DIAGNOSIS — M25551 Pain in right hip: Secondary | ICD-10-CM | POA: Diagnosis not present

## 2019-04-23 DIAGNOSIS — D72829 Elevated white blood cell count, unspecified: Secondary | ICD-10-CM | POA: Diagnosis not present

## 2019-04-23 DIAGNOSIS — K5900 Constipation, unspecified: Secondary | ICD-10-CM | POA: Diagnosis not present

## 2019-04-23 DIAGNOSIS — F1020 Alcohol dependence, uncomplicated: Secondary | ICD-10-CM

## 2019-04-23 DIAGNOSIS — S72141A Displaced intertrochanteric fracture of right femur, initial encounter for closed fracture: Secondary | ICD-10-CM | POA: Diagnosis not present

## 2019-04-23 DIAGNOSIS — K219 Gastro-esophageal reflux disease without esophagitis: Secondary | ICD-10-CM | POA: Diagnosis not present

## 2019-04-23 DIAGNOSIS — S728X1A Other fracture of right femur, initial encounter for closed fracture: Secondary | ICD-10-CM | POA: Diagnosis not present

## 2019-04-23 DIAGNOSIS — F17210 Nicotine dependence, cigarettes, uncomplicated: Secondary | ICD-10-CM

## 2019-04-23 DIAGNOSIS — J449 Chronic obstructive pulmonary disease, unspecified: Secondary | ICD-10-CM | POA: Diagnosis not present

## 2019-04-23 DIAGNOSIS — R109 Unspecified abdominal pain: Secondary | ICD-10-CM

## 2019-04-23 DIAGNOSIS — F1010 Alcohol abuse, uncomplicated: Secondary | ICD-10-CM | POA: Diagnosis not present

## 2019-04-23 LAB — CBC WITH DIFFERENTIAL/PLATELET
Abs Immature Granulocytes: 0.57 10*3/uL — ABNORMAL HIGH (ref 0.00–0.07)
Basophils Absolute: 0.1 10*3/uL (ref 0.0–0.1)
Basophils Relative: 1 %
Eosinophils Absolute: 0.1 10*3/uL (ref 0.0–0.5)
Eosinophils Relative: 0 %
HCT: 33.3 % — ABNORMAL LOW (ref 39.0–52.0)
Hemoglobin: 11.3 g/dL — ABNORMAL LOW (ref 13.0–17.0)
Immature Granulocytes: 3 %
Lymphocytes Relative: 7 %
Lymphs Abs: 1.5 10*3/uL (ref 0.7–4.0)
MCH: 31.5 pg (ref 26.0–34.0)
MCHC: 33.9 g/dL (ref 30.0–36.0)
MCV: 92.8 fL (ref 80.0–100.0)
Monocytes Absolute: 4.6 10*3/uL — ABNORMAL HIGH (ref 0.1–1.0)
Monocytes Relative: 22 %
Neutro Abs: 13.8 10*3/uL — ABNORMAL HIGH (ref 1.7–7.7)
Neutrophils Relative %: 67 %
Platelets: 533 10*3/uL — ABNORMAL HIGH (ref 150–400)
RBC: 3.59 MIL/uL — ABNORMAL LOW (ref 4.22–5.81)
RDW: 13.5 % (ref 11.5–15.5)
WBC: 20.7 10*3/uL — ABNORMAL HIGH (ref 4.0–10.5)
nRBC: 0 % (ref 0.0–0.2)

## 2019-04-23 LAB — MAGNESIUM: Magnesium: 1.8 mg/dL (ref 1.7–2.4)

## 2019-04-23 LAB — COMPREHENSIVE METABOLIC PANEL
ALT: 26 U/L (ref 0–44)
AST: 40 U/L (ref 15–41)
Albumin: 2.5 g/dL — ABNORMAL LOW (ref 3.5–5.0)
Alkaline Phosphatase: 87 U/L (ref 38–126)
Anion gap: 9 (ref 5–15)
BUN: 10 mg/dL (ref 8–23)
CO2: 25 mmol/L (ref 22–32)
Calcium: 9 mg/dL (ref 8.9–10.3)
Chloride: 99 mmol/L (ref 98–111)
Creatinine, Ser: 0.65 mg/dL (ref 0.61–1.24)
GFR calc Af Amer: >60 mL/min (ref >60)
GFR calc non Af Amer: >60 mL/min (ref >60)
Glucose, Bld: 119 mg/dL — ABNORMAL HIGH (ref 70–99)
Potassium: 4.6 mmol/L (ref 3.5–5.1)
Sodium: 133 mmol/L — ABNORMAL LOW (ref 135–145)
Total Bilirubin: 1.1 mg/dL (ref 0.3–1.2)
Total Protein: 6.5 g/dL (ref 6.5–8.1)

## 2019-04-23 LAB — PHOSPHORUS: Phosphorus: 2.6 mg/dL (ref 2.5–4.6)

## 2019-04-23 MED ORDER — MAGNESIUM SULFATE 2 GM/50ML IV SOLN
2.0000 g | Freq: Once | INTRAVENOUS | Status: AC
  Administered 2019-04-23: 2 g via INTRAVENOUS
  Filled 2019-04-23: qty 50

## 2019-04-23 MED ORDER — LACTULOSE ENEMA
300.0000 mL | Freq: Once | ORAL | Status: AC
  Administered 2019-04-23: 300 mL via RECTAL
  Filled 2019-04-23 (×2): qty 300

## 2019-04-23 MED ORDER — SODIUM CHLORIDE 0.9 % IV SOLN: INTRAVENOUS | Status: DC
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
## 2019-04-24 ENCOUNTER — Inpatient Hospital Stay (HOSPITAL_COMMUNITY): Payer: Medicare Other

## 2019-04-24 DIAGNOSIS — K567 Ileus, unspecified: Secondary | ICD-10-CM

## 2019-04-24 DIAGNOSIS — M25551 Pain in right hip: Secondary | ICD-10-CM | POA: Diagnosis not present

## 2019-04-24 DIAGNOSIS — R140 Abdominal distension (gaseous): Secondary | ICD-10-CM

## 2019-04-24 DIAGNOSIS — S728X1A Other fracture of right femur, initial encounter for closed fracture: Secondary | ICD-10-CM | POA: Diagnosis not present

## 2019-04-24 DIAGNOSIS — S72141A Displaced intertrochanteric fracture of right femur, initial encounter for closed fracture: Secondary | ICD-10-CM | POA: Diagnosis not present

## 2019-04-24 DIAGNOSIS — K219 Gastro-esophageal reflux disease without esophagitis: Secondary | ICD-10-CM | POA: Diagnosis not present

## 2019-04-24 DIAGNOSIS — J449 Chronic obstructive pulmonary disease, unspecified: Secondary | ICD-10-CM | POA: Diagnosis not present

## 2019-04-24 DIAGNOSIS — F1010 Alcohol abuse, uncomplicated: Secondary | ICD-10-CM | POA: Diagnosis not present

## 2019-04-24 LAB — COMPREHENSIVE METABOLIC PANEL
ALT: 26 U/L (ref 0–44)
AST: 37 U/L (ref 15–41)
Albumin: 2.7 g/dL — ABNORMAL LOW (ref 3.5–5.0)
Alkaline Phosphatase: 91 U/L (ref 38–126)
Anion gap: 12 (ref 5–15)
BUN: 12 mg/dL (ref 8–23)
CO2: 24 mmol/L (ref 22–32)
Calcium: 9.1 mg/dL (ref 8.9–10.3)
Chloride: 97 mmol/L — ABNORMAL LOW (ref 98–111)
Creatinine, Ser: 0.7 mg/dL (ref 0.61–1.24)
GFR calc Af Amer: >60 mL/min (ref >60)
GFR calc non Af Amer: >60 mL/min (ref >60)
Glucose, Bld: 112 mg/dL — ABNORMAL HIGH (ref 70–99)
Potassium: 4.1 mmol/L (ref 3.5–5.1)
Sodium: 133 mmol/L — ABNORMAL LOW (ref 135–145)
Total Bilirubin: 1.4 mg/dL — ABNORMAL HIGH (ref 0.3–1.2)
Total Protein: 7.1 g/dL (ref 6.5–8.1)

## 2019-04-24 LAB — CBC WITH DIFFERENTIAL/PLATELET
Abs Immature Granulocytes: 0.66 10*3/uL — ABNORMAL HIGH (ref 0.00–0.07)
Basophils Absolute: 0.1 10*3/uL (ref 0.0–0.1)
Basophils Relative: 0 %
Eosinophils Absolute: 0 10*3/uL (ref 0.0–0.5)
Eosinophils Relative: 0 %
HCT: 36.9 % — ABNORMAL LOW (ref 39.0–52.0)
Hemoglobin: 12.1 g/dL — ABNORMAL LOW (ref 13.0–17.0)
Immature Granulocytes: 2 %
Lymphocytes Relative: 5 %
Lymphs Abs: 1.5 10*3/uL (ref 0.7–4.0)
MCH: 30.9 pg (ref 26.0–34.0)
MCHC: 32.8 g/dL (ref 30.0–36.0)
MCV: 94.4 fL (ref 80.0–100.0)
Monocytes Absolute: 4.7 10*3/uL — ABNORMAL HIGH (ref 0.1–1.0)
Monocytes Relative: 16 %
Neutro Abs: 21.4 10*3/uL — ABNORMAL HIGH (ref 1.7–7.7)
Neutrophils Relative %: 77 %
RBC: 3.91 MIL/uL — ABNORMAL LOW (ref 4.22–5.81)
RDW: 13.6 % (ref 11.5–15.5)
WBC: 28.4 10*3/uL — ABNORMAL HIGH (ref 4.0–10.5)
nRBC: 0 % (ref 0.0–0.2)

## 2019-04-24 LAB — PHOSPHORUS: Phosphorus: 3.5 mg/dL (ref 2.5–4.6)

## 2019-04-24 LAB — MAGNESIUM: Magnesium: 2.4 mg/dL (ref 1.7–2.4)

## 2019-04-24 MED ORDER — PANTOPRAZOLE SODIUM 40 MG IV SOLR
40.0000 mg | INTRAVENOUS | Status: DC
  Administered 2019-04-24 – 2019-05-05 (×12): 40 mg via INTRAVENOUS
  Filled 2019-04-24 (×11): qty 40
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
## 2019-04-24 NOTE — Progress Notes (Signed)
CT and xray reviewed and + ileus.  May be narcotic induced.  No other signs of infection and leukocytosis likely from ileus.   Continue with supportive care and I will defer management to primary team/surgery.  I will sign off, call with questions.  Thayer Headings, MD

## 2019-04-24 NOTE — Progress Notes (Signed)
Patient remains distended and constipated. Unable to pass stool. Kennon Holter, MD paged. Enema given per order with some results.

## 2019-04-25 ENCOUNTER — Inpatient Hospital Stay (HOSPITAL_COMMUNITY): Payer: Medicare Other

## 2019-04-25 ENCOUNTER — Encounter (HOSPITAL_COMMUNITY): Payer: Self-pay | Admitting: Internal Medicine

## 2019-04-25 DIAGNOSIS — F1010 Alcohol abuse, uncomplicated: Secondary | ICD-10-CM | POA: Diagnosis not present

## 2019-04-25 DIAGNOSIS — D509 Iron deficiency anemia, unspecified: Secondary | ICD-10-CM | POA: Diagnosis not present

## 2019-04-25 DIAGNOSIS — N390 Urinary tract infection, site not specified: Secondary | ICD-10-CM | POA: Diagnosis not present

## 2019-04-25 DIAGNOSIS — E162 Hypoglycemia, unspecified: Secondary | ICD-10-CM | POA: Diagnosis not present

## 2019-04-25 DIAGNOSIS — J449 Chronic obstructive pulmonary disease, unspecified: Secondary | ICD-10-CM | POA: Diagnosis not present

## 2019-04-25 DIAGNOSIS — Z20822 Contact with and (suspected) exposure to covid-19: Secondary | ICD-10-CM | POA: Diagnosis not present

## 2019-04-25 DIAGNOSIS — E43 Unspecified severe protein-calorie malnutrition: Secondary | ICD-10-CM | POA: Diagnosis not present

## 2019-04-25 DIAGNOSIS — K9189 Other postprocedural complications and disorders of digestive system: Secondary | ICD-10-CM | POA: Diagnosis not present

## 2019-04-25 DIAGNOSIS — D62 Acute posthemorrhagic anemia: Secondary | ICD-10-CM | POA: Diagnosis not present

## 2019-04-25 DIAGNOSIS — I10 Essential (primary) hypertension: Secondary | ICD-10-CM | POA: Diagnosis not present

## 2019-04-25 DIAGNOSIS — D473 Essential (hemorrhagic) thrombocythemia: Secondary | ICD-10-CM | POA: Diagnosis not present

## 2019-04-25 DIAGNOSIS — K219 Gastro-esophageal reflux disease without esophagitis: Secondary | ICD-10-CM | POA: Diagnosis not present

## 2019-04-25 DIAGNOSIS — E871 Hypo-osmolality and hyponatremia: Secondary | ICD-10-CM | POA: Diagnosis not present

## 2019-04-25 DIAGNOSIS — K760 Fatty (change of) liver, not elsewhere classified: Secondary | ICD-10-CM | POA: Diagnosis not present

## 2019-04-25 DIAGNOSIS — J9811 Atelectasis: Secondary | ICD-10-CM | POA: Diagnosis not present

## 2019-04-25 DIAGNOSIS — S72141A Displaced intertrochanteric fracture of right femur, initial encounter for closed fracture: Secondary | ICD-10-CM | POA: Diagnosis not present

## 2019-04-25 DIAGNOSIS — M1990 Unspecified osteoarthritis, unspecified site: Secondary | ICD-10-CM | POA: Diagnosis not present

## 2019-04-25 DIAGNOSIS — D519 Vitamin B12 deficiency anemia, unspecified: Secondary | ICD-10-CM | POA: Diagnosis not present

## 2019-04-25 DIAGNOSIS — A419 Sepsis, unspecified organism: Secondary | ICD-10-CM | POA: Diagnosis not present

## 2019-04-25 DIAGNOSIS — S728X1A Other fracture of right femur, initial encounter for closed fracture: Secondary | ICD-10-CM | POA: Diagnosis not present

## 2019-04-25 DIAGNOSIS — M25551 Pain in right hip: Secondary | ICD-10-CM | POA: Diagnosis not present

## 2019-04-25 DIAGNOSIS — E876 Hypokalemia: Secondary | ICD-10-CM | POA: Diagnosis not present

## 2019-04-25 DIAGNOSIS — Y908 Blood alcohol level of 240 mg/100 ml or more: Secondary | ICD-10-CM | POA: Diagnosis not present

## 2019-04-25 DIAGNOSIS — B9689 Other specified bacterial agents as the cause of diseases classified elsewhere: Secondary | ICD-10-CM | POA: Diagnosis not present

## 2019-04-25 DIAGNOSIS — K567 Ileus, unspecified: Secondary | ICD-10-CM | POA: Diagnosis not present

## 2019-04-25 DIAGNOSIS — E872 Acidosis: Secondary | ICD-10-CM | POA: Diagnosis not present

## 2019-04-25 DIAGNOSIS — F10239 Alcohol dependence with withdrawal, unspecified: Secondary | ICD-10-CM | POA: Diagnosis not present

## 2019-04-25 LAB — CBC WITH DIFFERENTIAL/PLATELET
Abs Immature Granulocytes: 0.59 10*3/uL — ABNORMAL HIGH (ref 0.00–0.07)
Basophils Absolute: 0.2 10*3/uL — ABNORMAL HIGH (ref 0.0–0.1)
Basophils Relative: 1 %
Eosinophils Absolute: 0.1 10*3/uL (ref 0.0–0.5)
Eosinophils Relative: 0 %
HCT: 38.7 % — ABNORMAL LOW (ref 39.0–52.0)
Hemoglobin: 12.7 g/dL — ABNORMAL LOW (ref 13.0–17.0)
Immature Granulocytes: 2 %
Lymphocytes Relative: 5 %
Lymphs Abs: 1.5 10*3/uL (ref 0.7–4.0)
MCH: 31.3 pg (ref 26.0–34.0)
MCHC: 32.8 g/dL (ref 30.0–36.0)
MCV: 95.3 fL (ref 80.0–100.0)
Monocytes Absolute: 4.4 10*3/uL — ABNORMAL HIGH (ref 0.1–1.0)
Monocytes Relative: 15 %
Neutro Abs: 22 10*3/uL — ABNORMAL HIGH (ref 1.7–7.7)
Neutrophils Relative %: 77 %
Platelets: 666 10*3/uL — ABNORMAL HIGH (ref 150–400)
RBC: 4.06 MIL/uL — ABNORMAL LOW (ref 4.22–5.81)
RDW: 13.5 % (ref 11.5–15.5)
WBC: 28.8 10*3/uL — ABNORMAL HIGH (ref 4.0–10.5)
nRBC: 0 % (ref 0.0–0.2)

## 2019-04-25 LAB — COMPREHENSIVE METABOLIC PANEL
ALT: 20 U/L (ref 0–44)
AST: 29 U/L (ref 15–41)
Albumin: 2.6 g/dL — ABNORMAL LOW (ref 3.5–5.0)
Alkaline Phosphatase: 93 U/L (ref 38–126)
Anion gap: 15 (ref 5–15)
BUN: 17 mg/dL (ref 8–23)
CO2: 22 mmol/L (ref 22–32)
Calcium: 8.9 mg/dL (ref 8.9–10.3)
Chloride: 99 mmol/L (ref 98–111)
Creatinine, Ser: 0.76 mg/dL (ref 0.61–1.24)
GFR calc Af Amer: >60 mL/min (ref >60)
GFR calc non Af Amer: >60 mL/min (ref >60)
Glucose, Bld: 76 mg/dL (ref 70–99)
Potassium: 4.1 mmol/L (ref 3.5–5.1)
Sodium: 136 mmol/L (ref 135–145)
Total Bilirubin: 1.4 mg/dL — ABNORMAL HIGH (ref 0.3–1.2)
Total Protein: 6.7 g/dL (ref 6.5–8.1)

## 2019-04-25 LAB — MAGNESIUM: Magnesium: 2.1 mg/dL (ref 1.7–2.4)

## 2019-04-25 LAB — PHOSPHORUS: Phosphorus: 4.6 mg/dL (ref 2.5–4.6)

## 2019-04-25 MED ORDER — WHITE PETROLATUM EX OINT
TOPICAL_OINTMENT | CUTANEOUS | Status: AC
  Filled 2019-04-25: qty 28.35

## 2019-04-25 MED ORDER — DIATRIZOATE MEGLUMINE & SODIUM 66-10 % PO SOLN
90.0000 mL | Freq: Once | ORAL | Status: AC
  Administered 2019-04-25: 90 mL via NASOGASTRIC
  Filled 2019-04-25: qty 90

## 2019-04-25 MED ORDER — POLYETHYLENE GLYCOL 3350 17 G PO PACK
17.0000 g | PACK | Freq: Two times a day (BID) | ORAL | Status: DC
  Administered 2019-05-01 – 2019-05-04 (×7): 17 g via ORAL
  Filled 2019-04-25 (×15): qty 1

## 2019-04-25 MED ORDER — ENOXAPARIN SODIUM 40 MG/0.4ML ~~LOC~~ SOLN
40.0000 mg | SUBCUTANEOUS | Status: DC
  Administered 2019-04-25 – 2019-05-05 (×11): 40 mg via SUBCUTANEOUS
  Filled 2019-04-25 (×11): qty 0.4

## 2019-04-25 MED ORDER — MORPHINE SULFATE (PF) 2 MG/ML IV SOLN
1.0000 mg | Freq: Three times a day (TID) | INTRAVENOUS | Status: DC | PRN
  Administered 2019-05-01: 1 mg via INTRAVENOUS
  Filled 2019-04-25: qty 1

## 2019-04-25 MED ORDER — BISACODYL 10 MG RE SUPP
10.0000 mg | Freq: Once | RECTAL | Status: AC
  Administered 2019-04-25: 10 mg via RECTAL
  Filled 2019-04-25: qty 1
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
## 2019-04-25 NOTE — Consult Note (Signed)
Reason for Consult: Ileus Referring Physician: Hospital team  Duane Richardson is an 64 y.o. male.  HPI: Patient with a recent hip fracture status post surgery who has had an ileus for about 4 days with increased abdominal girth and decreased bowel movements but he currently does not have any nausea or vomiting with his NG tube in although it was clamped all day due to his x-ray and he does not have abdominal pain and his white count is elevated and his x-rays were reviewed as well as previous colonoscopy from 2014 thirsty and wants to eat and at home might use some Pepto-Bismol periodically and only occasionally a suppository has no other complaints and his family history is negative from a GI standpoint  Past Medical History:  Diagnosis Date  . Allergy   . Anxiety   . Arthritis   . Asthma   . Atherosclerosis   . Bipolar 1 disorder ([place])   . Cataract   . COPD (chronic obstructive pulmonary disease) ([place])   . Depression   . Elevated PSA   . ETOH abuse    quit Oct 2013, then relapsed, as of Dec 2013 now has abstained X 1 month  . GERD (gastroesophageal reflux disease)   . Hyperlipidemia   . Hypertension   . Right hip pain 04/13/2019    Past Surgical History:  Procedure Laterality Date  . BACK SURGERY     lunbar  . COLONOSCOPY  12/21/09   MBW:GYKZ papilla otherwise normal/ pancolonic diverticula/mutiple colonic poylps  . COLONOSCOPY WITH ESOPHAGOGASTRODUODENOSCOPY (EGD) N/A 10/20/2012   Procedure: COLONOSCOPY WITH ESOPHAGOGASTRODUODENOSCOPY (EGD);  Surgeon: Daneil Dolin, MD;  [facility];  Service: Endoscopy;  Laterality: N/A;  10;15  . CYSTOSCOPY N/A 01/31/2013   Procedure: CYSTOSCOPY FLEXIBLE;  Surgeon: Marissa Nestle, MD;  Location: [facility];  Service: Urology;  Laterality: N/A;  I would like to do this around 1 pm on monday.   Marland Kitchen HARDWARE REMOVAL Left 06/07/2013   Procedure: HARDWARE REMOVAL;  Surgeon: Mcarthur Rossetti, MD;  Location: [facility];  Service: Orthopedics;   Laterality: Left;  . INTRAMEDULLARY (IM) NAIL INTERTROCHANTERIC Right 04/14/2019   Procedure: INTRAMEDULLARY (IM) NAIL INTERTROCHANTRIC;  Surgeon: Leandrew Koyanagi, MD;  Location: [facility];  Service: Orthopedics;  Laterality: Right;  . ORIF HIP FRACTURE Left 11/24/2012   Procedure: OPEN REDUCTION INTERNAL FIXATION HIP;  Surgeon: Sanjuana Kava, MD;  Location: [facility];  Service: Orthopedics;  Laterality: Left;  . SPINE SURGERY    . TOTAL HIP ARTHROPLASTY Left 06/07/2013   Procedure: REMOVE HARDWARE LEFT HIP AND LEFT TOTAL HIP ARTHROPLASTY ANTERIOR APPROACH;  Surgeon: Mcarthur Rossetti, MD;  Location: [facility];  Service: Orthopedics;  Laterality: Left;  . TRANSURETHRAL RESECTION OF PROSTATE N/A 03/22/2013   Procedure: TRANSURETHRAL RESECTION OF THE PROSTATE (TURP);  Surgeon: Marissa Nestle, MD;  Location: [facility];  Service: Urology;  Laterality: N/A;    Family History  Problem Relation Age of Onset  . Mental illness Sister   . Colon cancer Neg Hx     Social History:  reports that he has been smoking cigarettes. He has a 15.00 pack-year smoking history. He has never used smokeless tobacco. He reports previous drug use. Drugs: Marijuana and Cocaine. He reports that he does not drink alcohol.  Allergies: No Known Allergies  Medications: I have reviewed the patient's current medications.  Results for orders placed or performed during the hospital encounter of 04/12/19 (from the past 48 hour(s))  CBC with  Differential/Platelet     Status: Abnormal   Collection Time: 04/24/19  4:11 AM  Result Value Ref Range   WBC 28.4 (H) 4.0 - 10.5 K/uL    Comment: REPEATED TO VERIFY WHITE COUNT CONFIRMED ON SMEAR    RBC 3.91 (L) 4.22 - 5.81 MIL/uL   Hemoglobin 12.1 (L) 13.0 - 17.0 g/dL   HCT 36.9 (L) 39.0 - 52.0 %   MCV 94.4 80.0 - 100.0 fL   MCH 30.9 26.0 - 34.0 pg   MCHC 32.8 30.0 - 36.0 g/dL   RDW 13.6 11.5 - 15.5 %   Platelets  150 - 400 K/uL    PLATELET CLUMPS NOTED ON SMEAR, COUNT APPEARS INCREASED     Comment: REPEATED TO VERIFY SPECIMEN CHECKED FOR CLOTS    nRBC 0.0 0.0 - 0.2 %   Neutrophils Relative % 77 %   Neutro Abs 21.4 (H) 1.7 - 7.7 K/uL   Lymphocytes Relative 5 %   Lymphs Abs 1.5 0.7 - 4.0 K/uL   Monocytes Relative 16 %   Monocytes Absolute 4.7 (H) 0.1 - 1.0 K/uL   Eosinophils Relative 0 %   Eosinophils Absolute 0.0 0.0 - 0.5 K/uL   Basophils Relative 0 %   Basophils Absolute 0.1 0.0 - 0.1 K/uL   Immature Granulocytes 2 %   Abs Immature Granulocytes 0.66 (H) 0.00 - 0.07 K/uL    Comment: Performed at [facility] [address], [place], [place] [address]  Comprehensive metabolic panel     Status: Abnormal   Collection Time: 04/24/19  4:11 AM  Result Value Ref Range   Sodium 133 (L) 135 - 145 mmol/L   Potassium 4.1 3.5 - 5.1 mmol/L   Chloride 97 (L) 98 - 111 mmol/L   CO2 24 22 - 32 mmol/L   Glucose, Bld 112 (H) 70 - 99 mg/dL    Comment: Glucose reference range applies only to samples taken after fasting for at least 8 hours.   BUN 12 8 - 23 mg/dL   Creatinine, Ser 0.70 0.61 - 1.24 mg/dL   Calcium 9.1 8.9 - 10.3 mg/dL   Total Protein 7.1 6.5 - 8.1 g/dL   Albumin 2.7 (L) 3.5 - 5.0 g/dL   AST 37 15 - 41 U/L   ALT 26 0 - 44 U/L   Alkaline Phosphatase 91 38 - 126 U/L   Total Bilirubin 1.4 (H) 0.3 - 1.2 mg/dL   GFR calc non Af Amer >60 >60 mL/min   GFR calc Af Amer >60 >60 mL/min   Anion gap 12 5 - 15    Comment: Performed at [facility] [address] [place], [place] [address]  Magnesium     Status: None   Collection Time: 04/24/19  4:11 AM  Result Value Ref Range   Magnesium 2.4 1.7 - 2.4 mg/dL    Comment: Performed at [facility] [address], [place], [place] [address]  Phosphorus     Status: None   Collection Time: 04/24/19  4:11 AM  Result Value Ref Range   Phosphorus 3.5 2.5 - 4.6 mg/dL    Comment: Performed at [facility] [address] [place], [place] [address]  CBC with Differential/Platelet     Status: Abnormal    Collection Time: 04/25/19  3:03 AM  Result Value Ref Range   WBC 28.8 (H) 4.0 - 10.5 K/uL   RBC 4.06 (L) 4.22 - 5.81 MIL/uL   Hemoglobin 12.7 (L)  13.0 - 17.0 g/dL   HCT 38.7 (L) 39.0 - 52.0 %   MCV 95.3 80.0 - 100.0 fL   MCH 31.3 26.0 - 34.0 pg   MCHC 32.8 30.0 - 36.0 g/dL   RDW 13.5 11.5 - 15.5 %   Platelets 666 (H) 150 - 400 K/uL   nRBC 0.0 0.0 - 0.2 %   Neutrophils Relative % 77 %   Neutro Abs 22.0 (H) 1.7 - 7.7 K/uL   Lymphocytes Relative 5 %   Lymphs Abs 1.5 0.7 - 4.0 K/uL   Monocytes Relative 15 %   Monocytes Absolute 4.4 (H) 0.1 - 1.0 K/uL   Eosinophils Relative 0 %   Eosinophils Absolute 0.1 0.0 - 0.5 K/uL   Basophils Relative 1 %   Basophils Absolute 0.2 (H) 0.0 - 0.1 K/uL   Immature Granulocytes 2 %   Abs Immature Granulocytes 0.59 (H) 0.00 - 0.07 K/uL   Polychromasia PRESENT     Comment: Performed at [facility], [address], [place], [place] [address]  Comprehensive metabolic panel     Status: Abnormal   Collection Time: 04/25/19  3:03 AM  Result Value Ref Range   Sodium 136 135 - 145 mmol/L   Potassium 4.1 3.5 - 5.1 mmol/L   Chloride 99 98 - 111 mmol/L   CO2 22 22 - 32 mmol/L   Glucose, Bld 76 70 - 99 mg/dL    Comment: Glucose reference range applies only to samples taken after fasting for at least 8 hours.   BUN 17 8 - 23 mg/dL   Creatinine, Ser 0.76 0.61 - 1.24 mg/dL   Calcium 8.9 8.9 - 10.3 mg/dL   Total Protein 6.7 6.5 - 8.1 g/dL   Albumin 2.6 (L) 3.5 - 5.0 g/dL   AST 29 15 - 41 U/L   ALT 20 0 - 44 U/L   Alkaline Phosphatase 93 38 - 126 U/L   Total Bilirubin 1.4 (H) 0.3 - 1.2 mg/dL   GFR calc non Af Amer >60 >60 mL/min   GFR calc Af Amer >60 >60 mL/min   Anion gap 15 5 - 15    Comment: Performed at [facility] [address], [place], [place] [address]  Magnesium     Status: None   Collection Time: 04/25/19  3:03 AM  Result Value Ref Range   Magnesium 2.1 1.7 - 2.4 mg/dL    Comment: Performed at [facility]  [address] [place], [place] [address]  Phosphorus     Status: None   Collection Time: 04/25/19  3:03 AM  Result Value Ref Range   Phosphorus 4.6 2.5 - 4.6 mg/dL    Comment: Performed at [facility] [address] [place], [place] [address]    CT ABDOMEN PELVIS WO CONTRAST  Result Date: 04/23/2019 CLINICAL DATA:  Abdominal distension. EXAM: CT ABDOMEN AND PELVIS WITHOUT CONTRAST TECHNIQUE: Multidetector CT imaging of the abdomen and pelvis was performed following the standard protocol without IV contrast. COMPARISON:  04/17/2019 FINDINGS: Lower chest: Small bilateral pleural effusions are noted with adjacent atelectasis, right worse than left.The heart size is normal. Hepatobiliary: The liver is normal. Normal gallbladder.There is no biliary ductal dilation. Pancreas: Normal contours without ductal dilatation. No peripancreatic fluid collection. Spleen: No splenic laceration or hematoma. Adrenals/Urinary Tract: --Adrenal glands: No adrenal hemorrhage. --Right kidney/ureter: No hydronephrosis or perinephric hematoma. --Left kidney/ureter: Again noted is a minimally obstructing 8 mm stone in the distal left  ureter (axial series 3, image 71). --Urinary bladder: The bladder is somewhat decompressed which limits evaluation. Stomach/Bowel: --Stomach/Duodenum: The stomach is distended. There is a moderate-sized hiatal hernia. The esophagus appears to be fluid-filled. --Small bowel: There are dilated loops of small bowel scattered throughout the abdomen measuring up to approximately 3.4 cm in diameter. Air-fluid levels are noted. There is no distinct transition point. --Colon: The colon is distended with air. There is a large amount of stool in the ascending colon. --Appendix: Normal. Vascular/Lymphatic: Atherosclerotic calcification is present within the non-aneurysmal abdominal aorta, without hemodynamically significant stenosis. --No retroperitoneal lymphadenopathy. --No mesenteric lymphadenopathy. --No pelvic  or inguinal lymphadenopathy. Reproductive: The prostate gland is enlarged. Other: There is a trace amount of fluid in the patient's pelvis, likely reactive the abdominal wall is normal. Musculoskeletal. There is mild anterior wedging of the L1 vertebral body, stable from prior study. The patient is status post prior total hip arthroplasty on the left and intramedullary nail placement through the proximal right femur for a known fracture. There is a step-off involving the right anterior column of the acetabulum, stable across prior studies. There are postsurgical changes about the right femur. IMPRESSION: 1. Gas distended colon without evidence for obstruction. 2. Mildly dilated loops of small bowel scattered throughout the abdomen with associated air-fluid levels. There is no distinct transition point. Findings are favored to represent in ileus or low-grade small bowel obstruction. 3. Normal appendix in the right lower quadrant. 4. Bibasilar atelectasis with trace bilateral pleural effusions. 5. Stable minimally obstructing stone in the distal left ureter. Aortic Atherosclerosis (ICD10-I70.0). Electronically Signed   By: Constance Holster M.D.   On: 04/23/2019 19:59   DG Abd 1 View  Result Date: 04/25/2019 CLINICAL DATA:  Ileus, postoperative. EXAM: ABDOMEN - 1 VIEW COMPARISON:  One-view abdomen 04/24/2019. One-view abdomen 04/23/2019. FINDINGS: Dilated loops of large and small bowel are again seen. Gas can be seen to the rectum. Small bowel dilation has increased slightly. No free air is present. NG tube is in place. Left total hip arthroplasty is noted. Right hip ORIF is noted. IMPRESSION: 1. Increasing dilation of large and small bowel compatible with ileus. Electronically Signed   By: San Morelle M.D.   On: 04/25/2019 06:47   DG Abd 1 View  Result Date: 04/24/2019 CLINICAL DATA:  Nasogastric tube placement EXAM: ABDOMEN - 1 VIEW COMPARISON:  CT abdomen and pelvis of 04/23/2019 and multiple prior  radiographs. FINDINGS: Insertion of nasogastric tube, within the stomach which is likely push cephalad by the distended colonic loops. Distended small bowel loops as before. Side-port for gastric tube is below the EG junction. Signs of small right effusion and basilar airspace disease. Visualized skeletal structures are unremarkable. IMPRESSION: Nasogastric tube in place, side port below the GE junction. Persistent small and large bowel distension perhaps slightly improved as compared to the radiograph of 04/23/2019. Pelvic portion of the bowel is not imaged on current study. Electronically Signed   By: Zetta Bills M.D.   On: 04/24/2019 13:30   DG Abd 1 View  Result Date: 04/23/2019 CLINICAL DATA:  Abdominal distension EXAM: ABDOMEN - 1 VIEW COMPARISON:  04/17/2019 FINDINGS: Two supine frontal views of the abdomen and pelvis demonstrate diffuse gaseous distention of the large and small bowel, most pronounced throughout the transverse colon. No masses or abnormal calcifications. No free gas identified on this supine evaluation. Postsurgical changes are seen at the bilateral hips. IMPRESSION: 1. Diffuse gaseous distension of the large and small bowel, most consistent  with ileus. Electronically Signed   By: Randa Ngo M.D.   On: 04/23/2019 19:38    Review of Systems negative except above Blood pressure (!) 130/94, pulse (!) 103, temperature 98.7 F (37.1 C), temperature source Oral, resp. rate 18, height 5\' 8"  (1.727 m), weight 72.8 kg, SpO2 98 %. Physical Exam them pertinent for his abdomen being slight distended occasional bowel sounds soft no guarding or rebound x-rays and labs reviewed  Assessment/Plan: Postop ileus Plan: We will restart MiraLAX and clamp NG for an hour twice a day and will decrease narcotics and will consider erythromycin tomorrow and if not much NG output today consider even a trial of sips of clear liquids tomorrow and await repeat labs and x-ray tomorrow  [facility]  E 04/25/2019, 2:40 PM

## 2019-04-25 NOTE — TOC Progression Note (Signed)
Transition of Care [facility]) - Progression Note    Patient Details  Name: Susana Gripp Friede MRN: [id number] Date of Birth: 1956/01/13  Transition of Care [facility]) CM/SW [place], [place] Phone Number: 04/25/2019, 12:23 PM  Clinical Narrative:    Pt plan is for SNF placement at [facility]. Not medically ready, have reached out to SNF to update them regarding pt delay of discharge and ongoing medical management. They are aware and following. Pt PASRR was received.   Expected Discharge Plan: [place] Barriers to Discharge: Continued Medical Work up, SNF Pending bed offer  Expected Discharge Plan and Services Expected Discharge Plan: [place] In-house Referral: Clinical Social Work Discharge Planning Services: CM Consult Post Acute Care Choice: [place], Durable Medical Equipment Living arrangements for the past 2 months: [place]  Readmission Risk Interventions Readmission Risk Prevention Plan 04/20/2019  Transportation Screening Complete  PCP or Specialist Appt within 5-7 Days Not Complete  Not Complete comments plan for SNF  Home Care Screening Complete  Medication Review (RN CM) Referral to Pharmacy  Some recent data might be hidden

## 2019-04-25 NOTE — Progress Notes (Addendum)
Rehab Admissions Coordinator Note:  Per PT recommendation, patient was screened by Michel Santee for appropriateness for an Inpatient Acute Rehab Consult.  At this time, pt does not appear to have a qualifying diagnosis for CIR.  Would recommend f/u with lower level of care. Note pt accepted bed offer at SNF.  Michel Santee 04/25/2019, 4:12 PM  I can be reached at [phone].
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
## 2019-04-26 ENCOUNTER — Inpatient Hospital Stay (HOSPITAL_COMMUNITY): Payer: Medicare Other

## 2019-04-26 DIAGNOSIS — S72141A Displaced intertrochanteric fracture of right femur, initial encounter for closed fracture: Secondary | ICD-10-CM | POA: Diagnosis not present

## 2019-04-26 DIAGNOSIS — S728X1A Other fracture of right femur, initial encounter for closed fracture: Secondary | ICD-10-CM | POA: Diagnosis not present

## 2019-04-26 DIAGNOSIS — F1010 Alcohol abuse, uncomplicated: Secondary | ICD-10-CM | POA: Diagnosis not present

## 2019-04-26 DIAGNOSIS — J449 Chronic obstructive pulmonary disease, unspecified: Secondary | ICD-10-CM | POA: Diagnosis not present

## 2019-04-26 DIAGNOSIS — M25551 Pain in right hip: Secondary | ICD-10-CM | POA: Diagnosis not present

## 2019-04-26 DIAGNOSIS — K219 Gastro-esophageal reflux disease without esophagitis: Secondary | ICD-10-CM | POA: Diagnosis not present

## 2019-04-26 LAB — CBC WITH DIFFERENTIAL/PLATELET
Abs Immature Granulocytes: 0.65 10*3/uL — ABNORMAL HIGH (ref 0.00–0.07)
Basophils Absolute: 0.2 10*3/uL — ABNORMAL HIGH (ref 0.0–0.1)
Basophils Relative: 1 %
Eosinophils Absolute: 0.1 10*3/uL (ref 0.0–0.5)
Eosinophils Relative: 0 %
HCT: 36.4 % — ABNORMAL LOW (ref 39.0–52.0)
Hemoglobin: 12 g/dL — ABNORMAL LOW (ref 13.0–17.0)
Immature Granulocytes: 3 %
Lymphocytes Relative: 7 %
Lymphs Abs: 1.7 10*3/uL (ref 0.7–4.0)
MCH: 31.3 pg (ref 26.0–34.0)
MCHC: 33 g/dL (ref 30.0–36.0)
MCV: 94.8 fL (ref 80.0–100.0)
Monocytes Absolute: 3.7 10*3/uL — ABNORMAL HIGH (ref 0.1–1.0)
Monocytes Relative: 14 %
Neutro Abs: 20 10*3/uL — ABNORMAL HIGH (ref 1.7–7.7)
Neutrophils Relative %: 75 %
Platelets: 645 10*3/uL — ABNORMAL HIGH (ref 150–400)
RBC: 3.84 MIL/uL — ABNORMAL LOW (ref 4.22–5.81)
RDW: 13.5 % (ref 11.5–15.5)
WBC: 26.3 10*3/uL — ABNORMAL HIGH (ref 4.0–10.5)
nRBC: 0 % (ref 0.0–0.2)

## 2019-04-26 LAB — COMPREHENSIVE METABOLIC PANEL
ALT: 19 U/L (ref 0–44)
AST: 34 U/L (ref 15–41)
Albumin: 2.7 g/dL — ABNORMAL LOW (ref 3.5–5.0)
Alkaline Phosphatase: 85 U/L (ref 38–126)
Anion gap: 16 — ABNORMAL HIGH (ref 5–15)
BUN: 20 mg/dL (ref 8–23)
CO2: 19 mmol/L — ABNORMAL LOW (ref 22–32)
Calcium: 8.9 mg/dL (ref 8.9–10.3)
Chloride: 103 mmol/L (ref 98–111)
Creatinine, Ser: 0.84 mg/dL (ref 0.61–1.24)
GFR calc Af Amer: >60 mL/min (ref >60)
GFR calc non Af Amer: >60 mL/min (ref >60)
Glucose, Bld: 63 mg/dL — ABNORMAL LOW (ref 70–99)
Potassium: 4.2 mmol/L (ref 3.5–5.1)
Sodium: 138 mmol/L (ref 135–145)
Total Bilirubin: 1.5 mg/dL — ABNORMAL HIGH (ref 0.3–1.2)
Total Protein: 6.7 g/dL (ref 6.5–8.1)

## 2019-04-26 LAB — MAGNESIUM: Magnesium: 2.1 mg/dL (ref 1.7–2.4)

## 2019-04-26 LAB — PHOSPHORUS: Phosphorus: 4.1 mg/dL (ref 2.5–4.6)

## 2019-04-26 LAB — PATHOLOGIST SMEAR REVIEW

## 2019-04-26 MED ORDER — KCL IN DEXTROSE-NACL 20-5-0.45 MEQ/L-%-% IV SOLN
INTRAVENOUS | Status: AC
  Filled 2019-04-26 (×11): qty 1000

## 2019-04-26 NOTE — Final Consult Note (Signed)
Consultant Final Sign-Off Note    Assessment/Final recommendations  Duane Richardson is a 64 y.o. male followed by me for ileus vs PSBO. Feel this is an ileus and not an obstruction. GI was consulted yesterday as well and is following. Defer further management to GI as ileus is not a surgical problem.    Wound care (if applicable): N/A   Diet at discharge: per GI   Activity at discharge: per primary team   Follow-up appointment:  N/A   Pending results:  Unresulted Labs (From admission, onward)    Start     Ordered   04/26/19 0500  CBC with Differential/Platelet  Tomorrow morning,   R    Question:  Specimen collection method  Answer:  Lab=Lab collect   04/25/19 1349   04/25/19 0500  CBC with Differential/Platelet  Tomorrow morning,   R    Question:  Specimen collection method  Answer:  Lab=Lab collect   04/24/19 1402   04/24/19 0500  CBC with Differential/Platelet  Tomorrow morning,   R    Question:  Specimen collection method  Answer:  Lab=Lab collect   04/23/19 1834   04/23/19 0500  CBC with Differential/Platelet  Tomorrow morning,   R    Question:  Specimen collection method  Answer:  Lab=Lab collect   04/22/19 1948   04/22/19 0500  CBC with Differential/Platelet  Tomorrow morning,   R    Question:  Specimen collection method  Answer:  Lab=Lab collect   04/21/19 1720   04/21/19 1332  SARS CORONAVIRUS 2 (TAT 6-24 HRS) Nasopharyngeal Nasopharyngeal Swab  (Tier 3 (TAT 6-24 hrs))  Once,   R    Question Answer Comment  Is this test for diagnosis or screening Screening   Symptomatic for COVID-19 as defined by CDC No   Hospitalized for COVID-19 No   Admitted to ICU for COVID-19 No   Previously tested for COVID-19 Yes   Resident in a congregate (group) care setting No   Employed in healthcare setting No      04/21/19 1331   04/21/19 0500  CBC with Differential/Platelet  Tomorrow morning,   R    Question:  Specimen collection method  Answer:  Lab=Lab collect   04/20/19 1757    04/14/19 0500  CBC with Differential/Platelet  Daily,   R     04/13/19 1254           Medication recommendations: suppositories and motility agents per GI   Other recommendations: Continue to mobilize as able    Thank you for allowing Duane Richardson to participate in the care of your patient!  Please consult Duane Richardson again if you have further needs for your patient.  Duane Richardson 04/26/2019 8:03 AM    Subjective   Patient insistent that is not able to have any bowel function without having had anything to eat/drink. I tried to explain the pathophysiology of an ileus and also that you do not have to be eating/drinking to have some bowel function but patient resistant to hearing this. He does report he got up to the chair yesterday and was able to mobilize with therapies. He thinks he is passing some flatus, no BM recorded in I/O flowsheet. He feels like abdominal distention is a little better and denies abdominal pain.   Objective  Vital signs in last 24 hours: Temp:  [97.8 F (36.6 C)-98.7 F (37.1 C)] 97.8 F (36.6 C) (03/02 0329) Pulse Rate:  [98-108] 101 (03/02 0329) Resp:  [17-18] 18 (03/02 0329)  BP: (128-142)/(90-95) 142/90 (03/02 0329) SpO2:  [90 %-98 %] 94 % (03/02 0716)  General: pleasant, WD, thin AA male who is laying in bed in NAD HEENT: Sclera are noninjected.  PERRL.  Ears and nose without any masses or lesions.  Mouth is pink and dry. NGT present with bilious drainage Heart: regular, rate, and rhythm.  Normal s1,s2. No obvious murmurs, gallops, or rubs noted.  Palpable radial and pedal pulses bilaterally Lungs: CTAB, no wheezes, rhonchi, or rales noted.  Respiratory effort nonlabored Abd: soft, NT, distended, +BS, no masses, hernias, or organomegaly MS: all 4 extremities are symmetrical with no cyanosis, clubbing, or edema. Skin: warm and dry with no masses, lesions, or rashes Neuro: Cranial nerves 2-12 grossly intact, sensation is normal throughout Psych: A&Ox3, mood is  agitated    Pertinent labs and Studies: Recent Labs    04/24/19 0411 04/25/19 0303 04/26/19 0310  WBC 28.4* 28.8* 26.3*  HGB 12.1* 12.7* 12.0*  HCT 36.9* 38.7* 36.4*   BMET Recent Labs    04/25/19 0303 04/26/19 0310  NA 136 138  K 4.1 4.2  CL 99 103  CO2 22 19*  GLUCOSE 76 63*  BUN 17 20  CREATININE 0.76 0.84  CALCIUM 8.9 8.9   No results for input(s): LABURIN in the last 72 hours. Results for orders placed or performed during the hospital encounter of 04/12/19  Urine culture     Status: Abnormal   Collection Time: 04/12/19 10:28 PM   Specimen: Urine, Random  Result Value Ref Range Status   Specimen Description   Final    URINE, RANDOM Performed at [facility], [address], [place], [place] [address]    Special Requests   Final    NONE Performed at [facility], [address] [place], [place] [address]    Culture (A)  Final    60,000 COLONIES/mL AEROCOCCUS URINAE Standardized susceptibility testing for this organism is not available. Performed at [facility], [place] [address] [place], [place] [address]    Report Status 04/16/2019 FINAL  Final  SARS CORONAVIRUS 2 (TAT 6-24 HRS) Nasopharyngeal Nasopharyngeal Swab     Status: None   Collection Time: 04/13/19  1:02 AM   Specimen: Nasopharyngeal Swab  Result Value Ref Range Status   SARS Coronavirus 2 NEGATIVE NEGATIVE Final    Comment: (NOTE) SARS-CoV-2 target nucleic acids are NOT DETECTED. The SARS-CoV-2 RNA is generally detectable in upper and lower respiratory specimens during the acute phase of infection. Negative results do not preclude SARS-CoV-2 infection, do not rule out co-infections with other pathogens, and should not be used as the sole basis for treatment or other patient management decisions. Negative results must be combined with clinical observations, patient history, and epidemiological information. The expected result is Negative. Fact Sheet for  Patients: SugarRoll.be Fact Sheet for Healthcare Providers: https://www.woods-mathews.com/ This test is not yet approved or cleared by the [place] FDA and  has been authorized for detection and/or diagnosis of SARS-CoV-2 by FDA under an Emergency Use Authorization (EUA). This EUA will remain  in effect (meaning this test can be used) for the duration of the COVID-19 declaration under Section 56 4(b)(1) of the Act, 21 U.S.C. section 360bbb-3(b)(1), unless the authorization is terminated or revoked sooner. Performed at [facility], [place] [address] [place],  [address]   Surgical pcr screen     Status: None   Collection Time: 04/13/19  3:02 AM   Specimen: Nasal Mucosa; Nasal Swab  Result Value Ref Range Status   MRSA, PCR NEGATIVE NEGATIVE Final   Staphylococcus aureus NEGATIVE NEGATIVE Final    Comment: (NOTE) The Xpert SA Assay (FDA approved for NASAL specimens in patients 39 years of age and older), is one component of a comprehensive surveillance program. It is not intended to diagnose infection nor to guide or monitor treatment. Performed at [facility], [place] [address] [place], [place] [address]   Culture, blood (routine x 2)     Status: None   Collection Time: 04/16/19  6:35 PM   Specimen: BLOOD  Result Value Ref Range Status   Specimen Description BLOOD RIGHT ARM  Final   Special Requests   Final    BOTTLES DRAWN AEROBIC AND ANAEROBIC Blood Culture adequate volume Performed at [facility], [place] [address], [place], [place] [address]    Culture NO GROWTH 5 DAYS  Final   Report Status 04/21/2019 FINAL  Final  Culture, blood (routine x 2)     Status: None   Collection Time: 04/16/19  6:39 PM   Specimen: BLOOD  Result Value Ref Range Status   Specimen Description BLOOD LEFT ARM  Final   Special Requests   Final    BOTTLES DRAWN AEROBIC ONLY Blood Culture adequate volume Performed at [facility], [place] [address], [place], [place] [address]    Culture NO GROWTH 5 DAYS  Final   Report Status 04/21/2019 FINAL  Final  Culture, Urine     Status: Abnormal   Collection Time: 04/17/19  4:56 PM   Specimen: Urine, Random  Result Value Ref Range Status   Specimen Description URINE, RANDOM  Final   Special Requests ADDED 0320 04/18/2019  Final   Culture 20,000 COLONIES/mL ENTEROCOCCUS FAECALIS (A)  Final   Report Status 04/19/2019 FINAL  Final   Organism ID, Bacteria ENTEROCOCCUS FAECALIS (A)  Final      Susceptibility   Enterococcus faecalis - MIC*    AMPICILLIN <=2 SENSITIVE Sensitive     NITROFURANTOIN <=16 SENSITIVE Sensitive     VANCOMYCIN 1 SENSITIVE Sensitive     * 20,000 COLONIES/mL ENTEROCOCCUS FAECALIS    Imaging: DG Abd Portable 1V-Small Bowel Obstruction Protocol-initial, 8 hr delay  Result Date: 04/25/2019 CLINICAL DATA:  64 year old male with small bowel obstruction. EXAM: PORTABLE ABDOMEN - 1 VIEW COMPARISON:  CT abdomen pelvis dated 04/23/2019. FINDINGS: Air distended loops of small bowel measure up to 4 cm. Air is noted within the colon. No definite free air identified. Partially visualized enteric tube with tip in the epigastric area. Degenerative changes of the spine. Left hip arthroplasty and status post internal fixation of right intertrochanteric fracture. The IMPRESSION: Air distended small bowel loops with air throughout the colon. Findings may represent a degree of obstruction or an ileus. Clinical correlation is recommended. Electronically Signed   By: Anner Crete M.D.   On: 04/25/2019 22:10

## 2019-04-26 NOTE — Progress Notes (Signed)
Patient feels hungry.  He states he is passing gas but not having any bowel movements.  Patient is sitting in the bedside chair in no distress whatsoever.  Abdomen remains distended and tympanic, bowel sounds are quiet, no overt tenderness.  NG output decreasing--clear amber fluid.    Potassium holding steady at 4.2 despite NG suction.    Follow-up KUB from today is pending.    Impression: Postop ileus, etiology unclear, appears to be resolving based on diminished NG output  Recommendation: Await follow-up film.  Reassess tomorrow.  Possible clamp of NG tube if films are improved.  Will order repeat KUB for tomorrow morning.  Cleotis Nipper, M.D. Pager [phone] If no answer or after 5 PM call [phone]
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
## 2019-04-26 NOTE — TOC Progression Note (Signed)
Transition of Care [facility]) - Progression Note    Patient Details  Name: Duane Richardson MRN: [id number] Date of Birth: [birth year]  Transition of Care [facility]) CM/SW [place], [place] Phone Number: 04/26/2019, 11:45 AM  Clinical Narrative:    Plan is for pt to transfer to [facility] from hospital.  At this time not medically stable for discharge, will need a new COVID screen within 24/48 hrs.    Expected Discharge Plan: [place] Barriers to Discharge: Continued Medical Work up, SNF Pending bed offer  Expected Discharge Plan and Services Expected Discharge Plan: [place] In-house Referral: Clinical Social Work Discharge Planning Services: CM Consult Post Acute Care Choice: [place], Durable Medical Equipment Living arrangements for the past 2 months: [place]  Readmission Risk Interventions Readmission Risk Prevention Plan 04/20/2019  Transportation Screening Complete  PCP or Specialist Appt within 5-7 Days Not Complete  Not Complete comments plan for SNF  Home Care Screening Complete  Medication Review (RN CM) Referral to Pharmacy  Some recent data might be hidden

## 2019-04-27 ENCOUNTER — Inpatient Hospital Stay (HOSPITAL_COMMUNITY): Payer: Medicare Other

## 2019-04-27 DIAGNOSIS — S728X1A Other fracture of right femur, initial encounter for closed fracture: Secondary | ICD-10-CM | POA: Diagnosis not present

## 2019-04-27 DIAGNOSIS — S72141A Displaced intertrochanteric fracture of right femur, initial encounter for closed fracture: Secondary | ICD-10-CM | POA: Diagnosis not present

## 2019-04-27 DIAGNOSIS — M25551 Pain in right hip: Secondary | ICD-10-CM | POA: Diagnosis not present

## 2019-04-27 LAB — CBC WITH DIFFERENTIAL/PLATELET
Abs Immature Granulocytes: 0.44 10*3/uL — ABNORMAL HIGH (ref 0.00–0.07)
Basophils Absolute: 0.2 10*3/uL — ABNORMAL HIGH (ref 0.0–0.1)
Basophils Relative: 1 %
Eosinophils Absolute: 0.2 10*3/uL (ref 0.0–0.5)
Eosinophils Relative: 1 %
HCT: 34 % — ABNORMAL LOW (ref 39.0–52.0)
Hemoglobin: 11.3 g/dL — ABNORMAL LOW (ref 13.0–17.0)
Immature Granulocytes: 2 %
Lymphocytes Relative: 9 %
Lymphs Abs: 1.8 10*3/uL (ref 0.7–4.0)
MCH: 31.1 pg (ref 26.0–34.0)
MCHC: 33.2 g/dL (ref 30.0–36.0)
MCV: 93.7 fL (ref 80.0–100.0)
Monocytes Absolute: 2.8 10*3/uL — ABNORMAL HIGH (ref 0.1–1.0)
Monocytes Relative: 14 %
Neutro Abs: 14.6 10*3/uL — ABNORMAL HIGH (ref 1.7–7.7)
Neutrophils Relative %: 73 %
Platelets: 569 10*3/uL — ABNORMAL HIGH (ref 150–400)
RBC: 3.63 MIL/uL — ABNORMAL LOW (ref 4.22–5.81)
RDW: 13.2 % (ref 11.5–15.5)
WBC: 19.9 10*3/uL — ABNORMAL HIGH (ref 4.0–10.5)
nRBC: 0 % (ref 0.0–0.2)

## 2019-04-27 LAB — COMPREHENSIVE METABOLIC PANEL
ALT: 17 U/L (ref 0–44)
AST: 24 U/L (ref 15–41)
Albumin: 2.5 g/dL — ABNORMAL LOW (ref 3.5–5.0)
Alkaline Phosphatase: 75 U/L (ref 38–126)
Anion gap: 9 (ref 5–15)
BUN: 12 mg/dL (ref 8–23)
CO2: 23 mmol/L (ref 22–32)
Calcium: 8.5 mg/dL — ABNORMAL LOW (ref 8.9–10.3)
Chloride: 105 mmol/L (ref 98–111)
Creatinine, Ser: 0.64 mg/dL (ref 0.61–1.24)
GFR calc Af Amer: >60 mL/min (ref >60)
GFR calc non Af Amer: >60 mL/min (ref >60)
Glucose, Bld: 94 mg/dL (ref 70–99)
Potassium: 3.8 mmol/L (ref 3.5–5.1)
Sodium: 137 mmol/L (ref 135–145)
Total Bilirubin: 1.2 mg/dL (ref 0.3–1.2)
Total Protein: 6.1 g/dL — ABNORMAL LOW (ref 6.5–8.1)

## 2019-04-27 LAB — MAGNESIUM: Magnesium: 1.8 mg/dL (ref 1.7–2.4)

## 2019-04-27 LAB — PHOSPHORUS: Phosphorus: 3.1 mg/dL (ref 2.5–4.6)

## 2019-04-27 MED ORDER — SODIUM CHLORIDE 0.9 % IV SOLN
250.0000 mg | Freq: Two times a day (BID) | INTRAVENOUS | Status: DC
  Administered 2019-04-27 – 2019-05-04 (×15): 250 mg via INTRAVENOUS
  Filled 2019-04-27 (×21): qty 5

## 2019-04-27 NOTE — Plan of Care (Signed)

## 2019-04-27 NOTE — Plan of Care (Signed)
  Problem: Education: Goal: Knowledge of General Education information will improve Description: Including pain rating scale, medication(s)/side effects and non-pharmacologic comfort measures Outcome: Progressing   Problem: Health Behavior/Discharge Planning: Goal: Ability to manage health-related needs will improve Outcome: Progressing   Problem: Clinical Measurements: Goal: Ability to maintain clinical measurements within normal limits will improve Outcome: Progressing Goal: Will remain free from infection Outcome: Progressing Goal: Respiratory complications will improve Outcome: Progressing Goal: Cardiovascular complication will be avoided Outcome: Progressing   Problem: Activity: Goal: Risk for activity intolerance will decrease Outcome: Progressing   Problem: Coping: Goal: Level of anxiety will decrease Outcome: Progressing   Problem: Elimination: Goal: Will not experience complications related to urinary retention Outcome: Progressing   Problem: Pain Managment: Goal: General experience of comfort will improve Outcome: Progressing   Problem: Safety: Goal: Ability to remain free from injury will improve Outcome: Progressing   Problem: Skin Integrity: Goal: Risk for impaired skin integrity will decrease Outcome: Progressing   

## 2019-04-27 NOTE — TOC Progression Note (Signed)
Transition of Care [facility] - Progression Note    Patient Details  Name: Duane Richardson MRN: [id number] Date of Birth: [birth year]  Transition of Care [facility]) CM/SW [place], [place] Phone Number: 04/27/2019, 10:59 AM  Clinical Narrative:    CSW continuing to follow for medical stability. Have updated [facility] with delay, they continue to follow for placement at discharge.    Expected Discharge Plan: [place] Barriers to Discharge: Continued Medical Work up, SNF Pending bed offer  Expected Discharge Plan and Services Expected Discharge Plan: [place] In-house Referral: Clinical Social Work Discharge Planning Services: CM Consult Post Acute Care Choice: [place], Durable Medical Equipment Living arrangements for the past 2 months: [place]  Readmission Risk Interventions Readmission Risk Prevention Plan 04/20/2019  Transportation Screening Complete  PCP or Specialist Appt within 5-7 Days Not Complete  Not Complete comments plan for SNF  Home Care Screening Complete  Medication Review (RN CM) Referral to Pharmacy  Some recent data might be hidden

## 2019-04-27 NOTE — Progress Notes (Signed)
Duane Richardson 5:39 PM  Subjective: Patient continues to look and sound better than his x-ray and he continues to want to eat and he has moved his bowels and has no other complaints and is walking in the hall NG still with significant output Objective: Vital signs stable afebrile no acute distress abdomen slight distention minimal tympany occasional bowel sounds nontender soft potassium 3.8 magnesium low normal 1.8 white count decreased to 19 x-ray with more small bowel air but less colonic air  Assessment: Postop ileus  Plan: If doing well tomorrow and no contraindication it might be worth trying clamping NG and just seeing how he does with clear liquids otherwise will start erythromycin and asked my partner to check on tomorrow  [facility]  office [phone] After 5PM or if no answer call [phone]
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
## 2019-04-28 ENCOUNTER — Inpatient Hospital Stay (HOSPITAL_COMMUNITY): Payer: Medicare Other

## 2019-04-28 DIAGNOSIS — S72141A Displaced intertrochanteric fracture of right femur, initial encounter for closed fracture: Secondary | ICD-10-CM | POA: Diagnosis not present

## 2019-04-28 DIAGNOSIS — M25551 Pain in right hip: Secondary | ICD-10-CM | POA: Diagnosis not present

## 2019-04-28 DIAGNOSIS — S728X1A Other fracture of right femur, initial encounter for closed fracture: Secondary | ICD-10-CM | POA: Diagnosis not present

## 2019-04-28 LAB — CBC WITH DIFFERENTIAL/PLATELET
Abs Immature Granulocytes: 0.33 10*3/uL — ABNORMAL HIGH (ref 0.00–0.07)
Basophils Absolute: 0.2 10*3/uL — ABNORMAL HIGH (ref 0.0–0.1)
Basophils Relative: 1 %
Eosinophils Absolute: 0.2 10*3/uL (ref 0.0–0.5)
Eosinophils Relative: 1 %
HCT: 31.9 % — ABNORMAL LOW (ref 39.0–52.0)
Hemoglobin: 10.5 g/dL — ABNORMAL LOW (ref 13.0–17.0)
Immature Granulocytes: 2 %
Lymphocytes Relative: 9 %
Lymphs Abs: 1.5 10*3/uL (ref 0.7–4.0)
MCH: 30.9 pg (ref 26.0–34.0)
MCHC: 32.9 g/dL (ref 30.0–36.0)
MCV: 93.8 fL (ref 80.0–100.0)
Monocytes Absolute: 2.1 10*3/uL — ABNORMAL HIGH (ref 0.1–1.0)
Monocytes Relative: 13 %
Neutro Abs: 12.4 10*3/uL — ABNORMAL HIGH (ref 1.7–7.7)
Neutrophils Relative %: 74 %
Platelets: 546 10*3/uL — ABNORMAL HIGH (ref 150–400)
RBC: 3.4 MIL/uL — ABNORMAL LOW (ref 4.22–5.81)
RDW: 13.2 % (ref 11.5–15.5)
WBC: 16.7 10*3/uL — ABNORMAL HIGH (ref 4.0–10.5)
nRBC: 0 % (ref 0.0–0.2)

## 2019-04-28 LAB — COMPREHENSIVE METABOLIC PANEL
ALT: 17 U/L (ref 0–44)
AST: 27 U/L (ref 15–41)
Albumin: 2.5 g/dL — ABNORMAL LOW (ref 3.5–5.0)
Alkaline Phosphatase: 81 U/L (ref 38–126)
Anion gap: 11 (ref 5–15)
BUN: 7 mg/dL — ABNORMAL LOW (ref 8–23)
CO2: 20 mmol/L — ABNORMAL LOW (ref 22–32)
Calcium: 8.6 mg/dL — ABNORMAL LOW (ref 8.9–10.3)
Chloride: 105 mmol/L (ref 98–111)
Creatinine, Ser: 0.61 mg/dL (ref 0.61–1.24)
GFR calc Af Amer: >60 mL/min (ref >60)
GFR calc non Af Amer: >60 mL/min (ref >60)
Glucose, Bld: 89 mg/dL (ref 70–99)
Potassium: 3.7 mmol/L (ref 3.5–5.1)
Sodium: 136 mmol/L (ref 135–145)
Total Bilirubin: 1.2 mg/dL (ref 0.3–1.2)
Total Protein: 5.9 g/dL — ABNORMAL LOW (ref 6.5–8.1)

## 2019-04-28 LAB — PHOSPHORUS: Phosphorus: 3.4 mg/dL (ref 2.5–4.6)

## 2019-04-28 LAB — SARS CORONAVIRUS 2 (TAT 6-24 HRS): SARS Coronavirus 2: NEGATIVE

## 2019-04-28 LAB — MAGNESIUM: Magnesium: 1.7 mg/dL (ref 1.7–2.4)

## 2019-04-28 MED ORDER — BOOST / RESOURCE BREEZE PO LIQD CUSTOM
1.0000 | Freq: Two times a day (BID) | ORAL | Status: DC
  Administered 2019-04-28: 1 via ORAL

## 2019-04-28 MED ORDER — POTASSIUM CHLORIDE 20 MEQ/15ML (10%) PO SOLN
40.0000 meq | Freq: Two times a day (BID) | ORAL | Status: AC
  Administered 2019-04-28 – 2019-04-29 (×3): 40 meq
  Filled 2019-04-28 (×3): qty 30
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
## 2019-04-28 NOTE — Plan of Care (Signed)

## 2019-04-28 NOTE — TOC Progression Note (Signed)
Transition of Care [facility]) - Progression Note    Patient Details  Name: Duane Richardson MRN: [id number] Date of Birth: 03-10-1955  Transition of Care [facility]) CM/SW [place], [place] Phone Number: 04/28/2019, 2:53 PM  Clinical Narrative:    Requested new COVID swab to be ordered from Dr. Pietro Cassis.    Expected Discharge Plan: [place] Barriers to Discharge: Continued Medical Work up, SNF Pending bed offer  Expected Discharge Plan and Services Expected Discharge Plan: [place] In-house Referral: Clinical Social Work Discharge Planning Services: CM Consult Post Acute Care Choice: [place], [place] arrangements for the past 2 months: [place]  Readmission Risk Interventions Readmission Risk Prevention Plan 04/20/2019  Transportation Screening Complete  PCP or Specialist Appt within 5-7 Days Not Complete  Not Complete comments plan for SNF  Home Care Screening Complete  Medication Review (RN CM) Referral to Pharmacy  Some recent data might be hidden

## 2019-04-28 NOTE — Progress Notes (Signed)
Initial Nutrition Assessment  DOCUMENTATION CODES:   Not applicable  INTERVENTION:   -Boost Breeze po BID, each supplement provides 250 kcal and 9 grams of protein -RD will follow for diet advancement and adjust supplements regimen as appropriate -If prolonged NPO/clear liquid diet status is anticipated, consider initiation of nutrition support  NUTRITION DIAGNOSIS:   Increased nutrient needs related to post-op healing as evidenced by estimated needs.  GOAL:   Patient will meet greater than or equal to 90% of their needs  MONITOR:   PO intake, Supplement acceptance, Diet advancement, Labs, Weight trends, Skin, I & O's  REASON FOR ASSESSMENT:   NPO/Clear Liquid Diet    ASSESSMENT:   Duane Richardson is a 64 y.o. male with medical history significant for COPD, alcohol abuse, hypertension, GERD, arthritis and left hip fracture s/p ORIF repair by Dr. [name].  Pt admitted with rt femur fracture and alcohol withdrawal.   2/17- transferred from [facility] to [facility] for orthopedics evaluation  2/18- s/p PROCEDURE: Open treatment of intertrochanteric fracture with intramedullary implant. CPT [phone]  2/28- CT and x-ray revealed ileus; NGT placed  Pt out of room at time of visit. He has been NPO since 04/24/19 due to post-op ileus.  Per GI notes, plan to check films today and start possible NGT clamping trials.   Reviewed wt hx; wt has been stable over the past year.   Per CSW notes, plan to d/c to SNF [facility]) once medically stable.   Medications reviewed and include folic acid, vitamin N-47, and dextrose 5% and 0.45% NaCl with KCl 20 mEq/L infusion @ 75 ml/hr.    Labs reviewed.   Diet Order:   Diet Order            Diet clear liquid Room service appropriate? Yes; Fluid consistency: Thin  Diet effective now              EDUCATION NEEDS:   No education needs have been identified at this time  Skin:  Skin Assessment: Skin Integrity Issues: Skin Integrity Issues::  Incisions Incisions: rt hip  Last BM:  04/26/19  Height:   Ht Readings from Last 1 Encounters:  04/12/19 5\' 8"  (1.727 m)    Weight:   Wt Readings from Last 1 Encounters:  04/24/19 72.8 kg    Ideal Body Weight:  70 kg  BMI:  Body mass index is 24.4 kg/m.  Estimated Nutritional Needs:   Kcal:  1950-2150  Protein:  95-110 grams  Fluid:  > 1.9 L    Loistine Chance, RD, LDN, [place] Registered Dietitian II Certified Diabetes Care and Education Specialist Please refer to [facility] for RD and/or RD on-call/weekend/after hours pager

## 2019-04-28 NOTE — Progress Notes (Signed)
Patient reports feeling well.  His NG tube is currently clamped, and he is tolerating clears without nausea or vomiting.  He denies abdominal pain.  He reports several bowel movements over the past two to three days.    Patient is sitting in the bedside chair in no distress whatsoever.  Abdomen remains somewhat distended with hypoactive bowel sounds.  No tenderness to palpation, no succussion splash.  He had 1059mL of NG output yesterday; however, output has decreased today --currently at 200 for the day.    KUB today showed significant improvement in small bowel dilatation and no colonic dilatation.  Potassium level 3.7 today, essentially stable from yesterday.  White count continues to improve, hemoglobin down slightly at 10.5.  Impression: Postop ileus, etiology unclear, appears to be resolving based on imaging and decreasing NG output  Recommendation:   1.  Agree with clears as tolerated.  May increase to full liquids tomorrow depending on patient status and results of repeat KUB tomorrow morning.  2.  Have ordered KCl per NG for 3 doses in view of borderline K+ level.  Cleotis Nipper, M.D. Pager [phone] If no answer or after 5 PM call [phone]
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
## 2019-04-29 ENCOUNTER — Inpatient Hospital Stay (HOSPITAL_COMMUNITY): Payer: Medicare Other

## 2019-04-29 DIAGNOSIS — S72141A Displaced intertrochanteric fracture of right femur, initial encounter for closed fracture: Secondary | ICD-10-CM | POA: Diagnosis not present

## 2019-04-29 DIAGNOSIS — M25551 Pain in right hip: Secondary | ICD-10-CM | POA: Diagnosis not present

## 2019-04-29 DIAGNOSIS — S728X1A Other fracture of right femur, initial encounter for closed fracture: Secondary | ICD-10-CM | POA: Diagnosis not present

## 2019-04-29 LAB — CBC WITH DIFFERENTIAL/PLATELET
Abs Immature Granulocytes: 0.32 10*3/uL — ABNORMAL HIGH (ref 0.00–0.07)
Basophils Absolute: 0.1 10*3/uL (ref 0.0–0.1)
Basophils Relative: 1 %
Eosinophils Absolute: 0.1 10*3/uL (ref 0.0–0.5)
Eosinophils Relative: 1 %
HCT: 32.5 % — ABNORMAL LOW (ref 39.0–52.0)
Hemoglobin: 10.8 g/dL — ABNORMAL LOW (ref 13.0–17.0)
Immature Granulocytes: 2 %
Lymphocytes Relative: 8 %
Lymphs Abs: 1.3 10*3/uL (ref 0.7–4.0)
MCH: 30.6 pg (ref 26.0–34.0)
MCHC: 33.2 g/dL (ref 30.0–36.0)
MCV: 92.1 fL (ref 80.0–100.0)
Monocytes Absolute: 2 10*3/uL — ABNORMAL HIGH (ref 0.1–1.0)
Monocytes Relative: 13 %
Neutro Abs: 12 10*3/uL — ABNORMAL HIGH (ref 1.7–7.7)
Neutrophils Relative %: 75 %
Platelets: 549 10*3/uL — ABNORMAL HIGH (ref 150–400)
RBC: 3.53 MIL/uL — ABNORMAL LOW (ref 4.22–5.81)
RDW: 13.2 % (ref 11.5–15.5)
WBC: 15.9 10*3/uL — ABNORMAL HIGH (ref 4.0–10.5)
nRBC: 0 % (ref 0.0–0.2)

## 2019-04-29 MED ORDER — WHITE PETROLATUM EX OINT
TOPICAL_OINTMENT | CUTANEOUS | Status: AC
  Administered 2019-04-29: 0.2
  Filled 2019-04-29: qty 28.35
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
## 2019-04-29 NOTE — Progress Notes (Signed)
Patient does not look as good today, following overnight clamping of his NG tube.  He says his stomach is "rolling."  KUB shows increased diameter of small bowel loops compared to yesterday.  Bowel sounds are quiet, the abdomen is firm and tense.  I aspirated the NG tube and got out 300 mL of amber-colored clear fluid.  Metabolic panel was not checked today.  Therefore, we do not know where his potassium stands as of this time.  Impression: Recurrent ileus  Plan:  1.  Replace NG to low intermittent suction  2.  Monitor potassium (already ordered for tomorrow)  Cleotis Nipper, M.D. Pager [phone] If no answer or after 5 PM call [phone]
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
## 2019-04-29 NOTE — TOC Progression Note (Signed)
Transition of Care [facility]) - Progression Note    Patient Details  Name: Duane Richardson MRN: [id number] Date of Birth: 04/23/1955  Transition of [facility]) CM/SW [place], [place] Phone Number: 04/29/2019, 4:32 PM  Clinical Narrative:    NG Tube placed back to suction. CSW has let facility know that pt still remains inpatient, they continue to await stability for admission.   CSW spoke with pt CSW Mateo Flow through [place]. She came to visit with pt today and brought him things from home, she is aware of plan and agreeable.    Expected Discharge Plan: [place] Barriers to Discharge: Continued Medical Work up, SNF Pending bed offer  Expected Discharge Plan and Services Expected Discharge Plan: [place] In-house Referral: Clinical Social Work Discharge Planning Services: CM Consult Post Acute Care Choice: [place], Durable Medical Equipment Living arrangements for the past 2 months: [place]   Readmission Risk Interventions Readmission Risk Prevention Plan 04/20/2019  Transportation Screening Complete  PCP or Specialist Appt within 5-7 Days Not Complete  Not Complete comments plan for SNF  Home Care Screening Complete  Medication Review (RN CM) Referral to Pharmacy  Some recent data might be hidden

## 2019-04-29 NOTE — Plan of Care (Signed)
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
## 2019-04-30 ENCOUNTER — Inpatient Hospital Stay (HOSPITAL_COMMUNITY): Payer: Medicare Other

## 2019-04-30 DIAGNOSIS — S728X1A Other fracture of right femur, initial encounter for closed fracture: Secondary | ICD-10-CM | POA: Diagnosis not present

## 2019-04-30 DIAGNOSIS — M25551 Pain in right hip: Secondary | ICD-10-CM | POA: Diagnosis not present

## 2019-04-30 DIAGNOSIS — S72141A Displaced intertrochanteric fracture of right femur, initial encounter for closed fracture: Secondary | ICD-10-CM | POA: Diagnosis not present

## 2019-04-30 LAB — BASIC METABOLIC PANEL
Anion gap: 8 (ref 5–15)
BUN: <5 mg/dL — ABNORMAL LOW (ref 8–23)
CO2: 20 mmol/L — ABNORMAL LOW (ref 22–32)
Calcium: 8.3 mg/dL — ABNORMAL LOW (ref 8.9–10.3)
Chloride: 104 mmol/L (ref 98–111)
Creatinine, Ser: 0.61 mg/dL (ref 0.61–1.24)
GFR calc Af Amer: >60 mL/min (ref >60)
GFR calc non Af Amer: >60 mL/min (ref >60)
Glucose, Bld: 92 mg/dL (ref 70–99)
Potassium: 3.8 mmol/L (ref 3.5–5.1)
Sodium: 132 mmol/L — ABNORMAL LOW (ref 135–145)

## 2019-04-30 LAB — CBC WITH DIFFERENTIAL/PLATELET
Abs Immature Granulocytes: 0.39 K/uL — ABNORMAL HIGH (ref 0.00–0.07)
Basophils Absolute: 0.1 K/uL (ref 0.0–0.1)
Basophils Relative: 1 %
Eosinophils Absolute: 0.2 K/uL (ref 0.0–0.5)
Eosinophils Relative: 2 %
HCT: 33.3 % — ABNORMAL LOW (ref 39.0–52.0)
Hemoglobin: 10.9 g/dL — ABNORMAL LOW (ref 13.0–17.0)
Immature Granulocytes: 3 %
Lymphocytes Relative: 11 %
Lymphs Abs: 1.5 K/uL (ref 0.7–4.0)
MCH: 30.4 pg (ref 26.0–34.0)
MCHC: 32.7 g/dL (ref 30.0–36.0)
MCV: 93 fL (ref 80.0–100.0)
Monocytes Absolute: 1.8 K/uL — ABNORMAL HIGH (ref 0.1–1.0)
Monocytes Relative: 13 %
Neutro Abs: 9.9 K/uL — ABNORMAL HIGH (ref 1.7–7.7)
Neutrophils Relative %: 70 %
Platelets: 482 K/uL — ABNORMAL HIGH (ref 150–400)
RBC: 3.58 MIL/uL — ABNORMAL LOW (ref 4.22–5.81)
RDW: 13 % (ref 11.5–15.5)
WBC: 13.8 K/uL — ABNORMAL HIGH (ref 4.0–10.5)
nRBC: 0 % (ref 0.0–0.2)

## 2019-04-30 LAB — MAGNESIUM: Magnesium: 1.6 mg/dL — ABNORMAL LOW (ref 1.7–2.4)

## 2019-04-30 LAB — PHOSPHORUS: Phosphorus: 3.4 mg/dL (ref 2.5–4.6)

## 2019-04-30 MED ORDER — POTASSIUM CHLORIDE 20 MEQ/15ML (10%) PO SOLN
40.0000 meq | Freq: Four times a day (QID) | ORAL | Status: DC
  Administered 2019-04-30: 40 meq
  Filled 2019-04-30: qty 30

## 2019-04-30 MED ORDER — LIP MEDEX EX OINT
TOPICAL_OINTMENT | CUTANEOUS | Status: DC | PRN
  Administered 2019-05-04: 1 via TOPICAL
  Filled 2019-04-30 (×2): qty 7

## 2019-04-30 NOTE — Progress Notes (Signed)
KUB looks less favorable today, but patient states he is feeling better.  He is begging for Jell-O or chicken broth.  On exam, the abdomen is moderately distended, slightly firm, and moderately tympanic although bowel sounds are present.  Potassium today remains in the low normal range at 3.8.  This indicates a significant total body deficit of potassium.  Plan:  1.  Repeat films and metabolic panel tomorrow  2.  Aggressive potassium supplementation per NG tube.  We need to get the patient's potassium above 4, and preferably more like 4.5, for several consecutive days before intracellular potassium will be sufficiently equilibrated, at which time hopefully the ileus will resolve.  Duane Richardson, M.D. Pager [phone] If no answer or after 5 PM call [phone]

## 2019-04-30 NOTE — Plan of Care (Signed)
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
## 2019-05-01 ENCOUNTER — Inpatient Hospital Stay (HOSPITAL_COMMUNITY): Payer: Medicare Other

## 2019-05-01 DIAGNOSIS — S728X1A Other fracture of right femur, initial encounter for closed fracture: Secondary | ICD-10-CM | POA: Diagnosis not present

## 2019-05-01 DIAGNOSIS — S72141A Displaced intertrochanteric fracture of right femur, initial encounter for closed fracture: Secondary | ICD-10-CM | POA: Diagnosis not present

## 2019-05-01 DIAGNOSIS — M25551 Pain in right hip: Secondary | ICD-10-CM | POA: Diagnosis not present

## 2019-05-01 LAB — BASIC METABOLIC PANEL
Anion gap: 10 (ref 5–15)
BUN: <5 mg/dL — ABNORMAL LOW (ref 8–23)
CO2: 19 mmol/L — ABNORMAL LOW (ref 22–32)
Calcium: 8.5 mg/dL — ABNORMAL LOW (ref 8.9–10.3)
Chloride: 103 mmol/L (ref 98–111)
Creatinine, Ser: 0.62 mg/dL (ref 0.61–1.24)
GFR calc Af Amer: >60 mL/min (ref >60)
GFR calc non Af Amer: >60 mL/min (ref >60)
Glucose, Bld: 85 mg/dL (ref 70–99)
Potassium: 4 mmol/L (ref 3.5–5.1)
Sodium: 132 mmol/L — ABNORMAL LOW (ref 135–145)

## 2019-05-01 MED ORDER — POTASSIUM CHLORIDE 20 MEQ/15ML (10%) PO SOLN
40.0000 meq | Freq: Two times a day (BID) | ORAL | Status: AC
  Administered 2019-05-01 (×2): 40 meq
  Filled 2019-05-01 (×2): qty 30

## 2019-05-01 NOTE — Progress Notes (Signed)
Postoperative ileus.  Unfortunately, still no progress radiographically.  Patient's potassium has climbed slightly to 4.0.  Further potassium doses per NG tube have been ordered.    Patient states he is not passing gas nor having bowel movements.  He is feeling okay, but I do not think he is noticeably improved from yesterday.  Exam: NG is draining moderate amounts of green bilious clear fluid.  The abdomen seems to be less distended.  No bowel sounds.  Moderate diffuse tympany.  No tenderness.  Labs: Potassium 4.0  KUB: Unfortunately, gas pattern is similar to yesterday and perhaps even slightly worse  Impression: Lingering ileus   Plan: Anticipate that once the patient's potassium has been above 4.0 for several consecutive days, that the ileus will resolve.  In the meantime, will allow small amounts of clear liquids, primarily for patient comfort, to be given whenever the tube is clamped for administration of potassium.  However, unlike yesterday, he is no longer interested in taking clear liquids.  Cleotis Nipper, M.D. Pager [phone] If no answer or after 5 PM call [phone]
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
## 2019-05-01 NOTE — Plan of Care (Signed)

## 2019-05-02 ENCOUNTER — Inpatient Hospital Stay (HOSPITAL_COMMUNITY): Payer: Medicare Other

## 2019-05-02 ENCOUNTER — Inpatient Hospital Stay: Payer: Self-pay

## 2019-05-02 DIAGNOSIS — S72141A Displaced intertrochanteric fracture of right femur, initial encounter for closed fracture: Secondary | ICD-10-CM | POA: Diagnosis not present

## 2019-05-02 DIAGNOSIS — M25551 Pain in right hip: Secondary | ICD-10-CM | POA: Diagnosis not present

## 2019-05-02 DIAGNOSIS — S728X1A Other fracture of right femur, initial encounter for closed fracture: Secondary | ICD-10-CM | POA: Diagnosis not present

## 2019-05-02 LAB — BASIC METABOLIC PANEL
Anion gap: 9 (ref 5–15)
BUN: <5 mg/dL — ABNORMAL LOW (ref 8–23)
CO2: 20 mmol/L — ABNORMAL LOW (ref 22–32)
Calcium: 8.8 mg/dL — ABNORMAL LOW (ref 8.9–10.3)
Chloride: 102 mmol/L (ref 98–111)
Creatinine, Ser: 0.55 mg/dL — ABNORMAL LOW (ref 0.61–1.24)
GFR calc Af Amer: >60 mL/min (ref >60)
GFR calc non Af Amer: >60 mL/min (ref >60)
Glucose, Bld: 100 mg/dL — ABNORMAL HIGH (ref 70–99)
Potassium: 4.1 mmol/L (ref 3.5–5.1)
Sodium: 131 mmol/L — ABNORMAL LOW (ref 135–145)

## 2019-05-02 LAB — GLUCOSE, CAPILLARY
Glucose-Capillary: 93 mg/dL (ref 70–99)
Glucose-Capillary: 105 mg/dL — ABNORMAL HIGH (ref 70–99)

## 2019-05-02 LAB — MAGNESIUM: Magnesium: 1.7 mg/dL (ref 1.7–2.4)

## 2019-05-02 LAB — PHOSPHORUS: Phosphorus: 3.8 mg/dL (ref 2.5–4.6)

## 2019-05-02 LAB — TRIGLYCERIDES: Triglycerides: 115 mg/dL (ref <150)

## 2019-05-02 MED ORDER — TRAVASOL 10 % IV SOLN
INTRAVENOUS | Status: AC
  Filled 2019-05-02: qty 480

## 2019-05-02 MED ORDER — POTASSIUM CHLORIDE 20 MEQ/15ML (10%) PO SOLN: 40.0000 meq | Freq: Two times a day (BID) | ORAL | Status: DC

## 2019-05-02 MED ORDER — POTASSIUM CHLORIDE 10 MEQ/100ML IV SOLN
10.0000 meq | INTRAVENOUS | Status: AC
  Administered 2019-05-02 – 2019-05-03 (×5): 10 meq via INTRAVENOUS
  Filled 2019-05-02 (×3): qty 100

## 2019-05-02 MED ORDER — DEXTROSE-NACL 5-0.45 % IV SOLN: INTRAVENOUS | Status: AC

## 2019-05-02 MED ORDER — POTASSIUM CHLORIDE 10 MEQ/100ML IV SOLN
10.0000 meq | INTRAVENOUS | Status: AC
  Filled 2019-05-02: qty 100

## 2019-05-02 MED ORDER — INSULIN ASPART 100 UNIT/ML ~~LOC~~ SOLN
0.0000 [IU] | Freq: Four times a day (QID) | SUBCUTANEOUS | Status: DC
  Administered 2019-05-03: 1 [IU] via SUBCUTANEOUS

## 2019-05-02 MED ORDER — MAGNESIUM SULFATE 2 GM/50ML IV SOLN
2.0000 g | Freq: Once | INTRAVENOUS | Status: AC
  Administered 2019-05-02: 2 g via INTRAVENOUS
  Filled 2019-05-02: qty 50

## 2019-05-02 MED ORDER — SODIUM CHLORIDE 0.9% FLUSH: 10.0000 mL | INTRAVENOUS | Status: DC | PRN

## 2019-05-02 NOTE — TOC Progression Note (Signed)
Transition of Care [facility]) - Progression Note    Patient Details  Name: Duane Richardson MRN: [id number] Date of Birth: 07/30/1955  Transition of Care [facility]) CM/SW [place], [place] Phone Number: 05/02/2019, 10:44 AM  Clinical Narrative:    Pt ng tube still to suction. Not medically appropriate yet for dc to SNF. CSW has updated [facility].   Expected Discharge Plan: [place] Barriers to Discharge: Continued Medical Work up(ng tube to suction)  Expected Discharge Plan and Services Expected Discharge Plan: [place] In-house Referral: Clinical Social Work Discharge Planning Services: CM Consult Post Acute Care Choice: [place], Durable Medical Equipment Living arrangements for the past 2 months: [place]  Readmission Risk Interventions Readmission Risk Prevention Plan 04/20/2019  Transportation Screening Complete  PCP or Specialist Appt within 5-7 Days Not Complete  Not Complete comments plan for SNF  Home Care Screening Complete  Medication Review (RN CM) Referral to Pharmacy  Some recent data might be hidden

## 2019-05-02 NOTE — Progress Notes (Signed)
Subjective: 18 Days Post-Op Procedure(s) (LRB): INTRAMEDULLARY (IM) NAIL INTERTROCHANTRIC (Right) Patient reports pain as mild.  Doing ok from ortho standpoint.    Objective: Vital signs in last 24 hours: Temp:  [98.5 F (36.9 C)-100.5 F (38.1 C)] 99.2 F (37.3 C) (03/08 0440) Pulse Rate:  [95-100] 98 (03/08 0440) Resp:  [18] 18 (03/08 0440) BP: (114-128)/(80-89) 128/89 (03/08 0440) SpO2:  [98 %-99 %] 98 % (03/08 0440)  Intake/Output from previous day: 03/07 0701 - 03/08 0700 In: 2075 [I.V.:1575; IV Piggyback:500] Out: 3450 [Urine:2400; Emesis/NG output:1050] Intake/Output this shift: No intake/output data recorded.  Recent Labs    04/30/19 0317  HGB 10.9*   Recent Labs    04/30/19 0317  WBC 13.8*  RBC 3.58*  HCT 33.3*  PLT 482*   Recent Labs    05/01/19 0243 05/02/19 0205  NA 132* 131*  K 4.0 4.1  CL 103 102  CO2 19* 20*  BUN <5* <5*  CREATININE 0.62 0.55*  GLUCOSE 85 100*  CALCIUM 8.5* 8.8*   No results for input(s): LABPT, INR in the last 72 hours.  Neurologically intact Neurovascular intact Sensation intact distally Intact pulses distally Dorsiflexion/Plantar flexion intact Incision: dressing C/D/I No cellulitis present Compartment soft  Staples still intact   Assessment/Plan: 18 Days Post-Op Procedure(s) (LRB): INTRAMEDULLARY (IM) NAIL INTERTROCHANTRIC (Right) Up with therapy  WBAT RLE Lovenox 40 daily for dvt ppx Remove staples today and apply steri strips xrays right femur show stable alignment of fracture without hardware complication F/u with Dr. [name] 6 weeks post-op     Aundra Dubin 05/02/2019, 9:22 AM

## 2019-05-02 NOTE — Progress Notes (Signed)
Peripherally Inserted Central Catheter Placement  The IV Nurse has discussed with the patient and/or persons authorized to consent for the patient, the purpose of this procedure and the potential benefits and risks involved with this procedure.  The benefits include less needle sticks, lab draws from the catheter, and the patient may be discharged home with the catheter. Risks include, but not limited to, infection, bleeding, blood clot (thrombus formation), and puncture of an artery; nerve damage and irregular heartbeat and possibility to perform a PICC exchange if needed/ordered by physician.  Alternatives to this procedure were also discussed.  Bard Power PICC patient education guide, fact sheet on infection prevention and patient information card has been provided to patient /or left at bedside.    PICC/Midline Placement Documentation  PICC Double Lumen 41/96/22 PICC Right Basilic 40 cm 1 cm (Active)  Indication for Insertion or Continuance of Line Administration of hyperosmolar/irritating solutions (i.e. TPN, Vancomycin, etc.) 05/02/19 1507  Exposed Catheter (cm) 1 cm 05/02/19 1507  Site Assessment Clean;Dry;Intact 05/02/19 1507  Lumen #1 Status Flushed;Saline locked;Blood return noted 05/02/19 1507  Lumen #2 Status Flushed;Saline locked;Blood return noted 05/02/19 1507  Dressing Type Transparent;Securing device 05/02/19 1507  Dressing Status Clean;Dry;Intact;Antimicrobial disc in place 05/02/19 1507  Dressing Intervention New dressing 05/02/19 1507  Dressing Change Due 05/09/19 05/02/19 Marvin, Novalynn Branaman 05/02/2019, 3:09 PM

## 2019-05-02 NOTE — Progress Notes (Signed)
PHARMACY - TOTAL PARENTERAL NUTRITION CONSULT NOTE   Indication: Concern for Ileus vs SBO, minimal po intake for>7d  Patient Measurements: Height: 5\' 8"  (172.7 cm) Weight: 160 lb 7.9 oz (72.8 kg) IBW/kg (Calculated) : 68.4 TPN AdjBW (KG): 70.3 Body mass index is 24.4 kg/m.  Assessment: [name] admitted since 2/17, s/p R-hip ORIF on 4/16 complicated by post-op ileus vs SBO. NGT placed for decompression and with significant output. It is noted that the patient was NPO or with minimal intake for <7d now - pharmacy consulted to start TPN for nutritional support.   Glucose / Insulin: CBGs are <150 - will start q4h CBG checks + SSI with start of TPN Electrolytes: Na 131, K 4.1 - K runs x 5 + D5-1/2 NS + K20 @ 75 cc/hr, Phos 3.8, Mg 1.7 - will give 2g x 1 today, will monitor closely for refeeding given malnutrition and hx EtOH abuse. Goal Mg>2, K>4 w/ concern for ileus Renal: SCr 0.55, appears to be at baseline LFTs / TGs: LFTs wnl on 3/4, TG 115 Prealbumin / albumin: Alb 2.5 on 3/4, no pre-albumin yet this admission Intake / Output; MIVF: NPO, NG output/24h: 800 cc (down from 950 on 3/6 and 1450 on 3/5), LBM 3/5 - on ERY 250 q12h, + flatus GI Imaging: 3/8 Abd - "continued gaseous distention of bowel, unchanged >> ileus vs SBO" Surgeries / Procedures: R-hip ORIF on 2/18  Central access: PICC to be placed by IV team 3/8 (contacted IV team to confirm) TPN start date: 3/8  Nutritional Goals (per RD recommendation on 3/8): kCal: 2200-2400, Protein: 110-125, Fluid: >2.2L Goal TPN rate is 100 mL/hr (provides 120 g of protein and 2232 kcals per day)  Current Nutrition:  NPO  Plan:  Start TPN at 40 mL/hr at 1800 - will provide 890 kcal and 48g protein Add lipids to bag starting with initiation Electrolytes in TPN: 29mEq/L of Na, 32mEq/L of K, 4mEq/L of Ca, 53mEq/L of Mg, and 66mmol/L of Phos. Cl:Ac ratio 1:1 D/c oral MV, FA, and thiamine while on TPN; will add FA and thiamine to the TPN bag Add  standard MVI and trace elements to TPN,  Initiate Sensitive q6h SSI and adjust as needed  Change IVF to plan D5-1/2NS and reduce to 35 cc/hr at 1800 when TPN initiated  Magnesium 2g IV x 1 today for repletion Add CBG and sensitive SSI q6h Monitor TPN labs on Mon/Thurs, Phos/Mg in the AM  Thank you for allowing pharmacy to be a part of this patient's care.  Alycia Rossetti, PharmD, BCPS Clinical Pharmacist Clinical phone for 05/02/2019: [phone] 05/02/2019 10:58 AM   **Pharmacist phone directory can now be found on [email or web address] ([email or web address]).  Listed under [place].

## 2019-05-02 NOTE — Progress Notes (Signed)
Nutrition Follow-up  RD working remotely.  DOCUMENTATION CODES:   Severe malnutrition in context of acute illness/injury  INTERVENTION:   -RD will follow for diet advancement and add supplements as appropriate -TPN to be initiated today, per pharmacy, per discussion with MD  NUTRITION DIAGNOSIS:   Severe Malnutrition related to acute illness(post-op ileus) as evidenced by mild fat depletion, moderate fat depletion, mild muscle depletion, moderate muscle depletion.  Ongoing  GOAL:   Patient will meet greater than or equal to 90% of their needs  Unmet  MONITOR:   PO intake, Supplement acceptance, Diet advancement, Labs, Weight trends, Skin, I & O's  REASON FOR ASSESSMENT:   NPO/Clear Liquid Diet    ASSESSMENT:   Duane Richardson is a 64 y.o. male with medical history significant for COPD, alcohol abuse, hypertension, GERD, arthritis and left hip fracture s/p ORIF repair by Dr. [name].  2/17- transferred [facility] for orthopedics evaluation  2/18- s/pPROCEDURE: Open treatment of intertrochanteric fracture with intramedullary implant. CPT [phone] 2/28- CT and x-ray revealed ileus; NGT placed  Reviewed I/O's: -1.4 L x 24 hours and -7.7 L since 04/18/19  UOP: 2.4 L x 24 hours  NGT output: 1.1 L x 24 hours  Per GI notes, pt with no progress over the weekend (not passing gas or having bowel movements). Pt remains with NGT for decompression and NPO. Pt has been without adequate nutrition (NPO/clear liquids) for 8 days, due to post-operative ileus.   Case discussed with Dr. Tawanna Solo, Dr. Cristina Gong, and Dr. Therisa Doyne via secure chat.Pt passed gas today. If x-ray shows no improvement tomorrow, plan for CAT scan of abdomen with oral contrast. Plan to start TPN today.   Medications reviewed and include colace, folic acid, MVI, senna, vitamin B-12, and potassium chloride.   Labs reviewed: Na: 131.   Diet Order:   Diet Order            Diet NPO time specified Except for: Ice  Chips  Diet effective now              EDUCATION NEEDS:   Education needs have been addressed  Skin:  Skin Assessment: Skin Integrity Issues: Skin Integrity Issues:: Incisions Incisions: rt hip  Last BM:  04/29/19  Height:   Ht Readings from Last 1 Encounters:  04/12/19 5\' 8"  (1.727 m)    Weight:   Wt Readings from Last 1 Encounters:  04/24/19 72.8 kg    Ideal Body Weight:  70 kg  BMI:  Body mass index is 24.4 kg/m.  Estimated Nutritional Needs:   Kcal:  2200-2400  Protein:  110-125 grams  Fluid:  > 2.2 L    Loistine Chance, RD, LDN, [place] Registered Dietitian II Certified Diabetes Care and Education Specialist Please refer to [facility] for RD and/or RD on-call/weekend/after hours pager

## 2019-05-02 NOTE — Progress Notes (Signed)
Subjective: The patient was seen and examined at bedside. Patient states that he has been passing flatulence. He has an NG tube that is not connected to wall suction current. He is sitting up on a bedside chair and is requesting for his diet to be advanced.  Objective: Vital signs in last 24 hours: Temp:  [98.5 F (36.9 C)-100.5 F (38.1 C)] 99.2 F (37.3 C) (03/08 0440) Pulse Rate:  [95-100] 98 (03/08 0440) Resp:  [18] 18 (03/08 0440) BP: (114-128)/(80-89) 128/89 (03/08 0440) SpO2:  [98 %-99 %] 98 % (03/08 0440) Weight change:  Last BM Date: 04/29/19  PE: Thinly built, frail GENERAL: Mild pallor without icterus, moist mucous membranes, NG tube in place without connection to wall suction ABDOMEN: Soft, minimal distention, sluggish hypoactive bowel sounds EXTREMITIES: No deformity  Lab Results: Results for orders placed or performed during the hospital encounter of 04/12/19 (from the past 48 hour(s))  Basic metabolic panel     Status: Abnormal   Collection Time: 05/01/19  2:43 AM  Result Value Ref Range   Sodium 132 (L) 135 - 145 mmol/L   Potassium 4.0 3.5 - 5.1 mmol/L   Chloride 103 98 - 111 mmol/L   CO2 19 (L) 22 - 32 mmol/L   Glucose, Bld 85 70 - 99 mg/dL    Comment: Glucose reference range applies only to samples taken after fasting for at least 8 hours.   BUN <5 (L) 8 - 23 mg/dL   Creatinine, Ser 0.62 0.61 - 1.24 mg/dL   Calcium 8.5 (L) 8.9 - 10.3 mg/dL   GFR calc non Af Amer >60 >60 mL/min   GFR calc Af Amer >60 >60 mL/min   Anion gap 10 5 - 15    Comment: Performed at [facility] [address] [place], [place] [address]  Basic metabolic panel     Status: Abnormal   Collection Time: 05/02/19  2:05 AM  Result Value Ref Range   Sodium 131 (L) 135 - 145 mmol/L   Potassium 4.1 3.5 - 5.1 mmol/L   Chloride 102 98 - 111 mmol/L   CO2 20 (L) 22 - 32 mmol/L   Glucose, Bld 100 (H) 70 - 99 mg/dL    Comment: Glucose reference range applies only to samples taken  after fasting for at least 8 hours.   BUN <5 (L) 8 - 23 mg/dL   Creatinine, Ser 0.55 (L) 0.61 - 1.24 mg/dL   Calcium 8.8 (L) 8.9 - 10.3 mg/dL   GFR calc non Af Amer >60 >60 mL/min   GFR calc Af Amer >60 >60 mL/min   Anion gap 9 5 - 15    Comment: Performed at [facility] [address], [place], [place] [address]    Studies/Results: DG Abd 1 View  Result Date: 05/02/2019 CLINICAL DATA:  Postop ileus, distention EXAM: ABDOMEN - 1 VIEW COMPARISON:  05/01/2019 FINDINGS: Continued gaseous distention of bowel. NG tube partially imaged in the stomach. No organomegaly or free air. IMPRESSION: Continued gaseous distention of bowel, unchanged. This could reflect ileus or small-bowel obstruction. Electronically Signed   By: Rolm Baptise M.D.   On: 05/02/2019 08:55   DG Abd Portable 1V  Result Date: 05/01/2019 CLINICAL DATA:  Ileus EXAM: PORTABLE ABDOMEN - 1 VIEW COMPARISON:  04/30/2019 FINDINGS: Air-filled loops of small bowel are again identified. Degree of distention is overall similar. There is increased distension in the right lower quadrant in the region of the ileocecal junction. IMPRESSION: Persistent  small bowel dilatation. Increased distension of the right lower quadrant but otherwise overall similar. Electronically Signed   By: Macy Mis M.D.   On: 05/01/2019 08:22   DG Abd Portable 1V  Result Date: 04/30/2019 CLINICAL DATA:  Verify NG placement EXAM: PORTABLE ABDOMEN - 1 VIEW COMPARISON:  Radiograph 04/30/2019 FINDINGS: Interval placement of an transesophageal tube which appears curled within the gastric lumen terminating left upper quadrant is persistent air distended loops of small bowel stacked within the mid abdomen. Limited evaluation for intraperitoneal free air on a supine image. Multilevel degenerative changes noted in the spine as well as additional degenerative changes in the SI joints. Right femoral intramedullary nail and cannulated transcervical fixation screws are noted.  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 IMPRESSION: Transesophageal tube appears curled within the gastric lumen. Persistent small bowel dilatation, concerning for persistent obstruction/ileus. Electronically Signed   By: Lovena Le M.D.   On: 04/30/2019 16:18   DG FEMUR, MIN 2 VIEWS RIGHT  Result Date: 05/02/2019 CLINICAL DATA:  Pain.  Surgery 04/14/2019 EXAM: RIGHT FEMUR 2 VIEWS COMPARISON:  04/14/2019, 04/12/2019 FINDINGS: Internal fixation across the right femoral intertrochanteric fracture. Stable appearance when compared to postoperative imaging from 04/14/2019. No hardware complicating feature. No subluxation or dislocation. IMPRESSION: Internal fixation across the the right femoral intertrochanteric fracture. No significant change since prior imaging. Electronically Signed   By: Rolm Baptise M.D.   On: 05/02/2019 08:58    Medications: I have reviewed the patient's current medications.  Assessment: Postop ileus-abdominal x-ray today showed continued gaseous distention of bowel, unchanged, which could reflect ileus or small bowel obstruction  Potassium 4.1 today Decreasing leukocytosis, WBC 13.8 Decreasing thrombocytosis, platelet 482   Plan: Discussed with Dr. Tawanna Solo, patient's hospitalist to change oral potassium to IV potassium.   Plan to keep potassium at least around 4.5.  Discussed about starting IV TPN Okay to start clear liquid diet if tolerated Repeat abdominal x-ray in a.m. Currently on D5 half-normal saline with 20 M EQ KCl at 75 mL/h Patient also receiving erythromycin 250 mg IV every 12 hours Encouraged patient regarding mobilization, advised to walk around the hallways several times a day. If abdominal x-ray tomorrow shows no improvement, recommend getting a CAT scan of the abdomen with oral contrast via NG tube. If abdominal x-ray tomorrow shows improved, then diet can be advanced accordingly.   Ronnette Juniper,  MD 05/02/2019, 9:32 AM

## 2019-05-02 NOTE — Progress Notes (Signed)
PT Cancellation Note  Patient Details Name: Duane Richardson MRN: [id number] DOB: [birth year]   Cancelled Treatment:    Reason Eval/Treat Not Completed: Fatigue/lethargy limiting ability to participate - Pt states he mobilized this morning to chair and back prior to PT arrival, states he is too fatigued to engage in PT session at present. PT to check back as schedule allows.  [place] Pager [phone]  Office [phone]    [place] 05/02/2019, 1:21 PM
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
## 2019-05-03 ENCOUNTER — Inpatient Hospital Stay (HOSPITAL_COMMUNITY): Payer: Medicare Other

## 2019-05-03 DIAGNOSIS — M25551 Pain in right hip: Secondary | ICD-10-CM | POA: Diagnosis not present

## 2019-05-03 DIAGNOSIS — S72141A Displaced intertrochanteric fracture of right femur, initial encounter for closed fracture: Secondary | ICD-10-CM | POA: Diagnosis not present

## 2019-05-03 DIAGNOSIS — S728X1A Other fracture of right femur, initial encounter for closed fracture: Secondary | ICD-10-CM | POA: Diagnosis not present

## 2019-05-03 LAB — MAGNESIUM: Magnesium: 1.7 mg/dL (ref 1.7–2.4)

## 2019-05-03 LAB — CBC
HCT: 34.5 % — ABNORMAL LOW (ref 39.0–52.0)
Hemoglobin: 11.2 g/dL — ABNORMAL LOW (ref 13.0–17.0)
MCH: 30.2 pg (ref 26.0–34.0)
MCHC: 32.5 g/dL (ref 30.0–36.0)
MCV: 93 fL (ref 80.0–100.0)
Platelets: 438 10*3/uL — ABNORMAL HIGH (ref 150–400)
RBC: 3.71 MIL/uL — ABNORMAL LOW (ref 4.22–5.81)
RDW: 13.1 % (ref 11.5–15.5)
WBC: 15.2 10*3/uL — ABNORMAL HIGH (ref 4.0–10.5)
nRBC: 0 % (ref 0.0–0.2)

## 2019-05-03 LAB — COMPREHENSIVE METABOLIC PANEL
ALT: 10 U/L (ref 0–44)
AST: 16 U/L (ref 15–41)
Albumin: 2.7 g/dL — ABNORMAL LOW (ref 3.5–5.0)
Alkaline Phosphatase: 86 U/L (ref 38–126)
Anion gap: 11 (ref 5–15)
BUN: 6 mg/dL — ABNORMAL LOW (ref 8–23)
CO2: 20 mmol/L — ABNORMAL LOW (ref 22–32)
Calcium: 9 mg/dL (ref 8.9–10.3)
Chloride: 100 mmol/L (ref 98–111)
Creatinine, Ser: 0.64 mg/dL (ref 0.61–1.24)
GFR calc Af Amer: >60 mL/min (ref >60)
GFR calc non Af Amer: >60 mL/min (ref >60)
Glucose, Bld: 134 mg/dL — ABNORMAL HIGH (ref 70–99)
Potassium: 4.6 mmol/L (ref 3.5–5.1)
Sodium: 131 mmol/L — ABNORMAL LOW (ref 135–145)
Total Bilirubin: 1 mg/dL (ref 0.3–1.2)
Total Protein: 6.5 g/dL (ref 6.5–8.1)

## 2019-05-03 LAB — GLUCOSE, CAPILLARY
Glucose-Capillary: 141 mg/dL — ABNORMAL HIGH (ref 70–99)
Glucose-Capillary: 132 mg/dL — ABNORMAL HIGH (ref 70–99)
Glucose-Capillary: 138 mg/dL — ABNORMAL HIGH (ref 70–99)

## 2019-05-03 LAB — DIFFERENTIAL
Abs Immature Granulocytes: 0.25 10*3/uL — ABNORMAL HIGH (ref 0.00–0.07)
Basophils Absolute: 0.1 10*3/uL (ref 0.0–0.1)
Basophils Relative: 1 %
Eosinophils Absolute: 0.1 10*3/uL (ref 0.0–0.5)
Eosinophils Relative: 1 %
Immature Granulocytes: 2 %
Lymphocytes Relative: 11 %
Lymphs Abs: 1.7 10*3/uL (ref 0.7–4.0)
Monocytes Absolute: 2.4 10*3/uL — ABNORMAL HIGH (ref 0.1–1.0)
Monocytes Relative: 15 %
Neutro Abs: 10.7 10*3/uL — ABNORMAL HIGH (ref 1.7–7.7)
Neutrophils Relative %: 70 %

## 2019-05-03 LAB — PHOSPHORUS: Phosphorus: 3.6 mg/dL (ref 2.5–4.6)

## 2019-05-03 LAB — PREALBUMIN: Prealbumin: 6.5 mg/dL — ABNORMAL LOW (ref 18–38)

## 2019-05-03 MED ORDER — FOLIC ACID 1 MG PO TABS
1.0000 mg | ORAL_TABLET | Freq: Every day | ORAL | Status: DC
  Administered 2019-05-04 – 2019-05-05 (×2): 1 mg via ORAL
  Filled 2019-05-03 (×2): qty 1

## 2019-05-03 MED ORDER — THIAMINE HCL 100 MG PO TABS
100.0000 mg | ORAL_TABLET | Freq: Every day | ORAL | Status: DC
  Administered 2019-05-04 – 2019-05-05 (×2): 100 mg via ORAL
  Filled 2019-05-03 (×2): qty 1

## 2019-05-03 MED ORDER — TRAVASOL 10 % IV SOLN
INTRAVENOUS | Status: AC
  Filled 2019-05-03: qty 900

## 2019-05-03 MED ORDER — ADULT MULTIVITAMIN W/MINERALS CH
1.0000 | ORAL_TABLET | Freq: Every day | ORAL | Status: DC
  Administered 2019-05-04 – 2019-05-05 (×2): 1 via ORAL
  Filled 2019-05-03 (×2): qty 1

## 2019-05-03 MED ORDER — MAGNESIUM SULFATE 2 GM/50ML IV SOLN
2.0000 g | Freq: Once | INTRAVENOUS | Status: AC
  Administered 2019-05-03: 2 g via INTRAVENOUS
  Filled 2019-05-03: qty 50

## 2019-05-03 NOTE — Progress Notes (Signed)
Subjective: Patient states he had 3 loose bowel movements post midnight today. He is frustrated about prolonged hospitalization and is requesting for solid food.  Objective: Vital signs in last 24 hours: Temp:  [97.8 F (36.6 C)-99 F (37.2 C)] 97.8 F (36.6 C) (03/09 0547) Pulse Rate:  [96-99] 99 (03/09 0547) Resp:  [18-20] 18 (03/09 0547) BP: (115-128)/(79-84) 116/79 (03/09 0547) SpO2:  [98 %-100 %] 98 % (03/09 0547) Weight change:  Last BM Date: 05/03/19  PE: Thin, cachectic, not in distress, NG tube is currently clamped GENERAL: No pallor, no icterus ABDOMEN: Slightly distended, normal active bowel sounds, nontender EXTREMITIES: No edema  Lab Results: Results for orders placed or performed during the hospital encounter of 04/12/19 (from the past 48 hour(s))  Basic metabolic panel     Status: Abnormal   Collection Time: 05/02/19  2:05 AM  Result Value Ref Range   Sodium 131 (L) 135 - 145 mmol/L   Potassium 4.1 3.5 - 5.1 mmol/L   Chloride 102 98 - 111 mmol/L   CO2 20 (L) 22 - 32 mmol/L   Glucose, Bld 100 (H) 70 - 99 mg/dL    Comment: Glucose reference range applies only to samples taken after fasting for at least 8 hours.   BUN <5 (L) 8 - 23 mg/dL   Creatinine, Ser 0.55 (L) 0.61 - 1.24 mg/dL   Calcium 8.8 (L) 8.9 - 10.3 mg/dL   GFR calc non Af Amer >60 >60 mL/min   GFR calc Af Amer >60 >60 mL/min   Anion gap 9 5 - 15    Comment: Performed at [facility] [address], [place], [place] [address]  Phosphorus     Status: None   Collection Time: 05/02/19  2:05 AM  Result Value Ref Range   Phosphorus 3.8 2.5 - 4.6 mg/dL    Comment: Performed at [facility] [address] [place], [place] [address]  Magnesium     Status: None   Collection Time: 05/02/19  2:05 AM  Result Value Ref Range   Magnesium 1.7 1.7 - 2.4 mg/dL    Comment: Performed at [facility] [address] [place], [place] [address]  Triglycerides     Status: None   Collection  Time: 05/02/19  2:05 AM  Result Value Ref Range   Triglycerides 115 <150 mg/dL    Comment: Performed at [facility] [address] [place], [place] [address]  Glucose, capillary     Status: None   Collection Time: 05/02/19  4:04 PM  Result Value Ref Range   Glucose-Capillary 93 70 - 99 mg/dL    Comment: Glucose reference range applies only to samples taken after fasting for at least 8 hours.  Glucose, capillary     Status: Abnormal   Collection Time: 05/02/19  8:27 PM  Result Value Ref Range   Glucose-Capillary 105 (H) 70 - 99 mg/dL    Comment: Glucose reference range applies only to samples taken after fasting for at least 8 hours.  Comprehensive metabolic panel     Status: Abnormal   Collection Time: 05/03/19  4:23 AM  Result Value Ref Range   Sodium 131 (L) 135 - 145 mmol/L   Potassium 4.6 3.5 - 5.1 mmol/L   Chloride 100 98 - 111 mmol/L   CO2 20 (L) 22 - 32 mmol/L   Glucose, Bld 134 (H) 70 - 99 mg/dL    Comment: Glucose reference range applies only to  samples taken after fasting for at least 8 hours.   BUN 6 (L) 8 - 23 mg/dL   Creatinine, Ser 0.64 0.61 - 1.24 mg/dL   Calcium 9.0 8.9 - 10.3 mg/dL   Total Protein 6.5 6.5 - 8.1 g/dL   Albumin 2.7 (L) 3.5 - 5.0 g/dL   AST 16 15 - 41 U/L   ALT 10 0 - 44 U/L   Alkaline Phosphatase 86 38 - 126 U/L   Total Bilirubin 1.0 0.3 - 1.2 mg/dL   GFR calc non Af Amer >60 >60 mL/min   GFR calc Af Amer >60 >60 mL/min   Anion gap 11 5 - 15    Comment: Performed at [facility], [place] [address], [place], [place] [address]  Prealbumin     Status: Abnormal   Collection Time: 05/03/19  4:23 AM  Result Value Ref Range   Prealbumin 6.5 (L) 18 - 38 mg/dL    Comment: Performed at [facility] [address], [place], [place] [address]  Magnesium     Status: None   Collection Time: 05/03/19  4:23 AM  Result Value Ref Range   Magnesium 1.7 1.7 - 2.4 mg/dL    Comment: Performed at [facility] [address]  [place], [place] [address]  Phosphorus     Status: None   Collection Time: 05/03/19  4:23 AM  Result Value Ref Range   Phosphorus 3.6 2.5 - 4.6 mg/dL    Comment: Performed at [facility] [address], [place], [place] [address]  CBC     Status: Abnormal   Collection Time: 05/03/19  4:23 AM  Result Value Ref Range   WBC 15.2 (H) 4.0 - 10.5 K/uL   RBC 3.71 (L) 4.22 - 5.81 MIL/uL   Hemoglobin 11.2 (L) 13.0 - 17.0 g/dL   HCT 34.5 (L) 39.0 - 52.0 %   MCV 93.0 80.0 - 100.0 fL   MCH 30.2 26.0 - 34.0 pg   MCHC 32.5 30.0 - 36.0 g/dL   RDW 13.1 11.5 - 15.5 %   Platelets 438 (H) 150 - 400 K/uL   nRBC 0.0 0.0 - 0.2 %    Comment: Performed at [facility], [place] [address], [place],  [address]  Differential     Status: Abnormal   Collection Time: 05/03/19  4:23 AM  Result Value Ref Range   Neutrophils Relative % 70 %   Neutro Abs 10.7 (H) 1.7 - 7.7 K/uL   Lymphocytes Relative 11 %   Lymphs Abs 1.7 0.7 - 4.0 K/uL   Monocytes Relative 15 %   Monocytes Absolute 2.4 (H) 0.1 - 1.0 K/uL   Eosinophils Relative 1 %   Eosinophils Absolute 0.1 0.0 - 0.5 K/uL   Basophils Relative 1 %   Basophils Absolute 0.1 0.0 - 0.1 K/uL   Immature Granulocytes 2 %   Abs Immature Granulocytes 0.25 (H) 0.00 - 0.07 K/uL    Comment: Performed at [facility] [address], [place], [place] [address]  Glucose, capillary     Status: Abnormal   Collection Time: 05/03/19  5:44 AM  Result Value Ref Range   Glucose-Capillary 141 (H) 70 - 99 mg/dL    Comment: Glucose reference range applies only to samples taken after fasting for at least 8 hours.    Studies/Results: DG Abd 1 View  Result Date: 05/02/2019 CLINICAL DATA:  Postop ileus, distention EXAM: ABDOMEN - 1 VIEW  COMPARISON:  05/01/2019 FINDINGS: Continued gaseous distention of bowel. NG tube partially imaged in the stomach. No organomegaly or free air. IMPRESSION: Continued gaseous distention of bowel, unchanged. This could reflect ileus or  small-bowel obstruction. Electronically Signed   By: Rolm Baptise M.D.   On: 05/02/2019 08:55   DG FEMUR, MIN 2 VIEWS RIGHT  Result Date: 05/02/2019 CLINICAL DATA:  Pain.  Surgery 04/14/2019 EXAM: RIGHT FEMUR 2 VIEWS COMPARISON:  04/14/2019, 04/12/2019 FINDINGS: Internal fixation across the right femoral intertrochanteric fracture. Stable appearance when compared to postoperative imaging from 04/14/2019. No hardware complicating feature. No subluxation or dislocation. IMPRESSION: Internal fixation across the the right femoral intertrochanteric fracture. No significant change since prior imaging. Electronically Signed   By: Rolm Baptise M.D.   On: 05/02/2019 08:58   [place] EKG SITE RITE  Result Date: 05/02/2019 If Site Rite image not attached, placement could not be confirmed due to current cardiac rhythm.   Medications: I have reviewed the patient's current medications.  Assessment: Ileus-abdominal x-ray performed today, official results pending, images obtained show persistent gaseous dilatation No nausea, vomiting, 3 bowel movements since midnight as per patient Potassium 4.6 today, magnesium 1.7 Mild leukocytosis, improving thrombocytosis Low albumin 2.7, patient has been started on IV TPN from yesterday 600 cc of bilious output in the canister in the last 24 hours  Plan: Clear liquid diet, advance to full liquid diet for today afternoon if patient tolerates. Continue erythromycin IV 250 mg every 12 hours, continue mobilization, minimize use of narcotics, continue MiraLAX 17 g twice daily, continue senna 1 tablet twice daily Abdominal x-ray in a.m. Keep magnesium above 2  Ronnette Juniper, MD 05/03/2019, 7:58 AM
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
## 2019-05-03 NOTE — Plan of Care (Signed)
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
## 2019-05-03 NOTE — TOC Progression Note (Signed)
Transition of Care [facility]) - Progression Note    Patient Details  Name: Duane Richardson MRN: [id number] Date of Birth: [birth year]  Transition of Care [facility]) CM/SW [place], [place] Phone Number: 05/03/2019, 11:09 AM  Clinical Narrative:    TOC team continues to follow for dc to SNF when ready. Barriers include TPN and ng tube still to suction.    Expected Discharge Plan: [place] Barriers to Discharge: Continued Medical Work up(ng tube to suction)  Expected Discharge Plan and Services Expected Discharge Plan: [place] In-house Referral: Clinical Social Work Discharge Planning Services: CM Consult Post Acute Care Choice: [place], Durable Medical Equipment Living arrangements for the past 2 months: [place]   Readmission Risk Interventions Readmission Risk Prevention Plan 04/20/2019  Transportation Screening Complete  PCP or Specialist Appt within 5-7 Days Not Complete  Not Complete comments plan for SNF  Home Care Screening Complete  Medication Review (RN CM) Referral to Pharmacy  Some recent data might be hidden

## 2019-05-04 ENCOUNTER — Inpatient Hospital Stay (HOSPITAL_COMMUNITY): Payer: Medicare Other

## 2019-05-04 DIAGNOSIS — S728X1A Other fracture of right femur, initial encounter for closed fracture: Secondary | ICD-10-CM | POA: Diagnosis not present

## 2019-05-04 DIAGNOSIS — M25551 Pain in right hip: Secondary | ICD-10-CM | POA: Diagnosis not present

## 2019-05-04 DIAGNOSIS — S72141A Displaced intertrochanteric fracture of right femur, initial encounter for closed fracture: Secondary | ICD-10-CM | POA: Diagnosis not present

## 2019-05-04 LAB — BASIC METABOLIC PANEL
Anion gap: 10 (ref 5–15)
BUN: 10 mg/dL (ref 8–23)
CO2: 23 mmol/L (ref 22–32)
Calcium: 9.2 mg/dL (ref 8.9–10.3)
Chloride: 99 mmol/L (ref 98–111)
Creatinine, Ser: 0.49 mg/dL — ABNORMAL LOW (ref 0.61–1.24)
GFR calc Af Amer: >60 mL/min (ref >60)
GFR calc non Af Amer: >60 mL/min (ref >60)
Glucose, Bld: 111 mg/dL — ABNORMAL HIGH (ref 70–99)
Potassium: 4.7 mmol/L (ref 3.5–5.1)
Sodium: 132 mmol/L — ABNORMAL LOW (ref 135–145)

## 2019-05-04 LAB — GLUCOSE, CAPILLARY
Glucose-Capillary: 115 mg/dL — ABNORMAL HIGH (ref 70–99)
Glucose-Capillary: 125 mg/dL — ABNORMAL HIGH (ref 70–99)
Glucose-Capillary: 125 mg/dL — ABNORMAL HIGH (ref 70–99)
Glucose-Capillary: 129 mg/dL — ABNORMAL HIGH (ref 70–99)

## 2019-05-04 LAB — CBC WITH DIFFERENTIAL/PLATELET
Abs Immature Granulocytes: 0.19 10*3/uL — ABNORMAL HIGH (ref 0.00–0.07)
Basophils Absolute: 0.1 10*3/uL (ref 0.0–0.1)
Basophils Relative: 1 %
Eosinophils Absolute: 0.2 10*3/uL (ref 0.0–0.5)
Eosinophils Relative: 1 %
HCT: 35.7 % — ABNORMAL LOW (ref 39.0–52.0)
Hemoglobin: 11.8 g/dL — ABNORMAL LOW (ref 13.0–17.0)
Immature Granulocytes: 1 %
Lymphocytes Relative: 12 %
Lymphs Abs: 1.8 10*3/uL (ref 0.7–4.0)
MCH: 30.5 pg (ref 26.0–34.0)
MCHC: 33.1 g/dL (ref 30.0–36.0)
MCV: 92.2 fL (ref 80.0–100.0)
Monocytes Absolute: 2.2 10*3/uL — ABNORMAL HIGH (ref 0.1–1.0)
Monocytes Relative: 15 %
Neutro Abs: 10.5 10*3/uL — ABNORMAL HIGH (ref 1.7–7.7)
Neutrophils Relative %: 70 %
Platelets: 421 10*3/uL — ABNORMAL HIGH (ref 150–400)
RBC: 3.87 MIL/uL — ABNORMAL LOW (ref 4.22–5.81)
RDW: 13 % (ref 11.5–15.5)
WBC: 15 10*3/uL — ABNORMAL HIGH (ref 4.0–10.5)
nRBC: 0 % (ref 0.0–0.2)

## 2019-05-04 LAB — PHOSPHORUS: Phosphorus: 4.3 mg/dL (ref 2.5–4.6)

## 2019-05-04 LAB — MAGNESIUM: Magnesium: 1.8 mg/dL (ref 1.7–2.4)

## 2019-05-04 MED ORDER — MAGNESIUM SULFATE 2 GM/50ML IV SOLN
2.0000 g | Freq: Once | INTRAVENOUS | Status: AC
  Administered 2019-05-04: 2 g via INTRAVENOUS
  Filled 2019-05-04: qty 50

## 2019-05-04 MED ORDER — INSULIN ASPART 100 UNIT/ML ~~LOC~~ SOLN
0.0000 [IU] | Freq: Three times a day (TID) | SUBCUTANEOUS | Status: DC
  Administered 2019-05-05: 1 [IU] via SUBCUTANEOUS

## 2019-05-04 MED ORDER — TRAVASOL 10 % IV SOLN
INTRAVENOUS | Status: DC
  Filled 2019-05-04: qty 1140
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
## 2019-05-04 NOTE — Care Management (Signed)
Ordered 3 in 1 with [facility] phone [phone] fax [phone] , spoke with [place] . [employer] information , order and H and P.  They will call patient for delivery.   Entered OP PT order.   Magdalen Spatz RN

## 2019-05-04 NOTE — Plan of Care (Signed)
  Problem: Education: Goal: Knowledge of General Education information will improve Description Including pain rating scale, medication(s)/side effects and non-pharmacologic comfort measures Outcome: Progressing   

## 2019-05-04 NOTE — TOC Progression Note (Addendum)
Transition of Care [facility]) - Progression Note    Patient Details  Name: Duane Richardson MRN: [id number] Date of Birth: 1956/02/24  Transition of [facility]) CM/SW [place], [place] Phone Number: 05/04/2019, 2:22 PM  Clinical Narrative:    CSW spoke with Elder Negus, [place] from [place] [phone]). She is agreeable for pt to return home. She confirmed that pt brother will be with him, and she will ensure pt can get to OPPT after we discussed that home health is very difficult to arrange for Medicaid only pts at home. She confirms pt has a cane and motorized wheelchair at home. She is not sure about a walker but pt had stated that he has one. She requests a 3-n-1, we will contact [employer]. Request sent out for OP PT, preference for [facility].   Per [place] Flow, pt will require PTAR transport home. PCP appointment made for 3/23 at 10:30am, added to f/u providers.  Expected Discharge Plan: OP Rehab Barriers to Discharge: Continued Medical Work up  Expected Discharge Plan and Services Expected Discharge Plan: OP Rehab In-house Referral: Clinical Social Work Discharge Planning Services: CM Consult Post Acute Care Choice: Durable Medical Equipment Living arrangements for the past 2 months: Single Family Home           DME Arranged: 3-N-1 DME Agency: [place] Date DME Agency Contacted: 05/04/19  Readmission Risk Interventions Readmission Risk Prevention Plan 04/20/2019  Transportation Screening Complete  PCP or Specialist Appt within 5-7 Days Not Complete  Not Complete comments plan for SNF  Home Care Screening Complete  Medication Review (RN CM) Referral to Pharmacy  Some recent data might be hidden
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
## 2019-05-04 NOTE — TOC Progression Note (Signed)
Transition of Care [facility]) - Progression Note    Patient Details  Name: Duane Richardson MRN: [id number] Date of Birth: June 10, 1955  Transition of Care [facility]) CM/SW [place], [place] Phone Number: 05/04/2019, 11:51 AM  Clinical Narrative:    CSW spoke with pt at bedside. Reintroduced self, role, reason for visit. Pt tearful stating he just wants to go home. He states that his brother is with him at home and he wants home health. CSW explained that [facility] is very difficult for pts to get at home with only Medicaid. We discussed safety at home, and this writer explained that it is documented he only walked about 30 ft, pt tells this Probation officer "I have walked twice every day with therapy. My brother will be there with me everyday." Pt provided this Probation officer with a number for Karolee Ohs and states she provides [facility] and again told this Probation officer of his desire to d/c home. CSW received permission to speak with Lelon Frohlich and pt CSW [place] Flow through [place].   CSW called and spoke with Karolee Ohs, Lelon Frohlich is from [facility] in [place]. Her company provides PCA services. She has submitted paperwork to pt PCP but they have not started services yet and do not provide PT.  CSW will reach out to [place] with DSS regarding transport to outpatient PT as pt states she is his transportation.   Expected Discharge Plan: [place] Barriers to Discharge: Continued Medical Work up(ng tube to suction)  Expected Discharge Plan and Services Expected Discharge Plan: [place] In-house Referral: Clinical Social Work Discharge Planning Services: CM Consult Post Acute Care Choice: [place], Durable Medical Equipment Living arrangements for the past 2 months: [place]  Readmission Risk Interventions Readmission Risk Prevention Plan 04/20/2019  Transportation Screening Complete  PCP or Specialist Appt within 5-7 Days Not Complete  Not Complete comments plan  for SNF  Home Care Screening Complete  Medication Review (RN CM) Referral to Pharmacy  Some recent data might be hidden

## 2019-05-04 NOTE — Progress Notes (Signed)
Subjective: Patient states that he had liquid loose bowel movement at 3 AM today, was having 3 bowel movements yesterday. Was on clear liquid diet yesterday morning advance to full liquid diet yesterday evening onwards. Denies abdominal pain.  Objective: Vital signs in last 24 hours: Temp:  [97.9 F (36.6 C)-98.6 F (37 C)] 98.2 F (36.8 C) (03/10 0541) Pulse Rate:  [88-97] 94 (03/10 0541) Resp:  [17-18] 18 (03/10 0541) BP: (115-120)/(74-85) 119/74 (03/10 0541) SpO2:  [98 %-100 %] 99 % (03/10 0541) Weight change:  Last BM Date: 05/03/19  PE: Thinly built, NG tube in place, not connected to wall suction GENERAL: Mild pallor without icterus ABDOMEN: Slightly distended, nontender, hypoactive but audible bowel sounds EXTREMITIES: No deformity  Lab Results: Results for orders placed or performed during the hospital encounter of 04/12/19 (from the past 48 hour(s))  Glucose, capillary     Status: None   Collection Time: 05/02/19  4:04 PM  Result Value Ref Range   Glucose-Capillary 93 70 - 99 mg/dL    Comment: Glucose reference range applies only to samples taken after fasting for at least 8 hours.  Glucose, capillary     Status: Abnormal   Collection Time: 05/02/19  8:27 PM  Result Value Ref Range   Glucose-Capillary 105 (H) 70 - 99 mg/dL    Comment: Glucose reference range applies only to samples taken after fasting for at least 8 hours.  Comprehensive metabolic panel     Status: Abnormal   Collection Time: 05/03/19  4:23 AM  Result Value Ref Range   Sodium 131 (L) 135 - 145 mmol/L   Potassium 4.6 3.5 - 5.1 mmol/L   Chloride 100 98 - 111 mmol/L   CO2 20 (L) 22 - 32 mmol/L   Glucose, Bld 134 (H) 70 - 99 mg/dL    Comment: Glucose reference range applies only to samples taken after fasting for at least 8 hours.   BUN 6 (L) 8 - 23 mg/dL   Creatinine, Ser 0.64 0.61 - 1.24 mg/dL   Calcium 9.0 8.9 - 10.3 mg/dL   Total Protein 6.5 6.5 - 8.1 g/dL   Albumin 2.7 (L) 3.5 - 5.0 g/dL    AST 16 15 - 41 U/L   ALT 10 0 - 44 U/L   Alkaline Phosphatase 86 38 - 126 U/L   Total Bilirubin 1.0 0.3 - 1.2 mg/dL   GFR calc non Af Amer >60 >60 mL/min   GFR calc Af Amer >60 >60 mL/min   Anion gap 11 5 - 15    Comment: Performed at [facility], [place] [address] [place], [place] [address]  Prealbumin     Status: Abnormal   Collection Time: 05/03/19  4:23 AM  Result Value Ref Range   Prealbumin 6.5 (L) 18 - 38 mg/dL    Comment: Performed at [facility] [address] [place], [place] [address]  Magnesium     Status: None   Collection Time: 05/03/19  4:23 AM  Result Value Ref Range   Magnesium 1.7 1.7 - 2.4 mg/dL    Comment: Performed at [facility] [address] [place], [place] [address]  Phosphorus     Status: None   Collection Time: 05/03/19  4:23 AM  Result Value Ref Range   Phosphorus 3.6 2.5 - 4.6 mg/dL    Comment: Performed at [facility] [address] [place], [place] [address]  CBC     Status: Abnormal  Collection Time: 05/03/19  4:23 AM  Result Value Ref Range   WBC 15.2 (H) 4.0 - 10.5 K/uL   RBC 3.71 (L) 4.22 - 5.81 MIL/uL   Hemoglobin 11.2 (L) 13.0 - 17.0 g/dL   HCT 34.5 (L) 39.0 - 52.0 %   MCV 93.0 80.0 - 100.0 fL   MCH 30.2 26.0 - 34.0 pg   MCHC 32.5 30.0 - 36.0 g/dL   RDW 13.1 11.5 - 15.5 %   Platelets 438 (H) 150 - 400 K/uL   nRBC 0.0 0.0 - 0.2 %    Comment: Performed at [facility] [address] [place], [place] [address]  Differential     Status: Abnormal   Collection Time: 05/03/19  4:23 AM  Result Value Ref Range   Neutrophils Relative % 70 %   Neutro Abs 10.7 (H) 1.7 - 7.7 K/uL   Lymphocytes Relative 11 %   Lymphs Abs 1.7 0.7 - 4.0 K/uL   Monocytes Relative 15 %   Monocytes Absolute 2.4 (H) 0.1 - 1.0 K/uL   Eosinophils Relative 1 %   Eosinophils Absolute 0.1 0.0 - 0.5 K/uL   Basophils Relative 1 %   Basophils Absolute 0.1 0.0 - 0.1 K/uL   Immature Granulocytes 2 %   Abs Immature Granulocytes 0.25  (H) 0.00 - 0.07 K/uL    Comment: Performed at [facility] [address], [place], [place] [address]  Glucose, capillary     Status: Abnormal   Collection Time: 05/03/19  5:44 AM  Result Value Ref Range   Glucose-Capillary 141 (H) 70 - 99 mg/dL    Comment: Glucose reference range applies only to samples taken after fasting for at least 8 hours.  Glucose, capillary     Status: Abnormal   Collection Time: 05/03/19 12:04 PM  Result Value Ref Range   Glucose-Capillary 132 (H) 70 - 99 mg/dL    Comment: Glucose reference range applies only to samples taken after fasting for at least 8 hours.  Glucose, capillary     Status: Abnormal   Collection Time: 05/03/19  6:12 PM  Result Value Ref Range   Glucose-Capillary 138 (H) 70 - 99 mg/dL    Comment: Glucose reference range applies only to samples taken after fasting for at least 8 hours.  Glucose, capillary     Status: Abnormal   Collection Time: 05/04/19 12:26 AM  Result Value Ref Range   Glucose-Capillary 129 (H) 70 - 99 mg/dL    Comment: Glucose reference range applies only to samples taken after fasting for at least 8 hours.  Glucose, capillary     Status: Abnormal   Collection Time: 05/04/19  5:50 AM  Result Value Ref Range   Glucose-Capillary 115 (H) 70 - 99 mg/dL    Comment: Glucose reference range applies only to samples taken after fasting for at least 8 hours.    Studies/Results: DG Abd 1 View  Result Date: 05/03/2019 CLINICAL DATA:  Ileus. EXAM: ABDOMEN - 1 VIEW COMPARISON:  May 02, 2019. FINDINGS: Stable dilated small bowel loops are noted concerning for ileus or distal small bowel obstruction. Nasogastric tube is seen within the visualized stomach. No radio-opaque calculi or other significant radiographic abnormality are seen. IMPRESSION: Stable dilated small bowel loops are noted concerning for ileus or distal small bowel obstruction. Electronically Signed   By: Marijo Conception M.D.   On: 05/03/2019 09:07   DG Abd 1  View  Result Date: 05/02/2019 CLINICAL DATA:  Postop ileus, distention EXAM: ABDOMEN - 1 VIEW COMPARISON:  05/01/2019 FINDINGS: Continued gaseous distention of bowel. NG tube partially imaged in the stomach. No organomegaly or free air. IMPRESSION: Continued gaseous distention of bowel, unchanged. This could reflect ileus or small-bowel obstruction. Electronically Signed   By: Rolm Baptise M.D.   On: 05/02/2019 08:55   DG FEMUR, MIN 2 VIEWS RIGHT  Result Date: 05/02/2019 CLINICAL DATA:  Pain.  Surgery 04/14/2019 EXAM: RIGHT FEMUR 2 VIEWS COMPARISON:  04/14/2019, 04/12/2019 FINDINGS: Internal fixation across the right femoral intertrochanteric fracture. Stable appearance when compared to postoperative imaging from 04/14/2019. No hardware complicating feature. No subluxation or dislocation. IMPRESSION: Internal fixation across the the right femoral intertrochanteric fracture. No significant change since prior imaging. Electronically Signed   By: Rolm Baptise M.D.   On: 05/02/2019 08:58   [place] EKG SITE RITE  Result Date: 05/02/2019 If Site Rite image not attached, placement could not be confirmed due to current cardiac rhythm.   Medications: I have reviewed the patient's current medications.  Assessment: Persistent dilated small bowel loops compatible with ileus Able to tolerate full liquid diet and reports having bowel movements  Plan: Continue full liquid diet DC NG tube Keep potassium above 4 and magnesium above 2 Continue ambulation in the hallways multiple times a day Avoid narcotics Continue bowel regimen of MiraLAX, Colace and senna along with erythromycin for now If he does well in the next 24 hours, plan to advance to solid food.  Ronnette Juniper, MD 05/04/2019, 8:39 AM

## 2019-05-05 DIAGNOSIS — S728X1A Other fracture of right femur, initial encounter for closed fracture: Secondary | ICD-10-CM | POA: Diagnosis not present

## 2019-05-05 DIAGNOSIS — S72141A Displaced intertrochanteric fracture of right femur, initial encounter for closed fracture: Secondary | ICD-10-CM | POA: Diagnosis not present

## 2019-05-05 DIAGNOSIS — M25551 Pain in right hip: Secondary | ICD-10-CM | POA: Diagnosis not present

## 2019-05-05 LAB — CBC WITH DIFFERENTIAL/PLATELET
Abs Immature Granulocytes: 0.22 10*3/uL — ABNORMAL HIGH (ref 0.00–0.07)
Basophils Absolute: 0.1 10*3/uL (ref 0.0–0.1)
Basophils Relative: 1 %
Eosinophils Absolute: 0.2 10*3/uL (ref 0.0–0.5)
Eosinophils Relative: 2 %
HCT: 34.9 % — ABNORMAL LOW (ref 39.0–52.0)
Hemoglobin: 11.2 g/dL — ABNORMAL LOW (ref 13.0–17.0)
Immature Granulocytes: 2 %
Lymphocytes Relative: 12 %
Lymphs Abs: 1.8 10*3/uL (ref 0.7–4.0)
MCH: 29.9 pg (ref 26.0–34.0)
MCHC: 32.1 g/dL (ref 30.0–36.0)
MCV: 93.1 fL (ref 80.0–100.0)
Monocytes Absolute: 2.1 10*3/uL — ABNORMAL HIGH (ref 0.1–1.0)
Monocytes Relative: 14 %
Neutro Abs: 9.8 10*3/uL — ABNORMAL HIGH (ref 1.7–7.7)
Neutrophils Relative %: 69 %
Platelets: 410 10*3/uL — ABNORMAL HIGH (ref 150–400)
RBC: 3.75 MIL/uL — ABNORMAL LOW (ref 4.22–5.81)
RDW: 13 % (ref 11.5–15.5)
WBC: 14.2 10*3/uL — ABNORMAL HIGH (ref 4.0–10.5)
nRBC: 0 % (ref 0.0–0.2)

## 2019-05-05 LAB — COMPREHENSIVE METABOLIC PANEL
ALT: 12 U/L (ref 0–44)
AST: 21 U/L (ref 15–41)
Albumin: 2.8 g/dL — ABNORMAL LOW (ref 3.5–5.0)
Alkaline Phosphatase: 93 U/L (ref 38–126)
Anion gap: 10 (ref 5–15)
BUN: 13 mg/dL (ref 8–23)
CO2: 21 mmol/L — ABNORMAL LOW (ref 22–32)
Calcium: 9.4 mg/dL (ref 8.9–10.3)
Chloride: 100 mmol/L (ref 98–111)
Creatinine, Ser: 0.58 mg/dL — ABNORMAL LOW (ref 0.61–1.24)
GFR calc Af Amer: >60 mL/min (ref >60)
GFR calc non Af Amer: >60 mL/min (ref >60)
Glucose, Bld: 116 mg/dL — ABNORMAL HIGH (ref 70–99)
Potassium: 4.7 mmol/L (ref 3.5–5.1)
Sodium: 131 mmol/L — ABNORMAL LOW (ref 135–145)
Total Bilirubin: 0.7 mg/dL (ref 0.3–1.2)
Total Protein: 7.2 g/dL (ref 6.5–8.1)

## 2019-05-05 LAB — GLUCOSE, CAPILLARY
Glucose-Capillary: 114 mg/dL — ABNORMAL HIGH (ref 70–99)
Glucose-Capillary: 108 mg/dL — ABNORMAL HIGH (ref 70–99)

## 2019-05-05 LAB — MAGNESIUM: Magnesium: 2 mg/dL (ref 1.7–2.4)

## 2019-05-05 LAB — PHOSPHORUS: Phosphorus: 4.7 mg/dL — ABNORMAL HIGH (ref 2.5–4.6)

## 2019-05-05 MED ORDER — POLYETHYLENE GLYCOL 3350 17 G PO PACK: 17.0000 g | PACK | Freq: Two times a day (BID) | ORAL | 0 refills | Status: DC

## 2019-05-05 MED ORDER — FOLIC ACID 1 MG PO TABS: 1.0000 mg | ORAL_TABLET | Freq: Every day | ORAL | 1 refills | Status: DC

## 2019-05-05 MED ORDER — THIAMINE HCL 100 MG PO TABS: 100.0000 mg | ORAL_TABLET | Freq: Every day | ORAL | 1 refills | Status: DC

## 2019-05-05 MED ORDER — ASPIRIN 325 MG PO TBEC: 325.0000 mg | DELAYED_RELEASE_TABLET | Freq: Every day | ORAL | 0 refills | Status: DC

## 2019-05-05 MED ORDER — DOCUSATE SODIUM 100 MG PO CAPS: 100.0000 mg | ORAL_CAPSULE | Freq: Two times a day (BID) | ORAL | 1 refills | Status: DC

## 2019-05-05 MED ORDER — ALPRAZOLAM 0.5 MG PO TABS: 0.5000 mg | ORAL_TABLET | Freq: Three times a day (TID) | ORAL | Status: DC | PRN

## 2019-05-05 MED ORDER — CYANOCOBALAMIN 1000 MCG PO TABS: 1000.0000 ug | ORAL_TABLET | Freq: Every day | ORAL | 1 refills | Status: DC

## 2019-05-05 NOTE — Progress Notes (Signed)
PHARMACY - TOTAL PARENTERAL NUTRITION CONSULT NOTE   Indication: Concern for Ileus vs SBO, minimal po intake for>7d  Patient Measurements: Height: 5\' 8"  (172.7 cm) Weight: 160 lb 7.9 oz (72.8 kg) IBW/kg (Calculated) : 68.4 TPN AdjBW (KG): 70.3 Body mass index is 24.4 kg/m.  Assessment: 23 YOM admitted since 2/17, s/p R-hip ORIF on 1/65 complicated by post-op ileus vs SBO. NGT placed for decompression and with significant output. It is noted that the patient was NPO or with minimal intake for <7d now - pharmacy consulted to start TPN for nutritional support.   Glucose / Insulin: CBGs controlled; required 1 unit of sSSI last 24 hours.  Electrolytes: Na 131, K 4.7 (goal >4), Phos up 4.7, Mg 2 (goal >2), will monitor closely for refeeding given malnutrition and hx EtOH abuse. CoCa 10.4.  Renal: SCr 0.58, appears to be at baseline LFTs / TGs: LFTs/Tbili within normal limits, TG 115 Prealbumin / albumin: Pre-albumin 6.5, Albumin 2.8 Intake / Output; MIVF: 2.3L in/1.8L out. Good UOP. LBM 3/10 x2 on ERY 250 q12h + Miralax 17g BID and Senna 1 BID, + flatus. Net -7.8L for admission.  GI Imaging:  3/8 Abd [place] - "continued gaseous distention of bowel, unchanged >> ileus vs SBO" 3/9 Abd [place] - *"stable dilated loops concerning for ileus or distal SBO" Surgeries / Procedures:  2/18 R-hip ORIF  3/10 NGT discontinued   Central access: PICC to be placed by IV team 3/8 (contacted IV team to confirm) TPN start date: 3/8  Nutritional Goals (per RD recommendation on 3/10): kCal: 2200-2400, Protein: 110-125, Fluid: >2.2L Goal TPN rate is 95 mL/hr (provides 114 g of protein and 2119 kcals per day)  Current Nutrition:  FLD -tolerating per notes,% intake not reported but taking in 100% *3/11 Advancing to regular diet - if tolerates then can be discharged  Plan:  Patient discharging on regular diet.  Decrease TPN to half rate x2 hours then discontinue. Discontinue TPN labs.   Sloan Leiter, PharmD,  BCPS, BCCCP Clinical Pharmacist Please refer to [facility] for [place] numbers 05/05/2019 8:48 AM

## 2019-05-05 NOTE — Discharge Summary (Signed)
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 level of the gastric fundus. Sequelae of bilateral hip arthroplasty/ORIF. No acute osseous abnormality identified. Gas distended bowel loops now are predominantly large bowel. There is less small bowel gas since yesterday. Gas is present throughout the large bowel to the rectum. Loop size has mildly decreased since the earlier film yesterday. Visible lung bases are negative. IMPRESSION: 1. Appearance now suggestive of large bowel ileus. Decreased small bowel gas since yesterday, and mildly decreased large bowel caliber. 2. Enteric tube remains in place. Electronically Signed   By: Genevie Ann M.D.   On: 04/26/2019 12:54   DG Abd Portable 1V-Small Bowel Obstruction Protocol-initial, 8 hr delay  Result Date: 04/25/2019 CLINICAL DATA:  64 year old male with small bowel obstruction. EXAM: PORTABLE ABDOMEN - 1 VIEW COMPARISON:  CT abdomen pelvis dated 04/23/2019. FINDINGS: Air distended loops of  small bowel measure up to 4 cm. Air is noted within the colon. No definite free air identified. Partially visualized enteric tube with tip in the epigastric area. Degenerative changes of the spine. Left hip arthroplasty and status post internal fixation of right intertrochanteric fracture. The IMPRESSION: Air distended small bowel loops with air throughout the colon. Findings may represent a degree of obstruction or an ileus. Clinical correlation is recommended. Electronically Signed   By: Anner Crete M.D.   On: 04/25/2019 22:10   DG C-Arm 1-60 Min  Result Date: 04/14/2019 CLINICAL DATA:  Right hip surgery. EXAM: OPERATIVE RIGHT HIP (WITH PELVIS IF PERFORMED) TECHNIQUE: Fluoroscopic spot image(s) were submitted for interpretation post-operatively. COMPARISON:  04/12/2019. FINDINGS: Postsurgical changes right hip. Hardware intact. Anatomic alignment. Peripheral vascular calcification. IMPRESSION: Postsurgical changes right hip with anatomic alignment. Electronically Signed   By: Marcello Moores  Register   On: 04/14/2019 10:06   DG HIP OPERATIVE UNILAT W OR W/O PELVIS RIGHT  Result Date: 04/14/2019 CLINICAL DATA:  Right hip surgery. EXAM: OPERATIVE  RIGHT HIP (WITH PELVIS IF PERFORMED) TECHNIQUE: Fluoroscopic spot image(s) were submitted for interpretation post-operatively. COMPARISON:  04/12/2019. FINDINGS: Postsurgical changes right hip. Hardware intact. Anatomic alignment. Peripheral vascular calcification. IMPRESSION: Postsurgical changes right hip with anatomic alignment. Electronically Signed   By: Marcello Moores  Register   On: 04/14/2019 10:06   DG HIP UNILAT W OR W/O PELVIS 2-3 VIEWS RIGHT  Result Date: 04/12/2019 CLINICAL DATA:  Pain EXAM: DG HIP (WITH OR WITHOUT PELVIS) 2-3V RIGHT COMPARISON:  None. FINDINGS: There is a comminuted acute intratrochanteric fracture of the proximal right femur. The patient is status post prior total hip arthroplasty on the left. The hardware is grossly intact where visualized.  Osteopenia is noted. IMPRESSION: Acute comminuted intratrochanteric fracture of the proximal right femur. Electronically Signed   By: Constance Holster M.D.   On: 04/12/2019 22:01   DG FEMUR, MIN 2 VIEWS RIGHT  Result Date: 05/02/2019 CLINICAL DATA:  Pain.  Surgery 04/14/2019 EXAM: RIGHT FEMUR 2 VIEWS COMPARISON:  04/14/2019, 04/12/2019 FINDINGS: Internal fixation across the right femoral intertrochanteric fracture. Stable appearance when compared to postoperative imaging from 04/14/2019. No hardware complicating feature. No subluxation or dislocation. IMPRESSION: Internal fixation across the the right femoral intertrochanteric fracture. No significant change since prior imaging. Electronically Signed   By: Rolm Baptise M.D.   On: 05/02/2019 08:58   [place] EKG SITE RITE  Result Date: 05/02/2019 If Site Rite image not attached, placement could not be confirmed due to current cardiac rhythm.      Subjective:  Patient seen and examined at the bedside this morning.  Hemodynamically stable for discharge.  Discharge Exam: Vitals:   05/05/19 0528 05/05/19 0809  BP: 120/76 115/85  Pulse: (!) 102 88  Resp: 18 20  Temp: 98.6 F (37 C) 97.9 F (36.6 C)  SpO2: 99% 100%   Vitals:   05/04/19 1953 05/04/19 2135 05/05/19 0528 05/05/19 0809  BP:  127/86 120/76 115/85  Pulse:  98 (!) 102 88  Resp:  18 18 20   Temp:  98.4 F (36.9 C) 98.6 F (37 C) 97.9 F (36.6 C)  TempSrc:  Oral Oral Oral  SpO2: 98% 100% 99% 100%  Weight:      Height:        General: Pt is alert, awake, not in acute distress Cardiovascular: RRR, S1/S2 +, no rubs, no gallops Respiratory: CTA bilaterally, no wheezing, no rhonchi Abdominal: Soft, NT, ND, bowel sounds + Extremities: no edema, no cyanosis    The results of significant diagnostics from this hospitalization (including imaging, microbiology, ancillary and laboratory) are listed below for reference.     Microbiology: Recent Results (from the past 240 hour(s))   SARS CORONAVIRUS 2 (TAT 6-24 HRS) Nasopharyngeal Nasopharyngeal Swab     Status: None   Collection Time: 04/28/19  4:57 PM   Specimen: Nasopharyngeal Swab  Result Value Ref Range Status   SARS Coronavirus 2 NEGATIVE NEGATIVE Final    Comment: (NOTE) SARS-CoV-2 target nucleic acids are NOT DETECTED. The SARS-CoV-2 RNA is generally detectable in upper and lower respiratory specimens during the acute phase of infection. Negative results do not preclude SARS-CoV-2 infection, do not rule out co-infections with other pathogens, and should not be used as the sole basis for treatment or other patient management decisions. Negative results must be combined with clinical observations, patient history, and epidemiological information. The expected result is Negative. Fact Sheet for Patients: SugarRoll.be Fact Sheet for Healthcare Providers: https://www.woods-mathews.com/ This test is not yet approved or cleared by the [place]  States FDA and  has been authorized for detection and/or diagnosis of SARS-CoV-2 by FDA under an Emergency Use Authorization (EUA). This EUA will remain  in effect (meaning this test can be used) for the duration of the COVID-19 declaration under Section 56 4(b)(1) of the Act, 21 U.S.C. section 360bbb-3(b)(1), unless the authorization is terminated or revoked sooner. Performed at [facility], [place] [address], [place], [place] [address]      Labs: BNP (last 3 results) No results for input(s): BNP in the last 8760 hours. Basic Metabolic Panel: Recent Labs  Lab 04/30/19 0317 04/30/19 0317 05/01/19 0243 05/02/19 0205 05/03/19 0423 05/04/19 1107 05/05/19 0401  NA 132*   < > 132* 131* 131* 132* 131*  K 3.8   < > 4.0 4.1 4.6 4.7 4.7  CL 104   < > 103 102 100 99 100  CO2 20*   < > 19* 20* 20* 23 21*  GLUCOSE 92   < > 85 100* 134* 111* 116*  BUN <5*   < > <5* <5* 6* 10 13  CREATININE 0.61   < > 0.62 0.55* 0.64 0.49*  0.58*  CALCIUM 8.3*   < > 8.5* 8.8* 9.0 9.2 9.4  MG 1.6*  --   --  1.7 1.7 1.8 2.0  PHOS 3.4  --   --  3.8 3.6 4.3 4.7*   < > = values in this interval not displayed.   Liver Function Tests: Recent Labs  Lab 05/03/19 0423 05/05/19 0401  AST 16 21  ALT 10 12  ALKPHOS 86 93  BILITOT 1.0 0.7  PROT 6.5 7.2  ALBUMIN 2.7* 2.8*   No results for input(s): LIPASE, AMYLASE in the last 168 hours. No results for input(s): AMMONIA in the last 168 hours. CBC: Recent Labs  Lab 04/29/19 0300 04/30/19 0317 05/03/19 0423 05/04/19 1107 05/05/19 0401  WBC 15.9* 13.8* 15.2* 15.0* 14.2*  NEUTROABS 12.0* 9.9* 10.7* 10.5* 9.8*  HGB 10.8* 10.9* 11.2* 11.8* 11.2*  HCT 32.5* 33.3* 34.5* 35.7* 34.9*  MCV 92.1 93.0 93.0 92.2 93.1  PLT 549* 482* 438* 421* 410*   Cardiac Enzymes: No results for input(s): CKTOTAL, CKMB, CKMBINDEX, TROPONINI in the last 168 hours. BNP: Invalid input(s): POCBNP CBG: Recent Labs  Lab 05/04/19 0026 05/04/19 0550 05/04/19 1218 05/04/19 2324 05/05/19 0723  GLUCAP 129* 115* 125* 125* 108*   D-Dimer No results for input(s): DDIMER in the last 72 hours. Hgb A1c No results for input(s): HGBA1C in the last 72 hours. Lipid Profile No results for input(s): CHOL, HDL, LDLCALC, TRIG, CHOLHDL, LDLDIRECT in the last 72 hours. Thyroid function studies No results for input(s): TSH, T4TOTAL, T3FREE, THYROIDAB in the last 72 hours.  Invalid input(s): FREET3 Anemia work up No results for input(s): VITAMINB12, FOLATE, FERRITIN, TIBC, IRON, RETICCTPCT in the last 72 hours. Urinalysis    Component Value Date/Time   COLORURINE YELLOW 04/17/2019 1030   APPEARANCEUR CLEAR 04/17/2019 1030   LABSPEC 1.015 04/17/2019 1030   PHURINE 6.0 04/17/2019 1030   GLUCOSEU NEGATIVE 04/17/2019 1030   HGBUR NEGATIVE 04/17/2019 1030   BILIRUBINUR NEGATIVE 04/17/2019 1030   KETONESUR 20 (A) 04/17/2019 1030   PROTEINUR NEGATIVE 04/17/2019 1030   UROBILINOGEN 1.0 06/03/2013 1006   NITRITE  NEGATIVE 04/17/2019 1030   LEUKOCYTESUR NEGATIVE 04/17/2019 1030   Sepsis Labs Invalid input(s): PROCALCITONIN,  WBC,  LACTICIDVEN Microbiology Recent Results (from the past 240 hour(s))  SARS CORONAVIRUS 2 (TAT 6-24 HRS) Nasopharyngeal Nasopharyngeal Swab  Status: None   Collection Time: 04/28/19  4:57 PM   Specimen: Nasopharyngeal Swab  Result Value Ref Range Status   SARS Coronavirus 2 NEGATIVE NEGATIVE Final    Comment: (NOTE) SARS-CoV-2 target nucleic acids are NOT DETECTED. The SARS-CoV-2 RNA is generally detectable in upper and lower respiratory specimens during the acute phase of infection. Negative results do not preclude SARS-CoV-2 infection, do not rule out co-infections with other pathogens, and should not be used as the sole basis for treatment or other patient management decisions. Negative results must be combined with clinical observations, patient history, and epidemiological information. The expected result is Negative. Fact Sheet for Patients: SugarRoll.be Fact Sheet for Healthcare Providers: https://www.woods-mathews.com/ This test is not yet approved or cleared by the [place] FDA and  has been authorized for detection and/or diagnosis of SARS-CoV-2 by FDA under an Emergency Use Authorization (EUA). This EUA will remain  in effect (meaning this test can be used) for the duration of the COVID-19 declaration under Section 56 4(b)(1) of the Act, 21 U.S.C. section 360bbb-3(b)(1), unless the authorization is terminated or revoked sooner. Performed at [facility], [place] [address], [place], [place] [address]     Please note: You were cared for by a hospitalist during your hospital stay. Once you are discharged, your primary care physician will handle any further medical issues. Please note that NO REFILLS for any discharge medications will be authorized once you are discharged, as it is imperative that you  return to your primary care physician (or establish a relationship with a primary care physician if you do not have one) for your post hospital discharge needs so that they can reassess your need for medications and monitor your lab values.    Time coordinating discharge: 40 minutes  SIGNED:   Shelly Coss, MD  [facility] 05/05/2019, 10:31 AM Pager [phone]  If 7PM-7AM, please contact night-coverage [email or web address] Password [email or web address]

## 2019-05-05 NOTE — Progress Notes (Signed)
Nutrition Follow-up  DOCUMENTATION CODES:   Severe malnutrition in context of acute illness/injury  INTERVENTION:   -TPN management per pharmacy; plan to d/c today -Continue regular diet  NUTRITION DIAGNOSIS:   Severe Malnutrition related to acute illness(post-op ileus) as evidenced by mild fat depletion, moderate fat depletion, mild muscle depletion, moderate muscle depletion.  Ongoing  GOAL:   Patient will meet greater than or equal to 90% of their needs  Progressing   MONITOR:   PO intake, Supplement acceptance, Diet advancement, Labs, Weight trends, Skin, I & O's  REASON FOR ASSESSMENT:   NPO/Clear Liquid Diet    ASSESSMENT:   Duane Richardson is a 64 y.o. male with medical history significant for COPD, alcohol abuse, hypertension, GERD, arthritis and left hip fracture s/p ORIF repair by Dr. [name].  2/17- [facility]opedics evaluation  2/18- s/pPROCEDURE: Open treatment of intertrochanteric fracture with intramedullary implant. CPT [phone] 2/28- CT and x-ray revealed ileus; NGT placed 3/8- PICC placed, TPN initiated, NGT clamped, advanced to clear liquid diet 3/9- advanced to full liquid diet 3/10- NGT removed 3/11- advanced to soft diet  Pt sitting up in bed, smiling. He reports "I am so ready to get out of here". He reports good appetite, consuming 100% of breakfast. Discussed importance of good meal intake to promote healing.  Per pharmacy note, plan to d/c TPN today.   Assisted pt with belongings bag per his request. He shares that he his happy to go home, as he get to attend his grandmother's funeral tomorrow. RD expressed condolences. Pt had no further questions, however, expressed great appreciation for visit.   Labs reviewed: Na: 131, Phos: 4.7, CBGS: 108-125.  Diet Order:   Diet Order            Diet regular Room service appropriate? Yes; Fluid consistency: Thin  Diet effective now        Diet - low sodium heart healthy              EDUCATION NEEDS:   Education needs have been addressed  Skin:  Skin Assessment: Skin Integrity Issues: Skin Integrity Issues:: Incisions Incisions: rt hip  Last BM:  05/05/19  Height:   Ht Readings from Last 1 Encounters:  04/12/19 5\' 8"  (1.727 m)    Weight:   Wt Readings from Last 1 Encounters:  04/24/19 72.8 kg    Ideal Body Weight:  70 kg  BMI:  Body mass index is 24.4 kg/m.  Estimated Nutritional Needs:   Kcal:  2200-2400  Protein:  110-125 grams  Fluid:  > 2.2 L    Loistine Chance, RD, LDN, [place] Registered Dietitian II Certified Diabetes Care and Education Specialist Please refer to [facility] for RD and/or RD on-call/weekend/after hours pager

## 2019-05-05 NOTE — Care Management (Addendum)
Ordered 3 in 1 with [facility] phone [phone] fax [phone] , spoke with [place] . Tammy called back. Patient received 3 in 27 March 2018 , insurance only covers one every five years.    Requested quad based cane, ordered from [place] with [facility].  He will check to see if patient received a walker in the past.   Patient has not received a walker since 2014. Insurance would cover a walker. However, insurance will not cover a cane and a walker at the same time. Will discuss with patient on his choice preference. Patient voiced understanding and wants the quad cane. Ordered cane with Zack with [facility].  Magdalen Spatz RN

## 2019-05-05 NOTE — Progress Notes (Addendum)
PT Cancellation Note  Patient Details Name: Duane Richardson MRN: [id number] DOB: Jul 11, 1955   Cancelled Treatment:    Reason Eval/Treat Not Completed: Patient declined, no reason specified Pt states he had a rough night, since he is discharging from hospital wants to continue resting. PT will continue checking back while patient is admitted to hospital.  Ann Held PT, DPT Acute Rehab [facility] P: [place] 05/05/2019, 2:13 PM

## 2019-05-05 NOTE — TOC Transition Note (Signed)
Transition of Care [facility]) - CM/SW Discharge Note   Patient Details  Name: Duane Richardson MRN: [id number] Date of Birth: 04/22/1955  [facility] CM/SW Contact:  Alexander Mt, LCSW Phone Number: 05/05/2019, 12:39 PM   Clinical Narrative:    RNCM has arranged DME, weaning TPN for dc home today. Pt will need PTAR home. CSW has completed paperwork, PTAR called for 2:30pm per RN Percell Locus request.   Final next level of care: OP Rehab Barriers to Discharge: Barriers Resolved   Patient Goals and CMS Choice Patient states their goals for this hospitalization and ongoing recovery are:: to go to rehab CMS Medicare.gov Compare Post Acute Care list provided to:: Patient Choice offered to / list presented to : Patient  Discharge Placement Patient to be transferred to facility by: [facility] home Name of family member notified: pt responsible for self, CSW spoke with Mateo Flow ([place], [phone]) regarding dc today on the phone yesterday Patient and family notified of of transfer: 05/05/19  Discharge Plan and Services In-house Referral: Clinical Social Work Discharge Planning Services: CM Consult Post Acute Care Choice: Durable Medical Equipment          DME Arranged: 3-N-1 DME Agency: [facility] Date DME Agency Contacted: 05/04/19   Readmission Risk Interventions Readmission Risk Prevention Plan 04/20/2019  Transportation Screening Complete  PCP or Specialist Appt within 5-7 Days Not Complete  Not Complete comments plan for SNF  Home Care Screening Complete  Medication Review (RN CM) Referral to Pharmacy  Some recent data might be hidden

## 2019-05-05 NOTE — Progress Notes (Signed)
Subjective: Patient states he is feeling well today.  He continues to have bowel movements and just had a bowel movement around 9 AM today.  He denies any nausea, vomiting, abdominal pain.  He is tolerating his full liquid diet.    He is interested in being discharged, and became tearful, stating his grandmother passed away at 64 years old and her funeral is tomorrow.  Objective: Vital signs in last 24 hours: Temp:  [97.9 F (36.6 C)-98.6 F (37 C)] 97.9 F (36.6 C) (03/11 0809) Pulse Rate:  [88-109] 88 (03/11 0809) Resp:  [17-20] 20 (03/11 0809) BP: (115-127)/(76-86) 115/85 (03/11 0809) SpO2:  [97 %-100 %] 100 % (03/11 0809) Weight change:  Last BM Date: 05/05/19("State "at about 3 in the morning")  PE: GENERAL: Thin, cachectic, lying in bed in no acute distress ABDOMEN: Mildly distended, nontender, normoactive bowel sounds x4 quadrants EXTREMITIES: Without edema  Lab Results: Results for orders placed or performed during the hospital encounter of 04/12/19 (from the past 48 hour(s))  Glucose, capillary     Status: Abnormal   Collection Time: 05/03/19 12:04 PM  Result Value Ref Range   Glucose-Capillary 132 (H) 70 - 99 mg/dL    Comment: Glucose reference range applies only to samples taken after fasting for at least 8 hours.  Glucose, capillary     Status: Abnormal   Collection Time: 05/03/19  6:12 PM  Result Value Ref Range   Glucose-Capillary 138 (H) 70 - 99 mg/dL    Comment: Glucose reference range applies only to samples taken after fasting for at least 8 hours.  Glucose, capillary     Status: Abnormal   Collection Time: 05/04/19 12:26 AM  Result Value Ref Range   Glucose-Capillary 129 (H) 70 - 99 mg/dL    Comment: Glucose reference range applies only to samples taken after fasting for at least 8 hours.  Glucose, capillary     Status: Abnormal   Collection Time: 05/04/19  5:50 AM  Result Value Ref Range   Glucose-Capillary 115 (H) 70 - 99 mg/dL    Comment: Glucose  reference range applies only to samples taken after fasting for at least 8 hours.  CBC with Differential/Platelet     Status: Abnormal   Collection Time: 05/04/19 11:07 AM  Result Value Ref Range   WBC 15.0 (H) 4.0 - 10.5 K/uL   RBC 3.87 (L) 4.22 - 5.81 MIL/uL   Hemoglobin 11.8 (L) 13.0 - 17.0 g/dL   HCT 35.7 (L) 39.0 - 52.0 %   MCV 92.2 80.0 - 100.0 fL   MCH 30.5 26.0 - 34.0 pg   MCHC 33.1 30.0 - 36.0 g/dL   RDW 13.0 11.5 - 15.5 %   Platelets 421 (H) 150 - 400 K/uL   nRBC 0.0 0.0 - 0.2 %   Neutrophils Relative % 70 %   Neutro Abs 10.5 (H) 1.7 - 7.7 K/uL   Lymphocytes Relative 12 %   Lymphs Abs 1.8 0.7 - 4.0 K/uL   Monocytes Relative 15 %   Monocytes Absolute 2.2 (H) 0.1 - 1.0 K/uL   Eosinophils Relative 1 %   Eosinophils Absolute 0.2 0.0 - 0.5 K/uL   Basophils Relative 1 %   Basophils Absolute 0.1 0.0 - 0.1 K/uL   Immature Granulocytes 1 %   Abs Immature Granulocytes 0.19 (H) 0.00 - 0.07 K/uL    Comment: Performed at [facility], [address] [place], [place] [address]  Basic metabolic panel  Status: Abnormal   Collection Time: 05/04/19 11:07 AM  Result Value Ref Range   Sodium 132 (L) 135 - 145 mmol/L   Potassium 4.7 3.5 - 5.1 mmol/L   Chloride 99 98 - 111 mmol/L   CO2 23 22 - 32 mmol/L   Glucose, Bld 111 (H) 70 - 99 mg/dL    Comment: Glucose reference range applies only to samples taken after fasting for at least 8 hours.   BUN 10 8 - 23 mg/dL   Creatinine, Ser 0.49 (L) 0.61 - 1.24 mg/dL   Calcium 9.2 8.9 - 10.3 mg/dL   GFR calc non Af Amer >60 >60 mL/min   GFR calc Af Amer >60 >60 mL/min   Anion gap 10 5 - 15    Comment: Performed at [facility] [address] [place], [place] [address]  Phosphorus     Status: None   Collection Time: 05/04/19 11:07 AM  Result Value Ref Range   Phosphorus 4.3 2.5 - 4.6 mg/dL    Comment: Performed at [facility] [address] [place], [place] [address]  Magnesium     Status: None   Collection Time:  05/04/19 11:07 AM  Result Value Ref Range   Magnesium 1.8 1.7 - 2.4 mg/dL    Comment: Performed at [facility] [address] [place], [place] [address]  Glucose, capillary     Status: Abnormal   Collection Time: 05/04/19 12:18 PM  Result Value Ref Range   Glucose-Capillary 125 (H) 70 - 99 mg/dL    Comment: Glucose reference range applies only to samples taken after fasting for at least 8 hours.  Glucose, capillary     Status: Abnormal   Collection Time: 05/04/19 11:24 PM  Result Value Ref Range   Glucose-Capillary 125 (H) 70 - 99 mg/dL    Comment: Glucose reference range applies only to samples taken after fasting for at least 8 hours.  Comprehensive metabolic panel     Status: Abnormal   Collection Time: 05/05/19  4:01 AM  Result Value Ref Range   Sodium 131 (L) 135 - 145 mmol/L   Potassium 4.7 3.5 - 5.1 mmol/L   Chloride 100 98 - 111 mmol/L   CO2 21 (L) 22 - 32 mmol/L   Glucose, Bld 116 (H) 70 - 99 mg/dL    Comment: Glucose reference range applies only to samples taken after fasting for at least 8 hours.   BUN 13 8 - 23 mg/dL   Creatinine, Ser 0.58 (L) 0.61 - 1.24 mg/dL   Calcium 9.4 8.9 - 10.3 mg/dL   Total Protein 7.2 6.5 - 8.1 g/dL   Albumin 2.8 (L) 3.5 - 5.0 g/dL   AST 21 15 - 41 U/L   ALT 12 0 - 44 U/L   Alkaline Phosphatase 93 38 - 126 U/L   Total Bilirubin 0.7 0.3 - 1.2 mg/dL   GFR calc non Af Amer >60 >60 mL/min   GFR calc Af Amer >60 >60 mL/min   Anion gap 10 5 - 15    Comment: Performed at [facility], [place] [address] [place], [place] [address]  Magnesium     Status: None   Collection Time: 05/05/19  4:01 AM  Result Value Ref Range   Magnesium 2.0 1.7 - 2.4 mg/dL    Comment: Performed at [facility] [address] [place], [place] [address]  Phosphorus     Status: Abnormal   Collection  Time: 05/05/19  4:01 AM  Result Value Ref Range   Phosphorus 4.7 (H) 2.5 - 4.6 mg/dL    Comment: Performed at [facility] [address],  [place], [place] [address]  CBC with Differential/Platelet     Status: Abnormal   Collection Time: 05/05/19  4:01 AM  Result Value Ref Range   WBC 14.2 (H) 4.0 - 10.5 K/uL   RBC 3.75 (L) 4.22 - 5.81 MIL/uL   Hemoglobin 11.2 (L) 13.0 - 17.0 g/dL   HCT 34.9 (L) 39.0 - 52.0 %   MCV 93.1 80.0 - 100.0 fL   MCH 29.9 26.0 - 34.0 pg   MCHC 32.1 30.0 - 36.0 g/dL   RDW 13.0 11.5 - 15.5 %   Platelets 410 (H) 150 - 400 K/uL   nRBC 0.0 0.0 - 0.2 %   Neutrophils Relative % 69 %   Neutro Abs 9.8 (H) 1.7 - 7.7 K/uL   Lymphocytes Relative 12 %   Lymphs Abs 1.8 0.7 - 4.0 K/uL   Monocytes Relative 14 %   Monocytes Absolute 2.1 (H) 0.1 - 1.0 K/uL   Eosinophils Relative 2 %   Eosinophils Absolute 0.2 0.0 - 0.5 K/uL   Basophils Relative 1 %   Basophils Absolute 0.1 0.0 - 0.1 K/uL   Immature Granulocytes 2 %   Abs Immature Granulocytes 0.22 (H) 0.00 - 0.07 K/uL    Comment: Performed at [facility] [address] [place], [place] [address]  Glucose, capillary     Status: Abnormal   Collection Time: 05/05/19  7:23 AM  Result Value Ref Range   Glucose-Capillary 108 (H) 70 - 99 mg/dL    Comment: Glucose reference range applies only to samples taken after fasting for at least 8 hours.    Studies/Results: DG Abd 1 View  Result Date: 05/04/2019 CLINICAL DATA:  Abdominal ileus. EXAM: ABDOMEN - 1 VIEW COMPARISON:  05/03/2019 FINDINGS: NG tube remains in the stomach. Persistent dilated small bowel loops with less air in the colon. Suspect partial small bowel obstruction. Recommend correlation with bowel sounds. No free air. IMPRESSION: Persistent dilated small bowel loops suggesting partial small bowel obstruction. Electronically Signed   By: Marijo Sanes M.D.   On: 05/04/2019 09:10    Medications: I have reviewed the patient's current medications.  Assessment: Prolonged ileus, resolving. Tolerating full liquid diet and having bowel movements.  Normoactive bowel sounds in all 4 quadrants  today.  Plan:  Continue erythromycin for now, discontinue at discharge.   Patient should continue bowel regimen of MiraLAX, Colace and senna at home.  Keep potassium above 4 and magnesium above 2 Continue ambulation in the hallways multiple times a day Avoid narcotics  Advance to solid diet.  If patient tolerates, able to be discharged from a GI standpoint.  Ronnette Juniper, MD 05/05/2019, 9:57 AM

## 2019-05-11 ENCOUNTER — Ambulatory Visit (HOSPITAL_COMMUNITY): Payer: Medicare Other | Admitting: Physical Therapy

## 2019-05-17 ENCOUNTER — Ambulatory Visit (HOSPITAL_COMMUNITY): Payer: Medicare Other

## 2019-05-18 ENCOUNTER — Other Ambulatory Visit: Payer: Self-pay

## 2019-05-18 ENCOUNTER — Ambulatory Visit (HOSPITAL_COMMUNITY): Payer: Medicare Other | Attending: Internal Medicine

## 2019-05-18 ENCOUNTER — Encounter (HOSPITAL_COMMUNITY): Payer: Self-pay

## 2019-05-18 DIAGNOSIS — M25551 Pain in right hip: Secondary | ICD-10-CM | POA: Diagnosis present

## 2019-05-18 DIAGNOSIS — R262 Difficulty in walking, not elsewhere classified: Secondary | ICD-10-CM | POA: Insufficient documentation

## 2019-05-18 DIAGNOSIS — R2681 Unsteadiness on feet: Secondary | ICD-10-CM | POA: Diagnosis present

## 2019-05-18 DIAGNOSIS — M6281 Muscle weakness (generalized): Secondary | ICD-10-CM | POA: Diagnosis present

## 2019-05-18 NOTE — Therapy (Signed)
[facility] [place], [place], [address] Phone: [phone]   Fax:  [phone]  Physical Therapy Evaluation  Patient Details  Name: Duane Richardson MRN: [id number] Date of Birth: April 25, 1955 Referring Provider (PT): Shelly Coss, MD   Encounter Date: 05/18/2019  PT End of Session - 05/18/19 1046    Visit Number  1    Number of Visits  12    Date for PT Re-Evaluation  06/29/19    Authorization Type  Primary: Medicare Part B only; Secondary: Medicaid    Authorization Time Period  05/18/19 to 06/29/19    Authorization - Visit Number  --    Authorization - Number of Visits  --    Progress Note Due on Visit  10    PT Start Time  1032    PT Stop Time  1115    PT Time Calculation (min)  43 min    Equipment Utilized During Treatment  Gait belt   RW, gait belt around R thigh   Activity Tolerance  Patient tolerated treatment well    Behavior During Therapy  WFL for tasks assessed/performed       Past Medical History:  Diagnosis Date  . Allergy   . Anxiety   . Arthritis   . Asthma   . Atherosclerosis   . Bipolar 1 disorder ([place])   . Cataract   . COPD (chronic obstructive pulmonary disease) ([place])   . Depression   . Elevated PSA   . ETOH abuse    quit Oct 2013, then relapsed, as of Dec 2013 now has abstained X 1 month  . GERD (gastroesophageal reflux disease)   . Hyperlipidemia   . Hypertension   . Right hip pain 04/13/2019    Past Surgical History:  Procedure Laterality Date  . BACK SURGERY     lunbar  . COLONOSCOPY  12/21/09   [facility]:ERDE papilla otherwise normal/ pancolonic diverticula/mutiple colonic poylps  . COLONOSCOPY WITH ESOPHAGOGASTRODUODENOSCOPY (EGD) N/A 10/20/2012   Procedure: COLONOSCOPY WITH ESOPHAGOGASTRODUODENOSCOPY (EGD);  Surgeon: Daneil Dolin, MD;  [facility];  Service: Endoscopy;  Laterality: N/A;  10;15  . CYSTOSCOPY N/A 01/31/2013   Procedure: CYSTOSCOPY FLEXIBLE;  Surgeon: Marissa Nestle, MD;  Location: [facility];  Service: Urology;  Laterality: N/A;  I would like to do this around 1 pm on monday.   Marland Kitchen HARDWARE REMOVAL Left 06/07/2013   Procedure: HARDWARE REMOVAL;  Surgeon: Mcarthur Rossetti, MD;  Location: [facility];  Service: Orthopedics;  Laterality: Left;  . INTRAMEDULLARY (IM) NAIL INTERTROCHANTERIC Right 04/14/2019   Procedure: INTRAMEDULLARY (IM) NAIL INTERTROCHANTRIC;  Surgeon: Leandrew Koyanagi, MD;  Location: [facility];  Service: Orthopedics;  Laterality: Right;  . ORIF HIP FRACTURE Left 11/24/2012   Procedure: OPEN REDUCTION INTERNAL FIXATION HIP;  Surgeon: Sanjuana Kava, MD;  Location: [facility];  Service: Orthopedics;  Laterality: Left;  . SPINE SURGERY    . TOTAL HIP ARTHROPLASTY Left 06/07/2013   Procedure: REMOVE HARDWARE LEFT HIP AND LEFT TOTAL HIP ARTHROPLASTY ANTERIOR APPROACH;  Surgeon: Mcarthur Rossetti, MD;  Location: [facility];  Service: Orthopedics;  Laterality: Left;  . TRANSURETHRAL RESECTION OF PROSTATE N/A 03/22/2013   Procedure: TRANSURETHRAL RESECTION OF THE PROSTATE (TURP);  Surgeon: Marissa Nestle, MD;  Location: [facility];  Service: Urology;  Laterality: N/A;    There were no vitals filed for this visit.   Subjective Assessment - 05/18/19 1039    Subjective  Pt reports he  thought his surgery was on 04/12/19, but agreed when reviewed hospital stay that IM nail was on 04/14/19. Pt reports WC fell on top of his leg which caused him to break his leg. Pt reports using RW for transfers and household ambulation, pt reports using "hop around" for the community which is an Transport planner with basket on the front. Pt reports unable to stand for long durations and balance problems. Pt reports ~2 falls in last 6 months while trying to reach for things, falling backwards. Pt reports uses something on chair to help self push up from chair. Pt reports falling off "hop around" ~3 times but unable to explain what causes him to fall off.    How long can you sit comfortably?   no issues    How long can you stand comfortably?  5 minutes    How long can you walk comfortably?  5 minutes    Patient Stated Goals  to ride bicycle and lift weights    Currently in Pain?  No/denies         [facility] PT Assessment - 05/18/19 0001      Assessment   Medical Diagnosis  s/p R IM nail, weakness, L hip arthritis    Referring Provider (PT)  Shelly Coss, MD    Onset Date/Surgical Date  04/14/19    Next MD Visit  no follow up    Prior Therapy  Acute PT      Precautions   Precautions  Fall      Restrictions   Weight Bearing Restrictions  No      Balance Screen   Has the patient fallen in the past 6 months  Yes    How many times?  ~2    Has the patient had a decrease in activity level because of a fear of falling?   Yes    Is the patient reluctant to leave their home because of a fear of falling?   No      Prior Function   Level of Independence  Needs assistance with ADLs;Needs assistance with homemaking;Independent with household mobility with device;Independent with community mobility with device    Leisure  play games      Cognition   Overall Cognitive Status  Within Functional Limits for tasks assessed      Observation/Other Assessments   Focus on Therapeutic Outcomes (FOTO)   next session      Sensation   Light Touch  Appears Intact      Functional Tests   Functional tests  Sit to Stand      Sit to Stand   Comments  5x STS: 40 sec, from chair, with BUE assist, increased BLE valgus      ROM / Strength   AROM / PROM / Strength  Strength                      Strength   Overall Strength Comments  hip abd and knee flexion tested in sitting    Strength Assessment Site  Hip;Knee;Ankle    Right Hip Flexion  3+/5    Right Hip Extension  3/5    Right Hip ABduction  3/5    Left Hip Flexion  3+/5    Left Hip Extension  3/5    Left Hip ABduction  3/5    Right Knee Flexion  3+/5    Right Knee Extension  3+/5   lacking 10 deg extension   Left Knee  Flexion  3+/5    Left Knee Extension  4/5    Right Ankle Dorsiflexion  4+/5    Left Ankle Dorsiflexion  4+/5      Ambulation/Gait   Ambulation/Gait  Yes    Ambulation/Gait Assistance  5: Supervision    Ambulation Distance (Feet)  36 Feet    Assistive device  Rolling walker    Gait Pattern  Step-to pattern;Decreased step length - right;Decreased stance time - right;Decreased hip/knee flexion - right;Decreased hip/knee flexion - left;Decreased dorsiflexion - right;Decreased dorsiflexion - left;Shuffle;Trunk flexed;Narrow base of support    Gait velocity  0.34 m/s    Gait Comments  occasional R IR during gait pattern      Balance   Balance Assessed  Yes      Static Standing Balance   Static Standing - Balance Support  No upper extremity supported    Static Standing Balance -  Activities   Single Leg Stance - Right Leg;Single Leg Stance - Left Leg;Tandam Stance - Right Leg;Tandam Stance - Left Leg    Static Standing - Comment/# of Minutes  unable to perform SLS or tandem stance without UE assist      Standardized Balance Assessment   Standardized Balance Assessment  Dynamic Gait Index      Dynamic Gait Index   Level Surface  Mild Impairment    Change in Gait Speed  Severe Impairment    Gait with Horizontal Head Turns  Moderate Impairment    Gait with Vertical Head Turns  Moderate Impairment    Gait and Pivot Turn  Moderate Impairment    Step Over Obstacle  Moderate Impairment    Step Around Obstacles  Moderate Impairment    Steps  Severe Impairment    Total Score  7         Objective measurements completed on examination: See above findings.       PT Education - 05/18/19 1339    Education Details  Assessment findings, initiated HEP, POC.    Person(s) Educated  Patient    Methods  Explanation;Demonstration;Handout;Verbal cues    Comprehension  Verbalized understanding;Returned demonstration       PT Short Term Goals - 05/18/19 1353      PT SHORT TERM GOAL #1    Title  Pt will perform HEP at least 3x/week to improve functional strength needed for bed mobility, transfers, and ambulation.    Time  3    Period  Weeks    Status  New    Target Date  06/08/19      PT SHORT TERM GOAL #2   Title  Pt will ambulate with 0.4 m/s gait speed using RW to ambulate limited community distances safely and decrease risk for falls.    Time  3    Period  Weeks    Status  New        PT Long Term Goals - 05/18/19 1353      PT LONG TERM GOAL #1   Title  Pt will demo 4/5 BLE strength to improve standing tolerance and ambulation tolerance to allow pt to participate in cooking and light household chores.    Time  6    Period  Weeks    Status  New    Target Date  06/29/19      PT LONG TERM GOAL #2   Title  Pt will perform 5x STS in 30 sec or < to allow pt to rise from chair and navigate community  safely.    Time  6    Period  Weeks    Status  New      PT LONG TERM GOAL #3   Title  Pt will ambulate with 0.8 m/s gait speed using RW to ambulate community distances safely and decrease risk for falls.    Time  6    Period  Weeks    Status  New      PT LONG TERM GOAL #4   Title  Pt will self report 1 or < falls since starting therapy demoing improved functional strength with ambulation and transfers.    Time  6    Period  Weeks    Status  New             Plan - 05/18/19 1344    Clinical Impression Statement  Pt is a pleasant 64YO male s/p R femur IM nail on 2/18, L hip arthritis, and generalized weakness. Pt demonstrates BLE weakness and uses gait belt around R thigh to assist leg with bed mobility. Pt demonstrates balance deficits per SLS, tandem stance and DGI. Pt is functionally weak, requiring increased time with STS transfers and bed mobility. Pt with R hip pain with mobility, but able to continue with rest breaks. Pt would benefit from PT interventions to improve deficits noted and improve overall QoL.    Personal Factors and Comorbidities   Comorbidity 2;Education;Social Background    Comorbidities  HTN, arthritis    Examination-Activity Limitations  Bed Mobility;Locomotion Level;Stairs;Stand    Examination-Participation Restrictions  Cleaning;Community Activity;Laundry;Meal Prep;Yard Work    [employer]chant [employer] officer  Stable/Uncomplicated    Designer, jewellery  Low    Rehab Potential  Fair    PT Frequency  2x / week    PT Duration  6 weeks    PT Treatment/Interventions  ADLs/Self Care Home Management;Aquatic Therapy;Cryotherapy;Moist Heat;DME Instruction;Gait training;Stair training;Functional mobility training;Therapeutic activities;Therapeutic exercise;Balance training;Neuromuscular re-education;Patient/family education;Orthotic Fit/Training;Wheelchair mobility training;Manual techniques;Scar mobilization;Passive range of motion;Taping;Joint Manipulations    PT Next Visit Plan  Complete FOTO, 2MWT. Begin functional strengthening of BLE, balance exercises, and gait training.    PT Home Exercise Plan  Eval: seated heel raises, seated toe raises, LAQ, seated marching, seated hip abduction    Consulted and Agree with Plan of Care  Patient       Patient will benefit from skilled therapeutic intervention in order to improve the following deficits and impairments:  Abnormal gait, Decreased activity tolerance, Decreased balance, Decreased endurance, Decreased knowledge of use of DME, Decreased mobility, Decreased range of motion, Decreased strength, Difficulty walking, Hypomobility, Postural dysfunction, Pain  Visit Diagnosis: Muscle weakness (generalized)  Unsteadiness on feet  Difficulty in walking, not elsewhere classified  Pain in right hip     Problem List Patient Active Problem List   Diagnosis Date Noted  . Other fracture of right femur, initial encounter for closed fracture ([place]) 04/13/2019  . Atherosclerosis   . Acute colitis 12/05/2015  . Syncope 12/05/2015  . Hypokalemia 12/05/2015  .  Hypomagnesemia 12/05/2015  . Thrombocytopenia ([place]) 12/05/2015  . Alcohol withdrawal ([place]) 12/04/2015  . Generalized weakness 12/04/2015  . Abdominal pain 12/04/2015  . Nausea & vomiting 12/04/2015  . Arthritis of left hip 06/07/2013  . Status post THR (total hip replacement) 06/07/2013  . BPH (benign prostatic hypertrophy) with urinary obstruction 03/22/2013  . Protein-calorie malnutrition, severe ([place]) 11/30/2012  . Hip fracture ([place]) 11/23/2012  . HTN (hypertension) 11/23/2012  . Steatosis of liver 11/15/2012  . Anorexia  nervosa 10/13/2012  . Loss of weight 07/16/2012  . ETOH abuse   . Weight loss 02/17/2012  . Dysphagia 12/30/2011  . Oral candida 12/30/2011  . Rectal bleed 12/30/2011  . Unintentional weight loss 12/30/2011  . Hx of adenomatous colonic polyps 12/30/2011  . Leucopenia 07/08/2007  . COPD (chronic obstructive pulmonary disease) ([place]) 12/21/2006  . LIVER FUNCTION TESTS, ABNORMAL 11/09/2006  . HYPERLIPIDEMIA 05/27/2006  . DISORDER, BIPOLAR NOS 05/27/2006  . TOBACCO ABUSE 05/27/2006  . Depression 05/27/2006  . CATARACT NOS 05/27/2006  . ALLERGIC RHINITIS 05/27/2006  . ASTHMA 05/27/2006  . GERD 05/27/2006  . ARTHRITIS 05/27/2006  . LOW BACK PAIN, CHRONIC 05/27/2006  . MALAISE AND FATIGUE 05/27/2006    Talbot Grumbling PT, DPT 05/18/19, 1:57 PM [facility] [address] [place], [place], [address] Phone: [phone]   Fax:  [phone]  Name: Duane Richardson MRN: [id number] Date of Birth: 04-Dec-1955

## 2019-05-19 NOTE — Addendum Note (Signed)
Addended by: Laneta Simmers B on: 05/19/2019 01:04 PM   Modules accepted: Orders

## 2019-05-26 ENCOUNTER — Other Ambulatory Visit: Payer: Self-pay

## 2019-05-26 ENCOUNTER — Ambulatory Visit (HOSPITAL_COMMUNITY): Payer: Medicare Other | Attending: Internal Medicine

## 2019-05-26 ENCOUNTER — Encounter (HOSPITAL_COMMUNITY): Payer: Self-pay

## 2019-05-26 DIAGNOSIS — M6281 Muscle weakness (generalized): Secondary | ICD-10-CM

## 2019-05-26 DIAGNOSIS — R262 Difficulty in walking, not elsewhere classified: Secondary | ICD-10-CM

## 2019-05-26 DIAGNOSIS — R2681 Unsteadiness on feet: Secondary | ICD-10-CM

## 2019-05-26 DIAGNOSIS — M25551 Pain in right hip: Secondary | ICD-10-CM | POA: Diagnosis present

## 2019-05-26 NOTE — Therapy (Signed)
[place], [place], [address] Phone: [phone]   Fax:  [phone]  Physical Therapy Treatment  Patient Details  Name: Duane Richardson MRN: [id number] Date of Birth: [birth year] Referring Provider (PT): Shelly Coss, MD   Encounter Date: 05/26/2019  PT End of Session - 05/26/19 1054    Visit Number  2    Number of Visits  12    Date for PT Re-Evaluation  06/29/19    Authorization Type  Primary: Medicare Part B only; Secondary: Medicaid    Authorization Time Period  05/18/19 to 06/29/19    Authorization - Visit Number  2    Authorization - Number of Visits  10    Progress Note Due on Visit  10    PT Start Time  1048    PT Stop Time  1130    PT Time Calculation (min)  42 min    Equipment Utilized During Treatment  Gait belt   RW, Gait belt around Rt thigh   Activity Tolerance  Patient tolerated treatment well    Behavior During Therapy  WFL for tasks assessed/performed       Past Medical History:  Diagnosis Date  . Allergy   . Anxiety   . Arthritis   . Asthma   . Atherosclerosis   . Bipolar 1 disorder ([place])   . Cataract   . COPD (chronic obstructive pulmonary disease) ([place])   . Depression   . Elevated PSA   . ETOH abuse    quit Oct 2013, then relapsed, as of Dec 2013 now has abstained X 1 month  . GERD (gastroesophageal reflux disease)   . Hyperlipidemia   . Hypertension   . Right hip pain 04/13/2019    Past Surgical History:  Procedure Laterality Date  . BACK SURGERY     lunbar  . COLONOSCOPY  12/21/09   [id number] papilla otherwise normal/ pancolonic diverticula/mutiple colonic poylps  . COLONOSCOPY WITH ESOPHAGOGASTRODUODENOSCOPY (EGD) N/A 10/20/2012   Procedure: COLONOSCOPY WITH ESOPHAGOGASTRODUODENOSCOPY (EGD);  Surgeon: Daneil Dolin, MD;  [facility];  Service: Endoscopy;  Laterality: N/A;  10;15  . CYSTOSCOPY N/A 01/31/2013   Procedure: CYSTOSCOPY FLEXIBLE;  Surgeon: Marissa Nestle, MD;  Location: [facility];  Service: Urology;  Laterality: N/A;  I would like to do this around 1 pm on monday.   Marland Kitchen HARDWARE REMOVAL Left 06/07/2013   Procedure: HARDWARE REMOVAL;  Surgeon: Mcarthur Rossetti, MD;  Location: [facility];  Service: Orthopedics;  Laterality: Left;  . INTRAMEDULLARY (IM) NAIL INTERTROCHANTERIC Right 04/14/2019   Procedure: INTRAMEDULLARY (IM) NAIL INTERTROCHANTRIC;  Surgeon: Leandrew Koyanagi, MD;  Location: [facility];  Service: Orthopedics;  Laterality: Right;  . ORIF HIP FRACTURE Left 11/24/2012   Procedure: OPEN REDUCTION INTERNAL FIXATION HIP;  Surgeon: Sanjuana Kava, MD;  [facility];  Service: Orthopedics;  Laterality: Left;  . SPINE SURGERY    . TOTAL HIP ARTHROPLASTY Left 06/07/2013   Procedure: REMOVE HARDWARE LEFT HIP AND LEFT TOTAL HIP ARTHROPLASTY ANTERIOR APPROACH;  Surgeon: Mcarthur Rossetti, MD;  Location: [facility];  Service: Orthopedics;  Laterality: Left;  . TRANSURETHRAL RESECTION OF PROSTATE N/A 03/22/2013   Procedure: TRANSURETHRAL RESECTION OF THE PROSTATE (TURP);  Surgeon: Marissa Nestle, MD;  [facility];  Service: Urology;  Laterality: N/A;    There were no vitals filed for this visit.  Subjective Assessment - 05/26/19 1052    Subjective  Pt stated he doing  okay today, Rt hip stiff and achey pain.  Pain scale 7/10 Rt hip.  Reports he has completed the HEP exercises daily without quesitons.    Patient Stated Goals  to ride bicycle and lift weights    Currently in Pain?  Yes    Pain Score  7     Pain Location  Hip    Pain Orientation  Right    Pain Descriptors / Indicators  Aching;Sore;Tightness    Pain Type  Acute pain         [facility] PT Assessment - 05/26/19 0001      Assessment   Medical Diagnosis  s/p R IM nail, weakness, L hip arthritis    Referring Provider (PT)  Shelly Coss, MD    Onset Date/Surgical Date  04/14/19    Next MD Visit  no follow up    Prior Therapy  Acute PT      Precautions   Precautions  Fall       Observation/Other Assessments   Focus on Therapeutic Outcomes (FOTO)   44% limited                   [facility] Adult PT Treatment/Exercise - 05/26/19 0001      Ambulation/Gait   Ambulation/Gait  Yes    Ambulation/Gait Assistance  5: Supervision    Ambulation Distance (Feet)  90 Feet    Assistive device  Rolling walker    Gait Pattern  Step-to pattern;Decreased step length - right;Decreased stance time - right;Decreased hip/knee flexion - right;Decreased hip/knee flexion - left;Decreased dorsiflexion - right;Decreased dorsiflexion - left;Shuffle;Trunk flexed;Narrow base of support    Gait Comments  2MWT      Exercises   Exercises  Knee/Hip      Knee/Hip Exercises: Standing   Hip Abduction  10 reps;Knee straight    Abduction Limitations  BLE cueing for form    Hip Extension  10 reps;Knee straight;Both    Functional Squat  10 reps    Functional Squat Limitations  front of chair      Knee/Hip Exercises: Seated   Long Arc Quad  Both;10 reps    Other Seated Knee/Hip Exercises  heel/toe raises    Marching  10 reps;Both               PT Short Term Goals - 05/18/19 1353      PT SHORT TERM GOAL #1   Title  Pt will perform HEP at least 3x/week to improve functional strength needed for bed mobility, transfers, and ambulation.    Time  3    Period  Weeks    Status  New    Target Date  06/08/19      PT SHORT TERM GOAL #2   Title  Pt will ambulate with 0.4 m/s gait speed using RW to ambulate limited community distances safely and decrease risk for falls.    Time  3    Period  Weeks    Status  New        PT Long Term Goals - 05/18/19 1353      PT LONG TERM GOAL #1   Title  Pt will demo 4/5 BLE strength to improve standing tolerance and ambulation tolerance to allow pt to participate in cooking and light household chores.    Time  6    Period  Weeks    Status  New    Target Date  06/29/19      PT LONG TERM GOAL #  2   Title  Pt will perform 5x STS in 30 sec  or < to allow pt to rise from chair and navigate community safely.    Time  6    Period  Weeks    Status  New      PT LONG TERM GOAL #3   Title  Pt will ambulate with 0.8 m/s gait speed using RW to ambulate community distances safely and decrease risk for falls.    Time  6    Period  Weeks    Status  New      PT LONG TERM GOAL #4   Title  Pt will self report 1 or < falls since starting therapy demoing improved functional strength with ambulation and transfers.    Time  6    Period  Weeks    Status  New            Plan - 05/26/19 1136    Clinical Impression Statement  Reviewed goals and assured compliance with HEP.  Pt able to demonstrate all exercises correclty.  Reports a corn on Lt foot and decreased tolerance with standing heel raises, was able to complete seated without issues.  FOTO complete with 44% limitation.  2MWT, able to ambulate 57ft with RW some unsteadiness upon initial standing though able to ambulate no LOB episodes, does present with decreased weight bearing Lt LE and decrease step length Rt LE.  Therex focus on hip strengthening wiht verbal and tactile cueing to improve form and mechanics.  Used chair behind to improve mechanics wiht squats.    Personal Factors and Comorbidities  Comorbidity 2;Education;Social Background    Comorbidities  HTN, arthritis    Examination-Activity Limitations  Bed Mobility;Locomotion Level;Stairs;Stand    Examination-Participation Restrictions  Cleaning;Community Activity;Laundry;Meal Prep;Yard Work    Merchant [employer] officer  Stable/Uncomplicated    Designer, jewellery  Low    Rehab Potential  Fair    PT Frequency  2x / week    PT Duration  6 weeks    PT Treatment/Interventions  ADLs/Self Care Home Management;Aquatic Therapy;Cryotherapy;Moist Heat;DME Instruction;Gait training;Stair training;Functional mobility training;Therapeutic activities;Therapeutic exercise;Balance training;Neuromuscular  re-education;Patient/family education;Orthotic Fit/Training;Wheelchair mobility training;Manual techniques;Scar mobilization;Passive range of motion;Taping;Joint Manipulations    PT Next Visit Plan  Next session add rockerboard, gait training to equal stride length, functional strengthening BLE and balance exercises.    PT Home Exercise Plan  Eval: seated heel raises, seated toe raises, LAQ, seated marching, seated hip abduction       Patient will benefit from skilled therapeutic intervention in order to improve the following deficits and impairments:  Abnormal gait, Decreased activity tolerance, Decreased balance, Decreased endurance, Decreased knowledge of use of DME, Decreased mobility, Decreased range of motion, Decreased strength, Difficulty walking, Hypomobility, Postural dysfunction, Pain  Visit Diagnosis: Muscle weakness (generalized)  Difficulty in walking, not elsewhere classified  Unsteadiness on feet  Pain in right hip     Problem List Patient Active Problem List   Diagnosis Date Noted  . Other fracture of right femur, initial encounter for closed fracture ([place]) 04/13/2019  . Atherosclerosis   . Acute colitis 12/05/2015  . Syncope 12/05/2015  . Hypokalemia 12/05/2015  . Hypomagnesemia 12/05/2015  . Thrombocytopenia ([place]) 12/05/2015  . Alcohol withdrawal ([place]) 12/04/2015  . Generalized weakness 12/04/2015  . Abdominal pain 12/04/2015  . Nausea & vomiting 12/04/2015  . Arthritis of left hip 06/07/2013  . Status post THR (total hip replacement) 06/07/2013  . BPH (benign prostatic hypertrophy) with urinary  obstruction 03/22/2013  . Protein-calorie malnutrition, severe ([place]) 11/30/2012  . Hip fracture ([place]) 11/23/2012  . HTN (hypertension) 11/23/2012  . Steatosis of liver 11/15/2012  . Anorexia nervosa 10/13/2012  . Loss of weight 07/16/2012  . ETOH abuse   . Weight loss 02/17/2012  . Dysphagia 12/30/2011  . Oral candida 12/30/2011  . Rectal bleed 12/30/2011  .  Unintentional weight loss 12/30/2011  . Hx of adenomatous colonic polyps 12/30/2011  . Leucopenia 07/08/2007  . COPD (chronic obstructive pulmonary disease) ([place]) 12/21/2006  . LIVER FUNCTION TESTS, ABNORMAL 11/09/2006  . HYPERLIPIDEMIA 05/27/2006  . DISORDER, BIPOLAR NOS 05/27/2006  . TOBACCO ABUSE 05/27/2006  . Depression 05/27/2006  . CATARACT NOS 05/27/2006  . ALLERGIC RHINITIS 05/27/2006  . ASTHMA 05/27/2006  . GERD 05/27/2006  . ARTHRITIS 05/27/2006  . LOW BACK PAIN, CHRONIC 05/27/2006  . MALAISE AND FATIGUE 05/27/2006   Ihor Austin, LPTA/CLT; CBIS [phone]  Aldona Lento 05/26/2019, 11:51 AM  [place], [place], [address] Phone: [phone]   Fax:  [phone]  Name: Amaziah Raisanen Pasley MRN: [id number] Date of Birth: 1955-08-04

## 2019-05-30 ENCOUNTER — Ambulatory Visit (HOSPITAL_COMMUNITY): Payer: Medicare Other | Admitting: Physical Therapy

## 2019-05-30 ENCOUNTER — Telehealth (HOSPITAL_COMMUNITY): Payer: Self-pay | Admitting: Physical Therapy

## 2019-05-30 NOTE — Telephone Encounter (Signed)
pt cancelled appt because he forgot about it and is not dressed

## 2019-05-31 ENCOUNTER — Ambulatory Visit (INDEPENDENT_AMBULATORY_CARE_PROVIDER_SITE_OTHER): Payer: Medicare Other | Admitting: Orthopaedic Surgery

## 2019-05-31 ENCOUNTER — Ambulatory Visit (INDEPENDENT_AMBULATORY_CARE_PROVIDER_SITE_OTHER): Payer: Medicare Other

## 2019-05-31 ENCOUNTER — Encounter: Payer: Self-pay | Admitting: Orthopaedic Surgery

## 2019-05-31 ENCOUNTER — Other Ambulatory Visit: Payer: Self-pay

## 2019-05-31 ENCOUNTER — Other Ambulatory Visit: Payer: Self-pay | Admitting: Physician Assistant

## 2019-05-31 DIAGNOSIS — S72144D Nondisplaced intertrochanteric fracture of right femur, subsequent encounter for closed fracture with routine healing: Secondary | ICD-10-CM

## 2019-05-31 MED ORDER — TIZANIDINE HCL 2 MG PO TABS: 2.0000 mg | ORAL_TABLET | Freq: Two times a day (BID) | ORAL | 0 refills | Status: DC | PRN

## 2019-05-31 NOTE — Progress Notes (Deleted)
Post-Op Visit Note   Patient: Duane Richardson           Date of Birth: 1955-09-18           MRN: [id number] Visit Date: 05/31/2019 PCP: [name], MD   Assessment & Plan:  Chief Complaint:  Chief Complaint  Patient presents with  . Right Hip - Pain   Visit Diagnoses:  1. Pain in right hip     Plan: ***  Follow-Up Instructions: Return in about 6 weeks (around 07/12/2019).   Orders:  Orders Placed This Encounter  Procedures  . XR HIP UNILAT W OR W/O PELVIS 2-3 VIEWS RIGHT   No orders of the defined types were placed in this encounter.   Imaging: No results found.  PMFS History: Patient Active Problem List   Diagnosis Date Noted  . Other fracture of right femur, initial encounter for closed fracture ([facility]) 04/13/2019  . Atherosclerosis   . Acute colitis 12/05/2015  . Syncope 12/05/2015  . Hypokalemia 12/05/2015  . Hypomagnesemia 12/05/2015  . Thrombocytopenia ([place]) 12/05/2015  . Alcohol withdrawal ([place]) 12/04/2015  . Generalized weakness 12/04/2015  . Abdominal pain 12/04/2015  . Nausea & vomiting 12/04/2015  . Arthritis of left hip 06/07/2013  . Status post THR (total hip replacement) 06/07/2013  . BPH (benign prostatic hypertrophy) with urinary obstruction 03/22/2013  . Protein-calorie malnutrition, severe ([place]) 11/30/2012  . Hip fracture ([place]) 11/23/2012  . HTN (hypertension) 11/23/2012  . Steatosis of liver 11/15/2012  . Anorexia nervosa 10/13/2012  . Loss of weight 07/16/2012  . ETOH abuse   . Weight loss 02/17/2012  . Dysphagia 12/30/2011  . Oral candida 12/30/2011  . Rectal bleed 12/30/2011  . Unintentional weight loss 12/30/2011  . Hx of adenomatous colonic polyps 12/30/2011  . Leucopenia 07/08/2007  . COPD (chronic obstructive pulmonary disease) ([place]) 12/21/2006  . LIVER FUNCTION TESTS, ABNORMAL 11/09/2006  . HYPERLIPIDEMIA 05/27/2006  . DISORDER, BIPOLAR NOS 05/27/2006  . TOBACCO ABUSE 05/27/2006  . Depression 05/27/2006  . CATARACT  NOS 05/27/2006  . ALLERGIC RHINITIS 05/27/2006  . ASTHMA 05/27/2006  . GERD 05/27/2006  . ARTHRITIS 05/27/2006  . LOW BACK PAIN, CHRONIC 05/27/2006  . MALAISE AND FATIGUE 05/27/2006   Past Medical History:  Diagnosis Date  . Allergy   . Anxiety   . Arthritis   . Asthma   . Atherosclerosis   . Bipolar 1 disorder ([place])   . Cataract   . COPD (chronic obstructive pulmonary disease) ([place])   . Depression   . Elevated PSA   . ETOH abuse    quit Oct 2013, then relapsed, as of Dec 2013 now has abstained X 1 month  . GERD (gastroesophageal reflux disease)   . Hyperlipidemia   . Hypertension   . Right hip pain 04/13/2019    Family History  Problem Relation Age of Onset  . Mental illness Sister   . Colon cancer Neg Hx     Past Surgical History:  Procedure Laterality Date  . BACK SURGERY     lunbar  . COLONOSCOPY  12/21/09   [id number] papilla otherwise normal/ pancolonic diverticula/mutiple colonic poylps  . COLONOSCOPY WITH ESOPHAGOGASTRODUODENOSCOPY (EGD) N/A 10/20/2012   Procedure: COLONOSCOPY WITH ESOPHAGOGASTRODUODENOSCOPY (EGD);  Surgeon: Daneil Dolin, MD;  [facility];  Service: Endoscopy;  Laterality: N/A;  10;15  . CYSTOSCOPY N/A 01/31/2013   Procedure: CYSTOSCOPY FLEXIBLE;  Surgeon: Marissa Nestle, MD;  [facility];  Service: Urology;  Laterality: N/A;  I would  like to do this around 1 pm on monday.   Marland Kitchen HARDWARE REMOVAL Left 06/07/2013   Procedure: HARDWARE REMOVAL;  Surgeon: Mcarthur Rossetti, MD;  Location: [facility];  Service: Orthopedics;  Laterality: Left;  . INTRAMEDULLARY (IM) NAIL INTERTROCHANTERIC Right 04/14/2019   Procedure: INTRAMEDULLARY (IM) NAIL INTERTROCHANTRIC;  Surgeon: Leandrew Koyanagi, MD;  Location: [facility];  Service: Orthopedics;  Laterality: Right;  . ORIF HIP FRACTURE Left 11/24/2012   Procedure: OPEN REDUCTION INTERNAL FIXATION HIP;  Surgeon: Sanjuana Kava, MD;  [facility];  Service: Orthopedics;  Laterality: Left;  . SPINE  SURGERY    . TOTAL HIP ARTHROPLASTY Left 06/07/2013   Procedure: REMOVE HARDWARE LEFT HIP AND LEFT TOTAL HIP ARTHROPLASTY ANTERIOR APPROACH;  Surgeon: Mcarthur Rossetti, MD;  Location: [facility];  Service: Orthopedics;  Laterality: Left;  . TRANSURETHRAL RESECTION OF PROSTATE N/A 03/22/2013   Procedure: TRANSURETHRAL RESECTION OF THE PROSTATE (TURP);  Surgeon: Marissa Nestle, MD;  [facility];  Service: Urology;  Laterality: N/A;   Social History   Occupational History  . Occupation: disabled  Tobacco Use  . Smoking status: Current Every Day Smoker    Packs/day: 0.50    Years: 30.00    Pack years: 15.00    Types: Cigarettes  . Smokeless tobacco: Never Used  Substance and Sexual Activity  . Alcohol use: No    Alcohol/week: 30.0 standard drinks    Types: 30 Glasses of wine per week    Comment: history of  about a liter of wine per day-before that  . Drug use: Not Currently    Types: Marijuana, Cocaine    Comment: Per patient "years ago" 6/20  . Sexual activity: Yes    Birth control/protection: None

## 2019-05-31 NOTE — Progress Notes (Signed)
Post-Op Visit Note   Patient: Duane Richardson           Date of Birth: [birth year]           MRN: [id number] Visit Date: 05/31/2019 PCP: [name], MD   Assessment & Plan:  Chief Complaint:  Chief Complaint  Patient presents with  . Right Hip - Pain   Visit Diagnoses:  1. Closed nondisplaced intertrochanteric fracture of right femur with routine healing, subsequent encounter     Plan: [name] is 6 weeks status post right intertrochanteric fracture.  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  He is doing well overall and reports no significant pain.  He has been participating with PT and advancing.  Surgical scar is fully healed.  He does not have any pain with hip range of motion.  His x-rays demonstrate continued healing of the fracture without any hardware complications.  Happy with how he looks today and we will see him back in another 6 weeks.  In the meantime he will continue to push with PT.  Follow-Up Instructions: Return in about 6 weeks (around 07/12/2019).   Orders:  Orders Placed This Encounter  Procedures  . XR HIP UNILAT W OR W/O PELVIS 2-3 VIEWS RIGHT   Meds ordered this encounter  Medications  . DISCONTD: tiZANidine (ZANAFLEX) 2 MG tablet    Sig: Take 1 tablet (2 mg total) by mouth 2 (two) times daily as needed for muscle spasms.    Dispense:  20 tablet    Refill:  0    Imaging: XR HIP UNILAT W OR W/O PELVIS 2-3 VIEWS RIGHT  Result Date: 05/31/2019 Stable intertrochanteric fracture with evidence of fracture healing   PMFS History: Patient Active Problem List   Diagnosis Date Noted  . Other fracture of right femur, initial encounter for closed fracture ([place]) 04/13/2019  . Atherosclerosis   . Acute colitis 12/05/2015  . Syncope 12/05/2015  . Hypokalemia 12/05/2015  . Hypomagnesemia 12/05/2015  . Thrombocytopenia ([place]) 12/05/2015  . Alcohol withdrawal ([place]) 12/04/2015  . Generalized weakness 12/04/2015  . Abdominal pain 12/04/2015  . Nausea & vomiting  12/04/2015  . Arthritis of left hip 06/07/2013  . Status post THR (total hip replacement) 06/07/2013  . BPH (benign prostatic hypertrophy) with urinary obstruction 03/22/2013  . Protein-calorie malnutrition, severe ([place]) 11/30/2012  . Hip fracture ([place]) 11/23/2012  . HTN (hypertension) 11/23/2012  . Steatosis of liver 11/15/2012  . Anorexia nervosa 10/13/2012  . Loss of weight 07/16/2012  . ETOH abuse   . Weight loss 02/17/2012  . Dysphagia 12/30/2011  . Oral candida 12/30/2011  . Rectal bleed 12/30/2011  . Unintentional weight loss 12/30/2011  . Hx of adenomatous colonic polyps 12/30/2011  . Leucopenia 07/08/2007  . COPD (chronic obstructive pulmonary disease) ([place]) 12/21/2006  . LIVER FUNCTION TESTS, ABNORMAL 11/09/2006  . HYPERLIPIDEMIA 05/27/2006  . DISORDER, BIPOLAR NOS 05/27/2006  . TOBACCO ABUSE 05/27/2006  . Depression 05/27/2006  . CATARACT NOS 05/27/2006  . ALLERGIC RHINITIS 05/27/2006  . ASTHMA 05/27/2006  . GERD 05/27/2006  . ARTHRITIS 05/27/2006  . LOW BACK PAIN, CHRONIC 05/27/2006  . MALAISE AND FATIGUE 05/27/2006   Past Medical History:  Diagnosis Date  . Allergy   . Anxiety   . Arthritis   . Asthma   . Atherosclerosis   . Bipolar 1 disorder ([place])   . Cataract   . COPD (chronic obstructive pulmonary disease) ([place])   . Depression   . Elevated PSA   . ETOH  abuse    quit Oct 2013, then relapsed, as of Dec 2013 now has abstained X 1 month  . GERD (gastroesophageal reflux disease)   . Hyperlipidemia   . Hypertension   . Right hip pain 04/13/2019    Family History  Problem Relation Age of Onset  . Mental illness Sister   . Colon cancer Neg Hx     Past Surgical History:  Procedure Laterality Date  . BACK SURGERY     lunbar  . COLONOSCOPY  12/21/09   XQJ:JHER papilla otherwise normal/ pancolonic diverticula/mutiple colonic poylps  . COLONOSCOPY WITH ESOPHAGOGASTRODUODENOSCOPY (EGD) N/A 10/20/2012   Procedure: COLONOSCOPY WITH  ESOPHAGOGASTRODUODENOSCOPY (EGD);  Surgeon: Daneil Dolin, MD;  [facility];  Service: Endoscopy;  Laterality: N/A;  10;15  . CYSTOSCOPY N/A 01/31/2013   Procedure: CYSTOSCOPY FLEXIBLE;  Surgeon: Marissa Nestle, MD;  [facility];  Service: Urology;  Laterality: N/A;  I would like to do this around 1 pm on monday.   Marland Kitchen HARDWARE REMOVAL Left 06/07/2013   Procedure: HARDWARE REMOVAL;  Surgeon: Mcarthur Rossetti, MD;  Location: [facility];  Service: Orthopedics;  Laterality: Left;  . INTRAMEDULLARY (IM) NAIL INTERTROCHANTERIC Right 04/14/2019   Procedure: INTRAMEDULLARY (IM) NAIL INTERTROCHANTRIC;  Surgeon: Leandrew Koyanagi, MD;  Location: [facility];  Service: Orthopedics;  Laterality: Right;  . ORIF HIP FRACTURE Left 11/24/2012   Procedure: OPEN REDUCTION INTERNAL FIXATION HIP;  Surgeon: Sanjuana Kava, MD;  [facility];  Service: Orthopedics;  Laterality: Left;  . SPINE SURGERY    . TOTAL HIP ARTHROPLASTY Left 06/07/2013   Procedure: REMOVE HARDWARE LEFT HIP AND LEFT TOTAL HIP ARTHROPLASTY ANTERIOR APPROACH;  Surgeon: Mcarthur Rossetti, MD;  Location: [facility];  Service: Orthopedics;  Laterality: Left;  . TRANSURETHRAL RESECTION OF PROSTATE N/A 03/22/2013   Procedure: TRANSURETHRAL RESECTION OF THE PROSTATE (TURP);  Surgeon: Marissa Nestle, MD;  [facility];  Service: Urology;  Laterality: N/A;   Social History   Occupational History  . Occupation: disabled  Tobacco Use  . Smoking status: Current Every Day Smoker    Packs/day: 0.50    Years: 30.00    Pack years: 15.00    Types: Cigarettes  . Smokeless tobacco: Never Used  Substance and Sexual Activity  . Alcohol use: No    Alcohol/week: 30.0 standard drinks    Types: 30 Glasses of wine per week    Comment: history of  about a liter of wine per day-before that  . Drug use: Not Currently    Types: Marijuana, Cocaine    Comment: Per patient "years ago" 6/20  . Sexual activity: Yes    Birth control/protection: None

## 2019-06-07 ENCOUNTER — Other Ambulatory Visit: Payer: Self-pay

## 2019-06-07 ENCOUNTER — Ambulatory Visit (HOSPITAL_COMMUNITY): Payer: Medicare Other | Admitting: Physical Therapy

## 2019-06-07 ENCOUNTER — Encounter (HOSPITAL_COMMUNITY): Payer: Self-pay | Admitting: Physical Therapy

## 2019-06-07 DIAGNOSIS — R262 Difficulty in walking, not elsewhere classified: Secondary | ICD-10-CM

## 2019-06-07 DIAGNOSIS — M25551 Pain in right hip: Secondary | ICD-10-CM

## 2019-06-07 DIAGNOSIS — M6281 Muscle weakness (generalized): Secondary | ICD-10-CM

## 2019-06-07 DIAGNOSIS — R2681 Unsteadiness on feet: Secondary | ICD-10-CM

## 2019-06-07 NOTE — Therapy (Signed)
[place], [place], [address] Phone: [phone]   Fax:  [phone]  Physical Therapy Treatment  Patient Details  Name: Duane Richardson MRN: [id number] Date of Birth: [birth year] Referring Provider (PT): Shelly Coss, MD   Encounter Date: 06/07/2019  PT End of Session - 06/07/19 1041    Visit Number  3    Number of Visits  12    Date for PT Re-Evaluation  06/29/19    Authorization Type  Primary: Medicare Part B only; Secondary: Medicaid    Authorization Time Period  05/18/19 to 06/29/19    Authorization - Visit Number  3    Authorization - Number of Visits  10    Progress Note Due on Visit  10    PT Start Time  8527    PT Stop Time  1115    PT Time Calculation (min)  40 min    Equipment Utilized During Treatment  Gait belt    Activity Tolerance  Patient tolerated treatment well;Patient limited by fatigue    Behavior During Therapy  WFL for tasks assessed/performed       Past Medical History:  Diagnosis Date  . Allergy   . Anxiety   . Arthritis   . Asthma   . Atherosclerosis   . Bipolar 1 disorder ([place])   . Cataract   . COPD (chronic obstructive pulmonary disease) ([place])   . Depression   . Elevated PSA   . ETOH abuse    quit Oct 2013, then relapsed, as of Dec 2013 now has abstained X 1 month  . GERD (gastroesophageal reflux disease)   . Hyperlipidemia   . Hypertension   . Right hip pain 04/13/2019    Past Surgical History:  Procedure Laterality Date  . BACK SURGERY     lunbar  . COLONOSCOPY  12/21/09   Richardson:UMPN papilla otherwise normal/ pancolonic diverticula/mutiple colonic poylps  . COLONOSCOPY WITH ESOPHAGOGASTRODUODENOSCOPY (EGD) N/A 10/20/2012   Procedure: COLONOSCOPY WITH ESOPHAGOGASTRODUODENOSCOPY (EGD);  Surgeon: Daneil Dolin, MD;  [facility];  Service: Endoscopy;  Laterality: N/A;  10;15  . CYSTOSCOPY N/A 01/31/2013   Procedure: CYSTOSCOPY FLEXIBLE;  Surgeon: Marissa Nestle, MD;  Location: [facility];  Service: Urology;  Laterality: N/A;  I would like to do this around 1 pm on monday.   Marland Kitchen HARDWARE REMOVAL Left 06/07/2013   Procedure: HARDWARE REMOVAL;  Surgeon: Mcarthur Rossetti, MD;  Location: [facility];  Service: Orthopedics;  Laterality: Left;  . INTRAMEDULLARY (IM) NAIL INTERTROCHANTERIC Right 04/14/2019   Procedure: INTRAMEDULLARY (IM) NAIL INTERTROCHANTRIC;  Surgeon: Leandrew Koyanagi, MD;  Location: [facility];  Service: Orthopedics;  Laterality: Right;  . ORIF HIP FRACTURE Left 11/24/2012   Procedure: OPEN REDUCTION INTERNAL FIXATION HIP;  Surgeon: Sanjuana Kava, MD;  [facility];  Service: Orthopedics;  Laterality: Left;  . SPINE SURGERY    . TOTAL HIP ARTHROPLASTY Left 06/07/2013   Procedure: REMOVE HARDWARE LEFT HIP AND LEFT TOTAL HIP ARTHROPLASTY ANTERIOR APPROACH;  Surgeon: Mcarthur Rossetti, MD;  Location: [facility];  Service: Orthopedics;  Laterality: Left;  . TRANSURETHRAL RESECTION OF PROSTATE N/A 03/22/2013   Procedure: TRANSURETHRAL RESECTION OF THE PROSTATE (TURP);  Surgeon: Marissa Nestle, MD;  [facility];  Service: Urology;  Laterality: N/A;    There were no vitals filed for this visit.  Subjective Assessment - 06/07/19 1040    Subjective  Patient reports no new issues since last visit.  Says his hip has been bothering him lately. Patient denies any falls since last visit. Reports no pain in hip currently but says his RT knee was bothering him some.    Patient Stated Goals  to ride bicycle and lift weights    Currently in Pain?  No/denies                       [facility] Adult PT Treatment/Exercise - 06/07/19 0001      Knee/Hip Exercises: Standing   Hip Abduction  Both;20 reps;Knee straight    Abduction Limitations  cues for upright posturing     Forward Step Up  Both;10 reps;Hand Hold: 2;Step Height: 4"    Gait Training  226 feet with RW, cues for upright posture and step length     Other Standing Knee Exercises  tandem  stance 2 x 30" requires HHA x 1, cues for posture      Knee/Hip Exercises: Seated   Long Arc Quad  Both;20 reps    Other Seated Knee/Hip Exercises  heel/toe raises    Other Seated Knee/Hip Exercises  x20    Marching  Both;20 reps    Sit to Sand  1 set;10 reps;with UE support   from elevated seat on foam               PT Short Term Goals - 05/18/19 1353      PT SHORT TERM GOAL #1   Title  Pt will perform HEP at least 3x/week to improve functional strength needed for bed mobility, transfers, and ambulation.    Time  3    Period  Weeks    Status  New    Target Date  06/08/19      PT SHORT TERM GOAL #2   Title  Pt will ambulate with 0.4 m/s gait speed using RW to ambulate limited community distances safely and decrease risk for falls.    Time  3    Period  Weeks    Status  New        PT Long Term Goals - 05/18/19 1353      PT LONG TERM GOAL #1   Title  Pt will demo 4/5 BLE strength to improve standing tolerance and ambulation tolerance to allow pt to participate in cooking and light household chores.    Time  6    Period  Weeks    Status  New    Target Date  06/29/19      PT LONG TERM GOAL #2   Title  Pt will perform 5x STS in 30 sec or < to allow pt to rise from chair and navigate community safely.    Time  6    Period  Weeks    Status  New      PT LONG TERM GOAL #3   Title  Pt will ambulate with 0.8 m/s gait speed using RW to ambulate community distances safely and decrease risk for falls.    Time  6    Period  Weeks    Status  New      PT LONG TERM GOAL #4   Title  Pt will self report 1 or < falls since starting therapy demoing improved functional strength with ambulation and transfers.    Time  6    Period  Weeks    Status  New            Plan - 06/07/19 1115  Clinical Impression Statement  Patient tolerated session well today. Continues to be limited by some fatigue and LE weakness. Patient was able to progress to 4 inch step up, but demos  heavy use of UEs. Patient requires verbal cues for keeping upright posture with standing exercise and during gait training. Patient performs all exercise in a slow, controlled manner. Patient able to progress gait distance to 226 feet/ full lap around clinic with no increased complaint of pain. Patient still limited by gait abnormality including step to pattern, flexed trunk, decreased step length bilaterally.    Personal Factors and Comorbidities  Comorbidity 2;Education;Social Background    Comorbidities  HTN, arthritis    Examination-Activity Limitations  Bed Mobility;Locomotion Level;Stairs;Stand    Examination-Participation Restrictions  Cleaning;Community Activity;Laundry;Meal Prep;Yard Work    Stability/Clinical Decision Making  Stable/Uncomplicated    Rehab Potential  Fair    PT Frequency  2x / week    PT Duration  6 weeks    PT Treatment/Interventions  ADLs/Self Care Home Management;Aquatic Therapy;Cryotherapy;Moist Heat;DME Instruction;Gait training;Stair training;Functional mobility training;Therapeutic activities;Therapeutic exercise;Balance training;Neuromuscular re-education;Patient/family education;Orthotic Fit/Training;Wheelchair mobility training;Manual techniques;Scar mobilization;Passive range of motion;Taping;Joint Manipulations    PT Next Visit Plan  Next session add rockerboard, gait training to equal stride length, functional strengthening BLE and balance exercises.    PT Home Exercise Plan  Eval: seated heel raises, seated toe raises, LAQ, seated marching, seated hip abduction    Consulted and Agree with Plan of Care  Patient       Patient will benefit from skilled therapeutic intervention in order to improve the following deficits and impairments:  Abnormal gait, Decreased activity tolerance, Decreased balance, Decreased endurance, Decreased knowledge of use of DME, Decreased mobility, Decreased range of motion, Decreased strength, Difficulty walking, Hypomobility, Postural  dysfunction, Pain  Visit Diagnosis: Muscle weakness (generalized)  Difficulty in walking, not elsewhere classified  Unsteadiness on feet  Pain in right hip     Problem List Patient Active Problem List   Diagnosis Date Noted  . Other fracture of right femur, initial encounter for closed fracture ([place]) 04/13/2019  . Atherosclerosis   . Acute colitis 12/05/2015  . Syncope 12/05/2015  . Hypokalemia 12/05/2015  . Hypomagnesemia 12/05/2015  . Thrombocytopenia ([place]) 12/05/2015  . Alcohol withdrawal ([place]) 12/04/2015  . Generalized weakness 12/04/2015  . Abdominal pain 12/04/2015  . Nausea & vomiting 12/04/2015  . Arthritis of left hip 06/07/2013  . Status post THR (total hip replacement) 06/07/2013  . BPH (benign prostatic hypertrophy) with urinary obstruction 03/22/2013  . Protein-calorie malnutrition, severe ([place]) 11/30/2012  . Hip fracture ([place]) 11/23/2012  . HTN (hypertension) 11/23/2012  . Steatosis of liver 11/15/2012  . Anorexia nervosa 10/13/2012  . Loss of weight 07/16/2012  . ETOH abuse   . Weight loss 02/17/2012  . Dysphagia 12/30/2011  . Oral candida 12/30/2011  . Rectal bleed 12/30/2011  . Unintentional weight loss 12/30/2011  . Hx of adenomatous colonic polyps 12/30/2011  . Leucopenia 07/08/2007  . COPD (chronic obstructive pulmonary disease) ([place]) 12/21/2006  . LIVER FUNCTION TESTS, ABNORMAL 11/09/2006  . HYPERLIPIDEMIA 05/27/2006  . DISORDER, BIPOLAR NOS 05/27/2006  . TOBACCO ABUSE 05/27/2006  . Depression 05/27/2006  . CATARACT NOS 05/27/2006  . ALLERGIC RHINITIS 05/27/2006  . ASTHMA 05/27/2006  . GERD 05/27/2006  . ARTHRITIS 05/27/2006  . LOW BACK PAIN, CHRONIC 05/27/2006  . MALAISE AND FATIGUE 05/27/2006   12:54 PM, 06/07/19 Josue Hector PT DPT  Physical Therapist with [facility]  [phone]  [place], [place], [address] Phone: [phone]    Fax:  [phone]  Name: Maylon Sailors Parkinson MRN: [id number] Date of Birth: 27-Nov-1955

## 2019-06-13 ENCOUNTER — Telehealth (HOSPITAL_COMMUNITY): Payer: Self-pay | Admitting: Physical Therapy

## 2019-06-13 ENCOUNTER — Ambulatory Visit (HOSPITAL_COMMUNITY): Payer: Medicare Other | Admitting: Physical Therapy

## 2019-06-13 NOTE — Telephone Encounter (Signed)
pt cancelled appt because he has a bad cold

## 2019-06-16 ENCOUNTER — Encounter (HOSPITAL_COMMUNITY): Payer: Self-pay | Admitting: Physical Therapy

## 2019-06-16 ENCOUNTER — Other Ambulatory Visit: Payer: Self-pay

## 2019-06-16 ENCOUNTER — Ambulatory Visit (HOSPITAL_COMMUNITY): Payer: Medicare Other | Admitting: Physical Therapy

## 2019-06-16 DIAGNOSIS — M6281 Muscle weakness (generalized): Secondary | ICD-10-CM | POA: Diagnosis not present

## 2019-06-16 DIAGNOSIS — R2681 Unsteadiness on feet: Secondary | ICD-10-CM

## 2019-06-16 DIAGNOSIS — R262 Difficulty in walking, not elsewhere classified: Secondary | ICD-10-CM

## 2019-06-16 DIAGNOSIS — M25551 Pain in right hip: Secondary | ICD-10-CM

## 2019-06-16 NOTE — Therapy (Signed)
[place], [place], [address] Phone: [phone]   Fax:  [phone]  Physical Therapy Treatment  Patient Details  Name: Duane Richardson MRN: [id number] Date of Birth: Feb 23, 1956 Referring Provider (PT): Shelly Coss, MD   Encounter Date: 06/16/2019  PT End of Session - 06/16/19 1126    Visit Number  4    Number of Visits  12    Date for PT Re-Evaluation  06/29/19    Authorization Type  Primary: Medicare Part B only; Secondary: Medicaid    Authorization - Visit Number  4    Authorization - Number of Visits  10    Progress Note Due on Visit  10    PT Start Time  1120    PT Stop Time  1200    PT Time Calculation (min)  40 min    Equipment Utilized During Treatment  Gait belt    Activity Tolerance  Patient tolerated treatment well;Patient limited by fatigue    Behavior During Therapy  WFL for tasks assessed/performed       Past Medical History:  Diagnosis Date  . Allergy   . Anxiety   . Arthritis   . Asthma   . Atherosclerosis   . Bipolar 1 disorder ([place])   . Cataract   . COPD (chronic obstructive pulmonary disease) ([place])   . Depression   . Elevated PSA   . ETOH abuse    quit Oct 2013, then relapsed, as of Dec 2013 now has abstained X 1 month  . GERD (gastroesophageal reflux disease)   . Hyperlipidemia   . Hypertension   . Right hip pain 04/13/2019    Past Surgical History:  Procedure Laterality Date  . BACK SURGERY     lunbar  . COLONOSCOPY  12/21/09   RCV:ELFY papilla otherwise normal/ pancolonic diverticula/mutiple colonic poylps  . COLONOSCOPY WITH ESOPHAGOGASTRODUODENOSCOPY (EGD) N/A 10/20/2012   Procedure: COLONOSCOPY WITH ESOPHAGOGASTRODUODENOSCOPY (EGD);  Surgeon: Daneil Dolin, MD;  [facility];  Service: Endoscopy;  Laterality: N/A;  10;15  . CYSTOSCOPY N/A 01/31/2013   Procedure: CYSTOSCOPY FLEXIBLE;  Surgeon: Marissa Nestle, MD;  [facility];  Service: Urology;   Laterality: N/A;  I would like to do this around 1 pm on monday.   Marland Kitchen HARDWARE REMOVAL Left 06/07/2013   Procedure: HARDWARE REMOVAL;  Surgeon: Mcarthur Rossetti, MD;  Location: [facility];  Service: Orthopedics;  Laterality: Left;  . INTRAMEDULLARY (IM) NAIL INTERTROCHANTERIC Right 04/14/2019   Procedure: INTRAMEDULLARY (IM) NAIL INTERTROCHANTRIC;  Surgeon: Leandrew Koyanagi, MD;  Location: [facility];  Service: Orthopedics;  Laterality: Right;  . ORIF HIP FRACTURE Left 11/24/2012   Procedure: OPEN REDUCTION INTERNAL FIXATION HIP;  Surgeon: Sanjuana Kava, MD;  [facility];  Service: Orthopedics;  Laterality: Left;  . SPINE SURGERY    . TOTAL HIP ARTHROPLASTY Left 06/07/2013   Procedure: REMOVE HARDWARE LEFT HIP AND LEFT TOTAL HIP ARTHROPLASTY ANTERIOR APPROACH;  Surgeon: Mcarthur Rossetti, MD;  Location: [facility];  Service: Orthopedics;  Laterality: Left;  . TRANSURETHRAL RESECTION OF PROSTATE N/A 03/22/2013   Procedure: TRANSURETHRAL RESECTION OF THE PROSTATE (TURP);  Surgeon: Marissa Nestle, MD;  [facility];  Service: Urology;  Laterality: N/A;    There were no vitals filed for this visit.  Subjective Assessment - 06/16/19 1125    Subjective  Patient says he feels better from his cold earlier this wek but feels very weak. Says his RT  leg has been bothering him some, sore today about a 7.    Patient Stated Goals  to ride bicycle and lift weights    Currently in Pain?  Yes    Pain Score  7     Pain Location  Hip    Pain Orientation  Right    Pain Descriptors / Indicators  Aching    Pain Type  Acute pain                       [facility] Adult PT Treatment/Exercise - 06/16/19 0001      Knee/Hip Exercises: Standing   Heel Raises  Both;2 sets;10 reps    Hip Abduction  Both;20 reps;Knee straight    Abduction Limitations  cues for upright posturing     Forward Step Up  Both;10 reps;Hand Hold: 2;Step Height: 4"    Gait Training  226 feet with RW, cues for upright posture and  step length     Other Standing Knee Exercises  alternating step taps x20 4 inch box HHA x2      Knee/Hip Exercises: Seated   Long Arc Quad  Both;20 reps    Other Seated Knee/Hip Exercises  heel/toe raises    Other Seated Knee/Hip Exercises  x20    Marching  Both;20 reps    Sit to Sand  1 set;10 reps;with UE support   elevated on foam               PT Short Term Goals - 05/18/19 1353      PT SHORT TERM GOAL #1   Title  Pt will perform HEP at least 3x/week to improve functional strength needed for bed mobility, transfers, and ambulation.    Time  3    Period  Weeks    Status  New    Target Date  06/08/19      PT SHORT TERM GOAL #2   Title  Pt will ambulate with 0.4 m/s gait speed using RW to ambulate limited community distances safely and decrease risk for falls.    Time  3    Period  Weeks    Status  New        PT Long Term Goals - 05/18/19 1353      PT LONG TERM GOAL #1   Title  Pt will demo 4/5 BLE strength to improve standing tolerance and ambulation tolerance to allow pt to participate in cooking and light household chores.    Time  6    Period  Weeks    Status  New    Target Date  06/29/19      PT LONG TERM GOAL #2   Title  Pt will perform 5x STS in 30 sec or < to allow pt to rise from chair and navigate community safely.    Time  6    Period  Weeks    Status  New      PT LONG TERM GOAL #3   Title  Pt will ambulate with 0.8 m/s gait speed using RW to ambulate community distances safely and decrease risk for falls.    Time  6    Period  Weeks    Status  New      PT LONG TERM GOAL #4   Title  Pt will self report 1 or < falls since starting therapy demoing improved functional strength with ambulation and transfers.    Time  6    Period  Weeks    Status  New            Plan - 06/16/19 1256    Clinical Impression Statement  Activity graded per patient tolerance today. Patient appears somewhat lethargic and reports increased fatigue today.  Patient was able top complete most of prior activity but with increased rest breaks, Patient requires continued cueing for proper form and mechanics with hip strengthening exercise and for upright posture with standing activity and gait.    Personal Factors and Comorbidities  Comorbidity 2;Education;Social Background    Comorbidities  HTN, arthritis    Examination-Activity Limitations  Bed Mobility;Locomotion Level;Stairs;Stand    Examination-Participation Restrictions  Cleaning;Community Activity;Laundry;Meal Prep;Yard Work    Stability/Clinical Decision Making  Stable/Uncomplicated    Rehab Potential  Fair    PT Frequency  2x / week    PT Duration  6 weeks    PT Treatment/Interventions  ADLs/Self Care Home Management;Aquatic Therapy;Cryotherapy;Moist Heat;DME Instruction;Gait training;Stair training;Functional mobility training;Therapeutic activities;Therapeutic exercise;Balance training;Neuromuscular re-education;Patient/family education;Orthotic Fit/Training;Wheelchair mobility training;Manual techniques;Scar mobilization;Passive range of motion;Taping;Joint Manipulations    PT Next Visit Plan  Resume activity as tolerated. Next session add rockerboard, gait training to equal stride length, functional strengthening BLE and balance exercises.    PT Home Exercise Plan  Eval: seated heel raises, seated toe raises, LAQ, seated marching, seated hip abduction    Consulted and Agree with Plan of Care  Patient       Patient will benefit from skilled therapeutic intervention in order to improve the following deficits and impairments:  Abnormal gait, Decreased activity tolerance, Decreased balance, Decreased endurance, Decreased knowledge of use of DME, Decreased mobility, Decreased range of motion, Decreased strength, Difficulty walking, Hypomobility, Postural dysfunction, Pain  Visit Diagnosis: Muscle weakness (generalized)  Difficulty in walking, not elsewhere classified  Unsteadiness on  feet  Pain in right hip     Problem List Patient Active Problem List   Diagnosis Date Noted  . Other fracture of right femur, initial encounter for closed fracture ([place]) 04/13/2019  . Atherosclerosis   . Acute colitis 12/05/2015  . Syncope 12/05/2015  . Hypokalemia 12/05/2015  . Hypomagnesemia 12/05/2015  . Thrombocytopenia ([place]) 12/05/2015  . Alcohol withdrawal ([place]) 12/04/2015  . Generalized weakness 12/04/2015  . Abdominal pain 12/04/2015  . Nausea & vomiting 12/04/2015  . Arthritis of left hip 06/07/2013  . Status post THR (total hip replacement) 06/07/2013  . BPH (benign prostatic hypertrophy) with urinary obstruction 03/22/2013  . Protein-calorie malnutrition, severe ([place]) 11/30/2012  . Hip fracture ([place]) 11/23/2012  . HTN (hypertension) 11/23/2012  . Steatosis of liver 11/15/2012  . Anorexia nervosa 10/13/2012  . Loss of weight 07/16/2012  . ETOH abuse   . Weight loss 02/17/2012  . Dysphagia 12/30/2011  . Oral candida 12/30/2011  . Rectal bleed 12/30/2011  . Unintentional weight loss 12/30/2011  . Hx of adenomatous colonic polyps 12/30/2011  . Leucopenia 07/08/2007  . COPD (chronic obstructive pulmonary disease) ([place]) 12/21/2006  . LIVER FUNCTION TESTS, ABNORMAL 11/09/2006  . HYPERLIPIDEMIA 05/27/2006  . DISORDER, BIPOLAR NOS 05/27/2006  . TOBACCO ABUSE 05/27/2006  . Depression 05/27/2006  . CATARACT NOS 05/27/2006  . ALLERGIC RHINITIS 05/27/2006  . ASTHMA 05/27/2006  . GERD 05/27/2006  . ARTHRITIS 05/27/2006  . LOW BACK PAIN, CHRONIC 05/27/2006  . MALAISE AND FATIGUE 05/27/2006   1:01 PM, 06/16/19 Josue Hector PT DPT  Physical Therapist with [facility]  [phone]   [facility] [address]  [place], [place], [address] Phone: [phone]   Fax:  [phone]  Name: Duane Richardson MRN: [id number] Date of Birth: 1955-03-05

## 2019-06-20 ENCOUNTER — Ambulatory Visit (HOSPITAL_COMMUNITY): Payer: Medicare Other | Admitting: Physical Therapy

## 2019-06-20 ENCOUNTER — Telehealth (HOSPITAL_COMMUNITY): Payer: Self-pay | Admitting: Physical Therapy

## 2019-06-20 NOTE — Telephone Encounter (Signed)
Caregiver called to cx today- she said Duane Richardson had a rough weekend and he will be here on Thursday.

## 2019-06-23 ENCOUNTER — Telehealth (HOSPITAL_COMMUNITY): Payer: Self-pay | Admitting: Physical Therapy

## 2019-06-23 ENCOUNTER — Ambulatory Visit (HOSPITAL_COMMUNITY): Payer: Medicare Other | Admitting: Physical Therapy

## 2019-06-23 NOTE — Telephone Encounter (Signed)
pt's social worker called to cancel today's visit due to her dad is sick and she is at home with him and she is [place]'s transportation.

## 2019-06-26 ENCOUNTER — Emergency Department (HOSPITAL_COMMUNITY)
Admission: EM | Admit: 2019-06-26 | Discharge: 2019-06-27 | Disposition: A | Discharge status: Home / Self Care | Payer: Medicare Other | Attending: Emergency Medicine | Admitting: Emergency Medicine

## 2019-06-26 DIAGNOSIS — Z96642 Presence of left artificial hip joint: Secondary | ICD-10-CM | POA: Diagnosis not present

## 2019-06-26 DIAGNOSIS — J45909 Unspecified asthma, uncomplicated: Secondary | ICD-10-CM | POA: Insufficient documentation

## 2019-06-26 DIAGNOSIS — I2510 Atherosclerotic heart disease of native coronary artery without angina pectoris: Secondary | ICD-10-CM | POA: Diagnosis not present

## 2019-06-26 DIAGNOSIS — I10 Essential (primary) hypertension: Secondary | ICD-10-CM | POA: Diagnosis not present

## 2019-06-26 DIAGNOSIS — J449 Chronic obstructive pulmonary disease, unspecified: Secondary | ICD-10-CM | POA: Diagnosis not present

## 2019-06-26 DIAGNOSIS — M79662 Pain in left lower leg: Secondary | ICD-10-CM | POA: Insufficient documentation

## 2019-06-26 DIAGNOSIS — M79661 Pain in right lower leg: Secondary | ICD-10-CM | POA: Diagnosis present

## 2019-06-26 DIAGNOSIS — M79605 Pain in left leg: Secondary | ICD-10-CM

## 2019-06-26 DIAGNOSIS — F17210 Nicotine dependence, cigarettes, uncomplicated: Secondary | ICD-10-CM | POA: Insufficient documentation

## 2019-06-26 MED ORDER — ACETAMINOPHEN 500 MG PO TABS
1000.0000 mg | ORAL_TABLET | Freq: Once | ORAL | Status: AC
  Administered 2019-06-27: 1000 mg via ORAL
  Filled 2019-06-26: qty 2
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
## 2019-06-27 ENCOUNTER — Emergency Department (HOSPITAL_COMMUNITY): Payer: Medicare Other

## 2019-06-27 ENCOUNTER — Other Ambulatory Visit: Payer: Self-pay

## 2019-06-27 ENCOUNTER — Encounter (HOSPITAL_COMMUNITY): Payer: Self-pay | Admitting: *Deleted

## 2019-06-27 DIAGNOSIS — M79662 Pain in left lower leg: Secondary | ICD-10-CM | POA: Diagnosis not present
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
## 2019-06-27 NOTE — ED Notes (Signed)
Pt ambulated in room and around bed with a limp.

## 2019-06-28 ENCOUNTER — Ambulatory Visit (HOSPITAL_COMMUNITY): Payer: Medicare Other | Attending: Internal Medicine | Admitting: Physical Therapy

## 2019-06-30 ENCOUNTER — Telehealth (HOSPITAL_COMMUNITY): Payer: Self-pay | Admitting: Physical Therapy

## 2019-06-30 ENCOUNTER — Ambulatory Visit (HOSPITAL_COMMUNITY): Payer: Medicare Other | Admitting: Physical Therapy

## 2019-06-30 NOTE — Telephone Encounter (Signed)
pt cancelled appt for today because he is not feeling well

## 2019-07-02 ENCOUNTER — Emergency Department (HOSPITAL_COMMUNITY): Payer: Medicare Other

## 2019-07-02 ENCOUNTER — Encounter (HOSPITAL_COMMUNITY): Payer: Self-pay

## 2019-07-02 ENCOUNTER — Inpatient Hospital Stay (HOSPITAL_COMMUNITY)
Admission: EM | Admit: 2019-07-02 | Discharge: 2019-07-04 | DRG: 872 | Disposition: A | Discharge status: Home / Self Care | Payer: Medicare Other | Attending: Internal Medicine | Admitting: Internal Medicine

## 2019-07-02 DIAGNOSIS — I10 Essential (primary) hypertension: Secondary | ICD-10-CM | POA: Diagnosis present

## 2019-07-02 DIAGNOSIS — F10239 Alcohol dependence with withdrawal, unspecified: Secondary | ICD-10-CM | POA: Diagnosis present

## 2019-07-02 DIAGNOSIS — K219 Gastro-esophageal reflux disease without esophagitis: Secondary | ICD-10-CM | POA: Diagnosis not present

## 2019-07-02 DIAGNOSIS — Z20822 Contact with and (suspected) exposure to covid-19: Secondary | ICD-10-CM | POA: Diagnosis present

## 2019-07-02 DIAGNOSIS — Z7982 Long term (current) use of aspirin: Secondary | ICD-10-CM | POA: Diagnosis not present

## 2019-07-02 DIAGNOSIS — Y905 Blood alcohol level of 100-119 mg/100 ml: Secondary | ICD-10-CM | POA: Diagnosis present

## 2019-07-02 DIAGNOSIS — J449 Chronic obstructive pulmonary disease, unspecified: Secondary | ICD-10-CM | POA: Diagnosis present

## 2019-07-02 DIAGNOSIS — D72829 Elevated white blood cell count, unspecified: Secondary | ICD-10-CM

## 2019-07-02 DIAGNOSIS — Z7141 Alcohol abuse counseling and surveillance of alcoholic: Secondary | ICD-10-CM

## 2019-07-02 DIAGNOSIS — N3000 Acute cystitis without hematuria: Secondary | ICD-10-CM | POA: Diagnosis present

## 2019-07-02 DIAGNOSIS — E876 Hypokalemia: Secondary | ICD-10-CM | POA: Diagnosis not present

## 2019-07-02 DIAGNOSIS — N400 Enlarged prostate without lower urinary tract symptoms: Secondary | ICD-10-CM | POA: Diagnosis present

## 2019-07-02 DIAGNOSIS — E785 Hyperlipidemia, unspecified: Secondary | ICD-10-CM | POA: Diagnosis present

## 2019-07-02 DIAGNOSIS — F1020 Alcohol dependence, uncomplicated: Secondary | ICD-10-CM | POA: Diagnosis present

## 2019-07-02 DIAGNOSIS — Z96642 Presence of left artificial hip joint: Secondary | ICD-10-CM | POA: Diagnosis present

## 2019-07-02 DIAGNOSIS — A419 Sepsis, unspecified organism: Secondary | ICD-10-CM

## 2019-07-02 DIAGNOSIS — Z818 Family history of other mental and behavioral disorders: Secondary | ICD-10-CM

## 2019-07-02 DIAGNOSIS — F319 Bipolar disorder, unspecified: Secondary | ICD-10-CM | POA: Diagnosis not present

## 2019-07-02 DIAGNOSIS — F17210 Nicotine dependence, cigarettes, uncomplicated: Secondary | ICD-10-CM | POA: Diagnosis present

## 2019-07-02 DIAGNOSIS — J302 Other seasonal allergic rhinitis: Secondary | ICD-10-CM | POA: Diagnosis present

## 2019-07-02 DIAGNOSIS — A4150 Gram-negative sepsis, unspecified: Principal | ICD-10-CM | POA: Diagnosis present

## 2019-07-02 DIAGNOSIS — Z79899 Other long term (current) drug therapy: Secondary | ICD-10-CM

## 2019-07-02 DIAGNOSIS — N390 Urinary tract infection, site not specified: Secondary | ICD-10-CM | POA: Diagnosis present

## 2019-07-02 DIAGNOSIS — M25552 Pain in left hip: Secondary | ICD-10-CM | POA: Diagnosis not present

## 2019-07-02 DIAGNOSIS — G8929 Other chronic pain: Secondary | ICD-10-CM | POA: Diagnosis not present

## 2019-07-02 DIAGNOSIS — Z9079 Acquired absence of other genital organ(s): Secondary | ICD-10-CM | POA: Diagnosis not present

## 2019-07-02 DIAGNOSIS — M545 Low back pain, unspecified: Secondary | ICD-10-CM | POA: Diagnosis present

## 2019-07-02 DIAGNOSIS — F17200 Nicotine dependence, unspecified, uncomplicated: Secondary | ICD-10-CM | POA: Diagnosis present

## 2019-07-02 DIAGNOSIS — Z7952 Long term (current) use of systemic steroids: Secondary | ICD-10-CM | POA: Diagnosis not present

## 2019-07-02 DIAGNOSIS — W19XXXA Unspecified fall, initial encounter: Secondary | ICD-10-CM

## 2019-07-02 DIAGNOSIS — F10939 Alcohol use, unspecified with withdrawal, unspecified: Secondary | ICD-10-CM | POA: Diagnosis present

## 2019-07-02 LAB — CBC WITH DIFFERENTIAL/PLATELET
Abs Immature Granulocytes: 0.37 10*3/uL — ABNORMAL HIGH (ref 0.00–0.07)
Basophils Absolute: 0.1 10*3/uL (ref 0.0–0.1)
Basophils Relative: 0 %
Eosinophils Absolute: 0 10*3/uL (ref 0.0–0.5)
Eosinophils Relative: 0 %
HCT: 44 % (ref 39.0–52.0)
Hemoglobin: 14.7 g/dL (ref 13.0–17.0)
Immature Granulocytes: 1 %
Lymphocytes Relative: 4 %
Lymphs Abs: 1.1 10*3/uL (ref 0.7–4.0)
MCH: 30.4 pg (ref 26.0–34.0)
MCHC: 33.4 g/dL (ref 30.0–36.0)
MCV: 91.1 fL (ref 80.0–100.0)
Monocytes Absolute: 2.8 10*3/uL — ABNORMAL HIGH (ref 0.1–1.0)
Monocytes Relative: 11 %
Neutro Abs: 22.1 10*3/uL — ABNORMAL HIGH (ref 1.7–7.7)
Neutrophils Relative %: 84 %
Platelets: 169 10*3/uL (ref 150–400)
RBC: 4.83 MIL/uL (ref 4.22–5.81)
RDW: 15.3 % (ref 11.5–15.5)
WBC: 26.5 10*3/uL — ABNORMAL HIGH (ref 4.0–10.5)
nRBC: 0 % (ref 0.0–0.2)

## 2019-07-02 LAB — URINALYSIS, ROUTINE W REFLEX MICROSCOPIC
Glucose, UA: NEGATIVE mg/dL
Hgb urine dipstick: NEGATIVE
Ketones, ur: NEGATIVE mg/dL
Nitrite: NEGATIVE
Protein, ur: 100 mg/dL — AB
Specific Gravity, Urine: 1.024 (ref 1.005–1.030)
pH: 5 (ref 5.0–8.0)

## 2019-07-02 LAB — COMPREHENSIVE METABOLIC PANEL
ALT: 10 U/L (ref 0–44)
AST: 16 U/L (ref 15–41)
Albumin: 3 g/dL — ABNORMAL LOW (ref 3.5–5.0)
Alkaline Phosphatase: 136 U/L — ABNORMAL HIGH (ref 38–126)
Anion gap: 9 (ref 5–15)
BUN: 11 mg/dL (ref 8–23)
CO2: 24 mmol/L (ref 22–32)
Calcium: 8 mg/dL — ABNORMAL LOW (ref 8.9–10.3)
Chloride: 96 mmol/L — ABNORMAL LOW (ref 98–111)
Creatinine, Ser: 0.85 mg/dL (ref 0.61–1.24)
GFR calc Af Amer: >60 mL/min (ref >60)
GFR calc non Af Amer: >60 mL/min (ref >60)
Glucose, Bld: 118 mg/dL — ABNORMAL HIGH (ref 70–99)
Potassium: 3.3 mmol/L — ABNORMAL LOW (ref 3.5–5.1)
Sodium: 129 mmol/L — ABNORMAL LOW (ref 135–145)
Total Bilirubin: 1 mg/dL (ref 0.3–1.2)
Total Protein: 6.8 g/dL (ref 6.5–8.1)

## 2019-07-02 LAB — RAPID URINE DRUG SCREEN, HOSP PERFORMED
Amphetamines: NOT DETECTED
Barbiturates: NOT DETECTED
Benzodiazepines: NOT DETECTED
Cocaine: NOT DETECTED
Opiates: NOT DETECTED
Tetrahydrocannabinol: NOT DETECTED

## 2019-07-02 LAB — LACTIC ACID, PLASMA
Lactic Acid, Venous: 3.1 mmol/L (ref 0.5–1.9)
Lactic Acid, Venous: 3.6 mmol/L (ref 0.5–1.9)

## 2019-07-02 LAB — PROTIME-INR
INR: 1.1 (ref 0.8–1.2)
Prothrombin Time: 14.1 seconds (ref 11.4–15.2)

## 2019-07-02 LAB — ETHANOL: Alcohol, Ethyl (B): 102 mg/dL — ABNORMAL HIGH (ref <10)

## 2019-07-02 LAB — APTT: aPTT: 41 seconds — ABNORMAL HIGH (ref 24–36)

## 2019-07-02 MED ORDER — SODIUM CHLORIDE 0.9 % IV BOLUS
1000.0000 mL | Freq: Once | INTRAVENOUS | Status: AC
  Administered 2019-07-02: 1000 mL via INTRAVENOUS

## 2019-07-02 MED ORDER — SODIUM CHLORIDE 0.9 % IV SOLN
1.0000 g | Freq: Once | INTRAVENOUS | Status: AC
  Administered 2019-07-02: 1 g via INTRAVENOUS
  Filled 2019-07-02: qty 10

## 2019-07-02 NOTE — ED Notes (Signed)
Call from lab   Critical value   Lactic acid of 3.1  EDP, PA, and Caity, RN informed

## 2019-07-02 NOTE — ED Triage Notes (Signed)
Pt is here for being generally weak. He has been drinking today and drinks daily. Pt is able to communicate well. Pt has a pungent smell of urine.
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
## 2019-07-03 ENCOUNTER — Encounter (HOSPITAL_COMMUNITY): Payer: Self-pay | Admitting: Internal Medicine

## 2019-07-03 ENCOUNTER — Other Ambulatory Visit: Payer: Self-pay

## 2019-07-03 DIAGNOSIS — Z20822 Contact with and (suspected) exposure to covid-19: Secondary | ICD-10-CM | POA: Diagnosis not present

## 2019-07-03 DIAGNOSIS — A4150 Gram-negative sepsis, unspecified: Secondary | ICD-10-CM | POA: Diagnosis present

## 2019-07-03 DIAGNOSIS — E785 Hyperlipidemia, unspecified: Secondary | ICD-10-CM | POA: Diagnosis not present

## 2019-07-03 DIAGNOSIS — J302 Other seasonal allergic rhinitis: Secondary | ICD-10-CM | POA: Diagnosis not present

## 2019-07-03 DIAGNOSIS — Y905 Blood alcohol level of 100-119 mg/100 ml: Secondary | ICD-10-CM | POA: Diagnosis not present

## 2019-07-03 DIAGNOSIS — N390 Urinary tract infection, site not specified: Secondary | ICD-10-CM

## 2019-07-03 DIAGNOSIS — E876 Hypokalemia: Secondary | ICD-10-CM | POA: Diagnosis not present

## 2019-07-03 DIAGNOSIS — Z96642 Presence of left artificial hip joint: Secondary | ICD-10-CM | POA: Diagnosis not present

## 2019-07-03 DIAGNOSIS — F17210 Nicotine dependence, cigarettes, uncomplicated: Secondary | ICD-10-CM | POA: Diagnosis not present

## 2019-07-03 DIAGNOSIS — Z7952 Long term (current) use of systemic steroids: Secondary | ICD-10-CM | POA: Diagnosis not present

## 2019-07-03 DIAGNOSIS — F319 Bipolar disorder, unspecified: Secondary | ICD-10-CM | POA: Diagnosis not present

## 2019-07-03 DIAGNOSIS — Z79899 Other long term (current) drug therapy: Secondary | ICD-10-CM | POA: Diagnosis not present

## 2019-07-03 DIAGNOSIS — Z9079 Acquired absence of other genital organ(s): Secondary | ICD-10-CM | POA: Diagnosis not present

## 2019-07-03 DIAGNOSIS — F1020 Alcohol dependence, uncomplicated: Secondary | ICD-10-CM | POA: Diagnosis not present

## 2019-07-03 DIAGNOSIS — A419 Sepsis, unspecified organism: Secondary | ICD-10-CM | POA: Diagnosis not present

## 2019-07-03 DIAGNOSIS — N400 Enlarged prostate without lower urinary tract symptoms: Secondary | ICD-10-CM | POA: Diagnosis not present

## 2019-07-03 DIAGNOSIS — M25552 Pain in left hip: Secondary | ICD-10-CM | POA: Diagnosis not present

## 2019-07-03 DIAGNOSIS — I10 Essential (primary) hypertension: Secondary | ICD-10-CM | POA: Diagnosis not present

## 2019-07-03 DIAGNOSIS — Z818 Family history of other mental and behavioral disorders: Secondary | ICD-10-CM | POA: Diagnosis not present

## 2019-07-03 DIAGNOSIS — Z7982 Long term (current) use of aspirin: Secondary | ICD-10-CM | POA: Diagnosis not present

## 2019-07-03 DIAGNOSIS — G8929 Other chronic pain: Secondary | ICD-10-CM | POA: Diagnosis not present

## 2019-07-03 DIAGNOSIS — K219 Gastro-esophageal reflux disease without esophagitis: Secondary | ICD-10-CM | POA: Diagnosis not present

## 2019-07-03 DIAGNOSIS — N3000 Acute cystitis without hematuria: Secondary | ICD-10-CM | POA: Diagnosis not present

## 2019-07-03 DIAGNOSIS — J449 Chronic obstructive pulmonary disease, unspecified: Secondary | ICD-10-CM | POA: Diagnosis not present

## 2019-07-03 DIAGNOSIS — Z7141 Alcohol abuse counseling and surveillance of alcoholic: Secondary | ICD-10-CM | POA: Diagnosis not present

## 2019-07-03 LAB — COMPREHENSIVE METABOLIC PANEL
ALT: 9 U/L (ref 0–44)
AST: 18 U/L (ref 15–41)
Albumin: 2.5 g/dL — ABNORMAL LOW (ref 3.5–5.0)
Alkaline Phosphatase: 121 U/L (ref 38–126)
Anion gap: 8 (ref 5–15)
BUN: 11 mg/dL (ref 8–23)
CO2: 23 mmol/L (ref 22–32)
Calcium: 7.5 mg/dL — ABNORMAL LOW (ref 8.9–10.3)
Chloride: 102 mmol/L (ref 98–111)
Creatinine, Ser: 0.61 mg/dL (ref 0.61–1.24)
GFR calc Af Amer: >60 mL/min (ref >60)
GFR calc non Af Amer: >60 mL/min (ref >60)
Glucose, Bld: 110 mg/dL — ABNORMAL HIGH (ref 70–99)
Potassium: 3.3 mmol/L — ABNORMAL LOW (ref 3.5–5.1)
Sodium: 133 mmol/L — ABNORMAL LOW (ref 135–145)
Total Bilirubin: 0.8 mg/dL (ref 0.3–1.2)
Total Protein: 5.8 g/dL — ABNORMAL LOW (ref 6.5–8.1)

## 2019-07-03 LAB — RESPIRATORY PANEL BY RT PCR (FLU A&B, COVID)
Influenza A by PCR: NEGATIVE
Influenza B by PCR: NEGATIVE
SARS Coronavirus 2 by RT PCR: NEGATIVE

## 2019-07-03 LAB — PHOSPHORUS: Phosphorus: 2.7 mg/dL (ref 2.5–4.6)

## 2019-07-03 LAB — MAGNESIUM: Magnesium: 1.8 mg/dL (ref 1.7–2.4)

## 2019-07-03 LAB — CBC
HCT: 39.1 % (ref 39.0–52.0)
Hemoglobin: 12.5 g/dL — ABNORMAL LOW (ref 13.0–17.0)
MCH: 29.5 pg (ref 26.0–34.0)
MCHC: 32 g/dL (ref 30.0–36.0)
MCV: 92.2 fL (ref 80.0–100.0)
Platelets: 140 10*3/uL — ABNORMAL LOW (ref 150–400)
RBC: 4.24 MIL/uL (ref 4.22–5.81)
RDW: 15.6 % — ABNORMAL HIGH (ref 11.5–15.5)
WBC: 24.1 10*3/uL — ABNORMAL HIGH (ref 4.0–10.5)
nRBC: 0 % (ref 0.0–0.2)

## 2019-07-03 LAB — LACTIC ACID, PLASMA: Lactic Acid, Venous: 0.9 mmol/L (ref 0.5–1.9)

## 2019-07-03 MED ORDER — ALBUTEROL SULFATE HFA 108 (90 BASE) MCG/ACT IN AERS: 2.0000 | INHALATION_SPRAY | RESPIRATORY_TRACT | Status: DC | PRN

## 2019-07-03 MED ORDER — ONDANSETRON HCL 4 MG PO TABS: 4.0000 mg | ORAL_TABLET | Freq: Four times a day (QID) | ORAL | Status: DC | PRN

## 2019-07-03 MED ORDER — LORAZEPAM 1 MG PO TABS
1.0000 mg | ORAL_TABLET | ORAL | Status: DC | PRN
  Administered 2019-07-03: 1 mg via ORAL
  Filled 2019-07-03: qty 1

## 2019-07-03 MED ORDER — LORAZEPAM 2 MG/ML IJ SOLN
0.0000 mg | Freq: Four times a day (QID) | INTRAMUSCULAR | Status: DC
  Administered 2019-07-03: 1 mg via INTRAVENOUS
  Administered 2019-07-04 (×2): 2 mg via INTRAVENOUS
  Filled 2019-07-03 (×3): qty 1

## 2019-07-03 MED ORDER — ACETAMINOPHEN 325 MG PO TABS: 650.0000 mg | ORAL_TABLET | Freq: Four times a day (QID) | ORAL | Status: DC | PRN

## 2019-07-03 MED ORDER — FOLIC ACID 1 MG PO TABS
1.0000 mg | ORAL_TABLET | Freq: Every day | ORAL | Status: DC
  Administered 2019-07-03 – 2019-07-04 (×2): 1 mg via ORAL
  Filled 2019-07-03 (×2): qty 1

## 2019-07-03 MED ORDER — POTASSIUM CHLORIDE CRYS ER 20 MEQ PO TBCR
40.0000 meq | EXTENDED_RELEASE_TABLET | Freq: Once | ORAL | Status: AC
  Administered 2019-07-03: 40 meq via ORAL
  Filled 2019-07-03: qty 2

## 2019-07-03 MED ORDER — THIAMINE HCL 100 MG PO TABS
100.0000 mg | ORAL_TABLET | Freq: Every day | ORAL | Status: DC
  Administered 2019-07-03 – 2019-07-04 (×2): 100 mg via ORAL
  Filled 2019-07-03 (×2): qty 1

## 2019-07-03 MED ORDER — OXYCODONE-ACETAMINOPHEN 5-325 MG PO TABS: 1.0000 | ORAL_TABLET | Freq: Three times a day (TID) | ORAL | Status: DC | PRN

## 2019-07-03 MED ORDER — ALBUTEROL SULFATE (2.5 MG/3ML) 0.083% IN NEBU: 2.5000 mg | INHALATION_SOLUTION | RESPIRATORY_TRACT | Status: DC | PRN

## 2019-07-03 MED ORDER — POLYVINYL ALCOHOL 1.4 % OP SOLN
1.0000 [drp] | Freq: Two times a day (BID) | OPHTHALMIC | Status: DC | PRN
  Filled 2019-07-03: qty 15

## 2019-07-03 MED ORDER — ADULT MULTIVITAMIN W/MINERALS CH
1.0000 | ORAL_TABLET | Freq: Every day | ORAL | Status: DC
  Administered 2019-07-03 – 2019-07-04 (×2): 1 via ORAL
  Filled 2019-07-03 (×2): qty 1

## 2019-07-03 MED ORDER — SODIUM CHLORIDE 0.9 % IV SOLN
1.0000 g | INTRAVENOUS | Status: DC
  Administered 2019-07-03: 1 g via INTRAVENOUS
  Filled 2019-07-03: qty 10

## 2019-07-03 MED ORDER — ACETAMINOPHEN 650 MG RE SUPP: 650.0000 mg | Freq: Four times a day (QID) | RECTAL | Status: DC | PRN

## 2019-07-03 MED ORDER — SODIUM CHLORIDE 0.9 % IV SOLN: INTRAVENOUS | Status: DC

## 2019-07-03 MED ORDER — ENOXAPARIN SODIUM 40 MG/0.4ML ~~LOC~~ SOLN
40.0000 mg | Freq: Every day | SUBCUTANEOUS | Status: DC
  Administered 2019-07-03: 40 mg via SUBCUTANEOUS
  Filled 2019-07-03: qty 0.4

## 2019-07-03 MED ORDER — AMLODIPINE BESYLATE 5 MG PO TABS
10.0000 mg | ORAL_TABLET | Freq: Every day | ORAL | Status: DC
  Administered 2019-07-03 – 2019-07-04 (×2): 10 mg via ORAL
  Filled 2019-07-03 (×2): qty 2

## 2019-07-03 MED ORDER — LORAZEPAM 2 MG/ML IJ SOLN
1.0000 mg | INTRAMUSCULAR | Status: DC | PRN
  Administered 2019-07-03: 2 mg via INTRAVENOUS
  Filled 2019-07-03: qty 1

## 2019-07-03 MED ORDER — POLYETHYLENE GLYCOL 3350 17 G PO PACK
17.0000 g | PACK | Freq: Two times a day (BID) | ORAL | Status: DC
  Administered 2019-07-03: 17 g via ORAL
  Filled 2019-07-03 (×3): qty 1

## 2019-07-03 MED ORDER — MOMETASONE FURO-FORMOTEROL FUM 200-5 MCG/ACT IN AERO
2.0000 | INHALATION_SPRAY | Freq: Two times a day (BID) | RESPIRATORY_TRACT | Status: DC
  Administered 2019-07-03 – 2019-07-04 (×3): 2 via RESPIRATORY_TRACT
  Filled 2019-07-03: qty 8.8

## 2019-07-03 MED ORDER — LORATADINE 10 MG PO TABS
10.0000 mg | ORAL_TABLET | Freq: Every day | ORAL | Status: DC
  Administered 2019-07-03 – 2019-07-04 (×2): 10 mg via ORAL
  Filled 2019-07-03 (×2): qty 1

## 2019-07-03 MED ORDER — DOCUSATE SODIUM 100 MG PO CAPS
100.0000 mg | ORAL_CAPSULE | Freq: Two times a day (BID) | ORAL | Status: DC
  Administered 2019-07-03 – 2019-07-04 (×3): 100 mg via ORAL
  Filled 2019-07-03 (×3): qty 1

## 2019-07-03 MED ORDER — LORAZEPAM 2 MG/ML IJ SOLN: 0.0000 mg | Freq: Two times a day (BID) | INTRAMUSCULAR | Status: DC

## 2019-07-03 MED ORDER — THIAMINE HCL 100 MG/ML IJ SOLN
100.0000 mg | Freq: Every day | INTRAMUSCULAR | Status: DC
  Filled 2019-07-03: qty 2

## 2019-07-03 MED ORDER — ONDANSETRON HCL 4 MG/2ML IJ SOLN: 4.0000 mg | Freq: Four times a day (QID) | INTRAMUSCULAR | Status: DC | PRN
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
## 2019-07-03 NOTE — H&P (Signed)
History and Physical    Duane Richardson YQM:[id number] DOB: [birth year] DOA: 07/02/2019  PCP: [name], MD (Confirm with patient/family/NH records and if not entered, this has to be entered at [facility] point of entry) Patient coming from: Home  I have personally briefly reviewed patient's old medical records in [place]  Chief Complaint: Generalized weakness and urinary frequency.  HPI: Duane Richardson is a 64 y.o. male with medical history significant of tension, hyperlipidemia, COPD, GERD, chronic alcoholism and tobacco dependence presented to ED for evaluation of severe generalized weakness and urinary frequency.  Patient states that he is feeling very weak for the last 2 days and having increased urinary frequency with foul-smelling urine.  Patient further mentioned that he had several beers with his meal at cookout and while going back he fell off his scooter and landed on his left leg that resulted in pain in left hip and left leg.  Patient is having a little bit slurred speech but able to answer all the questions appropriately and denied fever, chills, headache, dizziness, chest pain, shortness of breath, abdominal pain and anxiety.  ED Course: On arrival to the ED patient had temperature of 100.4, blood pressure 102/72, heart rate 109, respiratory rate 25 and oxygen saturation 99% on room air.  Blood work showed leukocytosis with WBC count of 26.5, hemoglobin 14.7, sodium 129, potassium 3.3, BUN 11, creatinine 0.85, blood glucose 118.  UA positive for UTI alcohol level 102 and lactic acid 3.1.  Chest x-ray was negative for acute cardiopulmonary pathology.  Left femur x-ray was done that showed stable hypertropic ossification surrounding the left hip arthroplasty and there was no acute abnormalities.  Patient was meeting the sepsis criteria secondary to UTI in the ED and was given IV fluids and IV ceftriaxone in the ED.  Review of Systems: As per HPI otherwise 10 point review of systems  negative.  Unacceptable ROS statements: "10 systems reviewed," "Extensive" (without elaboration).  Acceptable ROS statements: "All others negative," "All others reviewed and are negative," and "All others unremarkable," with at [place] documented Can't double dip - if using for HPI can't use for ROS  Past Medical History:  Diagnosis Date  . Allergy   . Anxiety   . Arthritis   . Asthma   . Atherosclerosis   . Bipolar 1 disorder ([place])   . Cataract   . COPD (chronic obstructive pulmonary disease) ([place])   . Depression   . Elevated PSA   . ETOH abuse    quit Oct 2013, then relapsed, as of Dec 2013 now has abstained X 1 month  . GERD (gastroesophageal reflux disease)   . Hyperlipidemia   . Hypertension   . Right hip pain 04/13/2019    Past Surgical History:  Procedure Laterality Date  . BACK SURGERY     lunbar  . COLONOSCOPY  12/21/09   BMW:UXLK papilla otherwise normal/ pancolonic diverticula/mutiple colonic poylps  . COLONOSCOPY WITH ESOPHAGOGASTRODUODENOSCOPY (EGD) N/A 10/20/2012   Procedure: COLONOSCOPY WITH ESOPHAGOGASTRODUODENOSCOPY (EGD);  Surgeon: Daneil Dolin, MD;  [facility];  Service: Endoscopy;  Laterality: N/A;  10;15  . CYSTOSCOPY N/A 01/31/2013   Procedure: CYSTOSCOPY FLEXIBLE;  Surgeon: Marissa Nestle, MD;  Location: [facility];  Service: Urology;  Laterality: N/A;  I would like to do this around 1 pm on monday.   Marland Kitchen HARDWARE REMOVAL Left 06/07/2013   Procedure: HARDWARE REMOVAL;  Surgeon: Mcarthur Rossetti, MD;  Location: [place];  Service:  Orthopedics;  Laterality: Left;  . INTRAMEDULLARY (IM) NAIL INTERTROCHANTERIC Right 04/14/2019   Procedure: INTRAMEDULLARY (IM) NAIL INTERTROCHANTRIC;  Surgeon: Leandrew Koyanagi, MD;  Location: [place];  Service: Orthopedics;  Laterality: Right;  . ORIF HIP FRACTURE Left 11/24/2012   Procedure: OPEN REDUCTION INTERNAL FIXATION HIP;  Surgeon: Sanjuana Kava, MD;  Location: [facility];  Service: Orthopedics;  Laterality:  Left;  . SPINE SURGERY    . TOTAL HIP ARTHROPLASTY Left 06/07/2013   Procedure: REMOVE HARDWARE LEFT HIP AND LEFT TOTAL HIP ARTHROPLASTY ANTERIOR APPROACH;  Surgeon: Mcarthur Rossetti, MD;  Location: [place];  Service: Orthopedics;  Laterality: Left;  . TRANSURETHRAL RESECTION OF PROSTATE N/A 03/22/2013   Procedure: TRANSURETHRAL RESECTION OF THE PROSTATE (TURP);  Surgeon: Marissa Nestle, MD;  Location: [facility];  Service: Urology;  Laterality: N/A;     reports that he has been smoking cigarettes. He has a 15.00 pack-year smoking history. He has never used smokeless tobacco. He reports previous drug use. Drugs: Marijuana and Cocaine. He reports that he does not drink alcohol.  No Known Allergies  Family History  Problem Relation Age of Onset  . Mental illness Sister   . Colon cancer Neg Hx     Unacceptable: Noncontributory, unremarkable, or negative. Acceptable: (example)Family history negative for heart disease  Prior to Admission medications   Medication Sig Start Date End Date Taking? Authorizing Provider  ALPRAZolam Duanne Moron) 0.5 MG tablet Take 1 tablet (0.5 mg total) by mouth 3 (three) times daily as needed. 05/05/19   Shelly Coss, MD  amLODipine (NORVASC) 10 MG tablet TAKE ONE TABLET BY MOUTH ONCE DAILY. Patient taking differently: Take 10 mg by mouth daily.  08/05/16   Susy Frizzle, MD  aspirin 325 MG EC tablet Take 1 tablet (325 mg total) by mouth daily. 05/05/19   Shelly Coss, MD  Carboxymethylcellul-Glycerin (CLEAR EYES FOR DRY EYES) 1-0.25 % SOLN Apply 1-2 drops to eye 2 (two) times daily as needed.    [provider]  docusate sodium (COLACE) 100 MG capsule Take 1 capsule (100 mg total) by mouth 2 (two) times daily. 05/05/19   Shelly Coss, MD  folic acid (FOLVITE) 1 MG tablet Take 1 tablet (1 mg total) by mouth daily. 05/05/19   Shelly Coss, MD  loratadine (CLARITIN) 10 MG tablet Take 10 mg by mouth daily. 02/08/19   [provider]    oxyCODONE-acetaminophen (PERCOCET) 5-325 MG tablet Take 1-2 tablets by mouth every 8 (eight) hours as needed for severe pain. 04/14/19   Leandrew Koyanagi, MD  polyethylene glycol (MIRALAX / GLYCOLAX) 17 g packet Take 17 g by mouth 2 (two) times daily. 05/05/19   Shelly Coss, MD  PROAIR HFA 108 (90 Base) MCG/ACT inhaler INHALE 2 PUFFS INTO THE LUNGS EVERY 4 TO 6 HOURS AS NEEDED. Patient taking differently: Inhale 2 puffs into the lungs every 4 (four) hours as needed for wheezing or shortness of breath.  08/28/16   Susy Frizzle, MD  SYMBICORT 160-4.5 MCG/ACT inhaler Inhale 1 puff into the lungs 2 (two) times daily. 02/08/19   [provider]  thiamine 100 MG tablet Take 1 tablet (100 mg total) by mouth daily. 05/05/19   Shelly Coss, MD  vitamin B-12 1000 MCG tablet Take 1 tablet (1,000 mcg total) by mouth daily. 05/05/19   Shelly Coss, MD    Physical Exam: Vitals:   07/03/19 0130 07/03/19 0200 07/03/19 0230 07/03/19 0300  BP: 113/77 103/73 127/83 129/83  Pulse: 97 96  96 93  Resp: 17 18 16 18   Temp:      TempSrc:      SpO2: 97% 97% 98% 98%  Weight:        Constitutional: Patient is having mildly slurred speech but awake alert and oriented and not in acute distress Eyes: PERRL, lids and conjunctivae normal ENMT: Mucous membranes are moist. Posterior pharynx clear of any exudate or lesions.Normal dentition.  Neck: normal, supple, no masses, no thyromegaly Respiratory: clear to auscultation bilaterally, no wheezing, no crackles. Normal respiratory effort. No accessory muscle use.  Cardiovascular: Regular rate and rhythm, no murmurs / rubs / gallops. No extremity edema. 2+ pedal pulses. No carotid bruits.  Abdomen: no tenderness, no masses palpated. No hepatosplenomegaly. Bowel sounds positive.  Musculoskeletal: no clubbing / cyanosis. No joint deformity upper and lower extremities. Good ROM, no contractures. Normal muscle tone.  Skin: no rashes, lesions, ulcers. No  induration Neurologic: Patient is having mild slurred speech but does not look confused or agitated and able to answer all the questions appropriately.  CN 2-12 grossly intact. Sensation intact, DTR normal. Strength 5/5 in all 4.  Psychiatric: Normal judgment and insight. Alert and oriented x 3. Normal mood.   (Anything < 9 systems with 2 bullets each down codes to level 1) (If patient refuses exam can't bill higher level) (Make sure to document decubitus ulcers present on admission -- if possible -- and whether patient has chronic indwelling catheter at time of admission)  Labs on Admission: I have personally reviewed following labs and imaging studies  CBC: Recent Labs  Lab 07/02/19 2021  WBC 26.5*  NEUTROABS 22.1*  HGB 14.7  HCT 44.0  MCV 91.1  PLT 161   Basic Metabolic Panel: Recent Labs  Lab 07/02/19 2021  NA 129*  K 3.3*  CL 96*  CO2 24  GLUCOSE 118*  BUN 11  CREATININE 0.85  CALCIUM 8.0*   GFR: Estimated Creatinine Clearance: 83.8 mL/min (by C-G formula based on SCr of 0.85 mg/dL). Liver Function Tests: Recent Labs  Lab 07/02/19 2021  AST 16  ALT 10  ALKPHOS 136*  BILITOT 1.0  PROT 6.8  ALBUMIN 3.0*   No results for input(s): LIPASE, AMYLASE in the last 168 hours. No results for input(s): AMMONIA in the last 168 hours. Coagulation Profile: Recent Labs  Lab 07/02/19 2021  INR 1.1   Cardiac Enzymes: No results for input(s): CKTOTAL, CKMB, CKMBINDEX, TROPONINI in the last 168 hours. BNP (last 3 results) No results for input(s): PROBNP in the last 8760 hours. HbA1C: No results for input(s): HGBA1C in the last 72 hours. CBG: No results for input(s): GLUCAP in the last 168 hours. Lipid Profile: No results for input(s): CHOL, HDL, LDLCALC, TRIG, CHOLHDL, LDLDIRECT in the last 72 hours. Thyroid Function Tests: No results for input(s): TSH, T4TOTAL, FREET4, T3FREE, THYROIDAB in the last 72 hours. Anemia Panel: No results for input(s): VITAMINB12,  FOLATE, FERRITIN, TIBC, IRON, RETICCTPCT in the last 72 hours. Urine analysis:    Component Value Date/Time   COLORURINE AMBER (A) 07/02/2019 2315   APPEARANCEUR CLOUDY (A) 07/02/2019 2315   LABSPEC 1.024 07/02/2019 2315   PHURINE 5.0 07/02/2019 2315   GLUCOSEU NEGATIVE 07/02/2019 2315   HGBUR NEGATIVE 07/02/2019 2315   BILIRUBINUR SMALL (A) 07/02/2019 2315   KETONESUR NEGATIVE 07/02/2019 2315   PROTEINUR 100 (A) 07/02/2019 2315   UROBILINOGEN 1.0 06/03/2013 1006   NITRITE NEGATIVE 07/02/2019 2315   LEUKOCYTESUR MODERATE (A) 07/02/2019 2315    Radiological  Exams on Admission: DG Chest Port 1 View  Result Date: 07/02/2019 CLINICAL DATA:  Weakness. EXAM: PORTABLE CHEST 1 VIEW COMPARISON:  April 23, 2019 FINDINGS: Mild chronic appearing increased lung markings are seen without evidence of acute infiltrate, pleural effusion or pneumothorax. The heart size and mediastinal contours are within normal limits. Multilevel degenerative changes seen within the mid and lower thoracic spine. IMPRESSION: No active disease. Electronically Signed   By: Virgina Norfolk M.D.   On: 07/02/2019 22:34   DG FEMUR MIN 2 VIEWS LEFT  Result Date: 07/02/2019 CLINICAL DATA:  [place] yesterday EXAM: LEFT FEMUR 2 VIEWS COMPARISON:  05/31/2019 FINDINGS: Frontal and lateral views of the left femur are obtained. Left hip arthroplasty is identified in stable position with no evidence of acute complication. Heterotopic ossification surrounding the proximal left femur is unchanged. No fracture, subluxation, or dislocation. Soft tissues are unremarkable. IMPRESSION: 1. Stable heterotopic ossification surrounding the left hip arthroplasty. 2. No acute bony abnormalities. Electronically Signed   By: Randa Ngo M.D.   On: 07/02/2019 20:19      Assessment/Plan Principal Problem:   Sepsis secondary to UTI [facility]) Patient is given IV fluids according to the sepsis protocol and started on IV ceftriaxone in the ED. Continue  IV fluids with normal saline at the rate of 100 mL/h. Continue IV ceftriaxone. Blood and urine cultures ordered. Tylenol as needed for fever pain. Continuous telemetry monitoring.  Active Problems: Alcohol withdrawal Patient is a chronic alcoholic and alcohol level on arrival to the hospital was 102.  Patient is having slurred speech but no confusion or agitation at this time but it is highly likely that he will go into alcohol withdrawal. CIWA protocol with Ativan started. Continue folic acid and thiamine supplementation. Alcohol cessation counseling done but patient will most probably need to be counseled again at the time of discharge.   Left leg pain secondary to fall X-ray left lower extremity was negative for any acute fracture or dislocation. Continue pain medications as ordered.    TOBACCO ABUSE Patient admits of smoking 1 pack of cigarettes daily. Nicotine patch offered but patient denied.    LOW BACK PAIN, CHRONIC Patient has history of chronic back pain continue medication.    HTN (hypertension) Pressure is stable at this time Continue home amlodipine.  COPD Stable. Continue home inhalers.     (please populate well all problems here in Problem List. (For example, if patient is on BP meds at home and you resume or decide to hold them, it is a problem that needs to be her. Same for CAD, COPD, HLD and so on)    DVT prophylaxis: Lovenox Code Status: Full code Family Communication: No family member present at the bedside  Disposition Plan: Patient will be discharged to home Consults called: None Admission status: Inpatient/telemetry   Edmonia Lynch MD [facility] Pager [phone]   If 7PM-7AM, please contact night-coverage [email or web address] Password   07/03/2019, 3:37 AM

## 2019-07-04 DIAGNOSIS — A4150 Gram-negative sepsis, unspecified: Secondary | ICD-10-CM | POA: Diagnosis not present

## 2019-07-04 DIAGNOSIS — A419 Sepsis, unspecified organism: Secondary | ICD-10-CM | POA: Diagnosis not present

## 2019-07-04 DIAGNOSIS — N390 Urinary tract infection, site not specified: Secondary | ICD-10-CM | POA: Diagnosis not present

## 2019-07-04 LAB — BASIC METABOLIC PANEL
Anion gap: 7 (ref 5–15)
BUN: 10 mg/dL (ref 8–23)
CO2: 21 mmol/L — ABNORMAL LOW (ref 22–32)
Calcium: 7.6 mg/dL — ABNORMAL LOW (ref 8.9–10.3)
Chloride: 102 mmol/L (ref 98–111)
Creatinine, Ser: 0.55 mg/dL — ABNORMAL LOW (ref 0.61–1.24)
GFR calc Af Amer: >60 mL/min (ref >60)
GFR calc non Af Amer: >60 mL/min (ref >60)
Glucose, Bld: 121 mg/dL — ABNORMAL HIGH (ref 70–99)
Potassium: 4.2 mmol/L (ref 3.5–5.1)
Sodium: 130 mmol/L — ABNORMAL LOW (ref 135–145)

## 2019-07-04 LAB — CBC
HCT: 35.5 % — ABNORMAL LOW (ref 39.0–52.0)
Hemoglobin: 11.5 g/dL — ABNORMAL LOW (ref 13.0–17.0)
MCH: 29.7 pg (ref 26.0–34.0)
MCHC: 32.4 g/dL (ref 30.0–36.0)
MCV: 91.7 fL (ref 80.0–100.0)
Platelets: 136 10*3/uL — ABNORMAL LOW (ref 150–400)
RBC: 3.87 MIL/uL — ABNORMAL LOW (ref 4.22–5.81)
RDW: 15.4 % (ref 11.5–15.5)
WBC: 11.1 10*3/uL — ABNORMAL HIGH (ref 4.0–10.5)
nRBC: 0 % (ref 0.0–0.2)

## 2019-07-04 LAB — MAGNESIUM: Magnesium: 1.8 mg/dL (ref 1.7–2.4)

## 2019-07-04 MED ORDER — FOSFOMYCIN TROMETHAMINE 3 G PO PACK
3.0000 g | PACK | Freq: Once | ORAL | Status: AC
  Administered 2019-07-04: 3 g via ORAL
  Filled 2019-07-04: qty 3

## 2019-07-04 NOTE — Evaluation (Signed)
Physical Therapy Evaluation Patient Details Name: Duane Richardson MRN: [id number] DOB: 12/03/1955 Today's Date: 07/04/2019   History of Present Illness  Duane Richardson is a 64 y.o. male with medical history significant of tension, hyperlipidemia, COPD, GERD, chronic alcoholism and tobacco dependence presented to ED for evaluation of severe generalized weakness and urinary frequency.  Patient states that he is feeling very weak for the last 2 days and having increased urinary frequency with foul-smelling urine.  Patient further mentioned that he had several beers with his meal at cookout and while going back he fell off his scooter and landed on his left leg that resulted in pain in left hip and left leg.  Patient is having a little bit slurred speech but able to answer all the questions appropriately and denied fever, chills, headache, dizziness, chest pain, shortness of breath, abdominal pain and anxiety.    Clinical Impression  Patient functioning near baseline for functional mobility and gait, incontinent of stool when standing, able to transfer to [facility] with labored movement, tolerated standing with RW while being cleaned, ambulated in hallway without loss of balance, limited secondary to c/o fatigue and tolerated sitting up in chair after therapy - RN/NT aware.  Plan:  Patient to be discharged to home today and discharged from physical therapy to care of nursing for ambulation as tolerated for length of stay.     Follow Up Recommendations Home health PT;Supervision for mobility/OOB;Supervision - Intermittent    Equipment Recommendations  None recommended by PT    Recommendations for Other Services       Precautions / Restrictions Precautions Precautions: Fall Restrictions Weight Bearing Restrictions: No      Mobility  Bed Mobility Overal bed mobility: Needs Assistance Bed Mobility: Supine to Sit;Sit to Supine     Supine to sit: Supervision;Modified independent (Device/Increase  time) Sit to supine: Supervision   General bed mobility comments: increased time, labored movement  Transfers Overall transfer level: Needs assistance   Transfers: Sit to/from Stand;Stand Pivot Transfers Sit to Stand: Supervision;Min guard Stand pivot transfers: Supervision;Min guard       General transfer comment: required multiple attempt to complete sit to stand from bedside due to weakness  Ambulation/Gait Ambulation/Gait assistance: Min guard Gait Distance (Feet): 65 Feet Assistive device: Rolling walker (2 wheeled) Gait Pattern/deviations: Decreased step length - right;Decreased step length - left;Decreased stride length Gait velocity: decreased   General Gait Details: slow labored cadence, once fatigued had to slow cadence even more to maintain standing balance, required 1 standing rest break before making it back to room  Stairs            Wheelchair Mobility    Modified Rankin (Stroke Patients Only)       Balance Overall balance assessment: Needs assistance Sitting-balance support: Feet supported;No upper extremity supported Sitting balance-Leahy Scale: Good Sitting balance - Comments: seated at EOB   Standing balance support: During functional activity;Bilateral upper extremity supported Standing balance-Leahy Scale: Fair Standing balance comment: using RW                             Pertinent Vitals/Pain Pain Assessment: No/denies pain    Home Living Family/patient expects to be discharged to:: Private residence Living Arrangements: Other relatives(brother) Available Help at Discharge: Family;Personal care attendant Type of Home: House Home Access: Ramped entrance     Home Layout: One level Home Equipment: Walker - 2 wheels;Electric scooter;Shower seat;Cane - single point;Bedside commode;Hospital  bed      Prior Function Level of Independence: Independent with assistive device(s);Needs assistance   Gait / Transfers Assistance  Needed: household ambulator using RW, uses electric scooter for longer distances  ADL's / Homemaking Assistance Needed: home aides 3 hours/day x 6 days/week, his brother also helps        Hand Dominance   Dominant Hand: Right    Extremity/Trunk Assessment   Upper Extremity Assessment Upper Extremity Assessment: Generalized weakness    Lower Extremity Assessment Lower Extremity Assessment: Generalized weakness    Cervical / Trunk Assessment Cervical / Trunk Assessment: Normal  Communication   Communication: No difficulties  Cognition Arousal/Alertness: Awake/alert Behavior During Therapy: WFL for tasks assessed/performed;Impulsive Overall Cognitive Status: Within Functional Limits for tasks assessed                                        General Comments      Exercises     Assessment/Plan    PT Assessment All further PT needs can be met in the next venue of care  PT Problem List Decreased strength;Decreased activity tolerance;Decreased balance;Decreased mobility       PT Treatment Interventions      PT Goals (Current goals can be found in the Care Plan section)  Acute Rehab PT Goals Patient Stated Goal: return home with family and home aides to assist PT Goal Formulation: With patient Time For Goal Achievement: 07/04/19 Potential to Achieve Goals: Good    Frequency     Barriers to discharge        Co-evaluation               AM-PAC PT "6 Clicks" Mobility  Outcome Measure Help needed turning from your back to your side while in a flat bed without using bedrails?: None Help needed moving from lying on your back to sitting on the side of a flat bed without using bedrails?: None Help needed moving to and from a bed to a chair (including a wheelchair)?: A Little Help needed standing up from a chair using your arms (e.g., wheelchair or bedside chair)?: A Little Help needed to walk in hospital room?: A Little Help needed climbing 3-5  steps with a railing? : A Lot 6 Click Score: 19    End of Session   Activity Tolerance: Patient tolerated treatment well;Patient limited by fatigue Patient left: in chair;with call bell/phone within reach Nurse Communication: Mobility status PT Visit Diagnosis: Unsteadiness on feet (R26.81);Other abnormalities of gait and mobility (R26.89);Muscle weakness (generalized) (M62.81)    Time: 7841-2820 PT Time Calculation (min) (ACUTE ONLY): 34 min   Charges:   PT Evaluation $PT Eval Moderate Complexity: 1 Mod PT Treatments $Therapeutic Activity: 23-37 mins        2:51 PM, 07/04/19 Lonell Grandchild, MPT Physical Therapist with [facility] [phone] office [phone] mobile phone
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
## 2019-07-04 NOTE — TOC Transition Note (Signed)
Transition of Care [facility]) - CM/SW Discharge Note   Patient Details  Name: Duane Richardson MRN: [id number] Date of Birth: 1955-06-23  Transition of Care [facility]) CM/SW Contact:  Shade Flood, LCSW Phone Number: 07/04/2019, 11:34 AM   Clinical Narrative:     Pt admitted from home. Plan is for dc today. MD ordering [facility] PT for dc. Met with pt at bedside to assess. Per pt, he lives with his brother. He has a Hoveround and a walker for ambulation. Discussed Home Health with pt who states that he has [facility] already. He is unable to state which agency provides service but he states that Fernanda Drum is the person who comes out. This may be more of a PCS agency rather than HH. Pt does not want any additional referrals at this time. Also offered SNF rehab but pt is adamantly against this option. Pt states that he has someone to pick him up and he does not feel that he has any new TOC needs for dc.  Expected Discharge Plan: Home/Self Care Barriers to Discharge: Barriers Resolved   Patient Goals and CMS Choice        Expected Discharge Plan and Services Expected Discharge Plan: Home/Self Care In-house Referral: Clinical Social Work   Post Acute Care Choice: Resumption of Svcs/PTA Provider Living arrangements for the past 2 months: Single Family Home Expected Discharge Date: 07/04/19                                    Prior Living Arrangements/Services Living arrangements for the past 2 months: Single Family Home Lives with:: Siblings Patient language and need for interpreter reviewed:: Yes Do you feel safe going back to the place where you live?: Yes      Need for Family Participation in Patient Care: Yes (Comment) Care giver support system in place?: Yes (comment) Current home services: DME Criminal Activity/Legal Involvement Pertinent to Current Situation/Hospitalization: No - Comment as needed  Activities of Daily Living Home Assistive Devices/Equipment: Walker (specify  type), Bedside commode/3-in-1 ADL Screening (condition at time of admission) Patient's cognitive ability adequate to safely complete daily activities?: Yes Is the patient deaf or have difficulty hearing?: No Does the patient have difficulty seeing, even when wearing glasses/contacts?: No Does the patient have difficulty concentrating, remembering, or making decisions?: No Patient able to express need for assistance with ADLs?: Yes Does the patient have difficulty dressing or bathing?: Yes Independently performs ADLs?: No Communication: Independent Dressing (OT): Needs assistance Is this a change from baseline?: Pre-admission baseline Grooming: Needs assistance Is this a change from baseline?: Pre-admission baseline Feeding: Independent Bathing: Needs assistance Is this a change from baseline?: Pre-admission baseline Toileting: Needs assistance Is this a change from baseline?: Pre-admission baseline In/Out Bed: Needs assistance Is this a change from baseline?: Pre-admission baseline Walks in Home: Needs assistance Is this a change from baseline?: Pre-admission baseline Does the patient have difficulty walking or climbing stairs?: Yes Weakness of Legs: Both Weakness of Arms/Hands: None  Permission Sought/Granted                  Emotional Assessment Appearance:: Appears younger than stated age Attitude/Demeanor/Rapport: Engaged Affect (typically observed): Pleasant Orientation: : Oriented to Self, Oriented to Place, Oriented to  Time, Oriented to Situation Alcohol / Substance Use: Alcohol Use Psych Involvement: No (comment)  Admission diagnosis:  Fall [W19.XXXA] Acute cystitis without hematuria [N30.00] Sepsis secondary to UTI (  [place]) [A41.9, N39.0] Leukocytosis, unspecified type [D72.829] Sepsis without acute organ dysfunction, due to unspecified organism [facility]) [A41.9] Patient Active Problem List   Diagnosis Date Noted  . Sepsis secondary to UTI ([place]) 07/03/2019  .  Other fracture of right femur, initial encounter for closed fracture ([place]) 04/13/2019  . Atherosclerosis   . Acute colitis 12/05/2015  . Syncope 12/05/2015  . Hypokalemia 12/05/2015  . Hypomagnesemia 12/05/2015  . Thrombocytopenia ([place]) 12/05/2015  . Alcohol withdrawal ([place]) 12/04/2015  . Generalized weakness 12/04/2015  . Abdominal pain 12/04/2015  . Nausea & vomiting 12/04/2015  . Arthritis of left hip 06/07/2013  . Status post THR (total hip replacement) 06/07/2013  . BPH (benign prostatic hypertrophy) with urinary obstruction 03/22/2013  . Protein-calorie malnutrition, severe ([place]) 11/30/2012  . Hip fracture ([place]) 11/23/2012  . HTN (hypertension) 11/23/2012  . Steatosis of liver 11/15/2012  . Anorexia nervosa 10/13/2012  . Loss of weight 07/16/2012  . ETOH abuse   . Weight loss 02/17/2012  . Dysphagia 12/30/2011  . Oral candida 12/30/2011  . Rectal bleed 12/30/2011  . Unintentional weight loss 12/30/2011  . Hx of adenomatous colonic polyps 12/30/2011  . Leucopenia 07/08/2007  . COPD (chronic obstructive pulmonary disease) ([place]) 12/21/2006  . LIVER FUNCTION TESTS, ABNORMAL 11/09/2006  . HYPERLIPIDEMIA 05/27/2006  . DISORDER, BIPOLAR NOS 05/27/2006  . TOBACCO ABUSE 05/27/2006  . Depression 05/27/2006  . CATARACT NOS 05/27/2006  . ALLERGIC RHINITIS 05/27/2006  . ASTHMA 05/27/2006  . GERD 05/27/2006  . ARTHRITIS 05/27/2006  . LOW BACK PAIN, CHRONIC 05/27/2006  . MALAISE AND FATIGUE 05/27/2006   PCP:  [name], MD Pharmacy:   [facility], [address] [place] [address] Phone: [phone] Fax: [phone]     Social Determinants of Health (SDOH) Interventions    Readmission Risk Interventions Readmission Risk Prevention Plan 07/04/2019 04/20/2019  Transportation Screening Complete Complete  PCP or Specialist Appt within 5-7 Days - Not Complete  Not Complete comments - plan for SNF  Home Care Screening -  Complete  Medication Review (RN CM) Complete Referral to Pharmacy  Some recent data might be hidden      Final next level of care: Home/Self Care Barriers to Discharge: Barriers Resolved   Patient Goals and CMS Choice        Discharge Placement                       Discharge Plan and Services In-house Referral: Clinical Social Work   Post Acute Care Choice: Resumption of Svcs/PTA Provider                               Social Determinants of Health (SDOH) Interventions     Readmission Risk Interventions Readmission Risk Prevention Plan 07/04/2019 04/20/2019  Transportation Screening Complete Complete  PCP or Specialist Appt within 5-7 Days - Not Complete  Not Complete comments - plan for SNF  Home Care Screening - Complete  Medication Review (RN CM) Complete Referral to Pharmacy  Some recent data might be hidden

## 2019-07-05 LAB — URINE CULTURE: Culture: >=100000 — AB

## 2019-07-08 LAB — CULTURE, BLOOD (ROUTINE X 2)
Culture: NO GROWTH
Culture: NO GROWTH
Special Requests: ADEQUATE
Special Requests: ADEQUATE

## 2019-07-12 ENCOUNTER — Ambulatory Visit (INDEPENDENT_AMBULATORY_CARE_PROVIDER_SITE_OTHER): Payer: Medicare Other

## 2019-07-12 ENCOUNTER — Ambulatory Visit (INDEPENDENT_AMBULATORY_CARE_PROVIDER_SITE_OTHER): Payer: Medicare Other | Admitting: Orthopaedic Surgery

## 2019-07-12 ENCOUNTER — Other Ambulatory Visit: Payer: Self-pay

## 2019-07-12 DIAGNOSIS — S72144D Nondisplaced intertrochanteric fracture of right femur, subsequent encounter for closed fracture with routine healing: Secondary | ICD-10-CM

## 2019-07-12 NOTE — Progress Notes (Signed)
Post-Op Visit Note   Patient: Duane Richardson           Date of Birth: 04-Jun-1955           MRN: [id number] Visit Date: 07/12/2019 PCP: [name], MD   Assessment & Plan:  Chief Complaint:  Chief Complaint  Patient presents with   Right Hip - Pain   Visit Diagnoses:  1. Closed nondisplaced intertrochanteric fracture of right femur with routine healing, subsequent encounter     Plan: Chaka is 12 weeks 5 days status post IM nail right intertrochanteric fracture.  He is ambulating with a walker.  He endorses some thigh pain with activity.  He is doing much better overall.  Is worse with cold weather.  Surgical scars are fully healed.  He is ambulating much better at this time.  His x-rays demonstrate a nearly fully healed fracture.  At this point he can continue to increase activity as tolerated.  He needs to keep doing his home exercises.  We will see him back as needed.  Follow-Up Instructions: Return if symptoms worsen or fail to improve.   Orders:  Orders Placed This Encounter  Procedures   XR FEMUR, MIN 2 VIEWS RIGHT   No orders of the defined types were placed in this encounter.   Imaging: XR FEMUR, MIN 2 VIEWS RIGHT  Result Date: 07/12/2019 Almost complete fracture healing of intertrochanteric fracture.  No hardware complications.   PMFS History: Patient Active Problem List   Diagnosis Date Noted   Sepsis secondary to UTI ([place]) 07/03/2019   Other fracture of right femur, initial encounter for closed fracture ([place]) 04/13/2019   Atherosclerosis    Acute colitis 12/05/2015   Syncope 12/05/2015   Hypokalemia 12/05/2015   Hypomagnesemia 12/05/2015   Thrombocytopenia ([facility]) 12/05/2015   Alcohol withdrawal ([place]) 12/04/2015   Generalized weakness 12/04/2015   Abdominal pain 12/04/2015   Nausea & vomiting 12/04/2015   Arthritis of left hip 06/07/2013   Status post THR (total hip replacement) 06/07/2013   BPH (benign prostatic hypertrophy) with  urinary obstruction 03/22/2013   Protein-calorie malnutrition, severe ([place]) 11/30/2012   Hip fracture ([place]) 11/23/2012   HTN (hypertension) 11/23/2012   Steatosis of liver 11/15/2012   Anorexia nervosa 10/13/2012   Loss of weight 07/16/2012   ETOH abuse    Weight loss 02/17/2012   Dysphagia 12/30/2011   Oral candida 12/30/2011   Rectal bleed 12/30/2011   Unintentional weight loss 12/30/2011   Hx of adenomatous colonic polyps 12/30/2011   Leucopenia 07/08/2007   COPD (chronic obstructive pulmonary disease) ([place]) 12/21/2006   LIVER FUNCTION TESTS, ABNORMAL 11/09/2006   HYPERLIPIDEMIA 05/27/2006   DISORDER, BIPOLAR NOS 05/27/2006   TOBACCO ABUSE 05/27/2006   Depression 05/27/2006   CATARACT NOS 05/27/2006   ALLERGIC RHINITIS 05/27/2006   ASTHMA 05/27/2006   GERD 05/27/2006   ARTHRITIS 05/27/2006   LOW BACK PAIN, CHRONIC 05/27/2006   MALAISE AND FATIGUE 05/27/2006   Past Medical History:  Diagnosis Date   Allergy    Anxiety    Arthritis    Asthma    Atherosclerosis    Bipolar 1 disorder ([place])    Cataract    COPD (chronic obstructive pulmonary disease) ([place])    Depression    Elevated PSA    ETOH abuse    quit Oct 2013, then relapsed, as of Dec 2013 now has abstained X 1 month   GERD (gastroesophageal reflux disease)    Hyperlipidemia    Hypertension  Right hip pain 04/13/2019    Family History  Problem Relation Age of Onset   Mental illness Sister    Colon cancer Neg Hx     Past Surgical History:  Procedure Laterality Date   BACK SURGERY     lunbar   COLONOSCOPY  12/21/09   YBF:XOVA papilla otherwise normal/ pancolonic diverticula/mutiple colonic poylps   COLONOSCOPY WITH ESOPHAGOGASTRODUODENOSCOPY (EGD) N/A 10/20/2012   Procedure: COLONOSCOPY WITH ESOPHAGOGASTRODUODENOSCOPY (EGD);  Surgeon: Daneil Dolin, MD;  [facility];  Service: Endoscopy;  Laterality: N/A;  10;15   CYSTOSCOPY N/A 01/31/2013    Procedure: CYSTOSCOPY FLEXIBLE;  Surgeon: Marissa Nestle, MD;  Location: [facility];  Service: Urology;  Laterality: N/A;  I would like to do this around 1 pm on monday.    HARDWARE REMOVAL Left 06/07/2013   Procedure: HARDWARE REMOVAL;  Surgeon: Mcarthur Rossetti, MD;  Location: [facility];  Service: Orthopedics;  Laterality: Left;   INTRAMEDULLARY (IM) NAIL INTERTROCHANTERIC Right 04/14/2019   Procedure: INTRAMEDULLARY (IM) NAIL INTERTROCHANTRIC;  Surgeon: Leandrew Koyanagi, MD;  Location: [facility];  Service: Orthopedics;  Laterality: Right;   ORIF HIP FRACTURE Left 11/24/2012   Procedure: OPEN REDUCTION INTERNAL FIXATION HIP;  Surgeon: Sanjuana Kava, MD;  Location: [facility];  Service: Orthopedics;  Laterality: Left;   SPINE SURGERY     TOTAL HIP ARTHROPLASTY Left 06/07/2013   Procedure: REMOVE HARDWARE LEFT HIP AND LEFT TOTAL HIP ARTHROPLASTY ANTERIOR APPROACH;  Surgeon: Mcarthur Rossetti, MD;  Location: [facility];  Service: Orthopedics;  Laterality: Left;   TRANSURETHRAL RESECTION OF PROSTATE N/A 03/22/2013   Procedure: TRANSURETHRAL RESECTION OF THE PROSTATE (TURP);  Surgeon: Marissa Nestle, MD;  Location: [facility];  Service: Urology;  Laterality: N/A;   Social History   Occupational History   Occupation: disabled  Tobacco Use   Smoking status: Current Every Day Smoker    Packs/day: 0.50    Years: 30.00    Pack years: 15.00    Types: Cigarettes   Smokeless tobacco: Never Used  Substance and Sexual Activity   Alcohol use: No    Alcohol/week: 30.0 standard drinks    Types: 30 Glasses of wine per week    Comment: history of  about a liter of wine per day-before that   Drug use: Not Currently    Types: Marijuana, Cocaine    Comment: Per patient "years ago" 6/20   Sexual activity: Yes    Birth control/protection: None

## 2019-08-01 ENCOUNTER — Telehealth (HOSPITAL_COMMUNITY): Payer: Self-pay | Admitting: Licensed Clinical Social Worker

## 2019-11-03 ENCOUNTER — Ambulatory Visit: Payer: Medicare Other | Attending: Internal Medicine

## 2019-11-03 DIAGNOSIS — Z23 Encounter for immunization: Secondary | ICD-10-CM

## 2019-11-03 NOTE — Progress Notes (Signed)
   Covid-19 Vaccination Clinic  Name:  Riad Wagley Mahone    MRN: [id number] DOB: 03-07-1955  11/03/2019  Mr. Portugal was observed post Covid-19 immunization for 15 minutes without incident. He was provided with Vaccine Information Sheet and instruction to access the V-Safe system.   Mr. Sachs was instructed to call 911 with any severe reactions post vaccine: Marland Kitchen Difficulty breathing  . Swelling of face and throat  . A fast heartbeat  . A bad rash all over body  . Dizziness and weakness

## 2019-11-24 ENCOUNTER — Ambulatory Visit: Payer: Medicare Other

## 2019-12-29 ENCOUNTER — Other Ambulatory Visit: Payer: Self-pay

## 2019-12-29 ENCOUNTER — Ambulatory Visit (INDEPENDENT_AMBULATORY_CARE_PROVIDER_SITE_OTHER): Payer: Medicare Other | Admitting: Podiatry

## 2019-12-29 ENCOUNTER — Encounter: Payer: Self-pay | Admitting: Podiatry

## 2019-12-29 DIAGNOSIS — M79675 Pain in left toe(s): Secondary | ICD-10-CM

## 2019-12-29 DIAGNOSIS — B353 Tinea pedis: Secondary | ICD-10-CM | POA: Diagnosis not present

## 2019-12-29 DIAGNOSIS — M79674 Pain in right toe(s): Secondary | ICD-10-CM

## 2019-12-29 DIAGNOSIS — B351 Tinea unguium: Secondary | ICD-10-CM

## 2019-12-29 DIAGNOSIS — L853 Xerosis cutis: Secondary | ICD-10-CM | POA: Diagnosis not present

## 2019-12-29 MED ORDER — KETOCONAZOLE 2 % EX CREA
1.0000 | TOPICAL_CREAM | Freq: Two times a day (BID) | CUTANEOUS | 2 refills | Status: DC
Start: 2019-12-29 — End: 2021-10-31

## 2019-12-29 NOTE — Patient Instructions (Signed)
Moisturize feet once daily; do not apply between toes: A.  Aquaphor Healing Ointment B.  Vaseline Intensive Care Lotion C.  Lubriderm Lotion D.  Gold Bond Diabetic Foot Lotion E.  Eucerin Intensive Repair Moisturizing Lotion  If you have problems reaching your feet:  A.  Aquaphor Advanced Therapy Ointment Body Spray B.  Vaseline Intensive Care Spray Lotion Advanced Repair     

## 2019-12-29 NOTE — Progress Notes (Signed)
  Subjective:  Patient ID: Duane Richardson, male    DOB: 1955-06-01,  MRN: [id number]  Chief Complaint  Patient presents with  . Foot Pain    Bilateral feet skin is dry and flakey. Feet are tender to the touch and nails are thickened and discolored     64 y.o. male presents with the above complaint. History confirmed with patient.  He was in the hospital recently try to have this addressed but there was no one to do it there.  Nails are painful with pressure, very thick and he is unable to cut them himself.  Uses a walker to ambulate.  Objective:  Physical Exam: Bilaterally the foot is warm and well perfused with normal capillary fill time.  He has weakly palpable pulses bilaterally.  There is severe onychomycosis of all 10 digits.  There is scaling dry skin consistent with tinea pedis and xerosis cutis bilaterally  Assessment:   1. Onychomycosis   2. Pain due to onychomycosis of toenails of both feet   3. Tinea pedis of both feet   4. Xerosis cutis      Plan:  Patient was evaluated and treated and all questions answered.  Discussed the etiology and treatment options for tinea pedis.  Discussed topical and oral treatment.  Recommended topical treatment with 2% ketoconazole cream.  This was sent to the patient's pharmacy.  Also discussed appropriate foot hygiene, use of antifungal spray such as Tinactin in shoes, as well as cleaning his foot facing surfaces such as showers and bathroom floors with bleach.   Discussed the etiology and treatment options for the condition in detail with the patient. Educated patient on the topical and oral treatment options for mycotic nails. Recommended debridement of the nails today. Sharp and mechanical debridement performed of all painful and mycotic nails today. Nails debrided in length and thickness using a nail nipper and a mechanical burr to level of comfort. Discussed treatment options including appropriate shoe gear. Follow up as needed for painful  nails.    Return in about 3 months (around 03/30/2020).

## 2020-02-14 ENCOUNTER — Encounter (HOSPITAL_COMMUNITY): Payer: Self-pay | Admitting: Physical Therapy

## 2020-02-14 NOTE — Therapy (Signed)
[place], [place], [address] Phone: [phone]   Fax:  [phone]  Patient Details  Name: Duane Richardson MRN: [id number] Date of Birth: [birth year] Referring Provider:  No ref. provider found  Encounter Date: 02/14/2020  PHYSICAL THERAPY DISCHARGE SUMMARY  Visits from Start of Care: 4  Current functional level related to goals / functional outcomes: NA   Remaining deficits: NA   Education / Equipment:  Patient did not return for follow up visits. DC from therapy.  Plan:                                                    Patient goals were not met. Patient is being discharged due to not returning since the last visit.  ?????        3:57 PM, 02/14/20 Josue Hector PT DPT  Physical Therapist with [facility]  [phone] [place] [address] [place], [place], [address] Phone: [phone]   Fax:  [phone]

## 2020-04-04 ENCOUNTER — Encounter: Payer: Self-pay | Admitting: Podiatry

## 2020-04-04 ENCOUNTER — Other Ambulatory Visit: Payer: Self-pay

## 2020-04-04 ENCOUNTER — Ambulatory Visit (INDEPENDENT_AMBULATORY_CARE_PROVIDER_SITE_OTHER): Payer: Medicare Other | Admitting: Podiatry

## 2020-04-04 DIAGNOSIS — L853 Xerosis cutis: Secondary | ICD-10-CM

## 2020-04-04 DIAGNOSIS — M79675 Pain in left toe(s): Secondary | ICD-10-CM

## 2020-04-04 DIAGNOSIS — B351 Tinea unguium: Secondary | ICD-10-CM | POA: Diagnosis not present

## 2020-04-04 DIAGNOSIS — M79674 Pain in right toe(s): Secondary | ICD-10-CM

## 2020-04-04 NOTE — Progress Notes (Signed)
This patient returns to my office for at risk foot care.  This patient requires this care by a professional since this patient will be at risk due to having thrombocytopenia.  Patient presents with long thick nails which are painful walking and wearing his shoes.  This patient is unable to cut nails himself since the patient cannot reach his nails.  He also has dry crusty plantar feet.  He was previously seen by Dr.  Sherryle Lis who recommended five different foot moisturizer creams as well as ketoconazole cream.  .  He says he is unable to apply the moisturizer and the home health services has stopped coming to his house..  This patient presents for at risk foot care today.  General Appearance  Alert, conversant and in no acute stress.  Vascular  Dorsalis pedis and posterior tibial  pulses are weakly  palpable  bilaterally.  Capillary return is within normal limits  bilaterally. Cold feet bilaterally.  Absent digital hair  B/L.  Neurologic  Senn-Weinstein monofilament wire test diminished  bilaterally. Muscle power within normal limits bilaterally.  Nails Thick disfigured discolored nails with subungual debris  from hallux to fifth toes bilaterally. No evidence of bacterial infection or drainage bilaterally.  Orthopedic  No limitations of motion  feet .  No crepitus or effusions noted.  No bony pathology or digital deformities noted.  Skin  normotropic skin with no porokeratosis noted bilaterally.  No signs of infections or ulcers noted.     Onychomycosis  Pain in right toes  Pain in left toes  Consent was obtained for treatment procedures.   Mechanical debridement of nails 1-5  bilaterally performed with a nail nipper.  Filed with dremel without incident. Patient frequently flinched from just touching his feet.  Nail care was difficult.  I proceeded to apply moisturizer on his feet and allowed the moisturizer to sit on his feet for 10 minutes.  To call his home health service.   Return office visit     3 months                 Told patient to return for periodic foot care and evaluation due to potential at risk complications.   Duane Richardson DPM

## 2020-07-04 ENCOUNTER — Ambulatory Visit: Payer: Medicare Other | Admitting: Podiatry

## 2020-07-16 ENCOUNTER — Other Ambulatory Visit: Payer: Self-pay

## 2020-07-16 ENCOUNTER — Encounter: Payer: Self-pay | Admitting: Podiatry

## 2020-07-16 ENCOUNTER — Ambulatory Visit (INDEPENDENT_AMBULATORY_CARE_PROVIDER_SITE_OTHER): Payer: Medicare Other | Admitting: Podiatry

## 2020-07-16 DIAGNOSIS — L853 Xerosis cutis: Secondary | ICD-10-CM

## 2020-07-16 DIAGNOSIS — M79675 Pain in left toe(s): Secondary | ICD-10-CM | POA: Diagnosis not present

## 2020-07-16 DIAGNOSIS — M79674 Pain in right toe(s): Secondary | ICD-10-CM

## 2020-07-16 DIAGNOSIS — B351 Tinea unguium: Secondary | ICD-10-CM

## 2020-07-16 NOTE — Progress Notes (Signed)
This patient returns to my office for at risk foot care.  This patient requires this care by a professional since this patient will be at risk due to having thrombocytopenia.  Patient presents with long thick nails which are painful walking and wearing his shoes.  This patient is unable to cut nails himself since the patient cannot reach his nails.   Dry scaly skin on bottom of feet. Marland Kitchen  He says he is unable to apply the moisturizer and the home health services has stopped coming to his house..  This patient presents for at risk foot care today.  General Appearance  Alert, conversant and in no acute stress.  Vascular  Dorsalis pedis and posterior tibial  pulses are weakly  palpable  bilaterally.  Capillary return is within normal limits  bilaterally. Cold feet bilaterally.  Absent digital hair  B/L.  Neurologic  Senn-Weinstein monofilament wire test diminished  bilaterally. Muscle power within normal limits bilaterally.  Nails Thick disfigured discolored nails with subungual debris  from hallux to fifth toes bilaterally. No evidence of bacterial infection or drainage bilaterally.  Orthopedic  No limitations of motion  feet .  No crepitus or effusions noted.  No bony pathology or digital deformities noted.  Skin  normotropic skin with no porokeratosis noted bilaterally.  No signs of infections or ulcers noted.   Dry scaly plantar skin  B/L.  Onychomycosis  Pain in right toes  Pain in left toes  Consent was obtained for treatment procedures.   Mechanical debridement of nails 1-5  bilaterally performed with a nail nipper.  Filed with dremel without incident.   I proceeded to apply moisturizer on his feet and allowed the moisturizer to sit on his feet   To call his home health service.   Return office visit    3 months                 Told patient to return for periodic foot care and evaluation due to potential at risk complications.   Gardiner Barefoot DPM

## 2020-09-06 ENCOUNTER — Encounter: Payer: Self-pay | Admitting: Podiatry

## 2020-10-16 ENCOUNTER — Ambulatory Visit: Payer: Medicare Other | Admitting: Podiatry

## 2020-11-04 ENCOUNTER — Emergency Department (HOSPITAL_COMMUNITY): Payer: Medicare Other

## 2020-11-04 ENCOUNTER — Emergency Department (HOSPITAL_COMMUNITY)
Admission: EM | Admit: 2020-11-04 | Discharge: 2020-11-04 | Disposition: A | Discharge status: Home / Self Care | Payer: Medicare Other | Attending: Emergency Medicine | Admitting: Emergency Medicine

## 2020-11-04 ENCOUNTER — Other Ambulatory Visit: Payer: Self-pay

## 2020-11-04 ENCOUNTER — Encounter (HOSPITAL_COMMUNITY): Payer: Self-pay | Admitting: Emergency Medicine

## 2020-11-04 DIAGNOSIS — Z20822 Contact with and (suspected) exposure to covid-19: Secondary | ICD-10-CM | POA: Insufficient documentation

## 2020-11-04 DIAGNOSIS — J45909 Unspecified asthma, uncomplicated: Secondary | ICD-10-CM | POA: Diagnosis not present

## 2020-11-04 DIAGNOSIS — R55 Syncope and collapse: Secondary | ICD-10-CM | POA: Diagnosis not present

## 2020-11-04 DIAGNOSIS — J449 Chronic obstructive pulmonary disease, unspecified: Secondary | ICD-10-CM | POA: Diagnosis not present

## 2020-11-04 DIAGNOSIS — Z7982 Long term (current) use of aspirin: Secondary | ICD-10-CM | POA: Insufficient documentation

## 2020-11-04 DIAGNOSIS — J3489 Other specified disorders of nose and nasal sinuses: Secondary | ICD-10-CM | POA: Diagnosis not present

## 2020-11-04 DIAGNOSIS — W01198A Fall on same level from slipping, tripping and stumbling with subsequent striking against other object, initial encounter: Secondary | ICD-10-CM | POA: Diagnosis not present

## 2020-11-04 DIAGNOSIS — I10 Essential (primary) hypertension: Secondary | ICD-10-CM | POA: Diagnosis not present

## 2020-11-04 DIAGNOSIS — F17210 Nicotine dependence, cigarettes, uncomplicated: Secondary | ICD-10-CM | POA: Diagnosis not present

## 2020-11-04 DIAGNOSIS — Z79899 Other long term (current) drug therapy: Secondary | ICD-10-CM | POA: Diagnosis not present

## 2020-11-04 DIAGNOSIS — Z96642 Presence of left artificial hip joint: Secondary | ICD-10-CM | POA: Diagnosis not present

## 2020-11-04 DIAGNOSIS — R251 Tremor, unspecified: Secondary | ICD-10-CM | POA: Insufficient documentation

## 2020-11-04 DIAGNOSIS — Z7951 Long term (current) use of inhaled steroids: Secondary | ICD-10-CM | POA: Diagnosis not present

## 2020-11-04 DIAGNOSIS — M545 Low back pain, unspecified: Secondary | ICD-10-CM | POA: Diagnosis not present

## 2020-11-04 DIAGNOSIS — R059 Cough, unspecified: Secondary | ICD-10-CM

## 2020-11-04 DIAGNOSIS — W19XXXA Unspecified fall, initial encounter: Secondary | ICD-10-CM

## 2020-11-04 LAB — CBC WITH DIFFERENTIAL/PLATELET
Basophils Absolute: 0 10*3/uL (ref 0.0–0.1)
Basophils Relative: 0 %
Eosinophils Absolute: 0 10*3/uL (ref 0.0–0.5)
Eosinophils Relative: 0 %
HCT: 39.2 % (ref 39.0–52.0)
Hemoglobin: 13.3 g/dL (ref 13.0–17.0)
Lymphocytes Relative: 15 %
Lymphs Abs: 1.3 10*3/uL (ref 0.7–4.0)
MCH: 34.2 pg — ABNORMAL HIGH (ref 26.0–34.0)
MCHC: 33.9 g/dL (ref 30.0–36.0)
MCV: 100.8 fL — ABNORMAL HIGH (ref 80.0–100.0)
Monocytes Absolute: 0.8 10*3/uL (ref 0.1–1.0)
Monocytes Relative: 9 %
Neutro Abs: 6.7 10*3/uL (ref 1.7–7.7)
Neutrophils Relative %: 76 %
Platelets: 163 10*3/uL (ref 150–400)
RBC: 3.89 MIL/uL — ABNORMAL LOW (ref 4.22–5.81)
RDW: 15.9 % — ABNORMAL HIGH (ref 11.5–15.5)
WBC: 8.8 10*3/uL (ref 4.0–10.5)
nRBC: 0 % (ref 0.0–0.2)

## 2020-11-04 LAB — COMPREHENSIVE METABOLIC PANEL
ALT: 29 U/L (ref 0–44)
AST: 100 U/L — ABNORMAL HIGH (ref 15–41)
Albumin: 3.2 g/dL — ABNORMAL LOW (ref 3.5–5.0)
Alkaline Phosphatase: 114 U/L (ref 38–126)
Anion gap: 9 (ref 5–15)
BUN: <5 mg/dL — ABNORMAL LOW (ref 8–23)
CO2: 21 mmol/L — ABNORMAL LOW (ref 22–32)
Calcium: 7.8 mg/dL — ABNORMAL LOW (ref 8.9–10.3)
Chloride: 109 mmol/L (ref 98–111)
Creatinine, Ser: 0.67 mg/dL (ref 0.61–1.24)
GFR, Estimated: >60 mL/min (ref >60)
Glucose, Bld: 199 mg/dL — ABNORMAL HIGH (ref 70–99)
Potassium: 4.4 mmol/L (ref 3.5–5.1)
Sodium: 139 mmol/L (ref 135–145)
Total Bilirubin: 1.5 mg/dL — ABNORMAL HIGH (ref 0.3–1.2)
Total Protein: 6.6 g/dL (ref 6.5–8.1)

## 2020-11-04 LAB — RESP PANEL BY RT-PCR (FLU A&B, COVID) ARPGX2
Influenza A by PCR: NEGATIVE
Influenza B by PCR: NEGATIVE
SARS Coronavirus 2 by RT PCR: NEGATIVE

## 2020-11-04 LAB — ETHANOL: Alcohol, Ethyl (B): <10 mg/dL (ref <10)

## 2020-11-04 MED ORDER — THIAMINE HCL 100 MG/ML IJ SOLN: 100.0000 mg | Freq: Every day | INTRAMUSCULAR | Status: DC

## 2020-11-04 MED ORDER — LORAZEPAM 2 MG/ML IJ SOLN: 0.0000 mg | Freq: Two times a day (BID) | INTRAMUSCULAR | Status: DC

## 2020-11-04 MED ORDER — LORAZEPAM 1 MG PO TABS
0.0000 mg | ORAL_TABLET | Freq: Four times a day (QID) | ORAL | Status: DC
  Administered 2020-11-04: 1 mg via ORAL
  Filled 2020-11-04: qty 1

## 2020-11-04 MED ORDER — LORAZEPAM 1 MG PO TABS: 0.0000 mg | ORAL_TABLET | Freq: Two times a day (BID) | ORAL | Status: DC

## 2020-11-04 MED ORDER — SODIUM CHLORIDE 0.9 % IV BOLUS
1000.0000 mL | Freq: Once | INTRAVENOUS | Status: AC
  Administered 2020-11-04: 1000 mL via INTRAVENOUS

## 2020-11-04 MED ORDER — LORAZEPAM 2 MG/ML IJ SOLN: 0.0000 mg | Freq: Four times a day (QID) | INTRAMUSCULAR | Status: DC

## 2020-11-04 MED ORDER — THIAMINE HCL 100 MG PO TABS
100.0000 mg | ORAL_TABLET | Freq: Every day | ORAL | Status: DC
  Administered 2020-11-04: 100 mg via ORAL
  Filled 2020-11-04: qty 1

## 2020-11-04 MED ORDER — DICLOFENAC SODIUM 1 % EX GEL: 2.0000 g | Freq: Four times a day (QID) | CUTANEOUS | 0 refills | Status: AC

## 2020-11-04 NOTE — ED Notes (Signed)
Checked on pt. Cleaned and changed pt he had a  small BM. Put pt on a pure wick.

## 2020-11-04 NOTE — ED Provider Notes (Signed)
[facility] EMERGENCY DEPARTMENT Provider Note   CSN: [id number] Arrival date & time: 11/04/20  6712     History Chief Complaint  Patient presents with   Duane Richardson is a 65 y.o. male.  65 year old male with history of COPD, alcohol abuse, hyperlipidemia, hypertension presents with complaint of pain in his lower back after a fall yesterday.  Patient states that he was partying, had too much to drink and fell backwards onto the hard floor.  Reports hitting his head with loss of consciousness.  States he was then helped back to his feet and was ambulatory.  Primary complaint of right low back pain, has not had any alcohol to drink since yesterday.  States that he falls about twice a month.  Patient is not on blood thinners.  No other complaints or concerns at this time.  No history of seizures.      Past Medical History:  Diagnosis Date   Allergy    Anxiety    Arthritis    Asthma    Atherosclerosis    Bipolar 1 disorder (HCC)    Cataract    COPD (chronic obstructive pulmonary disease) ([place])    Depression    Elevated PSA    ETOH abuse    quit Oct 2013, then relapsed, as of Dec 2013 now has abstained X 1 month   GERD (gastroesophageal reflux disease)    Hyperlipidemia    Hypertension    Right hip pain 04/13/2019    Patient Active Problem List   Diagnosis Date Noted   Sepsis secondary to UTI ([place]) 07/03/2019   Other fracture of right femur, initial encounter for closed fracture ([place]) 04/13/2019   Atherosclerosis    Acute colitis 12/05/2015   Syncope 12/05/2015   Hypokalemia 12/05/2015   Hypomagnesemia 12/05/2015   Thrombocytopenia ([place]) 12/05/2015   Alcohol withdrawal ([place]) 12/04/2015   Generalized weakness 12/04/2015   Abdominal pain 12/04/2015   Nausea & vomiting 12/04/2015   Arthritis of left hip 06/07/2013   Status post THR (total hip replacement) 06/07/2013   BPH (benign prostatic hypertrophy) with urinary obstruction 03/22/2013   Protein-calorie  malnutrition, severe ([place]) 11/30/2012   Hip fracture ([place]) 11/23/2012   HTN (hypertension) 11/23/2012   Steatosis of liver 11/15/2012   Anorexia nervosa 10/13/2012   Loss of weight 07/16/2012   ETOH abuse    Weight loss 02/17/2012   Dysphagia 12/30/2011   Oral candida 12/30/2011   Rectal bleed 12/30/2011   Unintentional weight loss 12/30/2011   Hx of adenomatous colonic polyps 12/30/2011   Leucopenia 07/08/2007   COPD (chronic obstructive pulmonary disease) ([place]) 12/21/2006   LIVER FUNCTION TESTS, ABNORMAL 11/09/2006   HYPERLIPIDEMIA 05/27/2006   DISORDER, BIPOLAR NOS 05/27/2006   TOBACCO ABUSE 05/27/2006   Depression 05/27/2006   CATARACT NOS 05/27/2006   ALLERGIC RHINITIS 05/27/2006   ASTHMA 05/27/2006   GERD 05/27/2006   ARTHRITIS 05/27/2006   LOW BACK PAIN, CHRONIC 05/27/2006   MALAISE AND FATIGUE 05/27/2006    Past Surgical History:  Procedure Laterality Date   BACK SURGERY     lunbar   COLONOSCOPY  12/21/09   WPY:KDXI papilla otherwise normal/ pancolonic diverticula/mutiple colonic poylps   COLONOSCOPY WITH ESOPHAGOGASTRODUODENOSCOPY (EGD) N/A 10/20/2012   Procedure: COLONOSCOPY WITH ESOPHAGOGASTRODUODENOSCOPY (EGD);  Surgeon: Daneil Dolin, MD;  Location[facility];  Service: Endoscopy;  Laterality: N/A;  10;15   CYSTOSCOPY N/A 01/31/2013   Procedure: CYSTOSCOPY FLEXIBLE;  Surgeon: Marissa Nestle, MD;  Location: [facility];  Service: Urology;  Laterality: N/A;  I would like to do this around 1 pm on monday.    HARDWARE REMOVAL Left 06/07/2013   Procedure: HARDWARE REMOVAL;  Surgeon: Mcarthur Rossetti, MD;  Location: [place];  Service: Orthopedics;  Laterality: Left;   INTRAMEDULLARY (IM) NAIL INTERTROCHANTERIC Right 04/14/2019   Procedure: INTRAMEDULLARY (IM) NAIL INTERTROCHANTRIC;  Surgeon: Leandrew Koyanagi, MD;  Location: [place];  Service: Orthopedics;  Laterality: Right;   ORIF HIP FRACTURE Left 11/24/2012   Procedure: OPEN REDUCTION INTERNAL FIXATION HIP;   Surgeon: Sanjuana Kava, MD;  Location: [facility] OR[facility];  Service: Orthopedics;  Laterality: Left;   SPINE SURGERY     TOTAL HIP ARTHROPLASTY Left 06/07/2013   Procedure: REMOVE HARDWARE LEFT HIP AND LEFT TOTAL HIP ARTHROPLASTY ANTERIOR APPROACH;  Surgeon: Mcarthur Rossetti, MD;  Location: [place];  Service: Orthopedics;  Laterality: Left;   TRANSURETHRAL RESECTION OF PROSTATE N/A 03/22/2013   Procedure: TRANSURETHRAL RESECTION OF THE PROSTATE (TURP);  Surgeon: Marissa Nestle, MD;  Location: [facility];  Service: Urology;  Laterality: N/A;       Family History  Problem Relation Age of Onset   Mental illness Sister    Colon cancer Neg Hx     Social History   Tobacco Use   Smoking status: Every Day    Packs/day: 0.50    Years: 30.00    Pack years: 15.00    Types: Cigarettes   Smokeless tobacco: Never  Vaping Use   Vaping Use: Never used  Substance Use Topics   Alcohol use: Not Currently    Comment: drinks about a liter of ETOH/day   Drug use: Not Currently    Types: Marijuana, Cocaine    Comment: Per patient "years ago" 6/20    Home Medications Prior to Admission medications   Medication Sig Start Date End Date Taking? Authorizing Provider  diclofenac Sodium (VOLTAREN) 1 % GEL Apply 2 g topically 4 (four) times daily. 11/04/20  Yes Tacy Learn, PA-C  acetaminophen (TYLENOL) 325 MG tablet Take 650 mg by mouth every 6 (six) hours as needed.    [provider]  ALPRAZolam Duanne Moron) 0.5 MG tablet Take 1 tablet (0.5 mg total) by mouth 3 (three) times daily as needed. 05/05/19   Shelly Coss, MD  amLODipine (NORVASC) 10 MG tablet TAKE ONE TABLET BY MOUTH ONCE DAILY. Patient taking differently: Take 10 mg by mouth daily. 08/05/16   Susy Frizzle, MD  aspirin 325 MG EC tablet Take 1 tablet (325 mg total) by mouth daily. 05/05/19   Shelly Coss, MD  Carboxymethylcellul-Glycerin (CLEAR EYES FOR DRY EYES) 1-0.25 % SOLN Apply 1-2 drops to eye 2 (two) times daily as needed.     [provider]  docusate sodium (COLACE) 100 MG capsule Take 1 capsule (100 mg total) by mouth 2 (two) times daily. 05/05/19   Shelly Coss, MD  escitalopram (LEXAPRO) 10 MG tablet Take 10 mg by mouth daily. 12/28/19   [provider]  folic acid (FOLVITE) 1 MG tablet Take 1 tablet (1 mg total) by mouth daily. 05/05/19   Shelly Coss, MD  hydrOXYzine (VISTARIL) 25 MG capsule Take 25 mg by mouth 3 (three) times daily. 07/02/20   [provider]  ketoconazole (NIZORAL) 2 % cream Apply 1 application topically 2 (two) times daily. 12/29/19   McDonald, Stephan Minister, DPM  loratadine (CLARITIN) 10 MG tablet Take 10 mg by mouth daily. 02/08/19   [provider]  polyethylene glycol (  MIRALAX / GLYCOLAX) 17 g packet Take 17 g by mouth 2 (two) times daily. 05/05/19   Shelly Coss, MD  PROAIR HFA 108 (90 Base) MCG/ACT inhaler INHALE 2 PUFFS INTO THE LUNGS EVERY 4 TO 6 HOURS AS NEEDED. Patient taking differently: Inhale 2 puffs into the lungs every 4 (four) hours as needed for wheezing or shortness of breath. 08/28/16   Susy Frizzle, MD  SYMBICORT 160-4.5 MCG/ACT inhaler Inhale 1 puff into the lungs 2 (two) times daily. 02/08/19   [provider]  thiamine 100 MG tablet Take 1 tablet (100 mg total) by mouth daily. 05/05/19   Shelly Coss, MD  tiZANidine (ZANAFLEX) 2 MG tablet Take 2 mg by mouth 2 (two) times daily as needed. 05/31/19   [provider]  vitamin B-12 1000 MCG tablet Take 1 tablet (1,000 mcg total) by mouth daily. 05/05/19   Shelly Coss, MD    Allergies    Patient has no known allergies.  Review of Systems   Review of Systems  Constitutional:  Negative for fever.  Respiratory:  Positive for cough. Negative for shortness of breath.   Cardiovascular:  Negative for chest pain.  Gastrointestinal:  Negative for abdominal pain, constipation, diarrhea, nausea and vomiting.  Genitourinary:  Negative for dysuria.  Musculoskeletal:  Positive  for back pain.  Skin:  Negative for rash and wound.  Allergic/Immunologic: Negative for immunocompromised state.  Neurological:  Positive for weakness. Negative for dizziness and headaches.  Hematological:  Does not bruise/bleed easily.  Psychiatric/Behavioral:  Negative for confusion.   All other systems reviewed and are negative.  Physical Exam Updated Vital Signs BP (!) 163/100   Pulse (!) 102   Temp 98.5 F (36.9 C) (Oral)   Resp (!) 23   Wt 65.8 kg   SpO2 99%   BMI 19.67 kg/m   Physical Exam Vitals and nursing note reviewed.  Constitutional:      General: He is not in acute distress.    Appearance: He is well-developed. He is not diaphoretic.  HENT:     Head: Normocephalic and atraumatic.     Mouth/Throat:     Mouth: Mucous membranes are dry.  Eyes:     Extraocular Movements: Extraocular movements intact.     Pupils: Pupils are equal, round, and reactive to light.  Cardiovascular:     Rate and Rhythm: Normal rate and regular rhythm.     Heart sounds: Normal heart sounds.  Pulmonary:     Effort: Pulmonary effort is normal.     Breath sounds: Examination of the right-lower field reveals rhonchi. Rhonchi present.  Abdominal:     Palpations: Abdomen is soft.     Tenderness: There is no abdominal tenderness.  Musculoskeletal:        General: Tenderness present. No deformity.     Cervical back: Normal range of motion and neck supple. No tenderness or bony tenderness.     Thoracic back: No tenderness or bony tenderness.     Lumbar back: Tenderness present. No bony tenderness.       Back:     Right lower leg: No edema.     Left lower leg: No edema.  Skin:    General: Skin is warm and dry.     Findings: No erythema.  Neurological:     Mental Status: He is alert and oriented to person, place, and time.     Sensory: No sensory deficit.     Motor: Tremor present. No weakness.  Comments: Upper extremity tremor at task  Psychiatric:        Behavior: Behavior  normal.    ED Results / Procedures / Treatments   Labs (all labs ordered are listed, but only abnormal results are displayed) Labs Reviewed  COMPREHENSIVE METABOLIC PANEL - Abnormal; Notable for the following components:      Result Value   CO2 21 (*)    Glucose, Bld 199 (*)    BUN <5 (*)    Calcium 7.8 (*)    Albumin 3.2 (*)    AST 100 (*)    Total Bilirubin 1.5 (*)    All other components within normal limits  CBC WITH DIFFERENTIAL/PLATELET - Abnormal; Notable for the following components:   RBC 3.89 (*)    MCV 100.8 (*)    MCH 34.2 (*)    RDW 15.9 (*)    All other components within normal limits  RESP PANEL BY RT-PCR (FLU A&B, COVID) ARPGX2  ETHANOL  URINALYSIS, ROUTINE W REFLEX MICROSCOPIC  RAPID URINE DRUG SCREEN, HOSP PERFORMED    EKG EKG Interpretation  Date/Time:  Sunday November 04 2020 09:33:25 EDT Ventricular Rate:  96 PR Interval:  145 QRS Duration: 90 QT Interval:  403 QTC Calculation: 510 R Axis:   17 Text Interpretation: Sinus rhythm Low voltage, extremity leads Prolonged QT interval Artifact in lead(s) V3 V5 QT interval similar to prior EKG Confirmed by Noemi Chapel [phone]) on 11/04/2020 9:36:34 AM  Radiology DG Chest 1 View  Result Date: 11/04/2020 CLINICAL DATA:  Low back pain post fall. EXAM: CHEST  1 VIEW COMPARISON:  Jul 02, 2019 FINDINGS: Cardiomediastinal silhouette is normal. Mediastinal contours appear intact. There is no evidence of focal airspace consolidation, pleural effusion or pneumothorax. Chronic appearing deformities of the left lateral ribs. Soft tissues are grossly normal. IMPRESSION: No active disease. Electronically Signed   By: Fidela Salisbury M.D.   On: 11/04/2020 10:58   DG Lumbar Spine Complete  Result Date: 11/04/2020 CLINICAL DATA:  Lower back pain after falling yesterday afternoon EXAM: LUMBAR SPINE - COMPLETE 4+ VIEW COMPARISON:  Prior CT abdomen/pelvis 04/23/2019 FINDINGS: Compression deformity at L1 with  approximately 20% height loss remains unchanged compared to prior cross-sectional imaging from 04/23/2019. Otherwise, the vertebral body heights are intact. No evidence of acute fracture. No significant loss of disc space height. There is some sclerosis and hypertrophy of the lower lumbar facets suggesting mild facet arthropathy. No lytic or blastic osseous lesion. Atherosclerotic calcifications are present in the abdominal aorta. Incompletely imaged left hip arthroplasty prosthesis and right hip ORIF hardware. IMPRESSION: 1. No acute fracture or malalignment. 2. Stable chronic L1 compression fracture with approximately 20% height loss. 3. Mild lower lumbar facet arthropathy. 4. Aortic atherosclerotic calcifications. Electronically Signed   By: Jacqulynn Cadet M.D.   On: 11/04/2020 10:54   CT Head Wo Contrast  Result Date: 11/04/2020 CLINICAL DATA:  Right low back pain after a fall yesterday. EXAM: CT HEAD WITHOUT CONTRAST CT CERVICAL SPINE WITHOUT CONTRAST TECHNIQUE: Multidetector CT imaging of the head and cervical spine was performed following the standard protocol without intravenous contrast. Multiplanar CT image reconstructions of the cervical spine were also generated. COMPARISON:  08/31/2017 head CT. FINDINGS: CT HEAD FINDINGS Brain: Mild low density in the periventricular white matter likely related to small vessel disease. Mildly age advanced cerebral and cerebellar atrophy. Remote lacunar infarct in the left thalamus. No mass lesion, hemorrhage, hydrocephalus, acute infarct, intra-axial, or extra-axial fluid collection. Vascular: No hyperdense vessel or  unexpected calcification. Intracranial atherosclerosis. Skull: No significant soft tissue swelling.  No skull fracture. Sinuses/Orbits: Normal imaged portions of the orbits and globes. Hypoplastic frontal sinuses. Other mastoid air cells and paranasal sinuses clear. Cerumen in the left external ear canal. Other: None. CT CERVICAL SPINE FINDINGS  Alignment: Spinal visualization through the bottom of T1. Maintenance of vertebral body height and alignment. Skull base and vertebrae: Skull base intact. No acute fracture. Bilateral facet arthropathy, including at C4-5 and on the right and C2-3 on the left. Coronal reformats demonstrate a normal C1-C2 articulation. Soft tissues and spinal canal: Right carotid atherosclerosis. No prevertebral soft tissue swelling. Disc levels: Loss of intervertebral disc height at C4-5 through C6-7. Disc osteophyte complex at C3-4. At C4-5, multifactorial mild central canal and right greater than left neural foraminal narrowing. Significant central canal and neural foraminal narrowing secondary to disc osteophyte complex and uncovertebral joint hypertrophy involves C5-6 and less so C6-7. Upper chest: No apical pneumothorax.  Centrilobular emphysema. Other: None. IMPRESSION: 1. No acute intracranial abnormality. 2. Cerebral atrophy and small vessel ischemic change. 3. Advanced cervical spondylosis, without acute fracture or subluxation. 4.  Emphysema (ICD10-J43.9). Electronically Signed   By: Abigail Miyamoto M.D.   On: 11/04/2020 10:36   CT Cervical Spine Wo Contrast  Result Date: 11/04/2020 CLINICAL DATA:  Right low back pain after a fall yesterday. EXAM: CT HEAD WITHOUT CONTRAST CT CERVICAL SPINE WITHOUT CONTRAST TECHNIQUE: Multidetector CT imaging of the head and cervical spine was performed following the standard protocol without intravenous contrast. Multiplanar CT image reconstructions of the cervical spine were also generated. COMPARISON:  08/31/2017 head CT. FINDINGS: CT HEAD FINDINGS Brain: Mild low density in the periventricular white matter likely related to small vessel disease. Mildly age advanced cerebral and cerebellar atrophy. Remote lacunar infarct in the left thalamus. No mass lesion, hemorrhage, hydrocephalus, acute infarct, intra-axial, or extra-axial fluid collection. Vascular: No hyperdense vessel or  unexpected calcification. Intracranial atherosclerosis. Skull: No significant soft tissue swelling.  No skull fracture. Sinuses/Orbits: Normal imaged portions of the orbits and globes. Hypoplastic frontal sinuses. Other mastoid air cells and paranasal sinuses clear. Cerumen in the left external ear canal. Other: None. CT CERVICAL SPINE FINDINGS Alignment: Spinal visualization through the bottom of T1. Maintenance of vertebral body height and alignment. Skull base and vertebrae: Skull base intact. No acute fracture. Bilateral facet arthropathy, including at C4-5 and on the right and C2-3 on the left. Coronal reformats demonstrate a normal C1-C2 articulation. Soft tissues and spinal canal: Right carotid atherosclerosis. No prevertebral soft tissue swelling. Disc levels: Loss of intervertebral disc height at C4-5 through C6-7. Disc osteophyte complex at C3-4. At C4-5, multifactorial mild central canal and right greater than left neural foraminal narrowing. Significant central canal and neural foraminal narrowing secondary to disc osteophyte complex and uncovertebral joint hypertrophy involves C5-6 and less so C6-7. Upper chest: No apical pneumothorax.  Centrilobular emphysema. Other: None. IMPRESSION: 1. No acute intracranial abnormality. 2. Cerebral atrophy and small vessel ischemic change. 3. Advanced cervical spondylosis, without acute fracture or subluxation. 4.  Emphysema (ICD10-J43.9). Electronically Signed   By: Abigail Miyamoto M.D.   On: 11/04/2020 10:36    Procedures Procedures   Medications Ordered in ED Medications  LORazepam (ATIVAN) injection 0-4 mg ( Intravenous See Alternative 11/04/20 1056)    Or  LORazepam (ATIVAN) tablet 0-4 mg (1 mg Oral Given 11/04/20 1056)  LORazepam (ATIVAN) injection 0-4 mg (has no administration in time range)    Or  LORazepam (ATIVAN) tablet 0-4  mg (has no administration in time range)  thiamine tablet 100 mg (100 mg Oral Given 11/04/20 1056)    Or  thiamine (B-1)  injection 100 mg ( Intravenous See Alternative 11/04/20 1056)  sodium chloride 0.9 % bolus 1,000 mL (1,000 mLs Intravenous New Bag/Given 11/04/20 1056)    ED Course  I have reviewed the triage vital signs and the nursing notes.  Pertinent labs & imaging results that were available during my care of the patient were reviewed by me and considered in my medical decision making (see chart for details).  Clinical Course as of 11/04/20 1140  Sun Sep 11, [id number]  4314 65 year old male presents as above for fall with complaint of right lower back pain.  Patient reports alcohol abuse history, states he did hit his head with brief loss of consciousness.  CT head and CT C-spine obtained for this reason and are negative for acute injury.  X-ray of the spine for right low back pain shows known compression deformity otherwise unremarkable.  Labs obtained due to his syncopal event and are without significant findings including CBC, CMP.  He is COVID and flu negative. Patient is discharged with prescription for Voltaren gel and advised to follow-up with his primary care provider. [LM]    Clinical Course User Index [LM] Roque Lias   MDM Rules/Calculators/A&P                           Final Clinical Impression(s) / ED Diagnoses Final diagnoses:  Fall, initial encounter  Acute right-sided low back pain without sciatica    Rx / DC Orders ED Discharge Orders          Ordered    diclofenac Sodium (VOLTAREN) 1 % GEL  4 times daily        11/04/20 1139             Tacy Learn, PA-C 11/04/20 1141    Noemi Chapel, MD 11/05/20 [phone]

## 2020-11-04 NOTE — ED Notes (Signed)
Pt discharged from the ED but his walker is at home and he has nobody who can pick him up. He currently does not qualify for EMS transport. Called [facility] for ride home but they state they do not transport pts home on the weekends, only the weekdays. Working on obtaining ride for pt so he can discharge safely

## 2020-11-04 NOTE — Discharge Instructions (Addendum)
Your CT scans, x-rays, lab work today are all generally reassuring.  You can apply Voltaren to your right lower back for pain as needed as prescribed.  Recommend follow-up with your primary care provider.

## 2020-11-04 NOTE — ED Triage Notes (Addendum)
Pt c/o RT lower back pain after a fall yesterday. Pt states that he fell due to excessive ETOH consumption. Denies LOC or hitting head. Also endorses generalized weakness. Pt noted to have tremor.

## 2021-05-24 ENCOUNTER — Ambulatory Visit (INDEPENDENT_AMBULATORY_CARE_PROVIDER_SITE_OTHER): Payer: Medicare Other | Admitting: Podiatry

## 2021-05-24 ENCOUNTER — Encounter: Payer: Self-pay | Admitting: Podiatry

## 2021-05-24 DIAGNOSIS — F17200 Nicotine dependence, unspecified, uncomplicated: Secondary | ICD-10-CM

## 2021-05-24 DIAGNOSIS — M79675 Pain in left toe(s): Secondary | ICD-10-CM

## 2021-05-24 DIAGNOSIS — B351 Tinea unguium: Secondary | ICD-10-CM

## 2021-05-24 DIAGNOSIS — M79674 Pain in right toe(s): Secondary | ICD-10-CM

## 2021-05-24 NOTE — Progress Notes (Signed)
This patient returns to my office for at risk foot care.  This patient requires this care by a professional since this patient will be at risk due to having thrombocytopenia.  Patient presents with long thick nails which are painful walking and wearing his shoes.  This patient is unable to cut nails himself since the patient cannot reach his nails.   Dry scaly skin on bottom of feet. Marland Kitchen  He says he is unable to apply the moisturizer and the home health services has stopped coming to his house..  This patient presents for at risk foot care today. ? ?General Appearance  Alert, conversant and in no acute stress. ? ?Vascular  Dorsalis pedis and posterior tibial  pulses are weakly  palpable  bilaterally.  Capillary return is within normal limits  bilaterally. Cold feet bilaterally.  Absent digital hair  B/L. ? ?Neurologic  Senn-Weinstein monofilament wire test diminished  bilaterally. Muscle power within normal limits bilaterally. ? ?Nails Thick disfigured discolored nails with subungual debris  from hallux to fifth toes bilaterally. No evidence of bacterial infection or drainage bilaterally. ? ?Orthopedic  No limitations of motion  feet .  No crepitus or effusions noted.  No bony pathology or digital deformities noted. ? ?Skin  normotropic skin with no porokeratosis noted bilaterally.  No signs of infections or ulcers noted.   Dry scaly plantar skin  B/L. ? ?Onychomycosis  Pain in right toes  Pain in left toes ? ?Consent was obtained for treatment procedures.   Mechanical debridement of nails 1-5  bilaterally performed with a nail nipper.  Filed with dremel without incident.   I proceeded to apply moisturizer on his feet and allowed the moisturizer to sit on his feet    ? ? ?Return office visit    3 months                 Told patient to return for periodic foot care and evaluation due to potential at risk complications. ? ? ?Gardiner Barefoot DPM   ?

## 2021-07-03 ENCOUNTER — Emergency Department (HOSPITAL_COMMUNITY)
Admission: EM | Admit: 2021-07-03 | Discharge: 2021-07-03 | Disposition: A | Discharge status: Home / Self Care | Payer: Medicare Other | Attending: Emergency Medicine | Admitting: Emergency Medicine

## 2021-07-03 ENCOUNTER — Other Ambulatory Visit: Payer: Self-pay

## 2021-07-03 ENCOUNTER — Encounter (HOSPITAL_COMMUNITY): Payer: Self-pay

## 2021-07-03 ENCOUNTER — Emergency Department (HOSPITAL_COMMUNITY): Payer: Medicare Other

## 2021-07-03 ENCOUNTER — Emergency Department (HOSPITAL_COMMUNITY)
Admission: EM | Admit: 2021-07-03 | Discharge: 2021-07-04 | Disposition: A | Discharge status: Home / Self Care | Payer: Medicare Other | Source: Home / Self Care | Attending: Emergency Medicine | Admitting: Emergency Medicine

## 2021-07-03 DIAGNOSIS — S80212A Abrasion, left knee, initial encounter: Secondary | ICD-10-CM | POA: Insufficient documentation

## 2021-07-03 DIAGNOSIS — S0990XA Unspecified injury of head, initial encounter: Secondary | ICD-10-CM | POA: Insufficient documentation

## 2021-07-03 DIAGNOSIS — I10 Essential (primary) hypertension: Secondary | ICD-10-CM | POA: Insufficient documentation

## 2021-07-03 DIAGNOSIS — Z7982 Long term (current) use of aspirin: Secondary | ICD-10-CM | POA: Insufficient documentation

## 2021-07-03 DIAGNOSIS — Y9301 Activity, walking, marching and hiking: Secondary | ICD-10-CM | POA: Insufficient documentation

## 2021-07-03 DIAGNOSIS — Z79899 Other long term (current) drug therapy: Secondary | ICD-10-CM | POA: Diagnosis not present

## 2021-07-03 DIAGNOSIS — W19XXXA Unspecified fall, initial encounter: Secondary | ICD-10-CM | POA: Insufficient documentation

## 2021-07-03 DIAGNOSIS — S7012XA Contusion of left thigh, initial encounter: Secondary | ICD-10-CM | POA: Diagnosis not present

## 2021-07-03 DIAGNOSIS — S8992XA Unspecified injury of left lower leg, initial encounter: Secondary | ICD-10-CM | POA: Diagnosis present

## 2021-07-03 DIAGNOSIS — M25562 Pain in left knee: Secondary | ICD-10-CM

## 2021-07-03 DIAGNOSIS — M79605 Pain in left leg: Secondary | ICD-10-CM

## 2021-07-03 MED ORDER — ACETAMINOPHEN 325 MG PO TABS
650.0000 mg | ORAL_TABLET | Freq: Once | ORAL | Status: AC
  Administered 2021-07-03: 650 mg via ORAL
  Filled 2021-07-03: qty 2

## 2021-07-03 NOTE — Discharge Instructions (Signed)
Call your primary care doctor or specialist as discussed in the next 2-3 days.   Return immediately back to the ER if:  Your symptoms worsen within the next 12-24 hours. You develop new symptoms such as new fevers, persistent vomiting, new pain, shortness of breath, or new weakness or numbness, or if you have any other concerns.  

## 2021-07-03 NOTE — ED Triage Notes (Signed)
Pt presents to ED via [facility] after fall from wheelchair. Pt states he fell out of his wheelchair going down his ramp, hit his head, unsure LOC. Pt with ETOH on board. Pt c/o left knee pain.  ?

## 2021-07-03 NOTE — ED Provider Notes (Signed)
?[facility] ?Provider Note ? ? ?CSN: [id number] ?Arrival date & time: 07/03/21  1656 ? ?  ? ?History ? ?Chief Complaint  ?Patient presents with  ? Fall  ? ? ?Duane Richardson is a 66 y.o. male. ? ?Patient presents with chief complaint of a fall off his wheelchair.  Admits to using alcohol today.  He is not sure exactly how the fall happened.  Complaining of left knee pain.  Denies loss of consciousness neck pain or back pain or abdominal pain or other extremity pain. ? ? ?  ? ?Home Medications ?Prior to Admission medications   ?Medication Sig Start Date End Date Taking? Authorizing Provider  ?acetaminophen (TYLENOL) 325 MG tablet Take 650 mg by mouth every 6 (six) hours as needed.    [provider]  ?ALPRAZolam Duanne Moron) 0.5 MG tablet Take 1 tablet (0.5 mg total) by mouth 3 (three) times daily as needed. 05/05/19   Shelly Coss, MD  ?amLODipine (NORVASC) 10 MG tablet TAKE ONE TABLET BY MOUTH ONCE DAILY. ?Patient taking differently: Take 10 mg by mouth daily. 08/05/16   Susy Frizzle, MD  ?aspirin 325 MG EC tablet Take 1 tablet (325 mg total) by mouth daily. 05/05/19   Shelly Coss, MD  ?Carboxymethylcellul-Glycerin (CLEAR EYES FOR DRY EYES) 1-0.25 % SOLN Apply 1-2 drops to eye 2 (two) times daily as needed.    [provider]  ?diclofenac Sodium (VOLTAREN) 1 % GEL Apply 2 g topically 4 (four) times daily. 11/04/20   Tacy Learn, PA-C  ?docusate sodium (COLACE) 100 MG capsule Take 1 capsule (100 mg total) by mouth 2 (two) times daily. 05/05/19   Shelly Coss, MD  ?escitalopram (LEXAPRO) 10 MG tablet Take 10 mg by mouth daily. 12/28/19   [provider]  ?folic acid (FOLVITE) 1 MG tablet Take 1 tablet (1 mg total) by mouth daily. 05/05/19   Shelly Coss, MD  ?hydrOXYzine (VISTARIL) 25 MG capsule Take 25 mg by mouth 3 (three) times daily. 07/02/20   [provider]  ?ketoconazole (NIZORAL) 2 % cream Apply 1 application topically 2 (two) times daily. 12/29/19    McDonald, Stephan Minister, DPM  ?loratadine (CLARITIN) 10 MG tablet Take 10 mg by mouth daily. 02/08/19   [provider]  ?polyethylene glycol (MIRALAX / GLYCOLAX) 17 g packet Take 17 g by mouth 2 (two) times daily. 05/05/19   Shelly Coss, MD  ?PROAIR HFA 108 (90 Base) MCG/ACT inhaler INHALE 2 PUFFS INTO THE LUNGS EVERY 4 TO 6 HOURS AS NEEDED. ?Patient taking differently: Inhale 2 puffs into the lungs every 4 (four) hours as needed for wheezing or shortness of breath. 08/28/16   Susy Frizzle, MD  ?Dellis Anes 160-4.5 MCG/ACT inhaler Inhale 1 puff into the lungs 2 (two) times daily. 02/08/19   [provider]  ?thiamine 100 MG tablet Take 1 tablet (100 mg total) by mouth daily. 05/05/19   Shelly Coss, MD  ?tiZANidine (ZANAFLEX) 2 MG tablet Take 2 mg by mouth 2 (two) times daily as needed. 05/31/19   [provider]  ?vitamin B-12 1000 MCG tablet Take 1 tablet (1,000 mcg total) by mouth daily. 05/05/19   Shelly Coss, MD  ?   ? ?Allergies    ?Patient has no known allergies.   ? ?Review of Systems   ?Review of Systems  ?Constitutional:  Negative for fever.  ?HENT:  Negative for ear pain and sore throat.   ?Eyes:  Negative for pain.  ?Respiratory:  Negative  for cough.   ?Cardiovascular:  Negative for chest pain.  ?Gastrointestinal:  Negative for abdominal pain.  ?Genitourinary:  Negative for flank pain.  ?Musculoskeletal:  Negative for back pain.  ?Skin:  Negative for color change and rash.  ?Neurological:  Negative for syncope.  ?All other systems reviewed and are negative. ? ?Physical Exam ?Updated Vital Signs ?BP 100/73 (BP Location: Right Arm)   Pulse 100   Temp 98.7 ?F (37.1 ?C) (Oral)   Ht 6\' 2"  (1.88 m)   Wt 59 kg   SpO2 97%   BMI 16.69 kg/m?  ?Physical Exam ?Constitutional:   ?   Appearance: He is well-developed.  ?HENT:  ?   Head: Normocephalic and atraumatic.  ?   Nose: Nose normal.  ?Eyes:  ?   Extraocular Movements: Extraocular movements intact.  ?Cardiovascular:  ?   Rate  and Rhythm: Normal rate.  ?Pulmonary:  ?   Effort: Pulmonary effort is normal.  ?Musculoskeletal:  ?   Comments: No C or T or L-spine midline step-offs or tenderness. ? ?Mild swelling noted and abrasion to the left knee.  Pain with attempted range of motion of the left knee. ? ?  ?Skin: ?   Coloration: Skin is not jaundiced.  ?Neurological:  ?   Mental Status: He is alert. Mental status is at baseline.  ? ? ?ED Results / Procedures / Treatments   ?Labs ?(all labs ordered are listed, but only abnormal results are displayed) ?Labs Reviewed - No data to display ? ?EKG ?None ? ?Radiology ?CT Head Wo Contrast ? ?Result Date: 07/03/2021 ?CLINICAL DATA:  Fall from wheelchair, hit head EXAM: CT HEAD WITHOUT CONTRAST TECHNIQUE: Contiguous axial images were obtained from the base of the skull through the vertex without intravenous contrast. RADIATION DOSE REDUCTION: This exam was performed according to the departmental dose-optimization program which includes automated exposure control, adjustment of the mA and/or kV according to patient size and/or use of iterative reconstruction technique. COMPARISON:  None Available. FINDINGS: Brain: No evidence of acute infarction, hemorrhage, cerebral edema, mass, mass effect, or midline shift. No hydrocephalus or extra-axial fluid collection. Vascular: No hyperdense vessel. Atherosclerotic calcifications in the intracranial carotid and vertebral arteries. Skull: Normal. Negative for fracture or focal lesion. Sinuses/Orbits: No acute finding. Other: The mastoid air cells are well aerated. IMPRESSION: No acute intracranial process. Electronically Signed   By: Merilyn Baba M.D.   On: 07/03/2021 18:37  ? ?DG Knee Complete 4 Views Left ? ?Result Date: 07/03/2021 ?CLINICAL DATA:  Left knee pain EXAM: LEFT KNEE - COMPLETE 4+ VIEW COMPARISON:  None Available. FINDINGS: Normal alignment. No acute fracture or dislocation. Healed proximal fibular metadiaphyseal fracture with no significant residual  deformity. Joint spaces appear preserved. No effusion. Vascular calcifications are noted. IMPRESSION: No acute abnormality Electronically Signed   By: Fidela Salisbury M.D.   On: 07/03/2021 18:28   ? ?Procedures ?Mar[facility] KitchenOrtho Injury Treatment ? ?Date/Time: 07/03/2021 7:05 PM ?Performed by: Luna Fuse, MD ?Authorized by: Luna Fuse, MD  ?Post-procedure neurovascular assessment: post-procedure neurovascularly intact ?Comments: Ace wrap to left knee. ? ?  ? ? ?Medications Ordered in ED ?Medications  ?acetaminophen (TYLENOL) tablet 650 mg (650 mg Oral Given 07/03/21 1822)  ? ? ?ED Course/ Medical Decision Making/ A&P ?  ?                        ?Medical Decision Making ?Amount and/or Complexity of Data Reviewed ?Radiology: ordered. ? ?Risk ?OTC drugs. ? ? ?  Chart review shows office visit May 24, 2021. ? ?Diagnosis is include x-rays and CT imaging which were unremarkable for acute findings.  Patient given Tylenol for symptom management. ? ?Ace wrap to the left knee for symptomatic as well. ? ?Discharged home stable condition. ? ? ? ? ? ? ? ?Final Clinical Impression(s) / ED Diagnoses ?Final diagnoses:  ?Fall, initial encounter  ?Acute pain of left knee  ? ? ?Rx / DC Orders ?ED Discharge Orders   ? ? None  ? ?  ? ? ?  ?Luna Fuse, MD ?07/03/21 1906 ? ?

## 2021-07-03 NOTE — ED Triage Notes (Signed)
Pt c/o left leg pain after a mechanical fall earlier today. Seen for same today as well.  ?

## 2021-07-03 NOTE — ED Notes (Signed)
Applied ace wrap to left knee ?

## 2021-07-04 ENCOUNTER — Emergency Department (HOSPITAL_COMMUNITY): Payer: Medicare Other

## 2021-07-04 ENCOUNTER — Other Ambulatory Visit: Payer: Self-pay

## 2021-07-04 ENCOUNTER — Encounter (HOSPITAL_COMMUNITY): Payer: Self-pay

## 2021-07-04 DIAGNOSIS — S80212A Abrasion, left knee, initial encounter: Secondary | ICD-10-CM | POA: Diagnosis not present

## 2021-07-04 NOTE — ED Provider Notes (Signed)
?[facility] ?Provider Note ? ? ?CSN: [id number] ?Arrival date & time: 07/03/21  2355 ? ?  ? ?History ? ?Chief Complaint  ?Patient presents with  ? Leg Pain  ? ? ?Duane Richardson is a 66 y.o. male. ? ?Patient presents for evaluation after a fall.  Patient evaluated in the emergency department earlier today for a fall.  Patient reports he was discharged and fell while walking outside.  Comes in by EMS with complaints of left thigh pain.  No head injury, neck pain, back pain. ? ? ?  ? ?Home Medications ?Prior to Admission medications   ?Medication Sig Start Date End Date Taking? Authorizing Provider  ?acetaminophen (TYLENOL) 325 MG tablet Take 650 mg by mouth every 6 (six) hours as needed.    [provider]  ?ALPRAZolam Duanne Moron) 0.5 MG tablet Take 1 tablet (0.5 mg total) by mouth 3 (three) times daily as needed. 05/05/19   Shelly Coss, MD  ?amLODipine (NORVASC) 10 MG tablet TAKE ONE TABLET BY MOUTH ONCE DAILY. ?Patient taking differently: Take 10 mg by mouth daily. 08/05/16   Susy Frizzle, MD  ?aspirin 325 MG EC tablet Take 1 tablet (325 mg total) by mouth daily. 05/05/19   Shelly Coss, MD  ?Carboxymethylcellul-Glycerin (CLEAR EYES FOR DRY EYES) 1-0.25 % SOLN Apply 1-2 drops to eye 2 (two) times daily as needed.    [provider]  ?diclofenac Sodium (VOLTAREN) 1 % GEL Apply 2 g topically 4 (four) times daily. 11/04/20   Tacy Learn, PA-C  ?docusate sodium (COLACE) 100 MG capsule Take 1 capsule (100 mg total) by mouth 2 (two) times daily. 05/05/19   Shelly Coss, MD  ?escitalopram (LEXAPRO) 10 MG tablet Take 10 mg by mouth daily. 12/28/19   [provider]  ?folic acid (FOLVITE) 1 MG tablet Take 1 tablet (1 mg total) by mouth daily. 05/05/19   Shelly Coss, MD  ?hydrOXYzine (VISTARIL) 25 MG capsule Take 25 mg by mouth 3 (three) times daily. 07/02/20   [provider]  ?ketoconazole (NIZORAL) 2 % cream Apply 1 application topically 2 (two) times daily.  12/29/19   McDonald, Stephan Minister, DPM  ?loratadine (CLARITIN) 10 MG tablet Take 10 mg by mouth daily. 02/08/19   [provider]  ?polyethylene glycol (MIRALAX / GLYCOLAX) 17 g packet Take 17 g by mouth 2 (two) times daily. 05/05/19   Shelly Coss, MD  ?PROAIR HFA 108 (90 Base) MCG/ACT inhaler INHALE 2 PUFFS INTO THE LUNGS EVERY 4 TO 6 HOURS AS NEEDED. ?Patient taking differently: Inhale 2 puffs into the lungs every 4 (four) hours as needed for wheezing or shortness of breath. 08/28/16   Susy Frizzle, MD  ?Dellis Anes 160-4.5 MCG/ACT inhaler Inhale 1 puff into the lungs 2 (two) times daily. 02/08/19   [provider]  ?thiamine 100 MG tablet Take 1 tablet (100 mg total) by mouth daily. 05/05/19   Shelly Coss, MD  ?tiZANidine (ZANAFLEX) 2 MG tablet Take 2 mg by mouth 2 (two) times daily as needed. 05/31/19   [provider]  ?vitamin B-12 1000 MCG tablet Take 1 tablet (1,000 mcg total) by mouth daily. 05/05/19   Shelly Coss, MD  ?   ? ?Allergies    ?Patient has no known allergies.   ? ?Review of Systems   ?Review of Systems ? ?Physical Exam ?Updated Vital Signs ?BP (!) 126/104   Pulse 89   Temp 97.9 ?F (36.6 ?C) (Oral)   Resp 18  Ht 6\' 2"  (1.88 m)   Wt 59 kg   SpO2 100%   BMI 16.70 kg/m?  ?Physical Exam ?Vitals and nursing note reviewed.  ?Constitutional:   ?   General: He is not in acute distress. ?   Appearance: He is well-developed.  ?HENT:  ?   Head: Normocephalic and atraumatic.  ?   Mouth/Throat:  ?   Mouth: Mucous membranes are moist.  ?Eyes:  ?   General: Vision grossly intact. Gaze aligned appropriately.  ?   Extraocular Movements: Extraocular movements intact.  ?   Conjunctiva/sclera: Conjunctivae normal.  ?Cardiovascular:  ?   Rate and Rhythm: Normal rate and regular rhythm.  ?   Pulses:     ?     Dorsalis pedis pulses are detected w/ Doppler on the left side.  ?   Heart sounds: Normal heart sounds, S1 normal and S2 normal. No murmur heard. ?  No friction rub. No gallop.   ?Pulmonary:  ?   Effort: Pulmonary effort is normal. No respiratory distress.  ?   Breath sounds: Normal breath sounds.  ?Abdominal:  ?   Palpations: Abdomen is soft.  ?   Tenderness: There is no abdominal tenderness. There is no guarding or rebound.  ?   Hernia: No hernia is present.  ?Musculoskeletal:     ?   General: No swelling.  ?   Cervical back: Full passive range of motion without pain, normal range of motion and neck supple. No pain with movement, spinous process tenderness or muscular tenderness. Normal range of motion.  ?   Left hip: No deformity. Normal range of motion.  ?   Left upper leg: Tenderness present. No swelling, deformity or lacerations.  ?   Right lower leg: No edema.  ?   Left lower leg: No edema.  ?Skin: ?   General: Skin is warm and dry.  ?   Capillary Refill: Capillary refill takes less than 2 seconds.  ?   Findings: No ecchymosis, erythema, lesion or wound.  ?Neurological:  ?   Mental Status: He is alert and oriented to person, place, and time.  ?   GCS: GCS eye subscore is 4. GCS verbal subscore is 5. GCS motor subscore is 6.  ?   Cranial Nerves: Cranial nerves 2-12 are intact.  ?   Sensory: Sensation is intact.  ?   Motor: Motor function is intact. No weakness or abnormal muscle tone.  ?   Coordination: Coordination is intact.  ?Psychiatric:     ?   Mood and Affect: Mood normal.     ?   Speech: Speech normal.     ?   Behavior: Behavior normal.  ? ? ?ED Results / Procedures / Treatments   ?Labs ?(all labs ordered are listed, but only abnormal results are displayed) ?Labs Reviewed - No data to display ? ?EKG ?None ? ?Radiology ?CT Head Wo Contrast ? ?Result Date: 07/03/2021 ?CLINICAL DATA:  Fall from wheelchair, hit head EXAM: CT HEAD WITHOUT CONTRAST TECHNIQUE: Contiguous axial images were obtained from the base of the skull through the vertex without intravenous contrast. RADIATION DOSE REDUCTION: This exam was performed according to the departmental dose-optimization program which  includes automated exposure control, adjustment of the mA and/or kV according to patient size and/or use of iterative reconstruction technique. COMPARISON:  None Available. FINDINGS: Brain: No evidence of acute infarction, hemorrhage, cerebral edema, mass, mass effect, or midline shift. No hydrocephalus or extra-axial fluid collection. Vascular: No hyperdense  vessel. Atherosclerotic calcifications in the intracranial carotid and vertebral arteries. Skull: Normal. Negative for fracture or focal lesion. Sinuses/Orbits: No acute finding. Other: The mastoid air cells are well aerated. IMPRESSION: No acute intracranial process. Electronically Signed   By: Merilyn Baba M.D.   On: 07/03/2021 18:37  ? ?DG Knee Complete 4 Views Left ? ?Result Date: 07/03/2021 ?CLINICAL DATA:  Left knee pain EXAM: LEFT KNEE - COMPLETE 4+ VIEW COMPARISON:  None Available. FINDINGS: Normal alignment. No acute fracture or dislocation. Healed proximal fibular metadiaphyseal fracture with no significant residual deformity. Joint spaces appear preserved. No effusion. Vascular calcifications are noted. IMPRESSION: No acute abnormality Electronically Signed   By: Fidela Salisbury M.D.   On: 07/03/2021 18:28   ? ?Procedures ?Procedures  ? ? ?Medications Ordered in ED ?Medications - No data to display ? ?ED Course/ Medical Decision Making/ A&P ?  ?                        ?Medical Decision Making ?Amount and/or Complexity of Data Reviewed ?Radiology: ordered. ? ? ?Presents for evaluation after a fall.  Patient indicates pain in the area of the left thigh.  Examination does not reveal any deformity.  X-ray of hip and femur performed, no fracture noted.  No other injury on evaluation. ? ? ? ? ? ? ? ?Final Clinical Impression(s) / ED Diagnoses ?Final diagnoses:  ?Left leg pain  ?Contusion of left thigh, initial encounter  ? ? ?Rx / DC Orders ?ED Discharge Orders   ? ? None  ? ?  ? ? ?  ?Orpah Greek, MD ?07/04/21 0040 ? ?

## 2021-08-28 ENCOUNTER — Encounter: Payer: Self-pay | Admitting: Podiatry

## 2021-08-28 ENCOUNTER — Ambulatory Visit (INDEPENDENT_AMBULATORY_CARE_PROVIDER_SITE_OTHER): Payer: Medicare Other | Admitting: Podiatry

## 2021-08-28 DIAGNOSIS — I7090 Unspecified atherosclerosis: Secondary | ICD-10-CM | POA: Diagnosis not present

## 2021-08-28 DIAGNOSIS — M79675 Pain in left toe(s): Secondary | ICD-10-CM

## 2021-08-28 DIAGNOSIS — B351 Tinea unguium: Secondary | ICD-10-CM | POA: Diagnosis not present

## 2021-08-28 DIAGNOSIS — M79674 Pain in right toe(s): Secondary | ICD-10-CM

## 2021-08-28 DIAGNOSIS — L853 Xerosis cutis: Secondary | ICD-10-CM | POA: Diagnosis not present

## 2021-08-28 NOTE — Progress Notes (Signed)
This patient returns to my office for at risk foot care.  This patient requires this care by a professional since this patient will be at risk due to having thrombocytopenia.  Patient presents with long thick nails which are painful walking and wearing his shoes.  This patient is unable to cut nails himself since the patient cannot reach his nails.   Dry scaly skin on bottom of feet. Marland Kitchen  He says he has been applying eucerin to his skin.  This patient presents for at risk foot care today.  General Appearance  Alert, conversant and in no acute stress.  Vascular  Dorsalis pedis and posterior tibial  pulses are weakly  palpable  bilaterally.  Capillary return is within normal limits  bilaterally. Cold feet bilaterally.  Absent digital hair  B/L.  Neurologic  Senn-Weinstein monofilament wire test diminished  bilaterally. Muscle power within normal limits bilaterally.  Nails Thick disfigured discolored nails with subungual debris  from hallux to fifth toes bilaterally. No evidence of bacterial infection or drainage bilaterally.  Orthopedic  No limitations of motion  feet .  No crepitus or effusions noted.  No bony pathology or digital deformities noted.  Skin  normotropic skin with no porokeratosis noted bilaterally.  No signs of infections or ulcers noted.   Dry scaly plantar skin  B/L. Improved from previous visit.  Onychomycosis  Pain in right toes  Pain in left toes  Consent was obtained for treatment procedures.   Mechanical debridement of nails 1-5  bilaterally performed with a nail nipper.  Filed with dremel without incident.       Return office visit    3 months                 Told patient to return for periodic foot care and evaluation due to potential at risk complications.   Gardiner Barefoot DPM

## 2021-10-31 ENCOUNTER — Inpatient Hospital Stay (HOSPITAL_COMMUNITY)
Admission: EM | Admit: 2021-10-31 | Discharge: 2021-11-24 | DRG: 207 | Disposition: E | Payer: Medicare Other | Attending: Pulmonary Disease | Admitting: Pulmonary Disease

## 2021-10-31 ENCOUNTER — Other Ambulatory Visit: Payer: Self-pay

## 2021-10-31 ENCOUNTER — Emergency Department (HOSPITAL_COMMUNITY): Payer: Medicare Other

## 2021-10-31 ENCOUNTER — Encounter (HOSPITAL_COMMUNITY): Payer: Self-pay | Admitting: Emergency Medicine

## 2021-10-31 DIAGNOSIS — Z515 Encounter for palliative care: Secondary | ICD-10-CM | POA: Diagnosis not present

## 2021-10-31 DIAGNOSIS — R627 Adult failure to thrive: Secondary | ICD-10-CM

## 2021-10-31 DIAGNOSIS — M1990 Unspecified osteoarthritis, unspecified site: Secondary | ICD-10-CM | POA: Diagnosis present

## 2021-10-31 DIAGNOSIS — D72823 Leukemoid reaction: Secondary | ICD-10-CM | POA: Diagnosis not present

## 2021-10-31 DIAGNOSIS — J189 Pneumonia, unspecified organism: Secondary | ICD-10-CM | POA: Diagnosis present

## 2021-10-31 DIAGNOSIS — R918 Other nonspecific abnormal finding of lung field: Secondary | ICD-10-CM | POA: Diagnosis not present

## 2021-10-31 DIAGNOSIS — J910 Malignant pleural effusion: Secondary | ICD-10-CM | POA: Diagnosis present

## 2021-10-31 DIAGNOSIS — R6521 Severe sepsis with septic shock: Secondary | ICD-10-CM | POA: Diagnosis not present

## 2021-10-31 DIAGNOSIS — D638 Anemia in other chronic diseases classified elsewhere: Secondary | ICD-10-CM | POA: Diagnosis present

## 2021-10-31 DIAGNOSIS — J90 Pleural effusion, not elsewhere classified: Secondary | ICD-10-CM | POA: Diagnosis not present

## 2021-10-31 DIAGNOSIS — Z7951 Long term (current) use of inhaled steroids: Secondary | ICD-10-CM

## 2021-10-31 DIAGNOSIS — I469 Cardiac arrest, cause unspecified: Secondary | ICD-10-CM | POA: Diagnosis not present

## 2021-10-31 DIAGNOSIS — F17210 Nicotine dependence, cigarettes, uncomplicated: Secondary | ICD-10-CM | POA: Diagnosis not present

## 2021-10-31 DIAGNOSIS — D72829 Elevated white blood cell count, unspecified: Secondary | ICD-10-CM | POA: Diagnosis not present

## 2021-10-31 DIAGNOSIS — F32A Depression, unspecified: Secondary | ICD-10-CM | POA: Diagnosis present

## 2021-10-31 DIAGNOSIS — R34 Anuria and oliguria: Secondary | ICD-10-CM | POA: Diagnosis not present

## 2021-10-31 DIAGNOSIS — C782 Secondary malignant neoplasm of pleura: Secondary | ICD-10-CM | POA: Diagnosis present

## 2021-10-31 DIAGNOSIS — Z79899 Other long term (current) drug therapy: Secondary | ICD-10-CM

## 2021-10-31 DIAGNOSIS — F17200 Nicotine dependence, unspecified, uncomplicated: Secondary | ICD-10-CM | POA: Diagnosis not present

## 2021-10-31 DIAGNOSIS — E871 Hypo-osmolality and hyponatremia: Secondary | ICD-10-CM

## 2021-10-31 DIAGNOSIS — G9341 Metabolic encephalopathy: Secondary | ICD-10-CM | POA: Diagnosis not present

## 2021-10-31 DIAGNOSIS — E2740 Unspecified adrenocortical insufficiency: Secondary | ICD-10-CM | POA: Diagnosis not present

## 2021-10-31 DIAGNOSIS — R739 Hyperglycemia, unspecified: Secondary | ICD-10-CM | POA: Diagnosis not present

## 2021-10-31 DIAGNOSIS — R823 Hemoglobinuria: Secondary | ICD-10-CM | POA: Diagnosis not present

## 2021-10-31 DIAGNOSIS — C787 Secondary malignant neoplasm of liver and intrahepatic bile duct: Secondary | ICD-10-CM | POA: Diagnosis present

## 2021-10-31 DIAGNOSIS — E8809 Other disorders of plasma-protein metabolism, not elsewhere classified: Secondary | ICD-10-CM

## 2021-10-31 DIAGNOSIS — C7972 Secondary malignant neoplasm of left adrenal gland: Secondary | ICD-10-CM | POA: Diagnosis present

## 2021-10-31 DIAGNOSIS — E870 Hyperosmolality and hypernatremia: Secondary | ICD-10-CM | POA: Diagnosis present

## 2021-10-31 DIAGNOSIS — R208 Other disturbances of skin sensation: Secondary | ICD-10-CM | POA: Diagnosis not present

## 2021-10-31 DIAGNOSIS — E785 Hyperlipidemia, unspecified: Secondary | ICD-10-CM | POA: Diagnosis present

## 2021-10-31 DIAGNOSIS — I10 Essential (primary) hypertension: Secondary | ICD-10-CM | POA: Diagnosis present

## 2021-10-31 DIAGNOSIS — J9589 Other postprocedural complications and disorders of respiratory system, not elsewhere classified: Secondary | ICD-10-CM | POA: Diagnosis not present

## 2021-10-31 DIAGNOSIS — I952 Hypotension due to drugs: Secondary | ICD-10-CM

## 2021-10-31 DIAGNOSIS — N179 Acute kidney failure, unspecified: Secondary | ICD-10-CM | POA: Diagnosis not present

## 2021-10-31 DIAGNOSIS — E43 Unspecified severe protein-calorie malnutrition: Secondary | ICD-10-CM | POA: Diagnosis present

## 2021-10-31 DIAGNOSIS — F418 Other specified anxiety disorders: Secondary | ICD-10-CM | POA: Diagnosis not present

## 2021-10-31 DIAGNOSIS — E46 Unspecified protein-calorie malnutrition: Secondary | ICD-10-CM

## 2021-10-31 DIAGNOSIS — J432 Centrilobular emphysema: Secondary | ICD-10-CM | POA: Diagnosis present

## 2021-10-31 DIAGNOSIS — J9601 Acute respiratory failure with hypoxia: Secondary | ICD-10-CM | POA: Diagnosis not present

## 2021-10-31 DIAGNOSIS — E8770 Fluid overload, unspecified: Secondary | ICD-10-CM | POA: Diagnosis not present

## 2021-10-31 DIAGNOSIS — N390 Urinary tract infection, site not specified: Secondary | ICD-10-CM | POA: Diagnosis not present

## 2021-10-31 DIAGNOSIS — E872 Acidosis, unspecified: Secondary | ICD-10-CM

## 2021-10-31 DIAGNOSIS — Z20822 Contact with and (suspected) exposure to covid-19: Secondary | ICD-10-CM | POA: Diagnosis present

## 2021-10-31 DIAGNOSIS — R079 Chest pain, unspecified: Secondary | ICD-10-CM | POA: Diagnosis not present

## 2021-10-31 DIAGNOSIS — C799 Secondary malignant neoplasm of unspecified site: Secondary | ICD-10-CM | POA: Diagnosis not present

## 2021-10-31 DIAGNOSIS — C3411 Malignant neoplasm of upper lobe, right bronchus or lung: Principal | ICD-10-CM | POA: Diagnosis present

## 2021-10-31 DIAGNOSIS — C3491 Malignant neoplasm of unspecified part of right bronchus or lung: Secondary | ICD-10-CM | POA: Insufficient documentation

## 2021-10-31 DIAGNOSIS — R911 Solitary pulmonary nodule: Secondary | ICD-10-CM | POA: Diagnosis not present

## 2021-10-31 DIAGNOSIS — Z681 Body mass index (BMI) 19 or less, adult: Secondary | ICD-10-CM

## 2021-10-31 DIAGNOSIS — R64 Cachexia: Secondary | ICD-10-CM | POA: Diagnosis present

## 2021-10-31 DIAGNOSIS — Z66 Do not resuscitate: Secondary | ICD-10-CM | POA: Diagnosis not present

## 2021-10-31 DIAGNOSIS — Z818 Family history of other mental and behavioral disorders: Secondary | ICD-10-CM

## 2021-10-31 DIAGNOSIS — C772 Secondary and unspecified malignant neoplasm of intra-abdominal lymph nodes: Secondary | ICD-10-CM | POA: Diagnosis present

## 2021-10-31 DIAGNOSIS — Z96642 Presence of left artificial hip joint: Secondary | ICD-10-CM | POA: Diagnosis present

## 2021-10-31 DIAGNOSIS — K219 Gastro-esophageal reflux disease without esophagitis: Secondary | ICD-10-CM | POA: Diagnosis present

## 2021-10-31 DIAGNOSIS — D6959 Other secondary thrombocytopenia: Secondary | ICD-10-CM | POA: Diagnosis not present

## 2021-10-31 DIAGNOSIS — K567 Ileus, unspecified: Secondary | ICD-10-CM | POA: Diagnosis not present

## 2021-10-31 DIAGNOSIS — A419 Sepsis, unspecified organism: Secondary | ICD-10-CM | POA: Diagnosis not present

## 2021-10-31 DIAGNOSIS — Z7982 Long term (current) use of aspirin: Secondary | ICD-10-CM

## 2021-10-31 DIAGNOSIS — E876 Hypokalemia: Secondary | ICD-10-CM | POA: Diagnosis present

## 2021-10-31 DIAGNOSIS — J9600 Acute respiratory failure, unspecified whether with hypoxia or hypercapnia: Secondary | ICD-10-CM

## 2021-10-31 DIAGNOSIS — E860 Dehydration: Secondary | ICD-10-CM | POA: Diagnosis present

## 2021-10-31 DIAGNOSIS — J9811 Atelectasis: Secondary | ICD-10-CM | POA: Diagnosis present

## 2021-10-31 DIAGNOSIS — K920 Hematemesis: Secondary | ICD-10-CM | POA: Diagnosis present

## 2021-10-31 DIAGNOSIS — J449 Chronic obstructive pulmonary disease, unspecified: Secondary | ICD-10-CM | POA: Diagnosis not present

## 2021-10-31 DIAGNOSIS — R4182 Altered mental status, unspecified: Secondary | ICD-10-CM | POA: Diagnosis not present

## 2021-10-31 LAB — TYPE AND SCREEN
ABO/RH(D): A POS
Antibody Screen: NEGATIVE

## 2021-10-31 LAB — CBC
HCT: 39.7 % (ref 39.0–52.0)
Hemoglobin: 13 g/dL (ref 13.0–17.0)
MCH: 32.3 pg (ref 26.0–34.0)
MCHC: 32.7 g/dL (ref 30.0–36.0)
MCV: 98.8 fL (ref 80.0–100.0)
Platelets: 321 10*3/uL (ref 150–400)
RBC: 4.02 MIL/uL — ABNORMAL LOW (ref 4.22–5.81)
RDW: 13.2 % (ref 11.5–15.5)
WBC: 33.8 10*3/uL — ABNORMAL HIGH (ref 4.0–10.5)
nRBC: 0 % (ref 0.0–0.2)

## 2021-10-31 LAB — COMPREHENSIVE METABOLIC PANEL
ALT: 11 U/L (ref 0–44)
AST: 20 U/L (ref 15–41)
Albumin: 2.7 g/dL — ABNORMAL LOW (ref 3.5–5.0)
Alkaline Phosphatase: 98 U/L (ref 38–126)
Anion gap: 11 (ref 5–15)
BUN: 18 mg/dL (ref 8–23)
CO2: 21 mmol/L — ABNORMAL LOW (ref 22–32)
Calcium: 8.1 mg/dL — ABNORMAL LOW (ref 8.9–10.3)
Chloride: 99 mmol/L (ref 98–111)
Creatinine, Ser: 0.96 mg/dL (ref 0.61–1.24)
GFR, Estimated: >60 mL/min (ref >60)
Glucose, Bld: 98 mg/dL (ref 70–99)
Potassium: 3.6 mmol/L (ref 3.5–5.1)
Sodium: 131 mmol/L — ABNORMAL LOW (ref 135–145)
Total Bilirubin: 1.1 mg/dL (ref 0.3–1.2)
Total Protein: 6.8 g/dL (ref 6.5–8.1)

## 2021-10-31 LAB — RESP PANEL BY RT-PCR (FLU A&B, COVID) ARPGX2
Influenza A by PCR: NEGATIVE
Influenza B by PCR: NEGATIVE
SARS Coronavirus 2 by RT PCR: NEGATIVE

## 2021-10-31 MED ORDER — IOHEXOL 350 MG/ML SOLN
100.0000 mL | Freq: Once | INTRAVENOUS | Status: AC | PRN
  Administered 2021-10-31: 75 mL via INTRAVENOUS

## 2021-10-31 NOTE — ED Notes (Signed)
Patient transported to CT 

## 2021-10-31 NOTE — H&P (Signed)
History and Physical    Patient: Duane Richardson VPX:[id number] DOB: Feb 22, 1956 DOA: 11/09/2021 DOS: the patient was seen and examined on 11/22/2021 PCP: Carrolyn Meiers, MD  Patient coming from: Home  Chief Complaint:  Chief Complaint  Patient presents with   Hematemesis   HPI: Duane Richardson is a 66 y.o. male with medical history significant of hypertension, COPD, tobacco use, anxiety and depression who presents to the emergency department due to 3-day onset of coughing up  blood.  He complained of increased cough and mucus production, but denies chest pain, vomiting of blood or blood in stool, patient also denies fever, chills, abdominal pain.  He endorsed tobacco use since he was 66 years old, he used to smoke half pack to 1 pack of cigarettes daily, but has recently reduced this to 2 cigarettes daily.  ED Course:  In the emergency department, he was tachycardic, but other vital signs were within normal range.  Work-up in the ED showed normal CBC except for leukocytosis and normal BMP except for hyponatremia and bicarb of 21.  Albumin 2.7.  Influenza A, B, SARS coronavirus 2 was negative. CT angiography chest with contrast showed: 1. No CT evidence of central pulmonary artery embolus. 2. Right lung and pleural based masses as above with a large malignant right pleural effusion. Significant mass effect with near complete compressive atelectasis of the right middle and right lower lobes. 3. Hepatic, left adrenal, and left upper abdomen metastatic 4. Aortic Atherosclerosis Chest x-ray showed: 1. Masslike density in the RIGHT hilar region measures 4.6 centimeters, suspicious for adenopathy or mass. 2. Multiple pleural based nodular lesions in the RIGHT UPPER lobe. 3. Dense opacity at the RIGHT lung base consisting of pleural effusion and atelectasis, consolidation, or mass. Recommend further evaluation with CT of the chest. Case was discussed with Dr. Lindi Adie (oncologist at [facility]) who  recommends admission to [facility] on medicine service with pulmonary consulting for bronch and to consult with oncology tomorrow Per ED PA. Hospitalist was asked to admit patient for further evaluation and management.  Review of Systems: Review of systems as noted in the HPI. All other systems reviewed and are negative.   Past Medical History:  Diagnosis Date   Allergy    Anxiety    Arthritis    Asthma    Atherosclerosis    Bipolar 1 disorder (HCC)    Cataract    COPD (chronic obstructive pulmonary disease) ([place])    Depression    Elevated PSA    ETOH abuse    quit Oct 2013, then relapsed, as of Dec 2013 now has abstained X 1 month   GERD (gastroesophageal reflux disease)    Hyperlipidemia    Hypertension    Right hip pain 04/13/2019   Past Surgical History:  Procedure Laterality Date   BACK SURGERY     lunbar   COLONOSCOPY  12/21/09   IOE:VOJJ papilla otherwise normal/ pancolonic diverticula/mutiple colonic poylps   COLONOSCOPY WITH ESOPHAGOGASTRODUODENOSCOPY (EGD) N/A 10/20/2012   Procedure: COLONOSCOPY WITH ESOPHAGOGASTRODUODENOSCOPY (EGD);  Surgeon: Daneil Dolin, MD;  Location[facility];  Service: Endoscopy;  Laterality: N/A;  10;15   CYSTOSCOPY N/A 01/31/2013   Procedure: CYSTOSCOPY FLEXIBLE;  Surgeon: Marissa Nestle, MD;  Location: [facility] ORS;  Service: Urology;  Laterality: N/A;  I would like to do this around 1 pm on monday.    HARDWARE REMOVAL Left 06/07/2013   Procedure: HARDWARE REMOVAL;  Surgeon: Mcarthur Rossetti, MD;  Location: [facility]  OR;  Service: Orthopedics;  Laterality: Left;   INTRAMEDULLARY (IM) NAIL INTERTROCHANTERIC Right 04/14/2019   Procedure: INTRAMEDULLARY (IM) NAIL INTERTROCHANTRIC;  Surgeon: Leandrew Koyanagi, MD;  Location: [place];  Service: Orthopedics;  Laterality: Right;   ORIF HIP FRACTURE Left 11/24/2012   Procedure: OPEN REDUCTION INTERNAL FIXATION HIP;  Surgeon: Sanjuana Kava, MD;  Location: [facility];  Service: Orthopedics;  Laterality: Left;    SPINE SURGERY     TOTAL HIP ARTHROPLASTY Left 06/07/2013   Procedure: REMOVE HARDWARE LEFT HIP AND LEFT TOTAL HIP ARTHROPLASTY ANTERIOR APPROACH;  Surgeon: Mcarthur Rossetti, MD;  Location: [place];  Service: Orthopedics;  Laterality: Left;   TRANSURETHRAL RESECTION OF PROSTATE N/A 03/22/2013   Procedure: TRANSURETHRAL RESECTION OF THE PROSTATE (TURP);  Surgeon: Marissa Nestle, MD;  Location: [facility];  Service: Urology;  Laterality: N/A;    Social History:  reports that he has been smoking cigarettes. He has a 15.00 pack-year smoking history. He has never used smokeless tobacco. He reports that he does not currently use alcohol. He reports that he does not currently use drugs after having used the following drugs: Marijuana and Cocaine.   No Known Allergies  Family History  Problem Relation Age of Onset   Mental illness Sister    Colon cancer Neg Hx      Prior to Admission medications   Medication Sig Start Date End Date Taking? Authorizing Provider  acetaminophen (TYLENOL) 325 MG tablet Take 650 mg by mouth every 6 (six) hours as needed.   Yes [provider]  Carboxymethylcellul-Glycerin (CLEAR EYES FOR DRY EYES) 1-0.25 % SOLN Apply 1-2 drops to eye 2 (two) times daily as needed.   Yes [provider]  ibuprofen (ADVIL) 200 MG tablet Take 200 mg by mouth every 6 (six) hours as needed for moderate pain.   Yes [provider]  PROAIR HFA 108 (90 Base) MCG/ACT inhaler INHALE 2 PUFFS INTO THE LUNGS EVERY 4 TO 6 HOURS AS NEEDED. Patient taking differently: Inhale 2 puffs into the lungs every 4 (four) hours as needed for wheezing or shortness of breath. 08/28/16  Yes Susy Frizzle, MD  SYMBICORT 160-4.5 MCG/ACT inhaler Inhale 1 puff into the lungs 2 (two) times daily. 02/08/19  Yes [provider]  ALPRAZolam Duanne Moron) 0.5 MG tablet Take 1 tablet (0.5 mg total) by mouth 3 (three) times daily as needed. Patient not taking: Reported on 11/20/2021 05/05/19    Shelly Coss, MD  amLODipine (NORVASC) 10 MG tablet TAKE ONE TABLET BY MOUTH ONCE DAILY. Patient not taking: Reported on 11/03/2021 08/05/16   Susy Frizzle, MD  amLODipine (NORVASC) 2.5 MG tablet Take 2.5 mg by mouth daily. Patient not taking: Reported on 11/20/2021 08/30/21   [provider]  diclofenac Sodium (VOLTAREN) 1 % GEL Apply 2 g topically 4 (four) times daily. 11/04/20   Tacy Learn, PA-C  metoprolol tartrate (LOPRESSOR) 25 MG tablet Take 25 mg by mouth 2 (two) times daily. Patient not taking: Reported on 11/20/2021 08/30/21   [provider]  sertraline (ZOLOFT) 25 MG tablet Take 25 mg by mouth daily. 07/10/21   [provider]  tiZANidine (ZANAFLEX) 2 MG tablet Take 2 mg by mouth 2 (two) times daily as needed. 05/31/19   [provider]    Physical Exam: BP 94/71 (BP Location: Left Arm)   Pulse (!) 109   Temp 98.1 F (36.7 C) (Oral)   Resp 18   Ht 6\' 2"  (1.88 m)  Wt 59 kg   SpO2 97%   BMI 16.70 kg/m   General: 66 y.o. year-old male ill appearing, but in no acute distress.  Alert and oriented x3. HEENT: NCAT, EOMI, dry mucous membrane Neck: Supple, trachea medial Cardiovascular: Tachycardia.  Regular rate and rhythm with no rubs or gallops.  No thyromegaly or JVD noted.  No lower extremity edema. 2/4 pulses in all 4 extremities. Respiratory: Diffuse wheezing and some rales in lower lobes bilaterally on auscultation.   Abdomen: Soft, nontender, nondistended with normal bowel sounds x4 quadrants. Muskuloskeletal: No cyanosis, clubbing or edema noted bilaterally Neuro: CN II-XII intact, strength 5/5 x 4, sensation, reflexes intact Skin: No ulcerative lesions noted or rashes Psychiatry: Mood is appropriate for condition and setting          Labs on Admission:  Basic Metabolic Panel: Recent Labs  Lab 11/20/2021 1700  NA 131*  K 3.6  CL 99  CO2 21*  GLUCOSE 98  BUN 18  CREATININE 0.96  CALCIUM 8.1*   Liver Function  Tests: Recent Labs  Lab 10/30/2021 1700  AST 20  ALT 11  ALKPHOS 98  BILITOT 1.1  PROT 6.8  ALBUMIN 2.7*   No results for input(s): "LIPASE", "AMYLASE" in the last 168 hours. No results for input(s): "AMMONIA" in the last 168 hours. CBC: Recent Labs  Lab 10/26/2021 2012  WBC 33.8*  HGB 13.0  HCT 39.7  MCV 98.8  PLT 321   Cardiac Enzymes: No results for input(s): "CKTOTAL", "CKMB", "CKMBINDEX", "TROPONINI" in the last 168 hours.  BNP (last 3 results) No results for input(s): "BNP" in the last 8760 hours.  ProBNP (last 3 results) No results for input(s): "PROBNP" in the last 8760 hours.  CBG: No results for input(s): "GLUCAP" in the last 168 hours.  Radiological Exams on Admission: CT Angio Chest PE W and/or Wo Contrast  Result Date: 11/21/2021 CLINICAL DATA:  Concern for pulmonary edema. EXAM: CT ANGIOGRAPHY CHEST WITH CONTRAST TECHNIQUE: Multidetector CT imaging of the chest was performed using the standard protocol during bolus administration of intravenous contrast. Multiplanar CT image reconstructions and MIPs were obtained to evaluate the vascular anatomy. RADIATION DOSE REDUCTION: This exam was performed according to the departmental dose-optimization program which includes automated exposure control, adjustment of the mA and/or kV according to patient size and/or use of iterative reconstruction technique. CONTRAST:  75mL OMNIPAQUE IOHEXOL 350 MG/ML SOLN COMPARISON:  Chest CT dated 04/22/2019 and radiograph dated 11/10/2021. FINDINGS: Evaluation of this exam is limited due to respiratory motion artifact. Cardiovascular: There is no cardiomegaly or pericardial effusion. There is coronary vascular calcification. There is mild atherosclerotic calcification of the thoracic aorta. No aneurysmal dilatation. Evaluation of the pulmonary arteries is limited due to respiratory motion. No obvious large central pulmonary artery embolus identified. Mediastinum/Nodes: Posterior mediastinal  adenopathy measures 2 cm. Right paratracheal adenopathy measures 1.8 cm in short axis. Evaluation of the right hilar lymph node is limited due to consolidative changes of the right lung. The esophagus is grossly unremarkable. Lungs/Pleura: Large right pleural effusion with near complete consolidation of the right lower and right middle lobes. There is a 5.3 x 3.8 cm right upper lobe mass as well as several additional smaller pleural based nodules in the right upper lobe. There is a 6.2 x 2.5 cm pleural based mass in the posterior right lower lobe. There is background of mild centrilobular emphysema. No pneumothorax. There is mass effect and compression of the right mainstem bronchus and bronchus intermedius with high-grade  compression of the right middle and right lower lobe bronchi. Upper Abdomen: A 2 cm left adrenal nodule new since the prior CT and suspicious for metastasis. Several faint hepatic hypodense lesions most consistent with metastatic disease. A 1.5 cm nodular density in the left upper abdomen (313/6), new since the prior CT most consistent with metastatic disease. Musculoskeletal: Osteopenia with degenerative changes of the spine. Old healed sternal and bilateral rib fractures. No acute osseous pathology. Review of the MIP images confirms the above findings. IMPRESSION: 1. No CT evidence of central pulmonary artery embolus. 2. Right lung and pleural based masses as above with a large malignant right pleural effusion. Significant mass effect with near complete compressive atelectasis of the right middle and right lower lobes. 3. Hepatic, left adrenal, and left upper abdomen metastatic 4. Aortic Atherosclerosis (ICD10-I70.0) and Emphysema (ICD10-J43.9). Electronically Signed   By: Anner Crete M.D.   On: 11/08/2021 19:26   DG Chest Portable 1 View  Result Date: 11/18/2021 CLINICAL DATA:  Hemoptysis. Coughing blood for 3 days. History of asthma and COPD. EXAM: PORTABLE CHEST 1 VIEW COMPARISON:   11/04/2020 FINDINGS: The heart is partially obscured by dense opacity at the RIGHT lung base consisting of pleural effusion and atelectasis, infiltrate, or mass. A masslike density measures 4.6 centimeters in the RIGHT hilar region. Suspect pleural based nodules in the RIGHT UPPER lobe. A 1.0 centimeter nodule is also identified in the RIGHT UPPER lobe. Irregular density in the RIGHT hilar region is suspicious for adenopathy. Mildly prominent reticular changes are identified in the aerated LEFT lung. There are remote LEFT rib fractures. IMPRESSION: 1. Masslike density in the RIGHT hilar region measures 4.6 centimeters, suspicious for adenopathy or mass. 2. Multiple pleural based nodular lesions in the RIGHT UPPER lobe. 3. Dense opacity at the RIGHT lung base consisting of pleural effusion and atelectasis, consolidation, or mass. Recommend further evaluation with CT of the chest. Electronically Signed   By: Nolon Nations M.D.   On: 11/02/2021 17:31    EKG: I independently viewed the EKG done and my findings are as followed: EKG was not done in the ED  Assessment/Plan Present on Admission:  Depression  TOBACCO ABUSE  Principal Problem:   Pulmonary nodule Active Problems:   TOBACCO ABUSE   Depression   Essential hypertension   Mass of right lung   Pleural effusion   Multiple lesions of metastatic malignancy (HCC)   Hyponatremia   Leukocytosis   Hypoalbuminemia due to protein-calorie malnutrition (HCC)   Failure to thrive in adult  Right lung mass Chest x-ray showed Masslike density in the RIGHT hilar region measures 4.6 centimeters, suspicious for adenopathy or mass. CT angiography of chest showed  Right lung and pleural based masses  Patient presents with 3-day onset of hemoptysis Oncologist at [facility] (Dr. Lindi Adie) was consulted and recommended admitting patient to [facility] and to plan for pulmonology consult for bronchoscopy, as well as oncology consult tomorrow per [facility] ED  PA Consult placed for pulmonology and oncology  Incidental finding of pulmonary nodules Chest x-ray showed  multiple pleural based nodular lesions in the RIGHT UPPER lobe. Continue management as described above  Presumed malignant right pleural effusion Chest x-ray showed dense opacity at the RIGHT lung base consisting of pleural effusion and atelectasis, consolidation, or mass.  Thoracentesis will be done in the morning Continue incentive spirometry due to atelectasis  Metastatic lesions CT angiography of chest showed Hepatic, left adrenal, and left upper abdomen metastatic lesions Oncology was consulted  by [facility] ED PA.  Please reach out to oncology team when patient [facility] to [facility]  Leukocytosis possibly due to multifactorial WBC 33.3, this may be due to stress demargination, no obvious sign of acute infectious process noted at this time Continue to monitor WBC with morning labs  Hyponatremia Dehydration Na 131, continue IV hydration  Hypoalbuminemia possibly secondary to moderate protein calorie malnutrition Failure to thrive in adult BMI 16.70) Protein supplement to be provided  Essential hypertension BP meds will be held at this time due to soft BP  COPD (not in acute exacerbation) Continue albuterol and Dulera Continue DuoNebs as needed  Depression Continue Zoloft  Tobacco use Patient was counseled on tobacco abuse cessation  DVT prophylaxis: SCDs  Code Status: Full code  Consults: Pulmonology, oncology, IR  Family Communication: None at bedside  Severity of Illness: The appropriate patient status for this patient is INPATIENT. Inpatient status is judged to be reasonable and necessary in order to provide the required intensity of service to ensure the patient's safety. The patient's presenting symptoms, physical exam findings, and initial radiographic and laboratory data in the context of their chronic comorbidities is felt to place them at high risk for  further clinical deterioration. Furthermore, it is not anticipated that the patient will be medically stable for discharge from the hospital within 2 midnights of admission.   * I certify that at the point of admission it is my clinical judgment that the patient will require inpatient hospital care spanning beyond 2 midnights from the point of admission due to high intensity of service, high risk for further deterioration and high frequency of surveillance required.*  Author: Bernadette Hoit, DO 11/04/2021 12:15 AM  For on call review [email or web address].

## 2021-10-31 NOTE — ED Provider Notes (Signed)
[facility] EMERGENCY DEPARTMENT Provider Note   CSN: [id number] Arrival date & time: 10/25/2021  1559     History  Chief Complaint  Patient presents with   Hematemesis    Duane Richardson  is a 66 y.o. male.  66 year old male with past medical history significant for COPD presents today for evaluation of 3-day duration of hemoptysis.  He denies fever, chills.  Reports increase in his cough and mucus production.  Denies wheezing.  Denies hematemesis, or melanotic stools.  Denies bright red blood per rectum.  Denies abdominal pain, fever.  He presents with his caseworker who is at bedside.  He states that home with family around.  The history is provided by the patient. No language interpreter was used.       Home Medications Prior to Admission medications   Medication Sig Start Date End Date Taking? Authorizing Provider  acetaminophen (TYLENOL) 325 MG tablet Take 650 mg by mouth every 6 (six) hours as needed.    [provider]  ALPRAZolam Duanne Moron) 0.5 MG tablet Take 1 tablet (0.5 mg total) by mouth 3 (three) times daily as needed. 05/05/19   Shelly Coss, MD  amLODipine (NORVASC) 10 MG tablet TAKE ONE TABLET BY MOUTH ONCE DAILY. Patient taking differently: Take 10 mg by mouth daily. 08/05/16   Susy Frizzle, MD  aspirin 325 MG EC tablet Take 1 tablet (325 mg total) by mouth daily. 05/05/19   Shelly Coss, MD  Carboxymethylcellul-Glycerin (CLEAR EYES FOR DRY EYES) 1-0.25 % SOLN Apply 1-2 drops to eye 2 (two) times daily as needed.    [provider]  diclofenac Sodium (VOLTAREN) 1 % GEL Apply 2 g topically 4 (four) times daily. 11/04/20   Tacy Learn, PA-C  docusate sodium (COLACE) 100 MG capsule Take 1 capsule (100 mg total) by mouth 2 (two) times daily. 05/05/19   Shelly Coss, MD  escitalopram (LEXAPRO) 10 MG tablet Take 10 mg by mouth daily. 12/28/19   [provider]  folic acid (FOLVITE) 1 MG tablet Take 1 tablet (1 mg total) by mouth daily.  05/05/19   Shelly Coss, MD  hydrOXYzine (VISTARIL) 25 MG capsule Take 25 mg by mouth 3 (three) times daily. 07/02/20   [provider]  ketoconazole (NIZORAL) 2 % cream Apply 1 application topically 2 (two) times daily. 12/29/19   McDonald, Stephan Minister, DPM  loratadine (CLARITIN) 10 MG tablet Take 10 mg by mouth daily. 02/08/19   [provider]  polyethylene glycol (MIRALAX / GLYCOLAX) 17 g packet Take 17 g by mouth 2 (two) times daily. 05/05/19   Shelly Coss, MD  PROAIR HFA 108 (90 Base) MCG/ACT inhaler INHALE 2 PUFFS INTO THE LUNGS EVERY 4 TO 6 HOURS AS NEEDED. Patient taking differently: Inhale 2 puffs into the lungs every 4 (four) hours as needed for wheezing or shortness of breath. 08/28/16   Susy Frizzle, MD  SYMBICORT 160-4.5 MCG/ACT inhaler Inhale 1 puff into the lungs 2 (two) times daily. 02/08/19   [provider]  thiamine 100 MG tablet Take 1 tablet (100 mg total) by mouth daily. 05/05/19   Shelly Coss, MD  tiZANidine (ZANAFLEX) 2 MG tablet Take 2 mg by mouth 2 (two) times daily as needed. 05/31/19   [provider]  vitamin B-12 1000 MCG tablet Take 1 tablet (1,000 mcg total) by mouth daily. 05/05/19   Shelly Coss, MD      Allergies    Patient has no known allergies.  Review of Systems   Review of Systems  Constitutional:  Negative for chills and fever.  HENT:  Negative for congestion.   Respiratory:  Positive for cough. Negative for shortness of breath.   Cardiovascular:  Negative for chest pain, palpitations and leg swelling.  Gastrointestinal:  Negative for blood in stool, nausea and vomiting.  Neurological:  Negative for weakness and light-headedness.  All other systems reviewed and are negative.   Physical Exam Updated Vital Signs BP 101/71 (BP Location: Right Arm)   Pulse (!) 117   Temp 97.8 F (36.6 C) (Oral)   Resp 17   Ht 6\' 2"  (1.88 m)   Wt 59 kg   SpO2 (!) 79%   BMI 16.70 kg/m  Physical Exam Vitals and nursing  note reviewed.  Constitutional:      General: He is not in acute distress.    Appearance: Normal appearance. He is ill-appearing (Chronically ill-appearing).  HENT:     Head: Normocephalic and atraumatic.     Nose: Nose normal.  Eyes:     General: No scleral icterus.    Extraocular Movements: Extraocular movements intact.     Conjunctiva/sclera: Conjunctivae normal.  Cardiovascular:     Rate and Rhythm: Regular rhythm. Tachycardia present.     Pulses: Normal pulses.  Pulmonary:     Effort: Pulmonary effort is normal. No respiratory distress.     Breath sounds: No wheezing.     Comments: Coarse lung sounds throughout. Abdominal:     General: There is no distension.     Palpations: Abdomen is soft.     Tenderness: There is no abdominal tenderness. There is no guarding.  Musculoskeletal:        General: Normal range of motion.     Cervical back: Normal range of motion.     Right lower leg: No edema.     Left lower leg: No edema.  Skin:    General: Skin is warm and dry.  Neurological:     General: No focal deficit present.     Mental Status: He is alert. Mental status is at baseline.     ED Results / Procedures / Treatments   Labs (all labs ordered are listed, but only abnormal results are displayed) Labs Reviewed  COMPREHENSIVE METABOLIC PANEL  CBC  POC OCCULT BLOOD, ED  TYPE AND SCREEN    EKG None  Radiology No results found.  Procedures Procedures    Medications Ordered in ED Medications - No data to display  ED Course/ Medical Decision Making/ A&P Clinical Course as of 11/01/2021 2126  Thu Oct 31, 2021  2107 CBC shows leukocytosis of 34,000.  Differential pending.  CMP shows sodium of 131 otherwise without acute findings.  Chest x-ray shows lung nodules concerning for malignancy.  CT angio chest with evidence of malignant right lung mass with associated malignant pleural effusion with metastasis to the upper abdomen.  Case discussed with Dr. Lindi Adie  (oncologist) who recommends admission to [facility] on medicine service with pulmonary consulting for bronch.  States that oncology can be consulted tomorrow. [AA]    Clinical Course User Index [AA] Evlyn Courier, PA-C                           Medical Decision Making Amount and/or Complexity of Data Reviewed Labs: ordered. Radiology: ordered.  Risk Prescription drug management.   66 year old male presents today for evaluation of hemoptysis.  Ongoing for about 3 days.  History of COPD.  Significant smoking history.  Denies fever, chills.  Initially noted to have O2 sats of 79%.  On inquiring from nurse this was an accurate.  On my evaluation patient maintained sats of mid to upper 90s on room air.  Denies chest pain.  Will evaluate with blood work, CTA chest to rule out malignancy, or PE.  CTA chest concerning for malignant mass.  Findings discussed with patient who voices understanding.  Work-up as above.  Will discuss with hospitalist. Discussed with hospitalist will evaluate patient for admission.  Final Clinical Impression(s) / ED Diagnoses Final diagnoses:  Lung mass  Malignant pleural effusion    Rx / DC Orders ED Discharge Orders     None         Evlyn Courier, PA-C 11/18/2021 2126    Elgie Congo, MD 11/20/2021 [phone]

## 2021-10-31 NOTE — ED Triage Notes (Signed)
Pt presents with coughing up blood x 3 days.

## 2021-11-01 ENCOUNTER — Inpatient Hospital Stay (HOSPITAL_COMMUNITY): Payer: Medicare Other | Admitting: Anesthesiology

## 2021-11-01 ENCOUNTER — Inpatient Hospital Stay (HOSPITAL_COMMUNITY): Payer: Medicare Other

## 2021-11-01 ENCOUNTER — Encounter (HOSPITAL_COMMUNITY): Admission: EM | Disposition: E | Payer: Self-pay | Source: Home / Self Care | Attending: Pulmonary Disease

## 2021-11-01 DIAGNOSIS — R918 Other nonspecific abnormal finding of lung field: Secondary | ICD-10-CM

## 2021-11-01 DIAGNOSIS — F418 Other specified anxiety disorders: Secondary | ICD-10-CM | POA: Diagnosis not present

## 2021-11-01 DIAGNOSIS — I10 Essential (primary) hypertension: Secondary | ICD-10-CM

## 2021-11-01 DIAGNOSIS — E46 Unspecified protein-calorie malnutrition: Secondary | ICD-10-CM

## 2021-11-01 DIAGNOSIS — J90 Pleural effusion, not elsewhere classified: Secondary | ICD-10-CM

## 2021-11-01 DIAGNOSIS — C799 Secondary malignant neoplasm of unspecified site: Secondary | ICD-10-CM

## 2021-11-01 DIAGNOSIS — F17210 Nicotine dependence, cigarettes, uncomplicated: Secondary | ICD-10-CM

## 2021-11-01 DIAGNOSIS — R627 Adult failure to thrive: Secondary | ICD-10-CM

## 2021-11-01 DIAGNOSIS — E871 Hypo-osmolality and hyponatremia: Secondary | ICD-10-CM

## 2021-11-01 DIAGNOSIS — R911 Solitary pulmonary nodule: Secondary | ICD-10-CM | POA: Diagnosis not present

## 2021-11-01 DIAGNOSIS — J910 Malignant pleural effusion: Secondary | ICD-10-CM | POA: Insufficient documentation

## 2021-11-01 DIAGNOSIS — J449 Chronic obstructive pulmonary disease, unspecified: Secondary | ICD-10-CM | POA: Diagnosis not present

## 2021-11-01 DIAGNOSIS — E8809 Other disorders of plasma-protein metabolism, not elsewhere classified: Secondary | ICD-10-CM

## 2021-11-01 DIAGNOSIS — D72829 Elevated white blood cell count, unspecified: Secondary | ICD-10-CM

## 2021-11-01 DIAGNOSIS — C3491 Malignant neoplasm of unspecified part of right bronchus or lung: Secondary | ICD-10-CM | POA: Insufficient documentation

## 2021-11-01 HISTORY — PX: BRONCHIAL NEEDLE ASPIRATION BIOPSY: SHX5106

## 2021-11-01 HISTORY — PX: VIDEO BRONCHOSCOPY WITH ENDOBRONCHIAL ULTRASOUND: SHX6177

## 2021-11-01 HISTORY — PX: IR THORACENTESIS ASP PLEURAL SPACE W/IMG GUIDE: IMG5380

## 2021-11-01 LAB — HIV ANTIBODY (ROUTINE TESTING W REFLEX): HIV Screen 4th Generation wRfx: NONREACTIVE

## 2021-11-01 LAB — CBC
HCT: 36.5 % — ABNORMAL LOW (ref 39.0–52.0)
Hemoglobin: 12.7 g/dL — ABNORMAL LOW (ref 13.0–17.0)
MCH: 32 pg (ref 26.0–34.0)
MCHC: 34.8 g/dL (ref 30.0–36.0)
MCV: 91.9 fL (ref 80.0–100.0)
Platelets: 347 10*3/uL (ref 150–400)
RBC: 3.97 MIL/uL — ABNORMAL LOW (ref 4.22–5.81)
RDW: 13.1 % (ref 11.5–15.5)
WBC: 34.2 10*3/uL — ABNORMAL HIGH (ref 4.0–10.5)
nRBC: 0 % (ref 0.0–0.2)

## 2021-11-01 LAB — COMPREHENSIVE METABOLIC PANEL
ALT: 7 U/L (ref 0–44)
AST: 17 U/L (ref 15–41)
Albumin: 2.3 g/dL — ABNORMAL LOW (ref 3.5–5.0)
Alkaline Phosphatase: 75 U/L (ref 38–126)
Anion gap: 10 (ref 5–15)
BUN: 16 mg/dL (ref 8–23)
CO2: 20 mmol/L — ABNORMAL LOW (ref 22–32)
Calcium: 8 mg/dL — ABNORMAL LOW (ref 8.9–10.3)
Chloride: 102 mmol/L (ref 98–111)
Creatinine, Ser: 0.92 mg/dL (ref 0.61–1.24)
GFR, Estimated: >60 mL/min (ref >60)
Glucose, Bld: 113 mg/dL — ABNORMAL HIGH (ref 70–99)
Potassium: 3.7 mmol/L (ref 3.5–5.1)
Sodium: 132 mmol/L — ABNORMAL LOW (ref 135–145)
Total Bilirubin: 1.1 mg/dL (ref 0.3–1.2)
Total Protein: 5.8 g/dL — ABNORMAL LOW (ref 6.5–8.1)

## 2021-11-01 LAB — BODY FLUID CELL COUNT WITH DIFFERENTIAL
Eos, Fluid: 10 %
Lymphs, Fluid: 24 %
Monocyte-Macrophage-Serous Fluid: 29 % — ABNORMAL LOW (ref 50–90)
Neutrophil Count, Fluid: 34 % — ABNORMAL HIGH (ref 0–25)
Total Nucleated Cell Count, Fluid: UNDETERMINED cu mm (ref 0–1000)

## 2021-11-01 LAB — GRAM STAIN

## 2021-11-01 LAB — LACTATE DEHYDROGENASE, PLEURAL OR PERITONEAL FLUID: LD, Fluid: 275 U/L — ABNORMAL HIGH (ref 3–23)

## 2021-11-01 LAB — GLUCOSE, PLEURAL OR PERITONEAL FLUID: Glucose, Fluid: 77 mg/dL

## 2021-11-01 LAB — PROTEIN, PLEURAL OR PERITONEAL FLUID: Total protein, fluid: 3.4 g/dL

## 2021-11-01 LAB — MAGNESIUM: Magnesium: 1.6 mg/dL — ABNORMAL LOW (ref 1.7–2.4)

## 2021-11-01 LAB — PHOSPHORUS: Phosphorus: 3.5 mg/dL (ref 2.5–4.6)

## 2021-11-01 SURGERY — BRONCHOSCOPY, WITH EBUS
Anesthesia: General

## 2021-11-01 MED ORDER — MOMETASONE FURO-FORMOTEROL FUM 200-5 MCG/ACT IN AERO
2.0000 | INHALATION_SPRAY | Freq: Two times a day (BID) | RESPIRATORY_TRACT | Status: DC
  Administered 2021-11-02 (×2): 2 via RESPIRATORY_TRACT
  Filled 2021-11-01 (×2): qty 8.8

## 2021-11-01 MED ORDER — DEXAMETHASONE SODIUM PHOSPHATE 10 MG/ML IJ SOLN
INTRAMUSCULAR | Status: DC | PRN
  Administered 2021-11-01: 5 mg via INTRAVENOUS

## 2021-11-01 MED ORDER — IPRATROPIUM-ALBUTEROL 0.5-2.5 (3) MG/3ML IN SOLN: 3.0000 mL | RESPIRATORY_TRACT | Status: DC | PRN

## 2021-11-01 MED ORDER — IOHEXOL 300 MG/ML  SOLN
100.0000 mL | Freq: Once | INTRAMUSCULAR | Status: AC | PRN
Start: 2021-11-01 — End: 2021-11-01
  Administered 2021-11-01: 100 mL via INTRAVENOUS

## 2021-11-01 MED ORDER — SUGAMMADEX SODIUM 200 MG/2ML IV SOLN
INTRAVENOUS | Status: DC | PRN
  Administered 2021-11-01: 200 mg via INTRAVENOUS

## 2021-11-01 MED ORDER — ALBUTEROL SULFATE (2.5 MG/3ML) 0.083% IN NEBU: 2.5000 mg | INHALATION_SOLUTION | RESPIRATORY_TRACT | Status: DC | PRN

## 2021-11-01 MED ORDER — LIDOCAINE HCL 1 % IJ SOLN
INTRAMUSCULAR | Status: AC
  Filled 2021-11-01: qty 20

## 2021-11-01 MED ORDER — PHENYLEPHRINE 80 MCG/ML (10ML) SYRINGE FOR IV PUSH (FOR BLOOD PRESSURE SUPPORT)
PREFILLED_SYRINGE | INTRAVENOUS | Status: DC | PRN
  Administered 2021-11-01 (×2): 160 ug via INTRAVENOUS

## 2021-11-01 MED ORDER — ACETAMINOPHEN 325 MG PO TABS: 650.0000 mg | ORAL_TABLET | Freq: Four times a day (QID) | ORAL | Status: DC | PRN

## 2021-11-01 MED ORDER — SODIUM CHLORIDE 0.9 % IV SOLN: INTRAVENOUS | Status: AC

## 2021-11-01 MED ORDER — LACTATED RINGERS IV SOLN
INTRAVENOUS | Status: AC | PRN
  Administered 2021-11-01: 20 mL/h via INTRAVENOUS

## 2021-11-01 MED ORDER — LACTATED RINGERS IV SOLN: INTRAVENOUS | Status: DC | PRN

## 2021-11-01 MED ORDER — ENSURE ENLIVE PO LIQD
237.0000 mL | Freq: Two times a day (BID) | ORAL | Status: DC
  Administered 2021-11-01 – 2021-11-02 (×4): 237 mL via ORAL
  Filled 2021-11-01 (×2): qty 237

## 2021-11-01 MED ORDER — ONDANSETRON HCL 4 MG/2ML IJ SOLN
INTRAMUSCULAR | Status: DC | PRN
  Administered 2021-11-01: 4 mg via INTRAVENOUS

## 2021-11-01 MED ORDER — ROCURONIUM BROMIDE 10 MG/ML (PF) SYRINGE
PREFILLED_SYRINGE | INTRAVENOUS | Status: DC | PRN
  Administered 2021-11-01: 40 mg via INTRAVENOUS

## 2021-11-01 MED ORDER — FENTANYL CITRATE (PF) 250 MCG/5ML IJ SOLN
INTRAMUSCULAR | Status: DC | PRN
  Administered 2021-11-01: 50 ug via INTRAVENOUS

## 2021-11-01 MED ORDER — LIDOCAINE HCL 1 % IJ SOLN
INTRAMUSCULAR | Status: DC | PRN
  Administered 2021-11-01: 10 mL via INTRADERMAL

## 2021-11-01 MED ORDER — SERTRALINE HCL 25 MG PO TABS
25.0000 mg | ORAL_TABLET | Freq: Every day | ORAL | Status: DC
  Administered 2021-11-01 – 2021-11-02 (×2): 25 mg via ORAL
  Filled 2021-11-01 (×2): qty 1

## 2021-11-01 MED ORDER — PROPOFOL 10 MG/ML IV BOLUS
INTRAVENOUS | Status: DC | PRN
  Administered 2021-11-01: 30 mg via INTRAVENOUS
  Administered 2021-11-01: 110 mg via INTRAVENOUS

## 2021-11-01 MED ORDER — ADULT MULTIVITAMIN W/MINERALS CH
1.0000 | ORAL_TABLET | Freq: Every day | ORAL | Status: DC
  Administered 2021-11-01 – 2021-11-02 (×2): 1 via ORAL
  Filled 2021-11-01 (×3): qty 1

## 2021-11-01 MED ORDER — ACETAMINOPHEN 650 MG RE SUPP: 650.0000 mg | Freq: Four times a day (QID) | RECTAL | Status: DC | PRN

## 2021-11-01 MED ORDER — FENTANYL CITRATE (PF) 100 MCG/2ML IJ SOLN: 25.0000 ug | INTRAMUSCULAR | Status: DC | PRN

## 2021-11-01 MED ORDER — LIDOCAINE 2% (20 MG/ML) 5 ML SYRINGE
INTRAMUSCULAR | Status: DC | PRN
  Administered 2021-11-01: 60 mg via INTRAVENOUS

## 2021-11-01 MED ORDER — IOHEXOL 9 MG/ML PO SOLN: 500.0000 mL | ORAL | Status: AC

## 2021-11-01 SURGICAL SUPPLY — 35 items
ADAPTER VALVE BIOPSY EBUS (MISCELLANEOUS) IMPLANT
ADPTR VALVE BIOPSY EBUS (MISCELLANEOUS)
BRUSH CYTOL CELLEBRITY 1.5X140 (MISCELLANEOUS) IMPLANT
CANISTER SUCT 3000ML PPV (MISCELLANEOUS) ×2 IMPLANT
CONT SPEC 4OZ CLIKSEAL STRL BL (MISCELLANEOUS) ×2 IMPLANT
COVER BACK TABLE 60X90IN (DRAPES) ×2 IMPLANT
FORCEPS BIOP RJ4 1.8 (CUTTING FORCEPS) IMPLANT
GAUZE SPONGE 4X4 12PLY STRL (GAUZE/BANDAGES/DRESSINGS) ×2 IMPLANT
GLOVE BIO SURGEON STRL SZ7.5 (GLOVE) ×2 IMPLANT
GOWN STRL REUS W/ TWL LRG LVL3 (GOWN DISPOSABLE) ×2 IMPLANT
GOWN STRL REUS W/ TWL XL LVL3 (GOWN DISPOSABLE) ×2 IMPLANT
GOWN STRL REUS W/TWL LRG LVL3 (GOWN DISPOSABLE) ×2
GOWN STRL REUS W/TWL XL LVL3 (GOWN DISPOSABLE) ×2
KIT CLEAN ENDO COMPLIANCE (KITS) ×4 IMPLANT
KIT TURNOVER KIT B (KITS) ×2 IMPLANT
MARKER SKIN DUAL TIP RULER LAB (MISCELLANEOUS) ×2 IMPLANT
NDL ASPIRATION VIZISHOT 19G (NEEDLE) IMPLANT
NDL ASPIRATION VIZISHOT 21G (NEEDLE) IMPLANT
NEEDLE ASPIRATION VIZISHOT 19G (NEEDLE) IMPLANT
NEEDLE ASPIRATION VIZISHOT 21G (NEEDLE) IMPLANT
NS IRRIG 1000ML POUR BTL (IV SOLUTION) ×2 IMPLANT
OIL SILICONE PENTAX (PARTS (SERVICE/REPAIRS)) ×2 IMPLANT
PAD ARMBOARD 7.5X6 YLW CONV (MISCELLANEOUS) ×4 IMPLANT
SYR 20ML ECCENTRIC (SYRINGE) ×4 IMPLANT
SYR 20ML LL LF (SYRINGE) ×4 IMPLANT
SYR 50ML SLIP (SYRINGE) IMPLANT
SYR 5ML LUER SLIP (SYRINGE) ×2 IMPLANT
TOWEL GREEN STERILE FF (TOWEL DISPOSABLE) ×2 IMPLANT
TRAP SPECIMEN MUCOUS 40CC (MISCELLANEOUS) IMPLANT
TUBE CONNECTING 20X1/4 (TUBING) ×4 IMPLANT
UNDERPAD 30X30 (UNDERPADS AND DIAPERS) ×2 IMPLANT
VALVE BIOPSY  SINGLE USE (MISCELLANEOUS) ×2
VALVE BIOPSY SINGLE USE (MISCELLANEOUS) ×2 IMPLANT
VALVE SUCTION BRONCHIO DISP (MISCELLANEOUS) ×2 IMPLANT
WATER STERILE IRR 1000ML POUR (IV SOLUTION) ×2 IMPLANT

## 2021-11-01 NOTE — H&P (Signed)
NAME:  DRAKE WUERTZ, MRN:  [id number], DOB:  08-Mar-1955, LOS: 1 ADMISSION DATE:  11/17/2021, CONSULTATION DATE:  11/18/2021 REFERRING MD:  Pietro Cassis, CHIEF COMPLAINT:  Hemoptysis   History of Present Illness:  66 year old man with extensive smoking history and COPD presenting with cough, hemoptysis, weight loss an fatigue.  Imaging markedly abnormal and PCCM consulted.  Weight loss has been over past year.  Worsening DOE over same time period.  Hemoptysis over past 3 days.  Also worsening falls, ambulatory failure x 1 month.  Pertinent  Medical History  COPD DJD post THA HTN HLD  Significant Hospital Events: Including procedures, antibiotic start and stop dates in addition to other pertinent events   9/7 admit 9/8 thoracentesis  Interim History / Subjective:  No events, breathing improved after thoracentesis.   Objective   Blood pressure 123/89, pulse 99, temperature 98.5 F (36.9 C), temperature source Temporal, resp. rate 20, height 6\' 2"  (1.88 m), weight 59 kg, SpO2 96 %.       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 Severe protein calorie malnutrition COPD not in flare Hx of heavy smoking Leukamoid reaction  - Bronch EBUS today - f/u pleural and mediastinal mass cytology - MRI brain w/wo contrast - Further workup per oncology team, will be available PRN, call if pleurX needed  Best Practice (right click and "Reselect  all SmartList Selections" daily)   Per primary  Labs   CBC: Recent Labs  Lab 11/23/2021 2012 11/17/2021 0329  WBC 33.8* 34.2*  HGB 13.0 12.7*  HCT 39.7 36.5*  MCV 98.8 91.9  PLT 321 008    Basic Metabolic Panel: Recent Labs  Lab 11/05/2021 1700 11/23/2021 0329  NA 131* 132*  K 3.6 3.7  CL 99 102  CO2 21* 20*  GLUCOSE 98 113*  BUN 18 16  CREATININE 0.96 0.92  CALCIUM 8.1* 8.0*  MG  --  1.6*  PHOS  --  3.5   GFR: Estimated Creatinine Clearance: 64.1 mL/min (by C-G formula based on SCr of 0.92 mg/dL). Recent Labs  Lab 10/27/2021 2012 11/20/2021 0329  WBC 33.8* 34.2*    Liver Function Tests: Recent Labs  Lab 11/19/2021 1700 11/22/2021 0329  AST 20 17  ALT 11 7  ALKPHOS 98 75  BILITOT 1.1 1.1  PROT 6.8 5.8*  ALBUMIN 2.7* 2.3*   No results for input(s): "LIPASE", "AMYLASE" in the last 168 hours. No results for input(s): "AMMONIA" in the last 168 hours.  ABG    Component Value Date/Time   PHART 7.342 (L) 06/07/2013 1705   PCO2ART 44.4 06/07/2013 1705   PO2ART 66.0 (L) 06/07/2013 1705   HCO3 24.3 (H) 06/07/2013 1705   TCO2 26 06/07/2013 1705   ACIDBASEDEF 2.0 06/07/2013 1705   O2SAT 92.0 06/07/2013 1705     Coagulation Profile: No results for input(s): "INR", "PROTIME" in the last 168 hours.  Cardiac Enzymes: No results for input(s): "CKTOTAL", "CKMB", "CKMBINDEX", "TROPONINI" in  the last 168 hours.  HbA1C: No results found for: "HGBA1C"  CBG: No results for input(s): "GLUCAP" in the last 168 hours.  Review of Systems:    Positive Symptoms in bold:  Constitutional fevers, chills, weight loss, fatigue, anorexia, malaise  Eyes decreased vision, double vision, eye irritation  Ears, Nose, Mouth, Throat sore throat, trouble swallowing, sinus congestion  Cardiovascular chest pain, paroxysmal nocturnal dyspnea, lower ext edema, palpitations   Respiratory SOB, cough, DOE, hemoptysis, wheezing  Gastrointestinal nausea, vomiting, diarrhea  Genitourinary  burning with urination, trouble urinating  Musculoskeletal joint aches, joint swelling, back pain  Integumentary  rashes, skin lesions  Neurological focal weakness, focal numbness, trouble speaking, headaches  Psychiatric depression, anxiety, confusion  Endocrine polyuria, polydipsia, cold intolerance, heat intolerance  Hematologic abnormal bruising, abnormal bleeding, unexplained nose bleeds  Allergic/Immunologic recurrent infections, hives, swollen lymph nodes     Past Medical History:  He,  has a past medical history of Allergy, Anxiety, Arthritis, Asthma, Atherosclerosis, Bipolar 1 disorder ([place]), Cataract, COPD (chronic obstructive pulmonary disease) ([place]), Depression, Elevated PSA, ETOH abuse, GERD (gastroesophageal reflux disease), Hyperlipidemia, Hypertension, and Right hip pain (04/13/2019).   Surgical History:   Past Surgical History:  Procedure Laterality Date   BACK SURGERY     lunbar   COLONOSCOPY  12/21/09   QAS:TMHD papilla otherwise normal/ pancolonic diverticula/mutiple colonic poylps   COLONOSCOPY WITH ESOPHAGOGASTRODUODENOSCOPY (EGD) N/A 10/20/2012   Procedure: COLONOSCOPY WITH ESOPHAGOGASTRODUODENOSCOPY (EGD);  Surgeon: Daneil Dolin, MD;  Location: [facility];  Service: Endoscopy;  Laterality: N/A;  10;15   CYSTOSCOPY N/A 01/31/2013   Procedure: CYSTOSCOPY FLEXIBLE;  Surgeon: Marissa Nestle, MD;  Location: [facility];  Service: Urology;  Laterality: N/A;  I would like to do this around 1 pm on monday.    HARDWARE REMOVAL Left 06/07/2013   Procedure: HARDWARE REMOVAL;  Surgeon: Mcarthur Rossetti, MD;  Location: [place];  Service: Orthopedics;  Laterality: Left;   INTRAMEDULLARY (IM) NAIL INTERTROCHANTERIC Right 04/14/2019   Procedure: INTRAMEDULLARY (IM) NAIL INTERTROCHANTRIC;  Surgeon: Leandrew Koyanagi, MD;  Location: [place];  Service: Orthopedics;  Laterality: Right;   IR THORACENTESIS ASP PLEURAL SPACE W/IMG GUIDE  10/30/2021   ORIF HIP FRACTURE Left 11/24/2012    Procedure: OPEN REDUCTION INTERNAL FIXATION HIP;  Surgeon: Sanjuana Kava, MD;  Location: [facility];  Service: Orthopedics;  Laterality: Left;   SPINE SURGERY     TOTAL HIP ARTHROPLASTY Left 06/07/2013   Procedure: REMOVE HARDWARE LEFT HIP AND LEFT TOTAL HIP ARTHROPLASTY ANTERIOR APPROACH;  Surgeon: Mcarthur Rossetti, MD;  Location: [place];  Service: Orthopedics;  Laterality: Left;   TRANSURETHRAL RESECTION OF PROSTATE N/A 03/22/2013   Procedure: TRANSURETHRAL RESECTION OF THE PROSTATE (TURP);  Surgeon: Marissa Nestle, MD;  Location: [facility];  Service: Urology;  Laterality: N/A;     Social History:   reports that he has been smoking cigarettes. He has a 15.00 pack-year smoking history. He has never used smokeless tobacco. He reports that he does not currently use alcohol. He reports that he does not currently use drugs after having used the following drugs: Marijuana and Cocaine.   Family History:  His family history includes Mental illness in his sister. There is no history of Colon cancer.   Allergies No Known Allergies   Home Medications  Prior to Admission medications   Medication Sig Start Date End Date Taking? Authorizing Provider  acetaminophen (TYLENOL) 325 MG tablet Take 650 mg by mouth every 6 (six) hours as needed.  Yes [provider]  Carboxymethylcellul-Glycerin (CLEAR EYES FOR DRY EYES) 1-0.25 % SOLN Apply 1-2 drops to eye 2 (two) times daily as needed.   Yes [provider]  ibuprofen (ADVIL) 200 MG tablet Take 200 mg by mouth every 6 (six) hours as needed for moderate pain.   Yes [provider]  PROAIR HFA 108 (90 Base) MCG/ACT inhaler INHALE 2 PUFFS INTO THE LUNGS EVERY 4 TO 6 HOURS AS NEEDED. Patient taking differently: Inhale 2 puffs into the lungs every 4 (four) hours as needed for wheezing or shortness of breath. 08/28/16  Yes Susy Frizzle, MD  SYMBICORT 160-4.5 MCG/ACT inhaler Inhale 1 puff into the lungs 2 (two) times daily. 02/08/19   Yes [provider]  diclofenac Sodium (VOLTAREN) 1 % GEL Apply 2 g topically 4 (four) times daily. 11/04/20   Tacy Learn, PA-C  sertraline (ZOLOFT) 25 MG tablet Take 25 mg by mouth daily. 07/10/21   [provider]  tiZANidine (ZANAFLEX) 2 MG tablet Take 2 mg by mouth 2 (two) times daily as needed. 05/31/19   [provider]

## 2021-11-01 NOTE — Anesthesia Procedure Notes (Signed)
Procedure Name: Intubation Date/Time: 11/17/2021 10:51 AM  Performed by: Rande Brunt, CRNAPre-anesthesia Checklist: Patient identified, Emergency Drugs available, Suction available and Patient being monitored Patient Re-evaluated:Patient Re-evaluated prior to induction Oxygen Delivery Method: Circle System Utilized Preoxygenation: Pre-oxygenation with 100% oxygen Induction Type: IV induction Ventilation: Mask ventilation without difficulty Laryngoscope Size: Miller and 2 Grade View: Grade I Tube type: Oral Tube size: 8.5 mm Number of attempts: 1 Airway Equipment and Method: Stylet Placement Confirmation: ETT inserted through vocal cords under direct vision, positive ETCO2 and breath sounds checked- equal and bilateral Secured at: 22 cm Tube secured with: Tape Dental Injury: Teeth and Oropharynx as per pre-operative assessment

## 2021-11-01 NOTE — Op Note (Signed)
Flexible and EBUS Bronchoscopy Procedure Note  Mohd. Derflinger Lynds  [id number]  07-01-1955  Date:10/29/2021  Time:11:21 AM   Provider Performing:Davinder Haff C Tamala Julian   Procedure: Flexible bronchoscopy and EBUS Bronchoscopy  Indication(s) Lung masses  Consent Risks of the procedure as well as the alternatives and risks of each were explained to the patient and/or caregiver.  Consent for the procedure was obtained.  Anesthesia General Anesthesia   Time Out Verified patient identification, verified procedure, site/side was marked, verified correct patient position, special equipment/implants available, medications/allergies/relevant history reviewed, required imaging and test results available.   Sterile Technique Usual hand hygiene, masks, gowns, and gloves were used   Procedure Description The EBUS bronchoscope was advanced into airway with the mass posterior to the right mainstem (called 4R) biopsied and sent for slide, cell block, and/or culture.  The EBUS bronchoscope was removed after assuring no active bleeding from biopsy site.  Findings:  - Multiple pathologic mediastinal nodes   Complications/Tolerance None; patient tolerated the procedure well. Chest X-ray is not needed post procedure.   EBL Minimal   Specimen(s) Mediastinal mass (posterior to R mainstem, labeled 4R) slide and cell block

## 2021-11-01 NOTE — Anesthesia Preprocedure Evaluation (Addendum)
Anesthesia Evaluation  Patient identified by MRN, date of birth, ID band Patient awake    Reviewed: Allergy & Precautions, NPO status , Patient's Chart, lab work & pertinent test results  History of Anesthesia Complications Negative for: history of anesthetic complications  Airway Mallampati: I  TM Distance: >3 FB Neck ROM: Full    Dental  (+) Edentulous Lower, Edentulous Upper   Pulmonary asthma , COPD,  COPD inhaler, Current Smoker and Patient abstained from smoking.,  Lung mass   Pulmonary exam normal        Cardiovascular hypertension, Normal cardiovascular exam     Neuro/Psych Anxiety Depression Bipolar Disorder negative neurological ROS     GI/Hepatic Neg liver ROS, GERD  ,  Endo/Other  negative endocrine ROS  Renal/GU negative Renal ROS  negative genitourinary   Musculoskeletal  (+) Arthritis ,   Abdominal   Peds  Hematology negative hematology ROS (+)   Anesthesia Other Findings Day of surgery medications reviewed with patient.  Reproductive/Obstetrics negative OB ROS                            Anesthesia Physical Anesthesia Plan  ASA: 3  Anesthesia Plan: General   Post-op Pain Management: Minimal or no pain anticipated   Induction: Intravenous  PONV Risk Score and Plan: 2 and Treatment may vary due to age or medical condition, Ondansetron, Dexamethasone and Midazolam  Airway Management Planned: Oral ETT  Additional Equipment: None  Intra-op Plan:   Post-operative Plan: Extubation in OR  Informed Consent: I have reviewed the patients History and Physical, chart, labs and discussed the procedure including the risks, benefits and alternatives for the proposed anesthesia with the patient or authorized representative who has indicated his/her understanding and acceptance.     Dental advisory given  Plan Discussed with: CRNA  Anesthesia Plan Comments:         Anesthesia Quick Evaluation

## 2021-11-01 NOTE — Progress Notes (Signed)
Initial Nutrition Assessment  DOCUMENTATION CODES:   Underweight  INTERVENTION:   Ensure Enlive po BID, each supplement provides 350 kcal and 20 grams of protein. Multivitamin w/ minerals daily Meal ordering with assist Liberalize pt diet to regular due to increased needs and underweight status Encourage good PO intake  NUTRITION DIAGNOSIS:   Increased nutrient needs related to chronic illness as evidenced by estimated needs.  GOAL:   Patient will meet greater than or equal to 90% of their needs  MONITOR:   PO intake, Supplement acceptance, I & O's, Labs, Weight trends  REASON FOR ASSESSMENT:   Consult Assessment of nutrition requirement/status  ASSESSMENT:   66 y.o. male presented to the ED with coughing up blood. PMH includes COPD, tobacco use, HTN, and GERD. Pt admitted with new found lung mass.   9/08 - Thoracentesis, removed 2 L  Pt reports that his appetite has been good. That his sister does majority of his cooking for him, but occasionally he will cook for himself. Pt reports that he likes Ensure and is agreeable to RD ordering. RD provided pt with menu and reviewed options he can have. Discussed with RN to assist in ordering meals. Pt endorses weight loss recently, but was unable to provide an amount. Per EMR, pt has had a 10% weight loss, this is not clinically significant for time frame.   Medications reviewed.  Labs reviewed: Sodium 132, Magnesium 1.6   NUTRITION - FOCUSED PHYSICAL EXAM:  Deferred to follow-up.   Diet Order:   Diet Order             Diet Heart Room service appropriate? No; Fluid consistency: Thin  Diet effective now                   EDUCATION NEEDS:   No education needs have been identified at this time  Skin:  Skin Assessment: Reviewed RN Assessment  Last BM:  Unknown  Height:   Ht Readings from Last 1 Encounters:  11/14/2021 6\' 2"  (1.88 m)    Weight:   Wt Readings from Last 1 Encounters:  11/12/2021 59 kg     Ideal Body Weight:  86.4 kg  BMI:  Body mass index is 16.7 kg/m.  Estimated Nutritional Needs:   Kcal:  1800-2000  Protein:  90-105 grams  Fluid:  >/= 1.8 L    Hermina Barters RD, LDN Clinical Dietitian See [facility] for contact information.

## 2021-11-01 NOTE — Transfer of Care (Signed)
Immediate Anesthesia Transfer of Care Note  Patient: Duane Richardson  Procedure(s) Performed: VIDEO BRONCHOSCOPY WITH ENDOBRONCHIAL ULTRASOUND BRONCHIAL NEEDLE ASPIRATION BIOPSIES  Patient Location: PACU  Anesthesia Type:General  Level of Consciousness: awake, alert  and oriented  Airway & Oxygen Therapy: Patient Spontanous Breathing  Post-op Assessment: Report given to RN, Post -op Vital signs reviewed and stable and Patient moving all extremities X 4  Post vital signs: Reviewed and stable  Last Vitals:  Vitals Value Taken Time  BP 124/86 11/21/2021 1133  Temp    Pulse 101 11/22/2021 1136  Resp 19 10/29/2021 1138  SpO2 91 % 11/05/2021 1136  Vitals shown include unvalidated device data.  Last Pain:  Vitals:   11/03/2021 1030  TempSrc: Temporal  PainSc: 0-No pain         Complications: No notable events documented.

## 2021-11-01 NOTE — Procedures (Signed)
PROCEDURE SUMMARY:  Successful US guided right thoracentesis. Yielded 2 L of bloody fluid. Pt tolerated procedure well. No immediate complications.  Specimen sent for labs. CXR ordered; no post-procedure pneumothorax  EBL < 2 mL  Theresa Duty, NP 11/07/2021 10:06 AM

## 2021-11-01 NOTE — Progress Notes (Signed)
PROGRESS NOTE  Irma Delancey Siegenthaler  DOB: 1955-08-14  PCP: Carrolyn Meiers, MD GMW:[id number]  DOA: 11/22/2021  LOS: 1 day  Hospital Day: 2  Brief narrative: Duane Richardson is a 66 y.o. male with PMH significant for chronic smoking, COPD, HTN, HLD, anxiety/depression, GERD Patient presented to ED at [facility] on 9/7 with complaint of coughing up blood for 3 days.  Patient is a chronic smoker for last 50 years, states he has been cutting down in smokes only about 2 cigarettes a day lately.  He has chronic cough which is recently been worsening.  For the last 3 days prior to presentation, patient saw blood in his sputum and hence presented to the ED.  In the ED, patient was afebrile, tachycardic to 117, blood pressure 90s He was significantly thin built with BMI of 16.7 Labs showed WBC count elevated 33, hemoglobin and platelet normal, sodium low at 131 Respiratory virus panel negative for flu and COVID. CT angiography chest with contrast ruled out PE but showed right lung and pleural based masses with a large malignant right pleural effusion. Significant mass effect with near complete compressive atelectasis of the right middle and right lower lobes.  It also showed hepatic left adrenal, and left upper abdomen metastatic  Case was discussed with Dr. Lindi Adie (oncologist at [facility]) who recommended admission to [facility].   Admitted to [facility] service  Pulmonary and oncology consulted   Subjective: Patient was seen and examined this afternoon.  Pleasant elderly African-American male.  Lying on bed.  Not in distress.  Mild audible wheezing but breathing without supplemental oxygen. Chart reviewed Heart rate in low 100s, blood pressure in 90s Breathing on room air Labs this morning with WBC count of 34.2, magnesium low at 1.6, sodium at 132  Assessment and plan: Suspect right lung cancer with metastatis Lifelong smoker presented with hemoptysis Imaging finding as above showing right hilar 4.6 cm  mass and pleural-based masses, malignant right pleural effusion and metastasis to liver and left adrenal gland. Oncology, pulmonary consulted. Underwent endobronchial ultrasound with biopsy today. MRI brain pending Oncology consult***   Large right pleural effusion -presumed malignant Ultrasound-guided thoracentesis was done this morning with removal of 2 L of fluid  COPD  chronic daily smoker Continue Symbicort, ProAir Nicotine patch offered Not requiring supplemental oxygen at this time.   Leukocytosis WBC count elevated to over 30,000 but no clear evidence of infection at this time.   Continue to monitor Recent Labs  Lab 11/05/2021 2012 11/17/2021 0329  WBC 33.8* 34.2*     Mild hyponatremia Continue to monitor Recent Labs  Lab 11/04/2021 1700 10/30/2021 0329  NA 131* 132*    Severe protein calorie malnutrition Body mass index is 16.7 kg/m. Low albumin level  Dietitian consult     Depression Continue Zoloft    Goals of care   Code Status: Full Code    Mobility: Encourage ambulation  Skin assessment: None    Nutritional status: None Body mass index is 16.7 kg/m.       {Tip this will not be part of the note when signed Body mass index is 16.7 kg/m. , ,  (Optional):26781}   Diet:  Diet Order             Diet Heart Room service appropriate? Yes with Assist; Fluid consistency: Thin  Diet effective now                   DVT prophylaxis:  SCDs  Start: 10/30/2021 0102   Antimicrobials: None Fluid: None Consultants: Pulmonology, oncology Family Communication: ***  Status is: Inpatient  Continue in-hospital care because: Ongoing work-up for malignancy Level of care: Telemetry Medical   Dispo: The patient is from: Home              Anticipated d/c is to: Pending clinical course              Patient currently is not medically stable to d/c.   Difficult to place patient No     Infusions:    Scheduled Meds:  feeding supplement  237 mL  Oral BID BM   mometasone-formoterol  2 puff Inhalation BID   sertraline  25 mg Oral Daily    PRN meds: acetaminophen **OR** acetaminophen, albuterol, ipratropium-albuterol   Antimicrobials: Anti-infectives (From admission, onward)    None       Objective: Vitals:   11/06/2021 1145 11/18/2021 1208  BP: 102/82 108/83  Pulse: 96 (!) 102  Resp: 18 20  Temp: 98.5 F (36.9 C) 97.6 F (36.4 C)  SpO2: 93%    No intake or output data in the 24 hours ending 10/28/2021 1258 Filed Weights   11/22/2021 1634  Weight: 59 kg   Weight change:  Body mass index is 16.7 kg/m.   Physical Exam: General exam: Pleasant, elderly African-American male. Skin: No rashes, lesions or ulcers. HEENT: Atraumatic, normocephalic, no obvious bleeding Lungs: Mild end expiratory wheezing bilaterally CVS: Regular rate and rhythm, no murmur GI/Abd soft, nontender, nondistended, bowel sound present CNS: Alert, awake, oriented x3 Psychiatry: Mood appropriate Extremities: No pedal edema, no calf tenderness  Data Review: I have personally reviewed the laboratory data and studies available.  F/u labs ordered Unresulted Labs (From admission, onward)     Start     Ordered   10/29/2021 0944  Body fluid cell count with differential  RELEASE UPON ORDERING,   TIMED        10/26/2021 0944   11/20/2021 0944  Culture, body fluid w Gram Stain-bottle  Once,   R        10/30/2021 0944   11/02/2021 0944  Gram stain  Once,   R        10/28/2021 0944   11/16/2021 1835  CBC with Differential/Platelet  Once,   STAT        11/12/2021 1835            Signed, Terrilee Croak, MD [facility] 10/27/2021

## 2021-11-02 DIAGNOSIS — R911 Solitary pulmonary nodule: Secondary | ICD-10-CM | POA: Diagnosis not present

## 2021-11-02 LAB — CBC WITH DIFFERENTIAL/PLATELET
Abs Immature Granulocytes: 0 10*3/uL (ref 0.00–0.07)
Basophils Absolute: 0 10*3/uL (ref 0.0–0.1)
Basophils Relative: 0 %
Eosinophils Absolute: 0.4 10*3/uL (ref 0.0–0.5)
Eosinophils Relative: 1 %
HCT: 38.1 % — ABNORMAL LOW (ref 39.0–52.0)
Hemoglobin: 13.3 g/dL (ref 13.0–17.0)
Lymphocytes Relative: 0 %
Lymphs Abs: 0 10*3/uL — ABNORMAL LOW (ref 0.7–4.0)
MCH: 32 pg (ref 26.0–34.0)
MCHC: 34.9 g/dL (ref 30.0–36.0)
MCV: 91.6 fL (ref 80.0–100.0)
Monocytes Absolute: 2.6 10*3/uL — ABNORMAL HIGH (ref 0.1–1.0)
Monocytes Relative: 7 %
Neutro Abs: 34.5 10*3/uL — ABNORMAL HIGH (ref 1.7–7.7)
Neutrophils Relative %: 92 %
Platelets: 383 10*3/uL (ref 150–400)
RBC: 4.16 MIL/uL — ABNORMAL LOW (ref 4.22–5.81)
RDW: 13.2 % (ref 11.5–15.5)
WBC: 37.5 10*3/uL — ABNORMAL HIGH (ref 4.0–10.5)
nRBC: 0 % (ref 0.0–0.2)

## 2021-11-02 LAB — BASIC METABOLIC PANEL
Anion gap: 7 (ref 5–15)
BUN: 16 mg/dL (ref 8–23)
CO2: 20 mmol/L — ABNORMAL LOW (ref 22–32)
Calcium: 8 mg/dL — ABNORMAL LOW (ref 8.9–10.3)
Chloride: 107 mmol/L (ref 98–111)
Creatinine, Ser: 0.7 mg/dL (ref 0.61–1.24)
GFR, Estimated: >60 mL/min (ref >60)
Glucose, Bld: 132 mg/dL — ABNORMAL HIGH (ref 70–99)
Potassium: 3.6 mmol/L (ref 3.5–5.1)
Sodium: 134 mmol/L — ABNORMAL LOW (ref 135–145)

## 2021-11-02 MED ORDER — MAGNESIUM SULFATE 2 GM/50ML IV SOLN: 2.0000 g | Freq: Once | INTRAVENOUS | Status: DC

## 2021-11-02 MED ORDER — OXYCODONE-ACETAMINOPHEN 5-325 MG PO TABS: 1.0000 | ORAL_TABLET | Freq: Four times a day (QID) | ORAL | Status: DC | PRN

## 2021-11-02 MED ORDER — GADOBUTROL 1 MMOL/ML IV SOLN
6.0000 mL | Freq: Once | INTRAVENOUS | Status: AC | PRN
Start: 2021-11-02 — End: 2021-11-02
  Administered 2021-11-02: 6 mL via INTRAVENOUS

## 2021-11-02 MED ORDER — POLYETHYLENE GLYCOL 3350 17 G PO PACK
17.0000 g | PACK | Freq: Every day | ORAL | Status: DC | PRN
  Administered 2021-11-02: 17 g via ORAL
  Filled 2021-11-02: qty 1

## 2021-11-02 MED ORDER — SENNOSIDES-DOCUSATE SODIUM 8.6-50 MG PO TABS: 1.0000 | ORAL_TABLET | Freq: Every day | ORAL | Status: DC

## 2021-11-02 NOTE — Progress Notes (Signed)
PROGRESS NOTE  Duane Richardson  DOB: Apr 11, 1955  PCP: Carrolyn Meiers, MD DJS:[id number]  DOA: 11/03/2021  LOS: 2 days  Hospital Day: 3  Brief narrative: Duane Richardson is a 66 y.o. male with PMH significant for chronic smoking, COPD, HTN, HLD, anxiety/depression, GERD Patient presented to ED at [facility] on 9/7 with complaint of coughing up blood for 3 days.  Patient is a chronic smoker for last 50 years, states he has been cutting down in smokes only about 2 cigarettes a day lately.  He has chronic cough which is recently been worsening.  For the last 3 days prior to presentation, patient saw blood in his sputum and hence presented to the ED.  In the ED, patient was afebrile, tachycardic to 117, blood pressure 90s He was significantly thin built with BMI of 16.7 Labs showed WBC count elevated 33, hemoglobin and platelet normal, sodium low at 131 Respiratory virus panel negative for flu and COVID. CT angiography chest with contrast ruled out PE but showed right lung and pleural based masses with a large malignant right pleural effusion. Significant mass effect with near complete compressive atelectasis of the right middle and right lower lobes.  It also showed hepatic left adrenal, and left upper abdomen metastatic  Case was discussed with Dr. Lindi Adie (oncologist at [facility]) who recommended admission to [facility].   [facility]  Pulmonary and oncology consulted   Subjective: Patient was seen and examined this morning. Lying on bed.  Not in distress at rest. Abdomen distended.  Very tender to touch.  Has not had bowel movement in last several days. Feels weak.  Does not feel comfortable going home today. No family at bedside.  Assessment and plan: Suspect right lung cancer with metastatis Lifelong smoker presented with hemoptysis Imaging finding as above showing right hilar 4.6 cm mass and pleural-based masses, malignant right pleural effusion and metastasis to liver and  left adrenal gland. Oncology and pulmonary consulted. 9/8, underwent endobronchial ultrasound with biopsy . 9/8, I discussed the case with oncology Dr. Lorenso Courier.  Recommended MRI brain and CT abdomen pelvis.  MRI brain did not show any evidence of metastasis.  CT abdomen pelvis showed metastasis in the liver, left adrenal gland and left upper abdomen as seen in previous CT chest.  Large right pleural effusion -presumed malignant Ultrasound-guided thoracentesis was done this morning with removal of 2 L of fluid.  Red bloody appearance. Breathing comfortably.  Check ambulatory oxygen requirement  Abdominal pain and distention Significant abdominal distention and significant tenderness to touch.  CT abdomen yesterday did not show any acute pathology other than metastasis. Has not had a bowel movement in several days.  Start Senokot, MiraLAX as needed. Pain could also be due to abdominal metastasis.  Start Percocet as needed.  COPD  chronic daily smoker Continue Symbicort, ProAir Nicotine patch offered Not requiring supplemental oxygen at this time.   Leukocytosis WBC count elevated to over 30,000 but no clear evidence of infection at this time.   Continue to monitor Recent Labs  Lab 11/17/2021 2012 11/14/2021 0329  WBC 33.8* 34.2*   Mild hyponatremia Continue to monitor Recent Labs  Lab 10/30/2021 1700 11/17/2021 0329  NA 131* 132*   Hypomagnesemia Magnesium level was low at 1.6 yesterday.  Replacement given.  Repeat tomorrow. Recent Labs  Lab 11/11/2021 1700 11/10/2021 0329  K 3.6 3.7  MG  --  1.6*  PHOS  --  3.5   Severe protein calorie malnutrition  Body mass index is 16.7 kg/m. Low albumin level  Dietitian consult     Depression Continue Zoloft    Goals of care   Code Status: Full Code    Mobility: Encourage ambulation  Skin assessment: None    Nutritional status: None Body mass index is 16.7 kg/m.  Nutrition Problem: Increased nutrient needs Etiology: chronic  illness Signs/Symptoms: estimated needs     Diet:  Diet Order             Diet regular Room service appropriate? Yes with Assist; Fluid consistency: Thin  Diet effective now                   DVT prophylaxis:  SCDs Start: 11/06/2021 0102   Antimicrobials: None Fluid: None Consultants: Pulmonology, oncology Family Communication: None at bedside  Status is: Inpatient  Continue in-hospital care because: Significant abdominal pain, leukocytosis Level of care: Telemetry Medical   Dispo: The patient is from: Home              Anticipated d/c is to: Pending clinical course hopefully home in 1 to 2 days              Patient currently is not medically stable to d/c.   Difficult to place patient No     Infusions:    Scheduled Meds:  feeding supplement  237 mL Oral BID BM   mometasone-formoterol  2 puff Inhalation BID   multivitamin with minerals  1 tablet Oral Daily   senna-docusate  1 tablet Oral QHS   sertraline  25 mg Oral Daily    PRN meds: acetaminophen **OR** acetaminophen, albuterol, ipratropium-albuterol, oxyCODONE-acetaminophen, polyethylene glycol   Antimicrobials: Anti-infectives (From admission, onward)    None       Objective: Vitals:   11/02/21 0800 11/02/21 0926  BP: 104/80   Pulse: (!) 108   Resp: 20   Temp: 98.4 F (36.9 C)   SpO2: 91% 92%   No intake or output data in the 24 hours ending 11/02/21 1204 Filed Weights   11/19/2021 1634  Weight: 59 kg   Weight change:  Body mass index is 16.7 kg/m.   Physical Exam: General exam: Pleasant, elderly African-American male. Skin: No rashes, lesions or ulcers. HEENT: Atraumatic, normocephalic, no obvious bleeding Lungs: Mild end expiratory wheezing bilaterally CVS: Regular rate and rhythm, no murmur GI/Abd soft, significantly tender abdomen, distended, bowel sound present CNS: Alert, awake, oriented x3 Psychiatry: Mood appropriate Extremities: No pedal edema, no calf  tenderness  Data Review: I have personally reviewed the laboratory data and studies available.  F/u labs ordered Unresulted Labs (From admission, onward)     Start     Ordered   11/03/21 0500  CBC with Differential/Platelet  Tomorrow morning,   R       Question:  Specimen collection method  Answer:  Lab=Lab collect   11/02/21 1204   11/03/21 0500  Magnesium  Tomorrow morning,   R       Question:  Specimen collection method  Answer:  Lab=Lab collect   11/02/21 1204   11/03/21 0500  Comprehensive metabolic panel  Tomorrow morning,   R       Question:  Specimen collection method  Answer:  Lab=Lab collect   11/02/21 1204   11/02/21 0500  CBC with Differential/Platelet  Tomorrow morning,   R       Question:  Specimen collection method  Answer:  Lab=Lab collect   11/03/2021 1259   11/02/21  3491  Basic metabolic panel  Tomorrow morning,   R       Question:  Specimen collection method  Answer:  Lab=Lab collect   11/14/2021 1259   11/13/2021 1835  CBC with Differential/Platelet  Once,   STAT        11/06/2021 1835            Signed, Terrilee Croak, MD [facility] 11/02/2021

## 2021-11-02 NOTE — Progress Notes (Signed)
SATURATION QUALIFICATIONS: (This note is used to comply with regulatory documentation for home oxygen)  Patient Saturations on Room Air at Rest = 92%  Patient Saturations on Room Air while Ambulating = 85%  Patient Saturations on 2 Liters of oxygen while Ambulating = 92%  Please briefly explain why patient needs home oxygen: Work and demand for oxygen increased for pt when ambulating.

## 2021-11-03 ENCOUNTER — Inpatient Hospital Stay (HOSPITAL_COMMUNITY): Payer: Medicare Other

## 2021-11-03 ENCOUNTER — Encounter (HOSPITAL_COMMUNITY): Payer: Self-pay | Admitting: Internal Medicine

## 2021-11-03 DIAGNOSIS — R918 Other nonspecific abnormal finding of lung field: Secondary | ICD-10-CM | POA: Diagnosis not present

## 2021-11-03 DIAGNOSIS — R911 Solitary pulmonary nodule: Secondary | ICD-10-CM | POA: Diagnosis not present

## 2021-11-03 LAB — POCT I-STAT 7, (LYTES, BLD GAS, ICA,H+H)
Acid-base deficit: 2 mmol/L (ref 0.0–2.0)
Acid-base deficit: 7 mmol/L — ABNORMAL HIGH (ref 0.0–2.0)
Bicarbonate: 28.2 mmol/L — ABNORMAL HIGH (ref 20.0–28.0)
Bicarbonate: 17.7 mmol/L — ABNORMAL LOW (ref 20.0–28.0)
Calcium, Ion: 1.26 mmol/L (ref 1.15–1.40)
Calcium, Ion: 1.1 mmol/L — ABNORMAL LOW (ref 1.15–1.40)
HCT: 42 % (ref 39.0–52.0)
HCT: 36 % — ABNORMAL LOW (ref 39.0–52.0)
Hemoglobin: 14.3 g/dL (ref 13.0–17.0)
Hemoglobin: 12.2 g/dL — ABNORMAL LOW (ref 13.0–17.0)
O2 Saturation: 98 %
O2 Saturation: 92 %
Patient temperature: 98.5
Potassium: 4.2 mmol/L (ref 3.5–5.1)
Potassium: 4.7 mmol/L (ref 3.5–5.1)
Sodium: 135 mmol/L (ref 135–145)
Sodium: 131 mmol/L — ABNORMAL LOW (ref 135–145)
TCO2: 30 mmol/L (ref 22–32)
TCO2: 19 mmol/L — ABNORMAL LOW (ref 22–32)
pCO2 arterial: 72.3 mmHg (ref 32–48)
pCO2 arterial: 33.2 mmHg (ref 32–48)
pH, Arterial: 7.199 — CL (ref 7.35–7.45)
pH, Arterial: 7.334 — ABNORMAL LOW (ref 7.35–7.45)
pO2, Arterial: 127 mmHg — ABNORMAL HIGH (ref 83–108)
pO2, Arterial: 67 mmHg — ABNORMAL LOW (ref 83–108)

## 2021-11-03 LAB — COMPREHENSIVE METABOLIC PANEL
ALT: 8 U/L (ref 0–44)
ALT: 7 U/L (ref 0–44)
AST: 20 U/L (ref 15–41)
AST: 22 U/L (ref 15–41)
Albumin: 2.3 g/dL — ABNORMAL LOW (ref 3.5–5.0)
Albumin: 1.8 g/dL — ABNORMAL LOW (ref 3.5–5.0)
Alkaline Phosphatase: 81 U/L (ref 38–126)
Alkaline Phosphatase: 67 U/L (ref 38–126)
Anion gap: 13 (ref 5–15)
Anion gap: 15 (ref 5–15)
BUN: 22 mg/dL (ref 8–23)
BUN: 27 mg/dL — ABNORMAL HIGH (ref 8–23)
CO2: 20 mmol/L — ABNORMAL LOW (ref 22–32)
CO2: 18 mmol/L — ABNORMAL LOW (ref 22–32)
Calcium: 9 mg/dL (ref 8.9–10.3)
Calcium: 8.5 mg/dL — ABNORMAL LOW (ref 8.9–10.3)
Chloride: 101 mmol/L (ref 98–111)
Chloride: 104 mmol/L (ref 98–111)
Creatinine, Ser: 1 mg/dL (ref 0.61–1.24)
Creatinine, Ser: 1.26 mg/dL — ABNORMAL HIGH (ref 0.61–1.24)
GFR, Estimated: >60 mL/min (ref >60)
GFR, Estimated: >60 mL/min (ref >60)
Glucose, Bld: 187 mg/dL — ABNORMAL HIGH (ref 70–99)
Glucose, Bld: 144 mg/dL — ABNORMAL HIGH (ref 70–99)
Potassium: 4.8 mmol/L (ref 3.5–5.1)
Potassium: 5.3 mmol/L — ABNORMAL HIGH (ref 3.5–5.1)
Sodium: 134 mmol/L — ABNORMAL LOW (ref 135–145)
Sodium: 137 mmol/L (ref 135–145)
Total Bilirubin: 0.4 mg/dL (ref 0.3–1.2)
Total Bilirubin: 0.9 mg/dL (ref 0.3–1.2)
Total Protein: 6 g/dL — ABNORMAL LOW (ref 6.5–8.1)
Total Protein: 4.9 g/dL — ABNORMAL LOW (ref 6.5–8.1)

## 2021-11-03 LAB — CBC
HCT: 43 % (ref 39.0–52.0)
Hemoglobin: 13.9 g/dL (ref 13.0–17.0)
MCH: 31.7 pg (ref 26.0–34.0)
MCHC: 32.3 g/dL (ref 30.0–36.0)
MCV: 97.9 fL (ref 80.0–100.0)
Platelets: 358 10*3/uL (ref 150–400)
RBC: 4.39 MIL/uL (ref 4.22–5.81)
RDW: 13.2 % (ref 11.5–15.5)
WBC: 34.3 10*3/uL — ABNORMAL HIGH (ref 4.0–10.5)
nRBC: 0 % (ref 0.0–0.2)

## 2021-11-03 LAB — LACTIC ACID, PLASMA
Lactic Acid, Venous: 6.4 mmol/L (ref 0.5–1.9)
Lactic Acid, Venous: 4.9 mmol/L (ref 0.5–1.9)

## 2021-11-03 LAB — CBC WITH DIFFERENTIAL/PLATELET
Abs Immature Granulocytes: 0.88 10*3/uL — ABNORMAL HIGH (ref 0.00–0.07)
Basophils Absolute: 0.2 10*3/uL — ABNORMAL HIGH (ref 0.0–0.1)
Basophils Relative: 0 %
Eosinophils Absolute: 0.1 10*3/uL (ref 0.0–0.5)
Eosinophils Relative: 0 %
HCT: 39.7 % (ref 39.0–52.0)
Hemoglobin: 13.9 g/dL (ref 13.0–17.0)
Immature Granulocytes: 2 %
Lymphocytes Relative: 3 %
Lymphs Abs: 1 10*3/uL (ref 0.7–4.0)
MCH: 32.2 pg (ref 26.0–34.0)
MCHC: 35 g/dL (ref 30.0–36.0)
MCV: 91.9 fL (ref 80.0–100.0)
Monocytes Absolute: 5 10*3/uL — ABNORMAL HIGH (ref 0.1–1.0)
Monocytes Relative: 13 %
Neutro Abs: 30.7 10*3/uL — ABNORMAL HIGH (ref 1.7–7.7)
Neutrophils Relative %: 82 %
Platelets: 413 10*3/uL — ABNORMAL HIGH (ref 150–400)
RBC: 4.32 MIL/uL (ref 4.22–5.81)
RDW: 13.3 % (ref 11.5–15.5)
Smear Review: NORMAL
WBC: 39.3 10*3/uL — ABNORMAL HIGH (ref 4.0–10.5)
nRBC: 0 % (ref 0.0–0.2)

## 2021-11-03 LAB — GLUCOSE, CAPILLARY
Glucose-Capillary: 164 mg/dL — ABNORMAL HIGH (ref 70–99)
Glucose-Capillary: 130 mg/dL — ABNORMAL HIGH (ref 70–99)
Glucose-Capillary: 148 mg/dL — ABNORMAL HIGH (ref 70–99)
Glucose-Capillary: 161 mg/dL — ABNORMAL HIGH (ref 70–99)

## 2021-11-03 LAB — MAGNESIUM: Magnesium: 2.1 mg/dL (ref 1.7–2.4)

## 2021-11-03 LAB — MRSA NEXT GEN BY PCR, NASAL: MRSA by PCR Next Gen: NOT DETECTED

## 2021-11-03 MED ORDER — FENTANYL CITRATE PF 50 MCG/ML IJ SOSY: 25.0000 ug | PREFILLED_SYRINGE | INTRAMUSCULAR | Status: DC | PRN

## 2021-11-03 MED ORDER — LACTATED RINGERS BOLUS PEDS: 1000.0000 mL | Freq: Once | INTRAVENOUS | Status: DC

## 2021-11-03 MED ORDER — VANCOMYCIN HCL IN DEXTROSE 1-5 GM/200ML-% IV SOLN: 1000.0000 mg | INTRAVENOUS | Status: DC

## 2021-11-03 MED ORDER — NOREPINEPHRINE 4 MG/250ML-% IV SOLN: 0.0000 ug/min | INTRAVENOUS | Status: DC

## 2021-11-03 MED ORDER — CHLORHEXIDINE GLUCONATE CLOTH 2 % EX PADS
6.0000 | MEDICATED_PAD | Freq: Every day | CUTANEOUS | Status: DC
Start: 2021-11-03 — End: 2021-11-15
  Administered 2021-11-03 – 2021-11-14 (×18): 6 via TOPICAL

## 2021-11-03 MED ORDER — VANCOMYCIN HCL 1250 MG/250ML IV SOLN: 1250.0000 mg | INTRAVENOUS | Status: DC

## 2021-11-03 MED ORDER — FENTANYL CITRATE PF 50 MCG/ML IJ SOSY
25.0000 ug | PREFILLED_SYRINGE | INTRAMUSCULAR | Status: DC | PRN
  Administered 2021-11-03: 100 ug via INTRAVENOUS
  Administered 2021-11-03 (×2): 50 ug via INTRAVENOUS
  Administered 2021-11-04 – 2021-11-07 (×2): 100 ug via INTRAVENOUS
  Administered 2021-11-11 – 2021-11-13 (×3): 50 ug via INTRAVENOUS
  Administered 2021-11-13 (×2): 100 ug via INTRAVENOUS
  Administered 2021-11-13: 50 ug via INTRAVENOUS
  Administered 2021-11-14 (×3): 100 ug via INTRAVENOUS
  Filled 2021-11-03: qty 1
  Filled 2021-11-03: qty 2
  Filled 2021-11-03: qty 1
  Filled 2021-11-03: qty 2
  Filled 2021-11-03: qty 1
  Filled 2021-11-03 (×2): qty 2
  Filled 2021-11-03 (×2): qty 1
  Filled 2021-11-03 (×2): qty 2
  Filled 2021-11-03: qty 1

## 2021-11-03 MED ORDER — POLYETHYLENE GLYCOL 3350 17 G PO PACK: 17.0000 g | PACK | Freq: Every day | ORAL | Status: DC

## 2021-11-03 MED ORDER — MIDAZOLAM HCL 2 MG/2ML IJ SOLN
INTRAMUSCULAR | Status: AC
  Filled 2021-11-03: qty 2

## 2021-11-03 MED ORDER — ETOMIDATE 2 MG/ML IV SOLN
INTRAVENOUS | Status: AC
  Filled 2021-11-03: qty 20

## 2021-11-03 MED ORDER — SODIUM CHLORIDE 0.9 % IV SOLN: 2.0000 g | Freq: Three times a day (TID) | INTRAVENOUS | Status: DC

## 2021-11-03 MED ORDER — NOREPINEPHRINE 4 MG/250ML-% IV SOLN
0.0000 ug/min | INTRAVENOUS | Status: DC
  Administered 2021-11-03: 22 ug/min via INTRAVENOUS
  Administered 2021-11-03: 12 ug/min via INTRAVENOUS
  Filled 2021-11-03: qty 250

## 2021-11-03 MED ORDER — ACETAMINOPHEN 650 MG RE SUPP: 650.0000 mg | Freq: Four times a day (QID) | RECTAL | Status: DC | PRN

## 2021-11-03 MED ORDER — SODIUM CHLORIDE 0.9 % IV SOLN
250.0000 mL | INTRAVENOUS | Status: DC
  Administered 2021-11-13: 250 mL via INTRAVENOUS

## 2021-11-03 MED ORDER — PROCHLORPERAZINE EDISYLATE 10 MG/2ML IJ SOLN
5.0000 mg | Freq: Four times a day (QID) | INTRAMUSCULAR | Status: DC | PRN
Start: 2021-11-03 — End: 2021-11-15
  Administered 2021-11-03: 5 mg via INTRAVENOUS
  Filled 2021-11-03: qty 2

## 2021-11-03 MED ORDER — SODIUM CHLORIDE 0.9 % IV SOLN
250.0000 mL | INTRAVENOUS | Status: DC
  Administered 2021-11-03 – 2021-11-07 (×2): 250 mL via INTRAVENOUS

## 2021-11-03 MED ORDER — POLYETHYLENE GLYCOL 3350 17 G PO PACK
17.0000 g | PACK | Freq: Every day | ORAL | Status: DC
  Administered 2021-11-03: 17 g
  Filled 2021-11-03: qty 1

## 2021-11-03 MED ORDER — IPRATROPIUM-ALBUTEROL 0.5-2.5 (3) MG/3ML IN SOLN
3.0000 mL | Freq: Four times a day (QID) | RESPIRATORY_TRACT | Status: DC
Start: 2021-11-03 — End: 2021-11-10
  Administered 2021-11-03 – 2021-11-10 (×29): 3 mL via RESPIRATORY_TRACT
  Filled 2021-11-03 (×28): qty 3

## 2021-11-03 MED ORDER — NOREPINEPHRINE 4 MG/250ML-% IV SOLN
INTRAVENOUS | Status: AC
  Filled 2021-11-03: qty 250

## 2021-11-03 MED ORDER — ROCURONIUM BROMIDE 10 MG/ML (PF) SYRINGE
PREFILLED_SYRINGE | INTRAVENOUS | Status: AC
  Filled 2021-11-03: qty 10

## 2021-11-03 MED ORDER — POLYETHYLENE GLYCOL 3350 17 G PO PACK: 17.0000 g | PACK | Freq: Every day | ORAL | Status: DC | PRN

## 2021-11-03 MED ORDER — NOREPINEPHRINE 4 MG/250ML-% IV SOLN: 2.0000 ug/min | INTRAVENOUS | Status: DC

## 2021-11-03 MED ORDER — FENTANYL 2500MCG IN NS 250ML (10MCG/ML) PREMIX INFUSION
25.0000 ug/h | INTRAVENOUS | Status: DC
  Administered 2021-11-03: 100 ug/h via INTRAVENOUS
  Administered 2021-11-04 – 2021-11-05 (×4): 150 ug/h via INTRAVENOUS
  Administered 2021-11-06: 50 ug/h via INTRAVENOUS
  Administered 2021-11-07: 100 ug/h via INTRAVENOUS
  Administered 2021-11-08: 125 ug/h via INTRAVENOUS
  Filled 2021-11-03 (×9): qty 250

## 2021-11-03 MED ORDER — SUCCINYLCHOLINE CHLORIDE 200 MG/10ML IV SOSY
PREFILLED_SYRINGE | INTRAVENOUS | Status: AC
  Filled 2021-11-03: qty 10

## 2021-11-03 MED ORDER — LACTATED RINGERS IV BOLUS
1000.0000 mL | Freq: Once | INTRAVENOUS | Status: AC
  Administered 2021-11-03: 1000 mL via INTRAVENOUS

## 2021-11-03 MED ORDER — PANTOPRAZOLE SODIUM 40 MG IV SOLR
40.0000 mg | INTRAVENOUS | Status: DC
  Administered 2021-11-03 – 2021-11-04 (×2): 40 mg via INTRAVENOUS
  Filled 2021-11-03 (×2): qty 10

## 2021-11-03 MED ORDER — SODIUM ZIRCONIUM CYCLOSILICATE 10 G PO PACK
10.0000 g | PACK | Freq: Once | ORAL | Status: AC
  Administered 2021-11-03: 10 g
  Filled 2021-11-03: qty 1

## 2021-11-03 MED ORDER — VANCOMYCIN HCL 1250 MG/250ML IV SOLN
1250.0000 mg | Freq: Once | INTRAVENOUS | Status: AC
  Administered 2021-11-03: 1250 mg via INTRAVENOUS
  Filled 2021-11-03: qty 250

## 2021-11-03 MED ORDER — FENTANYL BOLUS VIA INFUSION
25.0000 ug | INTRAVENOUS | Status: DC | PRN
  Administered 2021-11-03 – 2021-11-04 (×2): 50 ug via INTRAVENOUS
  Administered 2021-11-06 (×6): 100 ug via INTRAVENOUS

## 2021-11-03 MED ORDER — ROCURONIUM BROMIDE 10 MG/ML (PF) SYRINGE
100.0000 mg | PREFILLED_SYRINGE | Freq: Once | INTRAVENOUS | Status: DC
Start: 2021-11-03 — End: 2021-11-03
  Filled 2021-11-03: qty 10

## 2021-11-03 MED ORDER — MIDAZOLAM HCL 2 MG/2ML IJ SOLN
1.0000 mg | INTRAMUSCULAR | Status: DC | PRN
Start: 2021-11-03 — End: 2021-11-09
  Administered 2021-11-06 – 2021-11-07 (×3): 1 mg via INTRAVENOUS
  Filled 2021-11-03 (×4): qty 2

## 2021-11-03 MED ORDER — NOREPINEPHRINE 4 MG/250ML-% IV SOLN
2.0000 ug/min | INTRAVENOUS | Status: DC
  Administered 2021-11-03: 4 ug/min via INTRAVENOUS
  Filled 2021-11-03: qty 250

## 2021-11-03 MED ORDER — VASOPRESSIN 20 UNITS/100 ML INFUSION FOR SHOCK
0.0000 [IU]/min | INTRAVENOUS | Status: DC
  Administered 2021-11-03 – 2021-11-10 (×13): 0.04 [IU]/min via INTRAVENOUS
  Administered 2021-11-10: 0.02 [IU]/min via INTRAVENOUS
  Filled 2021-11-03 (×14): qty 100

## 2021-11-03 MED ORDER — NOREPINEPHRINE 16 MG/250ML-% IV SOLN
0.0000 ug/min | INTRAVENOUS | Status: DC
  Administered 2021-11-03: 25 ug/min via INTRAVENOUS
  Administered 2021-11-04: 12 ug/min via INTRAVENOUS
  Administered 2021-11-06: 7 ug/min via INTRAVENOUS
  Administered 2021-11-07: 11 ug/min via INTRAVENOUS
  Administered 2021-11-07: 18 ug/min via INTRAVENOUS
  Administered 2021-11-08: 22 ug/min via INTRAVENOUS
  Administered 2021-11-09: 14 ug/min via INTRAVENOUS
  Filled 2021-11-03 (×7): qty 250

## 2021-11-03 MED ORDER — FENTANYL CITRATE PF 50 MCG/ML IJ SOSY
25.0000 ug | PREFILLED_SYRINGE | Freq: Once | INTRAMUSCULAR | Status: AC
  Administered 2021-11-03: 25 ug via INTRAVENOUS

## 2021-11-03 MED ORDER — DOCUSATE SODIUM 50 MG/5ML PO LIQD: 100.0000 mg | Freq: Two times a day (BID) | ORAL | Status: DC

## 2021-11-03 MED ORDER — DOCUSATE SODIUM 50 MG/5ML PO LIQD
100.0000 mg | Freq: Two times a day (BID) | ORAL | Status: DC
Start: 2021-11-03 — End: 2021-11-04
  Administered 2021-11-03: 100 mg
  Filled 2021-11-03 (×2): qty 10

## 2021-11-03 MED ORDER — FUROSEMIDE 10 MG/ML IJ SOLN
20.0000 mg | Freq: Once | INTRAMUSCULAR | Status: AC
  Administered 2021-11-03: 20 mg via INTRAVENOUS
  Filled 2021-11-03: qty 2

## 2021-11-03 MED ORDER — MIDAZOLAM HCL 2 MG/2ML IJ SOLN
1.0000 mg | INTRAMUSCULAR | Status: AC | PRN
  Administered 2021-11-03 – 2021-11-05 (×3): 1 mg via INTRAVENOUS
  Filled 2021-11-03 (×3): qty 2

## 2021-11-03 MED ORDER — SERTRALINE HCL 25 MG PO TABS
25.0000 mg | ORAL_TABLET | Freq: Every day | ORAL | Status: DC
  Filled 2021-11-03: qty 1

## 2021-11-03 MED ORDER — SENNOSIDES-DOCUSATE SODIUM 8.6-50 MG PO TABS
1.0000 | ORAL_TABLET | Freq: Every day | ORAL | Status: DC
  Administered 2021-11-03: 1
  Filled 2021-11-03: qty 1

## 2021-11-03 MED ORDER — SODIUM CHLORIDE 0.9 % IV SOLN
2.0000 g | Freq: Two times a day (BID) | INTRAVENOUS | Status: DC
Start: 2021-11-03 — End: 2021-11-08
  Administered 2021-11-03 – 2021-11-08 (×10): 2 g via INTRAVENOUS
  Filled 2021-11-03 (×9): qty 12.5

## 2021-11-03 MED ORDER — ACETAMINOPHEN 325 MG PO TABS: 650.0000 mg | ORAL_TABLET | Freq: Four times a day (QID) | ORAL | Status: DC | PRN

## 2021-11-03 MED ORDER — FENTANYL CITRATE PF 50 MCG/ML IJ SOSY
PREFILLED_SYRINGE | INTRAMUSCULAR | Status: AC
  Filled 2021-11-03: qty 1

## 2021-11-03 NOTE — Progress Notes (Addendum)
eLink Physician-Brief Progress Note Patient Name: Duane Richardson DOB: January 04, 1956 MRN: [id number]   Date of Service  11/03/2021  HPI/Events of Note  Received request for arterial line.  Pt with MAP 70s, SBP in the 80s. Pt on levophed and vasopressin.  Lactate improving 6.4 --> 4.9.  eICU Interventions  Insert arterial line.  Get ABG.  Continue to trend lactate.     Intervention Category Intermediate Interventions: Other:  Elsie Lincoln 11/03/2021, 9:27 PM  11:18 PM Arterial line is correlating with cuff pressures. ABG 7.334/33.2/67.  Lactate is trending down.  OGT is putting out gastric contents.  Pt also does not have a foley in place.   Plan> Continue on current vent settings.  Follow lactate.  Will hold off on checking abdominal pressures.   3:21 AM Lactate increased from 4.9 --> 5.4   Plan> Bladder pressure checked and is in the range of 21-23. Pt does have ileus and has OGT to LIWS.  Check on next lactate. If continues to trend up, would consult surgery.   6:32 AM Lactate trending down 5.4 --> 4.5.

## 2021-11-03 NOTE — Progress Notes (Signed)
RRT called by pt's RN at [phone] that pt was not breathing well, pt was gurgling and his SATs were low 80s and dropping. RRT told RN to place a NRB on pt and we were OTW to bedside. RN called rapid response. RRT arrived to bedside at 0746. Pt at this time was responsive to pain with eyes open with respiratory effort. SATs 79% on NRB, pt placed on bipap to assist with tidal volumes. Unable to ventilate with bipap due to low volumes and pt's respiratory effort weakened to an agonal rhythm. RRT asked for CODE cart and for CODE BLUE to be initiated for respiratory arrest. At this time, RRT began manually bagging pt. SATs maintained in the 60-70s. MD arrived to bedside and updated on events and stated that pt needed to be intubated and that he needed to call someone up to intubate. RRT responded that RRT can intubate but pt would need to be paralyzed due to his agonal respiratory effort to prevent vocal cord damage. RR asked MD if he would like RR to administer rock for intubation and MD verified order due to the emergent nature of pt's status. After administration, pt intubated via direct laryngoscope with ETT 7.0 with positive color change at 24 at lip, 23 at gums. No compressions initiated. Pt transported to ICU with PCCM at bedside without complication.

## 2021-11-03 NOTE — Plan of Care (Signed)

## 2021-11-03 NOTE — Procedures (Signed)
Central Venous Catheter Insertion Procedure Note  Duane Richardson  [id number]  1955-08-03  Date:11/03/21  Time:2:24 PM   Provider Performing:Carlia Bomkamp Rodman Pickle, MD  Procedure: Insertion of Non-tunneled Central Venous [phone]) with US guidance (48250)   Indication(s) Medication administration  Consent Risks of the procedure as well as the alternatives and risks of each were explained to the patient and/or caregiver.  Consent for the procedure was obtained and is signed in the bedside chart  Anesthesia Topical only with 1% lidocaine  Fentanyl  Timeout Verified patient identification, verified procedure, site/side was marked, verified correct patient position, special equipment/implants available, medications/allergies/relevant history reviewed, required imaging and test results available.  Sterile Technique Maximal sterile technique including full sterile barrier drape, hand hygiene, sterile gown, sterile gloves, mask, hair covering, sterile ultrasound probe cover (if used).  Procedure Description Area of catheter insertion was cleaned with chlorhexidine and draped in sterile fashion.  With real-time ultrasound guidance a central venous catheter was placed into the right internal jugular vein. Nonpulsatile blood flow and easy flushing noted in all ports.  The catheter was sutured in place and sterile dressing applied.  Complications/Tolerance None; patient tolerated the procedure well. Chest X-ray is ordered to verify placement for internal jugular or subclavian cannulation.   Chest x-ray is not ordered for femoral cannulation.  EBL Minimal  Specimen(s) None

## 2021-11-03 NOTE — Procedures (Signed)
Arterial Line Insertion Start/End9/11/2021 10:45 PM, 11/03/2021 11:05 PM  Patient location: ICU. Preanesthetic checklist: patient identified, monitors and equipment checked and timeout performed Emergency situation Right, radial was placed Catheter size: 20 G Hand hygiene performed  and maximum sterile barriers used  Allen's test indicative of satisfactory collateral circulation Attempts: 2 Procedure performed without using ultrasound guided technique. Following insertion, dressing applied and Biopatch. Post procedure assessment: normal and unchanged  Post procedure complications: second provider assisted and unsuccessful attempts. Patient tolerated the procedure well with no immediate complications.

## 2021-11-03 NOTE — Progress Notes (Signed)
Per MD request,  family members contacted via phone numbers provided In Epic, no answer. Voicemail message left with Jolaine Click with call back information.

## 2021-11-03 NOTE — Progress Notes (Signed)
An USGPIV (ultrasound guided PIV) has been placed for short-term vasopressor infusion. A correctly placed ivWatch must be used when administering Vasopressors. Should this treatment be needed beyond 72 hours, central line access should be obtained.  It will be the responsibility of the bedside nurse to follow best practice to prevent extravasations.  HS Cay Kath RN 

## 2021-11-03 NOTE — Progress Notes (Signed)
RN notified RT and Desiray, Charge RN due to pt's increased effort of breathing. SpO2 Sats decreased into 70s and Rapid Response Team  and Dahal, MD notified.

## 2021-11-03 NOTE — Consult Note (Addendum)
NAME:  Duane Richardson, MRN:  [id number], DOB:  [birth year], LOS: 3 ADMISSION DATE:  11/03/2021, CONSULTATION DATE:  11/21/2021 REFERRING MD:  Terrilee Croak, MD CHIEF COMPLAINT:  Hemoptysis, Respiratory arrest   History of Present Illness:  66 year old with tobacco abuse with COPD who presents with cough, hemoptysis, weight loss, fatigue. Imaging markedly abnormal and PCCM consulted. CTA neg for PE with large right pleural effusion and RUL mass measuring 5.3 x 3.8 cm with mass effect of right mainstem, BI, RML and RLLL with pleural based nodules, centrilobular emphysema. Had thoracentesis on 9/8 with 2L exudative bloody fluid 275.  CT A/P with probable hepatic, left adrenal and left perinrenal mets. Underwent EBUS 9/8 with sampling of 4R. He remained in the hospital for work-up for abdominal pain and constipation  Today patient had worsening respiratory status requiring 15L O2 with concern for aspiration. Rapid response called and patient had agonal breathing. Pulses reported intact. He was intubated on the floor. PCCM consulted for transfer.  Pertinent  Medical History  COPD DJD post THA HTN HLD  Significant Hospital Events: Including procedures, antibiotic start and stop dates in addition to other pertinent events   9/7 Admitted and thoracentesis 9/8 EBUS 9/10 Respiratory arrest and intubated on floor  Interim History / Subjective:  As above  Objective   Blood pressure (!) 122/93, pulse (!) 156, temperature 98.4 F (36.9 C), temperature source Temporal, resp. rate 16, height 6\' 2"  (1.88 m), weight 59 kg, SpO2 98 %.        Intake/Output Summary (Last 24 hours) at 11/03/2021 0830 Last data filed at 11/03/2021 0300 Gross per 24 hour  Intake 170 ml  Output 350 ml  Net -180 ml   Filed Weights   11/05/2021 1634  Weight: 59 kg   Physical Exam: General: Critically ill-appearing, thin male paralyzed HENT: , AT, ETT in place Eyes: EOMI, no scleral icterus Respiratory: Rhonchi bilaterally.   No crackles, wheezing or rales Cardiovascular: RRR, -M/R/G, no JVD GI: BS+, distended Extremities:-Edema,-tenderness Neuro: [facility] Problem list     Assessment & Plan:   Acute encephalopathy secondary respiratory arrest Acute hypoxemic respiratory failure secondary to suspected aspiration, post-obstructive pna --Intubated. S/p ROC --Full vent support --F/u ABG, CXR --Start Vanc/Cefepime --Obtain blood and resp cx --PAD protocol for RASS goal -1 --VAP --CT head STAT  Severe sepsis secondary unknown cause, post-obstructive pna? Prior leukocytosis before aspiration event --F/u cultures --Antibiotics as above --Trend LA  Metastatic cancer with suspected lung primary EBUS 9/8 --F/u cytology  Abdominal pain/Constipation CT A/P with no obstruction or source of infection --NGT to LIWS --Bowel regimen  Best Practice (right click and "Reselect all SmartList Selections" daily)   Diet/type: NPO DVT prophylaxis: not indicated had hemoptysis GI prophylaxis: PPI Lines: N/A Foley:  N/A Code Status:  full code Last date of multidisciplinary goals of care discussion [N/A] - Updated sister on telephone  Labs   CBC: Recent Labs  Lab 11/07/2021 2012 11/02/2021 0329 11/02/21 1516 11/03/21 0249  WBC 33.8* 34.2* 37.5* 39.3*  NEUTROABS  --   --  34.5* 30.7*  HGB 13.0 12.7* 13.3 13.9  HCT 39.7 36.5* 38.1* 39.7  MCV 98.8 91.9 91.6 91.9  PLT 321 347 383 413*    Basic Metabolic Panel: Recent Labs  Lab 11/03/2021 1700 11/04/2021 0329 11/02/21 1516 11/03/21 0249  NA 131* 132* 134* 134*  K 3.6 3.7 3.6 4.8  CL 99 102 107 101  CO2 21* 20* 20* 20*  GLUCOSE 98 113* 132* 187*  BUN 18 16 16 22   CREATININE 0.96 0.92 0.70 1.00  CALCIUM 8.1* 8.0* 8.0* 9.0  MG  --  1.6*  --  2.1  PHOS  --  3.5  --   --    GFR: Estimated Creatinine Clearance: 59 mL/min (by C-G formula based on SCr of 1 mg/dL). Recent Labs  Lab 11/10/2021 2012 11/03/2021 0329 11/02/21 1516  11/03/21 0249  WBC 33.8* 34.2* 37.5* 39.3*    Liver Function Tests: Recent Labs  Lab 10/29/2021 1700 11/13/2021 0329 11/03/21 0249  AST 20 17 20   ALT 11 7 8   ALKPHOS 98 75 81  BILITOT 1.1 1.1 0.4  PROT 6.8 5.8* 6.0*  ALBUMIN 2.7* 2.3* 2.3*   No results for input(s): "LIPASE", "AMYLASE" in the last 168 hours. No results for input(s): "AMMONIA" in the last 168 hours.  ABG    Component Value Date/Time   PHART 7.342 (L) 06/07/2013 1705   PCO2ART 44.4 06/07/2013 1705   PO2ART 66.0 (L) 06/07/2013 1705   HCO3 24.3 (H) 06/07/2013 1705   TCO2 26 06/07/2013 1705   ACIDBASEDEF 2.0 06/07/2013 1705   O2SAT 92.0 06/07/2013 1705     Coagulation Profile: No results for input(s): "INR", "PROTIME" in the last 168 hours.  Cardiac Enzymes: No results for input(s): "CKTOTAL", "CKMB", "CKMBINDEX", "TROPONINI" in the last 168 hours.  HbA1C: No results found for: "HGBA1C"  CBG: No results for input(s): "GLUCAP" in the last 168 hours.  Review of Systems:   Unable to obtain due to critical status  Past Medical History:  He,  has a past medical history of Allergy, Anxiety, Arthritis, Asthma, Atherosclerosis, Bipolar 1 disorder ([place]), Cataract, COPD (chronic obstructive pulmonary disease) ([place]), Depression, Elevated PSA, ETOH abuse, GERD (gastroesophageal reflux disease), Hyperlipidemia, Hypertension, and Right hip pain (04/13/2019).   Surgical History:   Past Surgical History:  Procedure Laterality Date   BACK SURGERY     lunbar   COLONOSCOPY  12/21/09   [facility]:ERDE papilla otherwise normal/ pancolonic diverticula/mutiple colonic poylps   COLONOSCOPY WITH ESOPHAGOGASTRODUODENOSCOPY (EGD) N/A 10/20/2012   Procedure: COLONOSCOPY WITH ESOPHAGOGASTRODUODENOSCOPY (EGD);  Surgeon: Daneil Dolin, MD;  Location: [facility];  Service: Endoscopy;  Laterality: N/A;  10;15   CYSTOSCOPY N/A 01/31/2013   Procedure: CYSTOSCOPY FLEXIBLE;  Surgeon: Marissa Nestle, MD;  Location: [facility];  Service:  Urology;  Laterality: N/A;  I would like to do this around 1 pm on monday.    HARDWARE REMOVAL Left 06/07/2013   Procedure: HARDWARE REMOVAL;  Surgeon: Mcarthur Rossetti, MD;  Location: [place];  Service: Orthopedics;  Laterality: Left;   INTRAMEDULLARY (IM) NAIL INTERTROCHANTERIC Right 04/14/2019   Procedure: INTRAMEDULLARY (IM) NAIL INTERTROCHANTRIC;  Surgeon: Leandrew Koyanagi, MD;  Location: [place];  Service: Orthopedics;  Laterality: Right;   IR THORACENTESIS ASP PLEURAL SPACE W/IMG GUIDE  11/06/2021   ORIF HIP FRACTURE Left 11/24/2012   Procedure: OPEN REDUCTION INTERNAL FIXATION HIP;  Surgeon: Sanjuana Kava, MD;  Location: [facility];  Service: Orthopedics;  Laterality: Left;   SPINE SURGERY     TOTAL HIP ARTHROPLASTY Left 06/07/2013   Procedure: REMOVE HARDWARE LEFT HIP AND LEFT TOTAL HIP ARTHROPLASTY ANTERIOR APPROACH;  Surgeon: Mcarthur Rossetti, MD;  Location: [place];  Service: Orthopedics;  Laterality: Left;   TRANSURETHRAL RESECTION OF PROSTATE N/A 03/22/2013   Procedure: TRANSURETHRAL RESECTION OF THE PROSTATE (TURP);  Surgeon: Marissa Nestle, MD;  Location: [facility];  Service: Urology;  Laterality: N/A;  Social History:   reports that he has been smoking cigarettes. He has a 15.00 pack-year smoking history. He has never used smokeless tobacco. He reports that he does not currently use alcohol. He reports that he does not currently use drugs after having used the following drugs: Marijuana and Cocaine.   Family History:  His family history includes Mental illness in his sister. There is no history of Colon cancer.   Allergies No Known Allergies   Home Medications  Prior to Admission medications   Medication Sig Start Date End Date Taking? Authorizing Provider  acetaminophen (TYLENOL) 325 MG tablet Take 650 mg by mouth every 6 (six) hours as needed.   Yes [provider]  Carboxymethylcellul-Glycerin (CLEAR EYES FOR DRY EYES) 1-0.25 % SOLN Apply 1-2 drops to eye 2 (two)  times daily as needed.   Yes [provider]  ibuprofen (ADVIL) 200 MG tablet Take 200 mg by mouth every 6 (six) hours as needed for moderate pain.   Yes [provider]  PROAIR HFA 108 (90 Base) MCG/ACT inhaler INHALE 2 PUFFS INTO THE LUNGS EVERY 4 TO 6 HOURS AS NEEDED. Patient taking differently: Inhale 2 puffs into the lungs every 4 (four) hours as needed for wheezing or shortness of breath. 08/28/16  Yes Susy Frizzle, MD  SYMBICORT 160-4.5 MCG/ACT inhaler Inhale 1 puff into the lungs 2 (two) times daily. 02/08/19  Yes [provider]  diclofenac Sodium (VOLTAREN) 1 % GEL Apply 2 g topically 4 (four) times daily. 11/04/20   Tacy Learn, PA-C  sertraline (ZOLOFT) 25 MG tablet Take 25 mg by mouth daily. 07/10/21   [provider]  tiZANidine (ZANAFLEX) 2 MG tablet Take 2 mg by mouth 2 (two) times daily as needed. 05/31/19   [provider]     Critical care time: 60 min    The patient is critically ill with multiple organ systems failure and requires high complexity decision making for assessment and support, frequent evaluation and titration of therapies, application of advanced monitoring technologies and extensive interpretation of multiple databases.  Independent Critical Care Time: 60 Minutes.   Rodman Pickle, M.D. [facility] Pulmonary/Critical Care Medicine 11/03/2021 8:31 AM   Please see Amion for pager number to reach on-call Pulmonary and Critical Care Team.

## 2021-11-03 NOTE — Progress Notes (Addendum)
Pharmacy Antibiotic Note  Duane Richardson is a 66 y.o. male admitted on 11/12/2021 with pneumonia.  Pharmacy has been consulted for Cefepime and Vancomcyin dosing. WBC elevated at 39.3. Afebrile. SCr up to 1.26. Levophed at 4.   Plan: Cefepime 2g IV every 12 hours Vancomycin 1250 mg IV x1 this AM then give 1000 mg IV every 24 hours (cAUC 551)  Monitor renal function, culture results, and clinical status.   Height: 6\' 2"  (188 cm) Weight: 59 kg (130 lb 1.1 oz) IBW/kg (Calculated) : 82.2  Temp (24hrs), Avg:98.4 F (36.9 C), Min:98 F (36.7 C), Max:98.9 F (37.2 C)  Recent Labs  Lab 11/20/2021 1700 11/10/2021 2012 11/05/2021 0329 11/02/21 1516 11/03/21 0249  WBC  --  33.8* 34.2* 37.5* 39.3*  CREATININE 0.96  --  0.92 0.70 1.00    Estimated Creatinine Clearance: 59 mL/min (by C-G formula based on SCr of 1 mg/dL).    No Known Allergies  Antimicrobials this admission: Cefepime 9/10 >>  Vancomycin 9/10 >>   Dose adjustments this admission:   Microbiology results: 9/8 Pleural fluid: (gram stain negative) 9/10 BCx:  9/10 Sputum:   9/10 MRSA PCR: negative  Thank you for allowing pharmacy to be a part of this patient's care.  Sloan Leiter, PharmD, BCPS, BCCCP Clinical Pharmacist Please refer to [facility] for [place] numbers 11/03/2021 8:57 AM

## 2021-11-03 NOTE — Code Documentation (Signed)
Paged initially as a rapid response because this patient was in respiratory distress. When I got to the room the patient was agonally breathing with O2 saturation in the 60-70's. Code blue subsequently called. Attending MD at bedside and wanted the patient intubated. Patient was unresponsive and GCS 3. MD ordered NMB prior to intubation to prevent damage to patient's vocal cords. RT intubated the patient with no complication and had positive color change. Patient transferred to [id number]-03

## 2021-11-03 NOTE — Significant Event (Addendum)
I attended the rapid response called this morning on this patient.  66 year old male with new diagnosis of lung cancer who underwent bronc and ultrasound-guided thoracentesis 2 days ago and was in a stable respiratory status yesterday.  I was not able to discharge him yesterday only because of the work-up needed for significant abdominal tenderness and no bowel movement for few days.  Per we will report overnight, patient started to vomit last night.  Around 7:00 this morning, he required 4 L oxygen by nasal cannula.  Soon after that within an hour, his respiratory status worsened and require up to 15 L oxygen by nonrebreather mask.  I wonder patient aspirated during the vomiting. O2 sat dropped down to 70s. Rapid response was called. At the time I attended, patient only had agonal breathing.  He had not lost pulse. With the consideration of impending respiratory and cardiac failure, decision was made to intubate him. Respiratory therapist was at bedside. 1 dose of paralytic was given. Successfully intubated on first attempt before the respiratory failure progressed to cardiac arrest. Chest pads were attached just in case.   Patient did not lose the pulse at any moment. Patient was then transferred to ICU. Bedside signout given to critical care Dr. Ebony Hail.  Of note, per RN Brittney who had him yesterday as well as this morning, patient did not have any bowel movement despite bowel regimen yesterday.  He looks significantly distended. I will order a portable abdominal x-ray to rule out bowel obstruction which could have been the cause of nausea vomiting last night.

## 2021-11-03 NOTE — Progress Notes (Signed)
RT assisted with patient transport on the vent to CT and returned to [id number] without complications.

## 2021-11-03 NOTE — Procedures (Signed)
Intubation Procedure Note  Rockney Grenz Gaul  [id number]  [birth year]  Date:11/03/21  Time:8:27 AM   Provider Performing:Chelsie Burel, Otho Ket T    Procedure: Intubation (40981)  Indication(s) Respiratory Failure  Consent Unable to obtain consent due to emergent nature of procedure.   Anesthesia Per MD at bedside.   Time Out Verified patient identification, verified procedure, site/side was marked, verified correct patient position, special equipment/implants available, medications/allergies/relevant history reviewed, required imaging and test results available.   Sterile Technique Usual hand hygeine, masks, and gloves were used   Procedure Description Patient positioned in bed supine.  Sedation given as noted above.  Patient was intubated with endotracheal tube using  Direct Laryngoscope .  View was Grade 3 only epiglottis .  Number of attempts was 1.  Colorimetric CO2 detector was consistent with tracheal placement.   Complications/Tolerance None; patient tolerated the procedure well. Chest X-ray is ordered to verify placement.   EBL Minimal   Specimen(s) None

## 2021-11-04 ENCOUNTER — Inpatient Hospital Stay (HOSPITAL_COMMUNITY): Payer: Medicare Other

## 2021-11-04 DIAGNOSIS — R911 Solitary pulmonary nodule: Secondary | ICD-10-CM | POA: Diagnosis not present

## 2021-11-04 DIAGNOSIS — R079 Chest pain, unspecified: Secondary | ICD-10-CM | POA: Diagnosis not present

## 2021-11-04 LAB — CBC
HCT: 36.5 % — ABNORMAL LOW (ref 39.0–52.0)
Hemoglobin: 12.6 g/dL — ABNORMAL LOW (ref 13.0–17.0)
MCH: 32.1 pg (ref 26.0–34.0)
MCHC: 34.5 g/dL (ref 30.0–36.0)
MCV: 93.1 fL (ref 80.0–100.0)
Platelets: 278 10*3/uL (ref 150–400)
RBC: 3.92 MIL/uL — ABNORMAL LOW (ref 4.22–5.81)
RDW: 13.4 % (ref 11.5–15.5)
WBC: 37 10*3/uL — ABNORMAL HIGH (ref 4.0–10.5)
nRBC: 0 % (ref 0.0–0.2)

## 2021-11-04 LAB — POCT I-STAT 7, (LYTES, BLD GAS, ICA,H+H)
Acid-base deficit: 4 mmol/L — ABNORMAL HIGH (ref 0.0–2.0)
Acid-base deficit: 4 mmol/L — ABNORMAL HIGH (ref 0.0–2.0)
Acid-base deficit: 4 mmol/L — ABNORMAL HIGH (ref 0.0–2.0)
Acid-base deficit: 3 mmol/L — ABNORMAL HIGH (ref 0.0–2.0)
Bicarbonate: 19.9 mmol/L — ABNORMAL LOW (ref 20.0–28.0)
Bicarbonate: 19.7 mmol/L — ABNORMAL LOW (ref 20.0–28.0)
Bicarbonate: 19.8 mmol/L — ABNORMAL LOW (ref 20.0–28.0)
Bicarbonate: 20.2 mmol/L (ref 20.0–28.0)
Calcium, Ion: 1.04 mmol/L — ABNORMAL LOW (ref 1.15–1.40)
Calcium, Ion: 1.05 mmol/L — ABNORMAL LOW (ref 1.15–1.40)
Calcium, Ion: 1.05 mmol/L — ABNORMAL LOW (ref 1.15–1.40)
Calcium, Ion: 1.02 mmol/L — ABNORMAL LOW (ref 1.15–1.40)
HCT: 36 % — ABNORMAL LOW (ref 39.0–52.0)
HCT: 34 % — ABNORMAL LOW (ref 39.0–52.0)
HCT: 34 % — ABNORMAL LOW (ref 39.0–52.0)
HCT: 33 % — ABNORMAL LOW (ref 39.0–52.0)
Hemoglobin: 12.2 g/dL — ABNORMAL LOW (ref 13.0–17.0)
Hemoglobin: 11.6 g/dL — ABNORMAL LOW (ref 13.0–17.0)
Hemoglobin: 11.6 g/dL — ABNORMAL LOW (ref 13.0–17.0)
Hemoglobin: 11.2 g/dL — ABNORMAL LOW (ref 13.0–17.0)
O2 Saturation: 94 %
O2 Saturation: 94 %
O2 Saturation: 96 %
O2 Saturation: 97 %
Potassium: 4.9 mmol/L (ref 3.5–5.1)
Potassium: 4.6 mmol/L (ref 3.5–5.1)
Potassium: 4.7 mmol/L (ref 3.5–5.1)
Potassium: 4.5 mmol/L (ref 3.5–5.1)
Sodium: 134 mmol/L — ABNORMAL LOW (ref 135–145)
Sodium: 133 mmol/L — ABNORMAL LOW (ref 135–145)
Sodium: 134 mmol/L — ABNORMAL LOW (ref 135–145)
Sodium: 134 mmol/L — ABNORMAL LOW (ref 135–145)
TCO2: 21 mmol/L — ABNORMAL LOW (ref 22–32)
TCO2: 21 mmol/L — ABNORMAL LOW (ref 22–32)
TCO2: 21 mmol/L — ABNORMAL LOW (ref 22–32)
TCO2: 21 mmol/L — ABNORMAL LOW (ref 22–32)
pCO2 arterial: 31.5 mmHg — ABNORMAL LOW (ref 32–48)
pCO2 arterial: 29.3 mmHg — ABNORMAL LOW (ref 32–48)
pCO2 arterial: 32.4 mmHg (ref 32–48)
pCO2 arterial: 29.9 mmHg — ABNORMAL LOW (ref 32–48)
pH, Arterial: 7.408 (ref 7.35–7.45)
pH, Arterial: 7.436 (ref 7.35–7.45)
pH, Arterial: 7.395 (ref 7.35–7.45)
pH, Arterial: 7.436 (ref 7.35–7.45)
pO2, Arterial: 69 mmHg — ABNORMAL LOW (ref 83–108)
pO2, Arterial: 67 mmHg — ABNORMAL LOW (ref 83–108)
pO2, Arterial: 79 mmHg — ABNORMAL LOW (ref 83–108)
pO2, Arterial: 87 mmHg (ref 83–108)

## 2021-11-04 LAB — LACTIC ACID, PLASMA
Lactic Acid, Venous: 5.4 mmol/L (ref 0.5–1.9)
Lactic Acid, Venous: 4.5 mmol/L (ref 0.5–1.9)
Lactic Acid, Venous: 4 mmol/L (ref 0.5–1.9)
Lactic Acid, Venous: 3.3 mmol/L (ref 0.5–1.9)
Lactic Acid, Venous: 2.9 mmol/L (ref 0.5–1.9)

## 2021-11-04 LAB — ECHOCARDIOGRAM COMPLETE
AR max vel: 4.41 cm2
AV Area VTI: 5.55 cm2
AV Area mean vel: 4.79 cm2
AV Mean grad: 2 mmHg
AV Peak grad: 4.6 mmHg
Ao pk vel: 1.07 m/s
Area-P 1/2: 6.02 cm2
Height: 74 in
MV M vel: 1.66 m/s
MV Peak grad: 11 mmHg
S' Lateral: 2.5 cm
Weight: 2063.51 oz

## 2021-11-04 LAB — BODY FLUID CELL COUNT WITH DIFFERENTIAL
Eos, Fluid: 1 %
Lymphs, Fluid: 13 %
Monocyte-Macrophage-Serous Fluid: 9 % — ABNORMAL LOW (ref 50–90)
Neutrophil Count, Fluid: 77 % — ABNORMAL HIGH (ref 0–25)
Total Nucleated Cell Count, Fluid: 2540 cu mm — ABNORMAL HIGH (ref 0–1000)

## 2021-11-04 LAB — GLUCOSE, CAPILLARY
Glucose-Capillary: 113 mg/dL — ABNORMAL HIGH (ref 70–99)
Glucose-Capillary: 125 mg/dL — ABNORMAL HIGH (ref 70–99)
Glucose-Capillary: 141 mg/dL — ABNORMAL HIGH (ref 70–99)
Glucose-Capillary: 141 mg/dL — ABNORMAL HIGH (ref 70–99)
Glucose-Capillary: 142 mg/dL — ABNORMAL HIGH (ref 70–99)
Glucose-Capillary: 164 mg/dL — ABNORMAL HIGH (ref 70–99)

## 2021-11-04 LAB — BASIC METABOLIC PANEL
Anion gap: 14 (ref 5–15)
BUN: 36 mg/dL — ABNORMAL HIGH (ref 8–23)
CO2: 18 mmol/L — ABNORMAL LOW (ref 22–32)
Calcium: 7.6 mg/dL — ABNORMAL LOW (ref 8.9–10.3)
Chloride: 101 mmol/L (ref 98–111)
Creatinine, Ser: 1.77 mg/dL — ABNORMAL HIGH (ref 0.61–1.24)
GFR, Estimated: 41 mL/min — ABNORMAL LOW (ref >60)
Glucose, Bld: 155 mg/dL — ABNORMAL HIGH (ref 70–99)
Potassium: 4.9 mmol/L (ref 3.5–5.1)
Sodium: 133 mmol/L — ABNORMAL LOW (ref 135–145)

## 2021-11-04 LAB — MAGNESIUM: Magnesium: 1.7 mg/dL (ref 1.7–2.4)

## 2021-11-04 LAB — LACTATE DEHYDROGENASE, PLEURAL OR PERITONEAL FLUID: LD, Fluid: 564 U/L — ABNORMAL HIGH (ref 3–23)

## 2021-11-04 LAB — URINALYSIS, COMPLETE (UACMP) WITH MICROSCOPIC
Bilirubin Urine: NEGATIVE
Glucose, UA: NEGATIVE mg/dL
Ketones, ur: NEGATIVE mg/dL
Nitrite: NEGATIVE
Protein, ur: 100 mg/dL — AB
RBC / HPF: >50 RBC/hpf — ABNORMAL HIGH (ref 0–5)
Specific Gravity, Urine: 1.018 (ref 1.005–1.030)
WBC, UA: >50 WBC/hpf — ABNORMAL HIGH (ref 0–5)
pH: 5 (ref 5.0–8.0)

## 2021-11-04 LAB — PROTEIN, PLEURAL OR PERITONEAL FLUID: Total protein, fluid: 3 g/dL

## 2021-11-04 LAB — LACTATE DEHYDROGENASE: LDH: 245 U/L — ABNORMAL HIGH (ref 98–192)

## 2021-11-04 LAB — HEMOGLOBIN AND HEMATOCRIT, BLOOD
HCT: 32.4 % — ABNORMAL LOW (ref 39.0–52.0)
Hemoglobin: 11.2 g/dL — ABNORMAL LOW (ref 13.0–17.0)

## 2021-11-04 LAB — GLUCOSE, PLEURAL OR PERITONEAL FLUID: Glucose, Fluid: 97 mg/dL

## 2021-11-04 LAB — PROTEIN, TOTAL: Total Protein: 4.8 g/dL — ABNORMAL LOW (ref 6.5–8.1)

## 2021-11-04 MED ORDER — HEPARIN SODIUM (PORCINE) 5000 UNIT/ML IJ SOLN
5000.0000 [IU] | Freq: Three times a day (TID) | INTRAMUSCULAR | Status: DC
  Administered 2021-11-04 – 2021-11-13 (×27): 5000 [IU] via SUBCUTANEOUS
  Filled 2021-11-04 (×26): qty 1

## 2021-11-04 MED ORDER — PANTOPRAZOLE 2 MG/ML SUSPENSION
40.0000 mg | Freq: Every day | ORAL | Status: DC
Start: 2021-11-05 — End: 2021-11-04

## 2021-11-04 MED ORDER — PANTOPRAZOLE SODIUM 40 MG IV SOLR
40.0000 mg | INTRAVENOUS | Status: DC
  Administered 2021-11-05: 40 mg via INTRAVENOUS
  Filled 2021-11-04: qty 10

## 2021-11-04 MED ORDER — DOCUSATE SODIUM 50 MG/5ML PO LIQD
100.0000 mg | Freq: Two times a day (BID) | ORAL | Status: DC
Start: 2021-11-04 — End: 2021-11-05
  Administered 2021-11-04 (×2): 100 mg
  Filled 2021-11-04 (×3): qty 10

## 2021-11-04 MED ORDER — ORAL CARE MOUTH RINSE: 15.0000 mL | OROMUCOSAL | Status: DC | PRN

## 2021-11-04 MED ORDER — ORAL CARE MOUTH RINSE
15.0000 mL | OROMUCOSAL | Status: DC
Start: 2021-11-04 — End: 2021-11-07
  Administered 2021-11-04 – 2021-11-06 (×33): 15 mL via OROMUCOSAL

## 2021-11-04 MED ORDER — PERFLUTREN LIPID MICROSPHERE
1.0000 mL | INTRAVENOUS | Status: AC | PRN
  Administered 2021-11-04: 2 mL via INTRAVENOUS

## 2021-11-04 MED ORDER — ADULT MULTIVITAMIN W/MINERALS CH
1.0000 | ORAL_TABLET | Freq: Every day | ORAL | Status: DC
Start: 2021-11-05 — End: 2021-11-04

## 2021-11-04 MED ORDER — LACTATED RINGERS IV BOLUS
1000.0000 mL | Freq: Once | INTRAVENOUS | Status: AC
  Administered 2021-11-04: 1000 mL via INTRAVENOUS

## 2021-11-04 MED ORDER — VANCOMYCIN HCL 1250 MG/250ML IV SOLN
1250.0000 mg | INTRAVENOUS | Status: DC
  Filled 2021-11-04: qty 250

## 2021-11-04 MED ORDER — PROPOFOL 1000 MG/100ML IV EMUL
0.0000 ug/kg/min | INTRAVENOUS | Status: DC
  Administered 2021-11-04: 30 ug/kg/min via INTRAVENOUS
  Administered 2021-11-04: 5 ug/kg/min via INTRAVENOUS
  Administered 2021-11-05: 35 ug/kg/min via INTRAVENOUS
  Filled 2021-11-04 (×3): qty 100

## 2021-11-04 MED ORDER — POLYETHYLENE GLYCOL 3350 17 G PO PACK
17.0000 g | PACK | Freq: Every day | ORAL | Status: DC
  Administered 2021-11-04 – 2021-11-14 (×7): 17 g
  Filled 2021-11-04 (×9): qty 1

## 2021-11-04 MED ORDER — MAGNESIUM SULFATE 2 GM/50ML IV SOLN
2.0000 g | Freq: Once | INTRAVENOUS | Status: AC
Start: 2021-11-04 — End: 2021-11-04
  Administered 2021-11-04: 2 g via INTRAVENOUS
  Filled 2021-11-04: qty 50

## 2021-11-04 NOTE — Progress Notes (Addendum)
Pharmacy Antibiotic Note  Duane Richardson is a 66 y.o. male admitted on 11/02/2021 with pneumonia.  Pharmacy has been consulted for Cefepime and Vancomcyin dosing. WBC elevated at 37.0. Afebrile. SCr up to 1.77. Levophed at 16.   Plan: Cefepime 2g IV every 12 hours , continue based on current renal function.  Adjust vancomycin for increasing Scr on 9/11, IV vancomycin 1250mg  Q48H (eAUC: 466) Monitor renal function, culture results, and clinical status.   Height: 6\' 2"  (188 cm) Weight: 58.5 kg (128 lb 15.5 oz) IBW/kg (Calculated) : 82.2  Temp (24hrs), Avg:98.4 F (36.9 C), Min:97.9 F (36.6 C), Max:99.8 F (37.7 C)  Recent Labs  Lab 11/20/2021 0329 11/02/21 1516 11/03/21 0249 11/03/21 1236 11/03/21 1821 11/03/21 2324 11/04/21 0539  WBC 34.2* 37.5* 39.3* 34.3*  --   --  37.0*  CREATININE 0.92 0.70 1.00 1.26*  --   --  1.77*  LATICACIDVEN  --   --   --  6.4* 4.9* 5.4* 4.5*     Estimated Creatinine Clearance: 33.1 mL/min (A) (by C-G formula based on SCr of 1.77 mg/dL (H)).    No Known Allergies  Antimicrobials this admission: Cefepime 9/10 >>  Vancomycin 9/10 >>   Dose adjustments this admission:   Microbiology results: 9/8 Pleural fluid: (gram stain negative) 9/10 BCx:  9/10 Sputum:   9/10 MRSA PCR: negative  Thank you for allowing pharmacy to be a part of this patient's care.  Esmeralda Arthur, PharmD  Clinical Pharmacist Please refer to [facility] for [place]ilda numbers 11/04/2021 8:32 AM

## 2021-11-04 NOTE — Anesthesia Postprocedure Evaluation (Signed)
Anesthesia Post Note  Patient: Duane Richardson  Procedure(s) Performed: VIDEO BRONCHOSCOPY WITH ENDOBRONCHIAL ULTRASOUND BRONCHIAL NEEDLE ASPIRATION BIOPSIES     Patient location during evaluation: PACU Anesthesia Type: General Level of consciousness: awake and alert Pain management: pain level controlled Vital Signs Assessment: post-procedure vital signs reviewed and stable Respiratory status: spontaneous breathing, nonlabored ventilation and respiratory function stable Cardiovascular status: blood pressure returned to baseline Postop Assessment: no apparent nausea or vomiting Anesthetic complications: no   No notable events documented.            Marthenia Rolling

## 2021-11-04 NOTE — Consult Note (Addendum)
NAME:  Duane Richardson, MRN:  [id number], DOB:  05-14-1955, LOS: 4 ADMISSION DATE:  11/06/2021, CONSULTATION DATE:  11/18/2021 REFERRING MD:  Terrilee Croak, MD CHIEF COMPLAINT:  Hemoptysis, Respiratory arrest   History of Present Illness:  66 year old with tobacco abuse with COPD who presents with cough, hemoptysis, weight loss, fatigue. Imaging markedly abnormal and PCCM consulted. CTA neg for PE with large right pleural effusion and RUL mass measuring 5.3 x 3.8 cm with mass effect of right mainstem, BI, RML and RLLL with pleural based nodules, centrilobular emphysema. Had thoracentesis on 9/8 with 2L exudative bloody fluid 275.  CT A/P with probable hepatic, left adrenal and left perinrenal mets. Underwent EBUS 9/8 with sampling of 4R. He remained in the hospital for work-up for abdominal pain and constipation  9/10 patient had worsening respiratory status requiring 15L O2 with concern for aspiration. Rapid response called and patient had agonal breathing. Pulses reported intact. He was intubated on the floor. PCCM consulted for transfer.  Pertinent  Medical History  COPD DJD post THA HTN HLD  Significant Hospital Events: Including procedures, antibiotic start and stop dates in addition to other pertinent events   9/7 Admitted and thoracentesis 9/8 EBUS 9/10 Respiratory arrest and intubated on floor 9/11 Central line placed  Interim History / Subjective:  Overnight central line placed and a-line placed, able to wean pressors overnight still on versed and levo. Required versed x2 for agitation, trying to pull at tube. Bladder pressures elevated, foley placed. Minimal urinary output. Patient intubated and requiring vasopressors.   Objective   Blood pressure 92/75, pulse (!) 115, temperature 99.8 F (37.7 C), temperature source Oral, resp. rate (!) 25, height 6\' 2"  (1.88 m), weight 59 kg, SpO2 98 %. CVP:  [21 mmHg] 21 mmHg  Vent Mode: PCV FiO2 (%):  [100 %] 100 % Set Rate:  [24 bmp-30 bmp] 30  bmp PEEP:  [5 cmH20] 5 cmH20 Plateau Pressure:  [24 cmH20] 24 cmH20   Intake/Output Summary (Last 24 hours) at 11/04/2021 0553 Last data filed at 11/04/2021 0500 Gross per 24 hour  Intake 4118.56 ml  Output 600 ml  Net 3518.56 ml    Filed Weights   11/05/2021 1634  Weight: 59 kg   Physical Exam: General: Critically ill-appearing, thin male paralyzed HENT: [place], AT, ETT in place Eyes: EOMI, no scleral icterus Respiratory: Rhonchi bilaterally, worse on right.  No crackles, wheezing or rales Cardiovascular: tachycardi, regular rhythm, No M,G,R GI: BS+, distended, soft.  Extremities: extremities cool to touch, dry Neuro: [facility] Problem list     Assessment & Plan:   Acute encephalopathy secondary respiratory arrest Acute hypoxemic respiratory failure secondary to suspected aspiration, post-obstructive pna Chest xray 9/10 with worsening bibasilar airspace opacities, right>left. Eveng blood gas with improvement in acidosis, but PO2 of 67. --Intubated. S/p ROC --Full vent support --Repeat ABG this morning --Vanc/Cefepime day 2, narrow per cultures --Follow blood and resp cx --PAD protocol for RASS goal -1 --VAP --CT head STAT  Severe sepsis secondary unknown cause, post-obstructive pna? Septic Shock Leukocytosis stable. Respiratory and blood cultures sent. Continues to require pressors, central line confirmed by xray 9/10.  --F/u cultures --Antibiotics as above --Echocardiogram pending --Trend LA --Wean pressors as able, MAP goal over 65  Anion gap metabolic acidosis 2/2 sepsis Bicarb 18 this am with AG of 14. Continue treatment for sepsis as per above.   AKI 2/2 septic shock Baseline Cr under 1.0 prior to respiratory arrest and shock.  Cr 1.7 and BUN 36 this am. Continue vasopressors, echo pending. Bladder pressures elevated, if lactic acid continue sto rise, consult surgery. UO overnight around 300ccs.  -trend Cr -UA ordered -Bladder scan, foley  placed overnight -Strict I/O -Continue sepsis treatment as per above  Hyponatremia Likely hypovolemic with poor PO intake and severe illness -Daily bmp  Metastatic cancer with suspected lung primary Exudative pleural effusion EBUS 9/8 --F/u cytology  Abdominal pain/Constipation CT A/P with no obstruction or source of infection. Pain possibly secondary to metastatic disease. Continue NG tube placement. Start TF when able.  --NGT to LIWS --Bowel regimen  Best Practice (right click and "Reselect all SmartList Selections" daily)   Diet/type: NPO DVT prophylaxis: not indicated had hemoptysis GI prophylaxis: PPI Lines: Central line, a line Foley:  placed 11/03/21 Code Status:  full code Last date of multidisciplinary goals of care discussion [N/A] - Updated sister on telephone  Labs   CBC: Recent Labs  Lab 10/28/2021 0329 11/02/21 1516 11/03/21 0249 11/03/21 1022 11/03/21 1236 11/03/21 2251 11/04/21 0539  WBC 34.2* 37.5* 39.3*  --  34.3*  --  37.0*  NEUTROABS  --  34.5* 30.7*  --   --   --   --   HGB 12.7* 13.3 13.9 14.3 13.9 12.2* 12.6*  HCT 36.5* 38.1* 39.7 42.0 43.0 36.0* 36.5*  MCV 91.9 91.6 91.9  --  97.9  --  93.1  PLT 347 383 413*  --  358  --  278     Basic Metabolic Panel: Recent Labs  Lab 11/02/2021 1700 11/21/2021 0329 11/02/21 1516 11/03/21 0249 11/03/21 1022 11/03/21 1236 11/03/21 2251  NA 131* 132* 134* 134* 135 137 131*  K 3.6 3.7 3.6 4.8 4.2 5.3* 4.7  CL 99 102 107 101  --  104  --   CO2 21* 20* 20* 20*  --  18*  --   GLUCOSE 98 113* 132* 187*  --  144*  --   BUN 18 16 16 22   --  27*  --   CREATININE 0.96 0.92 0.70 1.00  --  1.26*  --   CALCIUM 8.1* 8.0* 8.0* 9.0  --  8.5*  --   MG  --  1.6*  --  2.1  --   --   --   PHOS  --  3.5  --   --   --   --   --     GFR: Estimated Creatinine Clearance: 46.8 mL/min (A) (by C-G formula based on SCr of 1.26 mg/dL (H)). Recent Labs  Lab 11/02/21 1516 11/03/21 0249 11/03/21 1236 11/03/21 1821  11/03/21 2324 11/04/21 0539  WBC 37.5* 39.3* 34.3*  --   --  37.0*  LATICACIDVEN  --   --  6.4* 4.9* 5.4*  --      Liver Function Tests: Recent Labs  Lab 10/26/2021 1700 10/29/2021 0329 11/03/21 0249 11/03/21 1236  AST 20 17 20 22   ALT 11 7 8 7   ALKPHOS 98 75 81 67  BILITOT 1.1 1.1 0.4 0.9  PROT 6.8 5.8* 6.0* 4.9*  ALBUMIN 2.7* 2.3* 2.3* 1.8*    No results for input(s): "LIPASE", "AMYLASE" in the last 168 hours. No results for input(s): "AMMONIA" in the last 168 hours.  ABG    Component Value Date/Time   PHART 7.334 (L) 11/03/2021 2251   PCO2ART 33.2 11/03/2021 2251   PO2ART 67 (L) 11/03/2021 2251   HCO3 17.7 (L) 11/03/2021 2251   TCO2 19 (L) 11/03/2021 2251  ACIDBASEDEF 7.0 (H) 11/03/2021 2251   O2SAT 92 11/03/2021 2251     Coagulation Profile: No results for input(s): "INR", "PROTIME" in the last 168 hours.  Cardiac Enzymes: No results for input(s): "CKTOTAL", "CKMB", "CKMBINDEX", "TROPONINI" in the last 168 hours.  HbA1C: No results found for: "HGBA1C"  CBG: Recent Labs  Lab 11/03/21 1132 11/03/21 1528 11/03/21 1914 11/03/21 2323 11/04/21 0310  GLUCAP 164* 130* 148* 161* 164*    Review of Systems:   Unable to obtain due to critical status  Past Medical History:  He,  has a past medical history of Allergy, Anxiety, Arthritis, Asthma, Atherosclerosis, Bipolar 1 disorder ([place]), Cataract, COPD (chronic obstructive pulmonary disease) ([place]), Depression, Elevated PSA, ETOH abuse, GERD (gastroesophageal reflux disease), Hyperlipidemia, Hypertension, and Right hip pain (04/13/2019).   Surgical History:   Past Surgical History:  Procedure Laterality Date   BACK SURGERY     lunbar   BRONCHIAL NEEDLE ASPIRATION BIOPSY  11/11/2021   Procedure: BRONCHIAL NEEDLE ASPIRATION BIOPSIES;  Surgeon: Candee Furbish, MD;  Location[facility] ENDOSCOPY;  Service: Pulmonary;;   COLONOSCOPY  12/21/09   OZH:YQMV papilla otherwise normal/ pancolonic diverticula/mutiple colonic  poylps   COLONOSCOPY WITH ESOPHAGOGASTRODUODENOSCOPY (EGD) N/A 10/20/2012   Procedure: COLONOSCOPY WITH ESOPHAGOGASTRODUODENOSCOPY (EGD);  Surgeon: Daneil Dolin, MD;  Location: [facility] ENDO SU[facility];  Service: Endoscopy;  Laterality: N/A;  10;15   CYSTOSCOPY N/A 01/31/2013   Procedure: CYSTOSCOPY FLEXIBLE;  Surgeon: Marissa Nestle, MD;  Location: [facility] ORS;  Service: Urology;  Laterality: N/A;  I would like to do this around 1 pm on monday.    HARDWARE REMOVAL Left 06/07/2013   Procedure: HARDWARE REMOVAL;  Surgeon: Mcarthur Rossetti, MD;  Location: [place];  Service: Orthopedics;  Laterality: Left;   INTRAMEDULLARY (IM) NAIL INTERTROCHANTERIC Right 04/14/2019   Procedure: INTRAMEDULLARY (IM) NAIL INTERTROCHANTRIC;  Surgeon: Leandrew Koyanagi, MD;  Location: [place];  Service: Orthopedics;  Laterality: Right;   IR THORACENTESIS ASP PLEURAL SPACE W/IMG GUIDE  11/18/2021   ORIF HIP FRACTURE Left 11/24/2012   Procedure: OPEN REDUCTION INTERNAL FIXATION HIP;  Surgeon: Sanjuana Kava, MD;  Location: [facility];  Service: Orthopedics;  Laterality: Left;   SPINE SURGERY     TOTAL HIP ARTHROPLASTY Left 06/07/2013   Procedure: REMOVE HARDWARE LEFT HIP AND LEFT TOTAL HIP ARTHROPLASTY ANTERIOR APPROACH;  Surgeon: Mcarthur Rossetti, MD;  Location: [place];  Service: Orthopedics;  Laterality: Left;   TRANSURETHRAL RESECTION OF PROSTATE N/A 03/22/2013   Procedure: TRANSURETHRAL RESECTION OF THE PROSTATE (TURP);  Surgeon: Marissa Nestle, MD;  Location: [facility];  Service: Urology;  Laterality: N/A;   VIDEO BRONCHOSCOPY WITH ENDOBRONCHIAL ULTRASOUND N/A 11/18/2021   Procedure: VIDEO BRONCHOSCOPY WITH ENDOBRONCHIAL ULTRASOUND;  Surgeon: Candee Furbish, MD;  Location: [facility] ENDOSCOPY;  Service: Pulmonary;  Laterality: N/A;     Social History:   reports that he has been smoking cigarettes. He has a 15.00 pack-year smoking history. He has never used smokeless tobacco. He reports that he does not currently use alcohol. He reports that he  does not currently use drugs after having used the following drugs: Marijuana and Cocaine.   Family History:  His family history includes Mental illness in his sister. There is no history of Colon cancer.   Allergies No Known Allergies   Home Medications  Prior to Admission medications   Medication Sig Start Date End Date Taking? Authorizing Provider  acetaminophen (TYLENOL) 325 MG tablet Take 650 mg by mouth every 6 (six) hours as  needed.   Yes [provider]  Carboxymethylcellul-Glycerin (CLEAR EYES FOR DRY EYES) 1-0.25 % SOLN Apply 1-2 drops to eye 2 (two) times daily as needed.   Yes [provider]  ibuprofen (ADVIL) 200 MG tablet Take 200 mg by mouth every 6 (six) hours as needed for moderate pain.   Yes [provider]  PROAIR HFA 108 (90 Base) MCG/ACT inhaler INHALE 2 PUFFS INTO THE LUNGS EVERY 4 TO 6 HOURS AS NEEDED. Patient taking differently: Inhale 2 puffs into the lungs every 4 (four) hours as needed for wheezing or shortness of breath. 08/28/16  Yes Susy Frizzle, MD  SYMBICORT 160-4.5 MCG/ACT inhaler Inhale 1 puff into the lungs 2 (two) times daily. 02/08/19  Yes [provider]  diclofenac Sodium (VOLTAREN) 1 % GEL Apply 2 g topically 4 (four) times daily. 11/04/20   Tacy Learn, PA-C  sertraline (ZOLOFT) 25 MG tablet Take 25 mg by mouth daily. 07/10/21   [provider]  tiZANidine (ZANAFLEX) 2 MG tablet Take 2 mg by mouth 2 (two) times daily as needed. 05/31/19   [provider]    [place]  Internal Medicine Resident PGY-3 [place]  Pager: [phone]

## 2021-11-04 NOTE — Consult Note (Signed)
[place] [birth year]  [id number].    Requesting MD: Dr. Riesa Pope Chief Complaint/Reason for Consult: Lactic Acidosis   HPI: Duane Richardson is a 66 y.o. male with hx of tobacco use, COPD, HTN, HLD who presented on 9/7 for cough, weight loss and 3d hx of hemoptysis. Imaging showed a large right pleural effusion with a 5.3 x 3.8 cm right upper lobe mass as well as several additional smaller pleural based nodules in the right upper lobe; a 6.2 x 2.5 cm pleural based mass in the posterior right lower lobe with a mass effect and compression of the right mainstem bronchus and bronchus intermedius with high-grade compression of the right middle and right lower lobe bronchi; and Hepatic, left adrenal, and left perirenal metastases. CT A/P also showed Gas-filled loops of colon may reflect adynamic ileus. He underwent R thoracentesis 9/8 by IR yielding 2L of bloody fluid with cytology currently pending from this. He underwent Flexible bronchoscopy and EBUS Bronchoscopy on 9/8 by Pulm with bx obtained. Cytology is also pending on this.   On 9/10 patient beacme hypoxic requiring intubation.  CCM took over as primary. There was concern for aspiration so he was started on abx.  He was oliguric and a Foley was placed.  Today we were called as patient had elevated lactic acidosis and concern for possible mesenteric ischemia. There was also concern for  abdominal compartment syndrome with elevated bladder pressures and lactic acidosis.  I did confirm with CCM that elevated bladder pressures were not taken when patient was chemically paralyzed. Rocuronium was only given for intubation yesterday. Patient is currently on fentanyl for sedation but will awaken and nod head yes/no to indicate if he has any pain.  When I was palpating his abdomen and asking if he had any tenderness, he shook his head no.  His abdomen was very soft.  He did have an OG tube but it did not appear to be functioning.  After I fixed  this he had good output. His lactic acid has downtrended from 5.4 > 4.5 > 4.0 after IVF resuscitation.  He is currently on levo and vaso with decreasing pressor requirements from this morning (levo 18-10mg/min this am, now 135DHR/CBU with systolic art line pressures in the 120's while I was in the room.  His acidosis has resolved on today's ABG as well. Staff report no bm, including no bloody bm's known during admission. No prior abdominal surgeries. He is not on blood thinners at baseline or currently.   ROS: ROS Limited 2/2 patient being intubated and sedated. As above.   Family History  Problem Relation Age of Onset   Mental illness Sister    Colon cancer Neg Hx     Past Medical History:  Diagnosis Date   Allergy    Anxiety    Arthritis    Asthma    Atherosclerosis    Bipolar 1 disorder (HYoung Harris    Cataract    COPD (chronic obstructive pulmonary disease) (HAneth    Depression    Elevated PSA    ETOH abuse    quit Oct 2013, then relapsed, as of Dec 2013 now has abstained X 1 month   GERD (gastroesophageal reflux disease)    Hyperlipidemia    Hypertension    Right hip pain 04/13/2019    Past Surgical History:  Procedure Laterality Date   BACK SURGERY     lunbar   BRONCHIAL NEEDLE ASPIRATION BIOPSY  11/05/2021   Procedure: BRONCHIAL  NEEDLE ASPIRATION BIOPSIES;  Surgeon: Candee Furbish, MD;  Location: [facility] ENDOSCOPY;  Service: Pulmonary;;   COLONOSCOPY  12/21/09   DUK:RCVK papilla otherwise normal/ pancolonic diverticula/mutiple colonic poylps   COLONOSCOPY WITH ESOPHAGOGASTRODUODENOSCOPY (EGD) N/A 10/20/2012   Procedure: COLONOSCOPY WITH ESOPHAGOGASTRODUODENOSCOPY (EGD);  Surgeon: Daneil Dolin, MD;  [facility] ENDO [facility];  Service: Endoscopy;  Laterality: N/A;  10;15   CYSTOSCOPY N/A 01/31/2013   Procedure: CYSTOSCOPY FLEXIBLE;  Surgeon: Marissa Nestle, MD;  Location: [facility] ORS;  Service: Urology;  Laterality: N/A;  I would like to do this around 1 pm on monday.    HARDWARE  REMOVAL Left 06/07/2013   Procedure: HARDWARE REMOVAL;  Surgeon: Mcarthur Rossetti, MD;  Location: [place];  Service: Orthopedics;  Laterality: Left;   INTRAMEDULLARY (IM) NAIL INTERTROCHANTERIC Right 04/14/2019   Procedure: INTRAMEDULLARY (IM) NAIL INTERTROCHANTRIC;  Surgeon: Leandrew Koyanagi, MD;  Location: [facility];  Service: Orthopedics;  Laterality: Right;   IR THORACENTESIS ASP PLEURAL SPACE W/IMG GUIDE  11/07/2021   ORIF HIP FRACTURE Left 11/24/2012   Procedure: OPEN REDUCTION INTERNAL FIXATION HIP;  Surgeon: Sanjuana Kava, MD;  Location: [facility] ORS;  Service: Orthopedics;  Laterality: Left;   SPINE SURGERY     TOTAL HIP ARTHROPLASTY Left 06/07/2013   Procedure: REMOVE HARDWARE LEFT HIP AND LEFT TOTAL HIP ARTHROPLASTY ANTERIOR APPROACH;  Surgeon: Mcarthur Rossetti, MD;  Location: [place];  Service: Orthopedics;  Laterality: Left;   TRANSURETHRAL RESECTION OF PROSTATE N/A 03/22/2013   Procedure: TRANSURETHRAL RESECTION OF THE PROSTATE (TURP);  Surgeon: Marissa Nestle, MD;  Location[facility];  Service: Urology;  Laterality: N/A;   VIDEO BRONCHOSCOPY WITH ENDOBRONCHIAL ULTRASOUND N/A 11/21/2021   Procedure: VIDEO BRONCHOSCOPY WITH ENDOBRONCHIAL ULTRASOUND;  Surgeon: Candee Furbish, MD;  Location: [facility] ENDOSCOPY;  Service: Pulmonary;  Laterality: N/A;    Social History:  reports that he has been smoking cigarettes. He has a 15.00 pack-year smoking history. He has never used smokeless tobacco. He reports that he does not currently use alcohol. He reports that he does not currently use drugs after having used the following drugs: Marijuana and Cocaine.  Allergies: No Known Allergies  Medications Prior to Admission  Medication Sig Dispense Refill   acetaminophen (TYLENOL) 325 MG tablet Take 650 mg by mouth every 6 (six) hours as needed.     Carboxymethylcellul-Glycerin (CLEAR EYES FOR DRY EYES) 1-0.25 % SOLN Apply 1-2 drops to eye 2 (two) times daily as needed.     ibuprofen (ADVIL) 200 MG tablet Take  200 mg by mouth every 6 (six) hours as needed for moderate pain.     PROAIR HFA 108 (90 Base) MCG/ACT inhaler INHALE 2 PUFFS INTO THE LUNGS EVERY 4 TO 6 HOURS AS NEEDED. (Patient taking differently: Inhale 2 puffs into the lungs every 4 (four) hours as needed for wheezing or shortness of breath.) 8.5 g 3   SYMBICORT 160-4.5 MCG/ACT inhaler Inhale 1 puff into the lungs 2 (two) times daily.     diclofenac Sodium (VOLTAREN) 1 % GEL Apply 2 g topically 4 (four) times daily. 100 g 0   sertraline (ZOLOFT) 25 MG tablet Take 25 mg by mouth daily.     tiZANidine (ZANAFLEX) 2 MG tablet Take 2 mg by mouth 2 (two) times daily as needed.     Vent Mode: PRVC FiO2 (%):  [100 %] 100 % Set Rate:  [28 bmp-30 bmp] 28 bmp Vt Set:  [640 mL] 640 mL PEEP:  [5 cmH20-8 cmH20] 8  cmH20 Plateau Pressure:  [24 cmH20-26 cmH20] 26 cmH20   Physical Exam: Blood pressure 93/71, pulse (!) 103, temperature (!) 101.6 F (38.7 C), temperature source Axillary, resp. rate (!) 30, height _0  (1.88 m), weight 58.5 kg, SpO2 99 %. General: Frail appearing male who is laying in bed on vent HEENT: head is normocephalic, atraumatic.  Sclera are noninjected.  Ears and nose without any masses or lesions.  Mouth is pink and moist. Dentition fair. ET in place. OGT in place on LIWS and now appears to be working.  Heart: Tachycardic with regular rhythm.   Lungs: Slightly decreased on R compared to L. Ronchi noted b/l. On vent. Settings as above.  Abd: His abdomen appears to have some distension but is very soft.  When I was palpating his abdomen and asking if he had any tenderness, he shook his head no. No rigidity or guarding. Hypoactive bowel sounds. No palpable masses, hernias, or organomegaly MS: no BUE/BLE edema  Results for orders placed or performed during the hospital encounter of 11/17/2021 (from the past 48 hour(s))  CBC with Differential/Platelet     Status: Abnormal   Collection Time: 11/02/21  3:16 PM  Result Value Ref Range    WBC 37.5 (H) 4.0 - 10.5 K/uL   RBC 4.16 (L) 4.22 - 5.81 MIL/uL   Hemoglobin 13.3 13.0 - 17.0 g/dL   HCT 38.1 (L) 39.0 - 52.0 %   MCV 91.6 80.0 - 100.0 fL   MCH 32.0 26.0 - 34.0 pg   MCHC 34.9 30.0 - 36.0 g/dL   RDW 13.2 11.5 - 15.5 %   Platelets 383 150 - 400 K/uL   nRBC 0.0 0.0 - 0.2 %   Neutrophils Relative % 92 %   Neutro Abs 34.5 (H) 1.7 - 7.7 K/uL   Lymphocytes Relative 0 %   Lymphs Abs 0.0 (L) 0.7 - 4.0 K/uL   Monocytes Relative 7 %   Monocytes Absolute 2.6 (H) 0.1 - 1.0 K/uL   Eosinophils Relative 1 %   Eosinophils Absolute 0.4 0.0 - 0.5 K/uL   Basophils Relative 0 %   Basophils Absolute 0.0 0.0 - 0.1 K/uL   WBC Morphology VACUOLATED NEUTROPHILS    RBC Morphology MORPHOLOGY UNREMARKABLE    Smear Review MORPHOLOGY UNREMARKABLE    Abs Immature Granulocytes 0.00 0.00 - 0.07 K/uL    Comment: Performed at [facility], [address], [place], [place] [address]  Basic metabolic panel     Status: Abnormal   Collection Time: 11/02/21  3:16 PM  Result Value Ref Range   Sodium 134 (L) 135 - 145 mmol/L   Potassium 3.6 3.5 - 5.1 mmol/L   Chloride 107 98 - 111 mmol/L   CO2 20 (L) 22 - 32 mmol/L   Glucose, Bld 132 (H) 70 - 99 mg/dL    Comment: Glucose reference range applies only to samples taken after fasting for at least 8 hours.   BUN 16 8 - 23 mg/dL   Creatinine, Ser 0.70 0.61 - 1.24 mg/dL   Calcium 8.0 (L) 8.9 - 10.3 mg/dL   GFR, Estimated >60 >60 mL/min    Comment: (NOTE) Calculated using the CKD-EPI Creatinine Equation (2021)    Anion gap 7 5 - 15    Comment: Performed at [facility] [address], [place], [place] [address]  CBC with Differential/Platelet     Status: Abnormal   Collection Time: 11/03/21  2:49 AM  Result Value Ref Range  WBC 39.3 (H) 4.0 - 10.5 K/uL   RBC 4.32 4.22 - 5.81 MIL/uL   Hemoglobin 13.9 13.0 - 17.0 g/dL   HCT 39.7 39.0 - 52.0 %   MCV 91.9 80.0 - 100.0 fL   MCH 32.2 26.0 - 34.0 pg   MCHC 35.0 30.0 - 36.0 g/dL   RDW 13.3  11.5 - 15.5 %   Platelets 413 (H) 150 - 400 K/uL   nRBC 0.0 0.0 - 0.2 %   Neutrophils Relative % 82 %   Neutro Abs 30.7 (H) 1.7 - 7.7 K/uL   Lymphocytes Relative 3 %   Lymphs Abs 1.0 0.7 - 4.0 K/uL   Monocytes Relative 13 %   Monocytes Absolute 5.0 (H) 0.1 - 1.0 K/uL   Eosinophils Relative 0 %   Eosinophils Absolute 0.1 0.0 - 0.5 K/uL   Basophils Relative 0 %   Basophils Absolute 0.2 (H) 0.0 - 0.1 K/uL   WBC Morphology MORPHOLOGY UNREMARKABLE    RBC Morphology See Note    Smear Review Normal platelet morphology    Immature Granulocytes 2 %   Abs Immature Granulocytes 0.88 (H) 0.00 - 0.07 K/uL   Polychromasia PRESENT     Comment: Performed at [facility], [address] [place], [place] [address]  Magnesium     Status: None   Collection Time: 11/03/21  2:49 AM  Result Value Ref Range   Magnesium 2.1 1.7 - 2.4 mg/dL    Comment: Performed at [facility] [address] [place], [place] [address]  Comprehensive metabolic panel     Status: Abnormal   Collection Time: 11/03/21  2:49 AM  Result Value Ref Range   Sodium 134 (L) 135 - 145 mmol/L   Potassium 4.8 3.5 - 5.1 mmol/L   Chloride 101 98 - 111 mmol/L   CO2 20 (L) 22 - 32 mmol/L   Glucose, Bld 187 (H) 70 - 99 mg/dL    Comment: Glucose reference range applies only to samples taken after fasting for at least 8 hours.   BUN 22 8 - 23 mg/dL   Creatinine, Ser 1.00 0.61 - 1.24 mg/dL   Calcium 9.0 8.9 - 10.3 mg/dL   Total Protein 6.0 (L) 6.5 - 8.1 g/dL   Albumin 2.3 (L) 3.5 - 5.0 g/dL   AST 20 15 - 41 U/L   ALT 8 0 - 44 U/L   Alkaline Phosphatase 81 38 - 126 U/L   Total Bilirubin 0.4 0.3 - 1.2 mg/dL   GFR, Estimated >60 >60 mL/min    Comment: (NOTE) Calculated using the CKD-EPI Creatinine Equation (2021)    Anion gap 13 5 - 15    Comment: Performed at [facility], [place] [address], [place], [place] [address]  MRSA Next Gen by PCR, Nasal     Status: None   Collection Time: 11/03/21  9:25 AM   Specimen:  Nasal Mucosa; Nasal Swab  Result Value Ref Range   MRSA by PCR Next Gen NOT DETECTED NOT DETECTED    Comment: (NOTE) The GeneXpert MRSA Assay (FDA approved for NASAL specimens only), is one component of a comprehensive MRSA colonization surveillance program. It is not intended to diagnose MRSA infection nor to guide or monitor treatment for MRSA infections. Test performance is not FDA approved in patients less than 73 years old. Performed at [facility], [place] [address] [place], [place] [address]   I-STAT 7, (LYTES, BLD GAS, ICA, H+H)  Status: Abnormal   Collection Time: 11/03/21 10:22 AM  Result Value Ref Range   pH, Arterial 7.199 (LL) 7.35 - 7.45   pCO2 arterial 72.3 (HH) 32 - 48 mmHg   pO2, Arterial 127 (H) 83 - 108 mmHg   Bicarbonate 28.2 (H) 20.0 - 28.0 mmol/L   TCO2 30 22 - 32 mmol/L   O2 Saturation 98 %   Acid-base deficit 2.0 0.0 - 2.0 mmol/L   Sodium 135 135 - 145 mmol/L   Potassium 4.2 3.5 - 5.1 mmol/L   Calcium, Ion 1.26 1.15 - 1.40 mmol/L   HCT 42.0 39.0 - 52.0 %   Hemoglobin 14.3 13.0 - 17.0 g/dL   Collection site RADIAL, ALLEN'S TEST ACCEPTABLE    Drawn by RT    Sample type ARTERIAL    Comment NOTIFIED PHYSICIAN   Glucose, capillary     Status: Abnormal   Collection Time: 11/03/21 11:32 AM  Result Value Ref Range   Glucose-Capillary 164 (H) 70 - 99 mg/dL    Comment: Glucose reference range applies only to samples taken after fasting for at least 8 hours.  Culture, blood (Routine X 2) w Reflex to ID Panel     Status: None (Preliminary result)   Collection Time: 11/03/21 11:43 AM   Specimen: BLOOD LEFT HAND  Result Value Ref Range   Specimen Description BLOOD LEFT HAND    Special Requests      BOTTLES DRAWN AEROBIC ONLY Blood Culture results may not be optimal due to an inadequate volume of blood received in culture bottles   Culture      NO GROWTH < 24 HOURS Performed at [facility] [address], [place], [place] [address]    Report  Status PENDING   Lactic acid, plasma     Status: Abnormal   Collection Time: 11/03/21 12:36 PM  Result Value Ref Range   Lactic Acid, Venous 6.4 (HH) 0.5 - 1.9 mmol/L    Comment: CRITICAL RESULT CALLED TO, READ BACK BY AND VERIFIED WITH L,PZITZUN RN _0  11/03/21 E,BENTON Performed at [facility], [place] [address] [place], [place] [address]   CBC     Status: Abnormal   Collection Time: 11/03/21 12:36 PM  Result Value Ref Range   WBC 34.3 (H) 4.0 - 10.5 K/uL   RBC 4.39 4.22 - 5.81 MIL/uL   Hemoglobin 13.9 13.0 - 17.0 g/dL   HCT 43.0 39.0 - 52.0 %   MCV 97.9 80.0 - 100.0 fL   MCH 31.7 26.0 - 34.0 pg   MCHC 32.3 30.0 - 36.0 g/dL   RDW 13.2 11.5 - 15.5 %   Platelets 358 150 - 400 K/uL   nRBC 0.0 0.0 - 0.2 %    Comment: Performed at [facility], [place] [address] [place], [place] [address]  Comprehensive metabolic panel     Status: Abnormal   Collection Time: 11/03/21 12:36 PM  Result Value Ref Range   Sodium 137 135 - 145 mmol/L   Potassium 5.3 (H) 3.5 - 5.1 mmol/L   Chloride 104 98 - 111 mmol/L   CO2 18 (L) 22 - 32 mmol/L   Glucose, Bld 144 (H) 70 - 99 mg/dL    Comment: Glucose reference range applies only to samples taken after fasting for at least 8 hours.   BUN 27 (H) 8 - 23 mg/dL   Creatinine, Ser 1.26 (H) 0.61 - 1.24 mg/dL   Calcium 8.5 (L) 8.9 - 10.3 mg/dL  Total Protein 4.9 (L) 6.5 - 8.1 g/dL   Albumin 1.8 (L) 3.5 - 5.0 g/dL   AST 22 15 - 41 U/L   ALT 7 0 - 44 U/L   Alkaline Phosphatase 67 38 - 126 U/L   Total Bilirubin 0.9 0.3 - 1.2 mg/dL   GFR, Estimated >60 >60 mL/min    Comment: (NOTE) Calculated using the CKD-EPI Creatinine Equation (2021)    Anion gap 15 5 - 15    Comment: Performed at [facility] [address], [place], [place] [address]  Culture, blood (Routine X 2) w Reflex to ID Panel     Status: None (Preliminary result)   Collection Time: 11/03/21 12:40 PM   Specimen: BLOOD LEFT HAND  Result Value Ref Range   Specimen Description  BLOOD LEFT HAND    Special Requests IN PEDIATRIC BOTTLE Blood Culture adequate volume    Culture      NO GROWTH < 24 HOURS Performed at [facility], [place] [address] [place], [place] [address]    Report Status PENDING   Glucose, capillary     Status: Abnormal   Collection Time: 11/03/21  3:28 PM  Result Value Ref Range   Glucose-Capillary 130 (H) 70 - 99 mg/dL    Comment: Glucose reference range applies only to samples taken after fasting for at least 8 hours.  Lactic acid, plasma     Status: Abnormal   Collection Time: 11/03/21  6:21 PM  Result Value Ref Range   Lactic Acid, Venous 4.9 (HH) 0.5 - 1.9 mmol/L    Comment: CRITICAL VALUE NOTED. VALUE IS CONSISTENT WITH PREVIOUSLY REPORTED/CALLED VALUE Performed at [facility], [place] [address], [place], [place] [address]   Glucose, capillary     Status: Abnormal   Collection Time: 11/03/21  7:14 PM  Result Value Ref Range   Glucose-Capillary 148 (H) 70 - 99 mg/dL    Comment: Glucose reference range applies only to samples taken after fasting for at least 8 hours.  I-STAT 7, (LYTES, BLD GAS, ICA, H+H)     Status: Abnormal   Collection Time: 11/03/21 10:51 PM  Result Value Ref Range   pH, Arterial 7.334 (L) 7.35 - 7.45   pCO2 arterial 33.2 32 - 48 mmHg   pO2, Arterial 67 (L) 83 - 108 mmHg   Bicarbonate 17.7 (L) 20.0 - 28.0 mmol/L   TCO2 19 (L) 22 - 32 mmol/L   O2 Saturation 92 %   Acid-base deficit 7.0 (H) 0.0 - 2.0 mmol/L   Sodium 131 (L) 135 - 145 mmol/L   Potassium 4.7 3.5 - 5.1 mmol/L   Calcium, Ion 1.10 (L) 1.15 - 1.40 mmol/L   HCT 36.0 (L) 39.0 - 52.0 %   Hemoglobin 12.2 (L) 13.0 - 17.0 g/dL   Patient temperature 98.5 F    Collection site Magazine features editor by [employer]    Sample type ARTERIAL   Glucose, capillary     Status: Abnormal   Collection Time: 11/03/21 11:23 PM  Result Value Ref Range   Glucose-Capillary 161 (H) 70 - 99 mg/dL    Comment: Glucose reference range applies only to samples taken after fasting  for at least 8 hours.  Lactic acid, plasma     Status: Abnormal   Collection Time: 11/03/21 11:24 PM  Result Value Ref Range   Lactic Acid, Venous 5.4 (HH) 0.5 - 1.9 mmol/L    Comment: CRITICAL VALUE NOTED. VALUE IS  CONSISTENT WITH PREVIOUSLY REPORTED/CALLED VALUE Performed at [facility], [place] [address] [place], [place] [address]   Glucose, capillary     Status: Abnormal   Collection Time: 11/04/21  3:10 AM  Result Value Ref Range   Glucose-Capillary 164 (H) 70 - 99 mg/dL    Comment: Glucose reference range applies only to samples taken after fasting for at least 8 hours.  Lactic acid, plasma     Status: Abnormal   Collection Time: 11/04/21  5:39 AM  Result Value Ref Range   Lactic Acid, Venous 4.5 (HH) 0.5 - 1.9 mmol/L    Comment: CRITICAL VALUE NOTED.  VALUE IS CONSISTENT WITH PREVIOUSLY REPORTED AND CALLED VALUE. Performed at [facility], [place] [address], [place], [place] [address]   Basic metabolic panel     Status: Abnormal   Collection Time: 11/04/21  5:39 AM  Result Value Ref Range   Sodium 133 (L) 135 - 145 mmol/L   Potassium 4.9 3.5 - 5.1 mmol/L   Chloride 101 98 - 111 mmol/L   CO2 18 (L) 22 - 32 mmol/L   Glucose, Bld 155 (H) 70 - 99 mg/dL    Comment: Glucose reference range applies only to samples taken after fasting for at least 8 hours.   BUN 36 (H) 8 - 23 mg/dL   Creatinine, Ser 1.77 (H) 0.61 - 1.24 mg/dL   Calcium 7.6 (L) 8.9 - 10.3 mg/dL   GFR, Estimated 41 (L) >60 mL/min    Comment: (NOTE) Calculated using the CKD-EPI Creatinine Equation (2021)    Anion gap 14 5 - 15    Comment: Performed at [facility] [address] [place], [place] [address]  CBC     Status: Abnormal   Collection Time: 11/04/21  5:39 AM  Result Value Ref Range   WBC 37.0 (H) 4.0 - 10.5 K/uL   RBC 3.92 (L) 4.22 - 5.81 MIL/uL   Hemoglobin 12.6 (L) 13.0 - 17.0 g/dL   HCT 36.5 (L) 39.0 - 52.0 %   MCV 93.1 80.0 - 100.0 fL   MCH 32.1 26.0 - 34.0 pg   MCHC 34.5 30.0 -  36.0 g/dL   RDW 13.4 11.5 - 15.5 %   Platelets 278 150 - 400 K/uL   nRBC 0.0 0.0 - 0.2 %    Comment: Performed at [facility], [place] [address], [place], [place] [address]  Magnesium     Status: None   Collection Time: 11/04/21  5:39 AM  Result Value Ref Range   Magnesium 1.7 1.7 - 2.4 mg/dL    Comment: Performed at [facility] [address] [place], [place] [address]  Urinalysis, Complete w Microscopic Urine, Catheterized     Status: Abnormal   Collection Time: 11/04/21  6:36 AM  Result Value Ref Range   Color, Urine YELLOW YELLOW   APPearance TURBID (A) CLEAR   Specific Gravity, Urine 1.018 1.005 - 1.030   pH 5.0 5.0 - 8.0   Glucose, UA NEGATIVE NEGATIVE mg/dL   Hgb urine dipstick LARGE (A) NEGATIVE   Bilirubin Urine NEGATIVE NEGATIVE   Ketones, ur NEGATIVE NEGATIVE mg/dL   Protein, ur 100 (A) NEGATIVE mg/dL   Nitrite NEGATIVE NEGATIVE   Leukocytes,Ua LARGE (A) NEGATIVE   RBC / HPF >50 (H) 0 - 5 RBC/hpf   WBC, UA >50 (H) 0 - 5 WBC/hpf   Bacteria, UA MANY (A) NONE SEEN   Squamous Epithelial / LPF 0-5  0 - 5   WBC Clumps PRESENT    Mucus PRESENT    Hyaline Casts, UA PRESENT    Non Squamous Epithelial 0-5 (A) NONE SEEN    Comment: Performed at [facility], [place] [address] [place], [place] [address]  I-STAT 7, (LYTES, BLD GAS, ICA, H+H)     Status: Abnormal   Collection Time: 11/04/21  8:10 AM  Result Value Ref Range   pH, Arterial 7.408 7.35 - 7.45   pCO2 arterial 31.5 (L) 32 - 48 mmHg   pO2, Arterial 69 (L) 83 - 108 mmHg   Bicarbonate 19.9 (L) 20.0 - 28.0 mmol/L   TCO2 21 (L) 22 - 32 mmol/L   O2 Saturation 94 %   Acid-base deficit 4.0 (H) 0.0 - 2.0 mmol/L   Sodium 134 (L) 135 - 145 mmol/L   Potassium 4.9 3.5 - 5.1 mmol/L   Calcium, Ion 1.04 (L) 1.15 - 1.40 mmol/L   HCT 36.0 (L) 39.0 - 52.0 %   Hemoglobin 12.2 (L) 13.0 - 17.0 g/dL   Sample type ARTERIAL   Glucose, capillary     Status: Abnormal   Collection Time: 11/04/21  9:00 AM  Result Value  Ref Range   Glucose-Capillary 113 (H) 70 - 99 mg/dL    Comment: Glucose reference range applies only to samples taken after fasting for at least 8 hours.   Comment 1 Notify RN    Comment 2 Document in Chart   Lactic acid, plasma     Status: Abnormal   Collection Time: 11/04/21 10:15 AM  Result Value Ref Range   Lactic Acid, Venous 4.0 (HH) 0.5 - 1.9 mmol/L    Comment: CRITICAL VALUE NOTED. VALUE IS CONSISTENT WITH PREVIOUSLY REPORTED/CALLED VALUE Performed at [facility], [place] [address] [place], [place] [address]   Glucose, capillary     Status: Abnormal   Collection Time: 11/04/21 11:44 AM  Result Value Ref Range   Glucose-Capillary 142 (H) 70 - 99 mg/dL    Comment: Glucose reference range applies only to samples taken after fasting for at least 8 hours.   Comment 1 Notify RN    Comment 2 Document in Chart    ECHOCARDIOGRAM COMPLETE  Result Date: 11/04/2021    ECHOCARDIOGRAM REPORT   Patient Name:   MIKAH POSS Date of Exam: 11/04/2021 Medical Rec #:  [id number]     Height:       74.0 in Accession #:    [id number]    Weight:       129.0 lb Date of Birth:  [birth year]     BSA:          1.804 m Patient Age:    67 years      BP:           111/87 mmHg Patient Gender: M             HR:           103 bpm. Exam Location:  Inpatient Procedure: 2D Echo and Intracardiac Opacification Agent Indications:    chest pain  History:        Patient has no prior history of Echocardiogram examinations.                 Signs/Symptoms:Fatigue, Chest Pain, Dyspnea and Edema; Risk                 Factors:Current Smoker, COPD and Hypertension.  Sonographer:    Harvie Junior Referring  Phys: [id number] CHI JANE ELLISON  Sonographer Comments: Technically difficult study due to poor echo windows, no parasternal window, no apical window, no subcostal window and echo performed with patient supine and on artificial respirator. Image acquisition challenging due to COPD and Image acquisition challenging due to respiratory  motion. IMPRESSIONS  1. Study is technically difficult: Poor LV asessment.  2. There is hyperdynamic apical RV function with relative decrease in the RV mid wall function (McConnell's Sign). This is suggestive of RV strain, but can be seen in PE.  3. Left ventricular ejection fraction, by estimation, is 55 to 60%. The left ventricle has normal function. The left ventricle has no regional wall motion abnormalities. There is moderate asymmetric left ventricular hypertrophy of the basal-septal segment. Left ventricular diastolic parameters are consistent with Grade I diastolic dysfunction (impaired relaxation).  4. Right ventricular systolic function is hyperdynamic. The right ventricular size is normal. There is normal pulmonary artery systolic pressure. The estimated right ventricular systolic pressure is 85.4 mmHg.  5. A small pericardial effusion is present. The pericardial effusion is surrounding the apex. There is no evidence of cardiac tamponade.  6. The mitral valve was not well visualized. No evidence of mitral valve regurgitation. No evidence of mitral stenosis. Moderate mitral annular calcification.  7. The aortic valve was not well visualized. There is moderate calcification of the aortic valve. Aortic valve regurgitation is not visualized. Aortic valve sclerosis is present, with no evidence of aortic valve stenosis.  8. Aortic dilatation noted. There is mild dilatation of the aortic root, measuring 40 mm. Comparison(s): No prior Echocardiogram. FINDINGS  Left Ventricle: Left ventricular ejection fraction, by estimation, is 55 to 60%. The left ventricle has normal function. The left ventricle has no regional wall motion abnormalities. Definity contrast agent was given IV to delineate the left ventricular  endocardial borders. The left ventricular internal cavity size was small. There is moderate asymmetric left ventricular hypertrophy of the basal-septal segment. Left ventricular diastolic parameters are  consistent with Grade I diastolic dysfunction (impaired relaxation). Right Ventricle: The right ventricular size is normal. No increase in right ventricular wall thickness. Right ventricular systolic function is hyperdynamic. There is normal pulmonary artery systolic pressure. The tricuspid regurgitant velocity is 2.48 m/s, and with an assumed right atrial pressure of 8 mmHg, the estimated right ventricular systolic pressure is 62.7 mmHg. Left Atrium: Left atrial size was not well visualized. Right Atrium: Right atrial size was not well visualized. Pericardium: A small pericardial effusion is present. The pericardial effusion is surrounding the apex. There is no evidence of cardiac tamponade. Mitral Valve: The mitral valve was not well visualized. Moderate mitral annular calcification. No evidence of mitral valve regurgitation. No evidence of mitral valve stenosis. Tricuspid Valve: The tricuspid valve is not well visualized. Tricuspid valve regurgitation is not demonstrated. No evidence of tricuspid stenosis. Aortic Valve: The aortic valve was not well visualized. There is moderate calcification of the aortic valve. Aortic valve regurgitation is not visualized. Aortic valve sclerosis is present, with no evidence of aortic valve stenosis. Aortic valve mean gradient measures 2.0 mmHg. Aortic valve peak gradient measures 4.6 mmHg. Aortic valve area, by VTI measures 5.55 cm. Pulmonic Valve: The pulmonic valve was not well visualized. Pulmonic valve regurgitation is not visualized. Aorta: Aortic dilatation noted. There is mild dilatation of the aortic root, measuring 40 mm. IAS/Shunts: The interatrial septum was not well visualized.  LEFT VENTRICLE PLAX 2D LVIDd:         3.40 cm  Diastology LVIDs:         2.50 cm   LV e' medial:    5.22 cm/s LV PW:         1.20 cm   LV E/e' medial:  9.4 LV IVS:        1.30 cm   LV e' lateral:   8.59 cm/s LVOT diam:     2.60 cm   LV E/e' lateral: 5.7 LV SV:         84 LV SV Index:   47  LVOT Area:     5.31 cm  RIGHT VENTRICLE RV Basal diam:  2.90 cm RV Mid diam:    2.60 cm RV S prime:     11.70 cm/s TAPSE (M-mode): 1.8 cm LEFT ATRIUM           Index       RIGHT ATRIUM          Index LA Vol (A4C): 13.0 ml 7.21 ml/m  RA Area:     8.52 cm                                   RA Volume:   15.80 ml 8.76 ml/m  AORTIC VALVE                    PULMONIC VALVE AV Area (Vmax):    4.41 cm     PV Vmax:       0.67 m/s AV Area (Vmean):   4.79 cm     PV Peak grad:  1.8 mmHg AV Area (VTI):     5.55 cm AV Vmax:           107.00 cm/s AV Vmean:          69.300 cm/s AV VTI:            0.152 m AV Peak Grad:      4.6 mmHg AV Mean Grad:      2.0 mmHg LVOT Vmax:         88.90 cm/s LVOT Vmean:        62.500 cm/s LVOT VTI:          0.159 m LVOT/AV VTI ratio: 1.05  AORTA Ao Root diam: 3.70 cm Ao Asc diam:  4.00 cm MITRAL VALVE               TRICUSPID VALVE MV Area (PHT): 6.02 cm    TR Peak grad:   24.6 mmHg MV Decel Time: 126 msec    TR Vmax:        248.00 cm/s MR Peak grad: 11.0 mmHg MR Vmax:      166.00 cm/s  SHUNTS MV E velocity: 49.30 cm/s  Systemic VTI:  0.16 m MV A velocity: 67.30 cm/s  Systemic Diam: 2.60 cm MV E/A ratio:  0.73 Rudean Haskell MD Electronically signed by Rudean Haskell MD Signature Date/Time: 11/04/2021/11:33:18 AM    Final    DG CHEST PORT 1 VIEW  Result Date: 11/04/2021 CLINICAL DATA:  Hypoxia, endotracheal tube present EXAM: PORTABLE CHEST 1 VIEW COMPARISON:  11/03/2021 FINDINGS: No significant change in AP portable chest radiograph. Endotracheal tube, esophagogastric tube, and left neck vascular catheter in unchanged, satisfactory position. Layering right greater than left pleural effusions with associated atelectasis or consolidation and mild, diffuse interstitial pulmonary opacity. No new or focal airspace opacity. IMPRESSION: 1. No significant change in AP portable chest  radiograph. 2. Layering right greater than left pleural effusions with associated atelectasis or  consolidation and mild, diffuse interstitial pulmonary opacity, consistent with edema or infection. 3. No new or focal airspace opacity. 4. Support apparatus unchanged, in satisfactory position. Electronically Signed   By: Delanna Ahmadi M.D.   On: 11/04/2021 09:15   DG CHEST PORT 1 VIEW  Result Date: 11/03/2021 CLINICAL DATA:  Central line placement EXAM: PORTABLE CHEST 1 VIEW COMPARISON:  11/03/2021 at 0955 hours FINDINGS: Interval placement of left IJ approach central venous catheter with distal tip at the level of the distal SVC. Endotracheal and enteric tubes remain appropriately positioned. Stable heart size. Persistent worsening right basilar opacity. Increasing left basilar opacity. No pneumothorax. IMPRESSION: 1. Interval placement of left IJ approach central venous catheter with distal tip at the level of the distal SVC. No pneumothorax. 2. Worsening bibasilar airspace opacities, right greater than left. Electronically Signed   By: Davina Poke D.O.   On: 11/03/2021 15:52   CT HEAD WO CONTRAST (5MM)  Result Date: 11/03/2021 CLINICAL DATA:  66 year old male with altered mental status. EXAM: CT HEAD WITHOUT CONTRAST TECHNIQUE: Contiguous axial images were obtained from the base of the skull through the vertex without intravenous contrast. RADIATION DOSE REDUCTION: This exam was performed according to the departmental dose-optimization program which includes automated exposure control, adjustment of the mA and/or kV according to patient size and/or use of iterative reconstruction technique. COMPARISON:  11/02/2021 MR and prior studies FINDINGS: Brain: No evidence of acute infarction, hemorrhage, hydrocephalus, extra-axial collection or mass lesion/mass effect. 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 Vascular: Carotid atherosclerotic calcifications are noted. Skull: Normal. Negative for fracture or focal lesion.  Sinuses/Orbits: No acute finding. Other: None. IMPRESSION: 1. No evidence of acute intracranial abnormality. 2. Atrophy, chronic small-vessel white matter ischemic changes and remote LEFT thalamic and white matter infarcts. Electronically Signed   By: Margarette Canada M.D.   On: 11/03/2021 12:29   DG CHEST PORT 1 VIEW  Result Date: 11/03/2021 CLINICAL DATA:  Coughing up blood 3 days before admission, not responsive at the moment. EXAM: PORTABLE CHEST 1 VIEW COMPARISON:  Chest x-ray dated 11/11/2021 FINDINGS: Endotracheal tube has been placed with tip well-positioned just above the level of the carina. Enteric tube passes below the diaphragm. Heart size and mediastinal contours are stable. Increased airspace opacities within the mid and lower lung zones on the RIGHT. Increased interstitial prominence within the LEFT lower lung. RIGHT pleural effusion, small to moderate in size, with extension to the RIGHT lung fissure. No pneumothorax is seen. IMPRESSION: 1. Increased airspace opacities within the mid and lower lung zones on the RIGHT. Given the history of hemoptysis, this could represent increasing pulmonary hemorrhage. Differential considerations also include multifocal pneumonia, aspiration and asymmetric pulmonary edema. 2. Increased interstitial prominence within the LEFT lower lung, most likely interstitial edema. 3. RIGHT pleural effusion, small to moderate in size. 4. Endotracheal tube well positioned with tip just above the level of the carina. Electronically Signed   By: Franki Cabot M.D.   On: 11/03/2021 10:29   DG Abd Portable 1V  Result Date: 11/03/2021 CLINICAL DATA:  Coughing up blood for 3 days before admission, not responsive now. EXAM: PORTABLE ABDOMEN - 1 VIEW COMPARISON:  Plain film of the abdomen dated 05/04/2019. CT abdomen dated 11/19/2021. FINDINGS: Prominent gas-filled loops of bowel throughout the abdomen and pelvis,: More prominent than the small bowel, compatible with the  appearance on  yesterday's CT abdomen and pelvis, compatible with ileus. No evidence of abnormal fluid collection or free intraperitoneal air. IMPRESSION: Prominent gas-filled loops of bowel throughout the abdomen and pelvis, compatible with the appearance on yesterday's CT abdomen and pelvis, compatible with ileus. Electronically Signed   By: Franki Cabot M.D.   On: 11/03/2021 10:27    Anti-infectives (From admission, onward)    Start     Dose/Rate Route Frequency Ordered Stop   11/05/21 1200  vancomycin (VANCOREADY) IVPB 1250 mg/250 mL        1,250 mg 166.7 mL/hr over 90 Minutes Intravenous Every 48 hours 11/04/21 0831     11/04/21 1200  vancomycin (VANCOREADY) IVPB 1250 mg/250 mL  Status:  Discontinued        1,250 mg 166.7 mL/hr over 90 Minutes Intravenous Every 24 hours 11/03/21 1151 11/03/21 1404   11/04/21 1200  vancomycin (VANCOCIN) IVPB 1000 mg/200 mL premix  Status:  Discontinued        1,000 mg 200 mL/hr over 60 Minutes Intravenous Every 24 hours 11/03/21 1404 11/04/21 0831   11/03/21 1415  ceFEPIme (MAXIPIME) 2 g in sodium chloride 0.9 % 100 mL IVPB        2 g 200 mL/hr over 30 Minutes Intravenous Every 12 hours 11/03/21 1327     11/03/21 1400  ceFEPIme (MAXIPIME) 2 g in sodium chloride 0.9 % 100 mL IVPB  Status:  Discontinued        2 g 200 mL/hr over 30 Minutes Intravenous Every 8 hours 11/03/21 0900 11/03/21 1327   11/03/21 1000  vancomycin (VANCOREADY) IVPB 1250 mg/250 mL        1,250 mg 166.7 mL/hr over 90 Minutes Intravenous  Once 11/03/21 0900 11/03/21 1234       Assessment/Plan Abdominal Distension Lactic Acidosis  Patient has been seen and examined.  Labs, imaging, vitals and available notes reviewed. 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  Cytology for R lung mass work-up is pending.  We were called today for concerns of possible mesenteric ischemia as well as  abdominal compartment syndrome with elevated bladder pressures and lactic acidosis.  It appears elevated bladder pressures were not taken when patient was chemically paralyzed.  Clinically he does not appear to have abdominal compartment syndrome. I also have a low suspicion for mesenteric ischemia at this time.  His abdomen is very soft.  He was able to awake on the vent and indicate he had no tenderness with palpation.  There is no rigidity or guarding.  His lactic acidosis is downtrending (5.4 > 4.5 > 4.0) after IVF resuscitation. He has had decreasing pressor requirements from this morning (levo 18-77mg/min this am, now 116m/min) with art line systolic pressure in the 12703'Jhile I was in the room.  His acidosis has resolved on today's ABG as well. No indication for any emergency surgery at this time. I do suspect he may have a degree of an ileus that is consistent with a CT earlier to admission.  Would recommend continuing OG tube to LIWS (I was able to get refunctioning while was in the room). I updated his RN's and CCM about above in person. Please let usKoreanow of any acute changes, otherwise we will plan to see again in the AM.   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 Possible Aspiration PNA - Getting covered with abx Acute Respiratory Failure - on Vent. Was on 100% and 8 PEEP while I was in the room Tobacco use COPD HTN HLD  MJillyn Ledger PN[facility] Center[facility] 11/04/2021, 11:58 AM Please see Amion for pager number during day hours 7:00am-4:30pm

## 2021-11-04 NOTE — Progress Notes (Signed)
Verified with MD, Dr. Loanne Drilling, based off recent chest x-ray, okay to use OG tube.   Duane Richardson

## 2021-11-04 NOTE — Progress Notes (Signed)
  Echocardiogram 2D Echocardiogram has been performed.  Lana Fish 11/04/2021, 10:21 AM

## 2021-11-04 NOTE — Progress Notes (Signed)
  Transition of Care [facility]) Screening Note   Patient Details  Name: Duane Richardson Date of Birth: 08-22-1955   Transition of Care [facility]) CM/SW Contact:    Tom-Johnson, Renea Ee, RN Phone Number: 11/04/2021, 2:51 PM  Patient is admitted for Pulmonary nodule with Hemoptysis. Currently intubated. Cardiovascular and Surgery following.  Transition of Care Department [facility]) has reviewed patient and no TOC needs or recommendations have been identified at this time. TOC will continue to monitor patient advancement through interdisciplinary progression rounds. If new patient transition needs arise, please place a TOC consult.

## 2021-11-04 NOTE — Progress Notes (Signed)
Nutrition Follow-up  DOCUMENTATION CODES:   Underweight  INTERVENTION:   When medically appropriate, recommend starting enteral nutrition via OG: Start Vital 1.2 at 25 mL/hr and advacne by 10 mL every 8 hours to goal rate of 65 mL/hr (1560 mL/day) Provides 1872 kcla, 117 gm of protein, and 1265 mL free water daily.   NUTRITION DIAGNOSIS:   Increased nutrient needs related to chronic illness as evidenced by estimated needs. - Ongoing   GOAL:   Patient will meet greater than or equal to 90% of their needs - Ongoing  MONITOR:   Weight trends, Labs, I & O's  REASON FOR ASSESSMENT:   Consult Assessment of nutrition requirement/status  ASSESSMENT:   66 y.o. male presented to the ED with coughing up blood. PMH includes COPD, tobacco use, HTN, and GERD. Pt admitted with new found lung mass.  9/08 - Thoracentesis, removed 2 L 9/10 - Respiratory arrest; Intubated 9/11 - Thoracentesis, removed 1.1 L  Per MD note, plan to hold tube feeds at this time due to ileus. OG to LIWS.   Patient is currently intubated on ventilator support. MV: 17.4 L/min Temp (24hrs), Avg:99.6 F (37.6 C), Min:98.3 F (36.8 C), Max:101.6 F (38.7 C) Continuous Medications:  Fentanyl Levophed Propofol: 1.76 ml/hr Vasopressin   Medications reviewed and include: Colace, Protonix, Miralax, IV antibiotics Labs reviewed: Sodium 134, BUN 36, Creatinine 1.77  OG output: 600 mL x 24 hours   Diet Order:   Diet Order             Diet NPO time specified  Diet effective now                   EDUCATION NEEDS:   Not appropriate for education at this time  Skin:  Skin Assessment: Reviewed RN Assessment  Last BM:  9/9  Height:   Ht Readings from Last 1 Encounters:  11/03/21 6\' 2"  (1.88 m)   Weight:   Wt Readings from Last 1 Encounters:  11/04/21 58.5 kg   Ideal Body Weight:  86.4 kg  BMI:  Body mass index is 16.56 kg/m.  Estimated Nutritional Needs:  Kcal:  1800-2000 Protein:   90-105 grams Fluid:  >/= 1.8 L   Hermina Barters RD, LDN Clinical Dietitian See [facility] for contact information.

## 2021-11-04 NOTE — Progress Notes (Signed)
Mr. Porr with continued vasopressor requirement and increase in PEEP to 10 with 100% FiO2 with PaO2 in upper 60's. Bedside US with evidence of right pleural effusion. Discussed with sister Kathaleen Maser who is agreeable to bedside thoracentesis. Bedside US guided thoracentesis performed with 1.1 L of bloody fluid removed from right pleural space. Tolerated procedure well and post-procedure xray with marked improvement in effusion without pneumothorax. FI02 decreased to 90%. Will repeat ABG tomorrow morning. Fluid sent for analysis.

## 2021-11-04 NOTE — Procedures (Signed)
Thoracentesis  Procedure Note  Neri Samek Kollmann  [id number]  06-23-1955  Date:11/04/21  Time:4:04 PM   Provider Performing:Mickelle Goupil Rodman Pickle, MD  Procedure: Thoracentesis with imaging guidance [id number])  Indication(s) Pleural Effusion  Consent Risks of the procedure as well as the alternatives and risks of each were explained to the patient and/or caregiver.  Consent for the procedure was obtained and is signed in the bedside chart  Anesthesia Topical only with 1% lidocaine    Time Out Verified patient identification, verified procedure, site/side was marked, verified correct patient position, special equipment/implants available, medications/allergies/relevant history reviewed, required imaging and test results available.   Sterile Technique Maximal sterile technique including full sterile barrier drape, hand hygiene, sterile gown, sterile gloves, mask, hair covering, sterile ultrasound probe cover (if used).  Procedure Description Ultrasound was used to identify appropriate pleural anatomy for placement and overlying skin marked.  Area of drainage cleaned and draped in sterile fashion. Lidocaine was used to anesthetize the skin and subcutaneous tissue.  1100 cc's of bloody appearing fluid was drained from the right pleural space. Catheter then removed and bandaid applied to site.      Complications/Tolerance None; patient tolerated the procedure well. Chest X-ray is ordered to confirm no post-procedural complication.   EBL Minimal   Specimen(s) Pleural fluid

## 2021-11-05 ENCOUNTER — Inpatient Hospital Stay (HOSPITAL_COMMUNITY): Payer: Medicare Other

## 2021-11-05 DIAGNOSIS — R4182 Altered mental status, unspecified: Secondary | ICD-10-CM

## 2021-11-05 DIAGNOSIS — R911 Solitary pulmonary nodule: Secondary | ICD-10-CM | POA: Diagnosis not present

## 2021-11-05 LAB — POCT I-STAT 7, (LYTES, BLD GAS, ICA,H+H)
Acid-base deficit: 3 mmol/L — ABNORMAL HIGH (ref 0.0–2.0)
Acid-base deficit: 3 mmol/L — ABNORMAL HIGH (ref 0.0–2.0)
Acid-base deficit: 4 mmol/L — ABNORMAL HIGH (ref 0.0–2.0)
Bicarbonate: 19.9 mmol/L — ABNORMAL LOW (ref 20.0–28.0)
Bicarbonate: 18.9 mmol/L — ABNORMAL LOW (ref 20.0–28.0)
Bicarbonate: 20 mmol/L (ref 20.0–28.0)
Calcium, Ion: 1.02 mmol/L — ABNORMAL LOW (ref 1.15–1.40)
Calcium, Ion: 1.02 mmol/L — ABNORMAL LOW (ref 1.15–1.40)
Calcium, Ion: 1.07 mmol/L — ABNORMAL LOW (ref 1.15–1.40)
HCT: 44 % (ref 39.0–52.0)
HCT: 30 % — ABNORMAL LOW (ref 39.0–52.0)
HCT: 41 % (ref 39.0–52.0)
Hemoglobin: 15 g/dL (ref 13.0–17.0)
Hemoglobin: 10.2 g/dL — ABNORMAL LOW (ref 13.0–17.0)
Hemoglobin: 13.9 g/dL (ref 13.0–17.0)
O2 Saturation: 100 %
O2 Saturation: 100 %
O2 Saturation: 99 %
Patient temperature: 99.7
Potassium: 3.9 mmol/L (ref 3.5–5.1)
Potassium: 3.9 mmol/L (ref 3.5–5.1)
Potassium: 3.8 mmol/L (ref 3.5–5.1)
Sodium: 134 mmol/L — ABNORMAL LOW (ref 135–145)
Sodium: 135 mmol/L (ref 135–145)
Sodium: 136 mmol/L (ref 135–145)
TCO2: 21 mmol/L — ABNORMAL LOW (ref 22–32)
TCO2: 20 mmol/L — ABNORMAL LOW (ref 22–32)
TCO2: 21 mmol/L — ABNORMAL LOW (ref 22–32)
pCO2 arterial: 29.7 mmHg — ABNORMAL LOW (ref 32–48)
pCO2 arterial: 25.1 mmHg — ABNORMAL LOW (ref 32–48)
pCO2 arterial: 31.6 mmHg — ABNORMAL LOW (ref 32–48)
pH, Arterial: 7.437 (ref 7.35–7.45)
pH, Arterial: 7.484 — ABNORMAL HIGH (ref 7.35–7.45)
pH, Arterial: 7.41 (ref 7.35–7.45)
pO2, Arterial: 276 mmHg — ABNORMAL HIGH (ref 83–108)
pO2, Arterial: 162 mmHg — ABNORMAL HIGH (ref 83–108)
pO2, Arterial: 134 mmHg — ABNORMAL HIGH (ref 83–108)

## 2021-11-05 LAB — GLUCOSE, CAPILLARY
Glucose-Capillary: 137 mg/dL — ABNORMAL HIGH (ref 70–99)
Glucose-Capillary: 133 mg/dL — ABNORMAL HIGH (ref 70–99)
Glucose-Capillary: 88 mg/dL (ref 70–99)
Glucose-Capillary: 81 mg/dL (ref 70–99)
Glucose-Capillary: 99 mg/dL (ref 70–99)
Glucose-Capillary: 87 mg/dL (ref 70–99)

## 2021-11-05 LAB — TECHNOLOGIST SMEAR REVIEW

## 2021-11-05 LAB — CBC WITH DIFFERENTIAL/PLATELET
Abs Immature Granulocytes: 1.8 10*3/uL — ABNORMAL HIGH (ref 0.00–0.07)
Basophils Absolute: 0.1 10*3/uL (ref 0.0–0.1)
Basophils Relative: 0 %
Eosinophils Absolute: 0.1 10*3/uL (ref 0.0–0.5)
Eosinophils Relative: 0 %
HCT: 29.6 % — ABNORMAL LOW (ref 39.0–52.0)
Hemoglobin: 10.4 g/dL — ABNORMAL LOW (ref 13.0–17.0)
Immature Granulocytes: 5 %
Lymphocytes Relative: 3 %
Lymphs Abs: 1.2 10*3/uL (ref 0.7–4.0)
MCH: 32 pg (ref 26.0–34.0)
MCHC: 35.1 g/dL (ref 30.0–36.0)
MCV: 91.1 fL (ref 80.0–100.0)
Monocytes Absolute: 4.9 10*3/uL — ABNORMAL HIGH (ref 0.1–1.0)
Monocytes Relative: 13 %
Neutro Abs: 31.1 10*3/uL — ABNORMAL HIGH (ref 1.7–7.7)
Neutrophils Relative %: 79 %
Platelets: 228 10*3/uL (ref 150–400)
RBC: 3.25 MIL/uL — ABNORMAL LOW (ref 4.22–5.81)
RDW: 13.5 % (ref 11.5–15.5)
Smear Review: NORMAL
WBC: 39.1 10*3/uL — ABNORMAL HIGH (ref 4.0–10.5)
nRBC: 0 % (ref 0.0–0.2)

## 2021-11-05 LAB — BASIC METABOLIC PANEL
Anion gap: 13 (ref 5–15)
BUN: 46 mg/dL — ABNORMAL HIGH (ref 8–23)
CO2: 18 mmol/L — ABNORMAL LOW (ref 22–32)
Calcium: 7.2 mg/dL — ABNORMAL LOW (ref 8.9–10.3)
Chloride: 104 mmol/L (ref 98–111)
Creatinine, Ser: 1.62 mg/dL — ABNORMAL HIGH (ref 0.61–1.24)
GFR, Estimated: 46 mL/min — ABNORMAL LOW (ref >60)
Glucose, Bld: 136 mg/dL — ABNORMAL HIGH (ref 70–99)
Potassium: 4.1 mmol/L (ref 3.5–5.1)
Sodium: 135 mmol/L (ref 135–145)

## 2021-11-05 LAB — LACTIC ACID, PLASMA
Lactic Acid, Venous: 3.1 mmol/L (ref 0.5–1.9)
Lactic Acid, Venous: 2.6 mmol/L (ref 0.5–1.9)
Lactic Acid, Venous: 2.4 mmol/L (ref 0.5–1.9)
Lactic Acid, Venous: 2.3 mmol/L (ref 0.5–1.9)
Lactic Acid, Venous: 2.2 mmol/L (ref 0.5–1.9)
Lactic Acid, Venous: 2.1 mmol/L (ref 0.5–1.9)

## 2021-11-05 LAB — PHOSPHORUS: Phosphorus: 4.6 mg/dL (ref 2.5–4.6)

## 2021-11-05 LAB — MAGNESIUM: Magnesium: 2.5 mg/dL — ABNORMAL HIGH (ref 1.7–2.4)

## 2021-11-05 LAB — TRIGLYCERIDES: Triglycerides: 296 mg/dL — ABNORMAL HIGH (ref <150)

## 2021-11-05 MED ORDER — CALCIUM GLUCONATE-NACL 1-0.675 GM/50ML-% IV SOLN
1.0000 g | Freq: Once | INTRAVENOUS | Status: AC
  Administered 2021-11-05: 1000 mg via INTRAVENOUS
  Filled 2021-11-05: qty 50

## 2021-11-05 MED ORDER — LACTATED RINGERS IV BOLUS
1000.0000 mL | Freq: Once | INTRAVENOUS | Status: AC
  Administered 2021-11-05: 1000 mL via INTRAVENOUS

## 2021-11-05 MED ORDER — MIDAZOLAM HCL 2 MG/2ML IJ SOLN
2.0000 mg | Freq: Once | INTRAMUSCULAR | Status: AC
  Administered 2021-11-05: 2 mg via INTRAVENOUS

## 2021-11-05 MED ORDER — LACTATED RINGERS IV BOLUS: 500.0000 mL | Freq: Once | INTRAVENOUS | Status: DC

## 2021-11-05 MED ORDER — BISACODYL 10 MG RE SUPP
10.0000 mg | Freq: Every day | RECTAL | Status: DC | PRN
  Administered 2021-11-05: 10 mg via RECTAL
  Filled 2021-11-05: qty 1

## 2021-11-05 MED ORDER — DEXMEDETOMIDINE HCL IN NACL 400 MCG/100ML IV SOLN
0.4000 ug/kg/h | INTRAVENOUS | Status: DC
  Administered 2021-11-05: 0.4 ug/kg/h via INTRAVENOUS
  Administered 2021-11-06: 0.6 ug/kg/h via INTRAVENOUS
  Administered 2021-11-06: 0.5 ug/kg/h via INTRAVENOUS
  Filled 2021-11-05 (×4): qty 100

## 2021-11-05 NOTE — Progress Notes (Addendum)
Multiple phone calls during patient's hospital stay from [facility]. She states she is his case manager outpatient and is requesting information at discharge to make sure he has everything he needs. Discussed our social work team will reach out as needed to help with his transition when discharged. HIPAA compliant conversation was had and no information in regards to his medical care was shared.

## 2021-11-05 NOTE — Progress Notes (Signed)
EEG complete - results pending 

## 2021-11-05 NOTE — Progress Notes (Addendum)
NAME:  Duane Richardson, MRN:  [id number], DOB:  1956/02/23, LOS: 5 ADMISSION DATE:  11/13/2021, CONSULTATION DATE:  10/26/2021 REFERRING MD:  Terrilee Croak, MD CHIEF COMPLAINT:  Hemoptysis, Respiratory arrest   History of Present Illness:  66 year old with tobacco abuse with COPD who presents with cough, hemoptysis, weight loss, fatigue. Imaging markedly abnormal and PCCM consulted. CTA neg for PE with large right pleural effusion and RUL mass measuring 5.3 x 3.8 cm with mass effect of right mainstem, BI, RML and RLLL with pleural based nodules, centrilobular emphysema. Had thoracentesis on 9/8 with 2L exudative bloody fluid 275.  CT A/P with probable hepatic, left adrenal and left perinrenal mets. Underwent EBUS 9/8 with sampling of 4R. He remained in the hospital for work-up for abdominal pain and constipation  9/10 patient had worsening respiratory status requiring 15L O2 with concern for aspiration. Rapid response called and patient had agonal breathing. Pulses reported intact. He was intubated on the floor. PCCM consulted for transfer.  Pertinent  Medical History  COPD DJD post THA HTN HLD  Significant Hospital Events: Including procedures, antibiotic start and stop dates in addition to other pertinent events   9/7 Admitted and thoracentesis 9/8 EBUS 9/10 Respiratory arrest and intubated on floor 9/11 Central line placed 9/12 Bedside US guided right pleural thoracentesis  Interim History / Subjective:  Yesterday afternoon right sided thoracentesis performed with 1.1L of bloody fluid removed. Improvement in oxygen requirements. Overnight, was able to remove vasopressin only requiring 26mcg of levophed.   Objective   Blood pressure 92/71, pulse (!) 105, temperature 99.3 F (37.4 C), temperature source Oral, resp. rate (!) 28, height 6\' 2"  (1.88 m), weight 58.5 kg, SpO2 100 %.    Vent Mode: PRVC FiO2 (%):  [60 %-100 %] 60 % Set Rate:  [28 bmp-30 bmp] 28 bmp Vt Set:  [640 mL] 640 mL PEEP:   [8 cmH20-10 cmH20] 10 cmH20 Plateau Pressure:  [25 cmH20-30 cmH20] 28 cmH20   Intake/Output Summary (Last 24 hours) at 11/05/2021 0640 Last data filed at 11/04/2021 2200 Gross per 24 hour  Intake 1001.28 ml  Output 855 ml  Net 146.28 ml    Filed Weights   10/25/2021 1634 11/04/21 0645  Weight: 59 kg 58.5 kg   Physical Exam: General: Critically ill-appearing, thin male sedated HENT: [place], AT, ETT in place Eyes: EOMI, no scleral icterus Respiratory: Improved rhonchi bilaterally.  No crackles, wheezing or rales Cardiovascular: regular rate and rhythm, No M,G,R GI: distended, soft, bowel sounds distant Extremities: extremities cool to touch, dry Neuro: Sedated  Resolved Hospital Problem list     Assessment & Plan:   Acute encephalopathy secondary respiratory arrest Acute hypoxemic respiratory failure secondary to suspected aspiration, post-obstructive pna and pleural effusion Improvement in FiO2 requirement s/p right thoracentesis and increased sedation, will decrease RR, PEEP to 8 and keep FiO2 at 60%. If requiring increased ventilator support will need to reassess for reaccumulation of pleural effusion. Will continue both sedatives and wean once patient has stabilized further. Trend triglycerides with propofol.  --Intubated. --Full vent support --Repeat ABG after change in vent settings --Vanc/Cefepime day 3, narrow per cultures --Follow blood and resp cx --PAD protocol for RASS goal -1 --VAP --triglycerides every 72 hours  Severe sepsis secondary unknown cause, post-obstructive pna? Septic Shock Able to remove vasopressin, on levophed only. Leukocytosis 39. Febrile yesterday up to 101.6. Respiratory, blood, and pleural effusion cultures negative thus far. Blood smear with persistent leukocytosis. With negative cultures and negative MRSA  nasal PCR, will discontinue vancomycin. Additional IV fluid bolus 1L this am.  --F/u cultures --Antibiotics as above --Echocardiogram with  normal EF, do not suspect cardiogenic component to shock. --LA improving, continue to trend until normal --Wean pressors as able, MAP goal over 65  Non-oliguric AKI 2/2 septic shock Urinary tract infection Hemoglobinuria Baseline Cr under 1.0 prior to respiratory arrest and shock. Cr 1.7>1.6.  and BUN 36>46 this am. 500 cc urine output overnight. UA yesterday with many WBC and large leukocytes with WBC clumps,mucous and hyaline casts.  -trend Cr -Abx as per above -foley catheter in place -Strict I/O -Continue sepsis treatment as per above  Anion gap metabolic acidosis 2/2 sepsis Bicarb 18 again today and AG of 13. Likely will improve with resolution of sepsis and hypoperfusion.   Hyponatremia - resolved Continue to monitor  Metastatic cancer with suspected lung primary Exudative pleural effusion s/p thoracentesis 9/7 and 9/12 EBUS 9/8 --F/u cytology  Abdominal pain/Constipation CT A/P with no obstruction or source of infection. Pain possibly secondary to metastatic disease. Continue OG tube placement. Start TF when able. Surgery evaluated yesterday do not suspect an acute abdomen.  --NGT to LIWS --Bowel regimen --OG in place with good output  Best Practice (right click and "Reselect all SmartList Selections" daily)   Diet/type: NPO, OG tube in place DVT prophylaxis: not indicated had hemoptysis GI prophylaxis: PPI Lines: Central line, a line Foley:  placed 11/03/21 Code Status:  full code Last date of multidisciplinary goals of care discussion [N/A] - Updated sister on telephone 9/11, will call again today  Labs   CBC: Recent Labs  Lab 11/02/21 1516 11/03/21 0249 11/03/21 1022 11/03/21 1236 11/03/21 2251 11/04/21 0539 11/04/21 0810 11/04/21 1505 11/04/21 1614 11/04/21 1647 11/05/21 0441 11/05/21 0529  WBC 37.5* 39.3*  --  34.3*  --  37.0*  --   --   --   --   --  39.1*  NEUTROABS 34.5* 30.7*  --   --   --   --   --   --   --   --   --  PENDING  HGB 13.3 13.9    < > 13.9   < > 12.6*   < > 11.6* 11.2* 11.2* 15.0 10.4*  HCT 38.1* 39.7   < > 43.0   < > 36.5*   < > 34.0* 32.4* 33.0* 44.0 29.6*  MCV 91.6 91.9  --  97.9  --  93.1  --   --   --   --   --  91.1  PLT 383 413*  --  358  --  278  --   --   --   --   --  228   < > = values in this interval not displayed.     Basic Metabolic Panel: Recent Labs  Lab 11/20/2021 0329 11/02/21 1516 11/03/21 0249 11/03/21 1022 11/03/21 1236 11/03/21 2251 11/04/21 0539 11/04/21 0810 11/04/21 1201 11/04/21 1505 11/04/21 1647 11/05/21 0441 11/05/21 0529  NA 132* 134* 134*   < > 137   < > 133*   < > 133* 134* 134* 134* 135  K 3.7 3.6 4.8   < > 5.3*   < > 4.9   < > 4.6 4.7 4.5 3.9 4.1  CL 102 107 101  --  104  --  101  --   --   --   --   --  104  CO2 20* 20* 20*  --  18*  --  18*  --   --   --   --   --  18*  GLUCOSE 113* 132* 187*  --  144*  --  155*  --   --   --   --   --  136*  BUN 16 16 22   --  27*  --  36*  --   --   --   --   --  46*  CREATININE 0.92 0.70 1.00  --  1.26*  --  1.77*  --   --   --   --   --  1.62*  CALCIUM 8.0* 8.0* 9.0  --  8.5*  --  7.6*  --   --   --   --   --  7.2*  MG 1.6*  --  2.1  --   --   --  1.7  --   --   --   --   --  2.5*  PHOS 3.5  --   --   --   --   --   --   --   --   --   --   --  4.6   < > = values in this interval not displayed.    GFR: Estimated Creatinine Clearance: 36.1 mL/min (A) (by C-G formula based on SCr of 1.62 mg/dL (H)). Recent Labs  Lab 11/03/21 0249 11/03/21 1236 11/03/21 1821 11/04/21 0539 11/04/21 1015 11/04/21 1540 11/04/21 1803 11/04/21 2345 11/05/21 0529 11/05/21 0531  WBC 39.3* 34.3*  --  37.0*  --   --   --   --  39.1*  --   LATICACIDVEN  --  6.4*   < > 4.5*   < > 3.3* 2.9* 3.1*  --  2.6*   < > = values in this interval not displayed.     Liver Function Tests: Recent Labs  Lab 11/10/2021 1700 11/12/2021 0329 11/03/21 0249 11/03/21 1236 11/04/21 1614  AST 20 17 20 22   --   ALT 11 7 8 7   --   ALKPHOS 98 75 81 67  --    BILITOT 1.1 1.1 0.4 0.9  --   PROT 6.8 5.8* 6.0* 4.9* 4.8*  ALBUMIN 2.7* 2.3* 2.3* 1.8*  --     No results for input(s): "LIPASE", "AMYLASE" in the last 168 hours. No results for input(s): "AMMONIA" in the last 168 hours.  ABG    Component Value Date/Time   PHART 7.437 11/05/2021 0441   PCO2ART 29.7 (L) 11/05/2021 0441   PO2ART 276 (H) 11/05/2021 0441   HCO3 19.9 (L) 11/05/2021 0441   TCO2 21 (L) 11/05/2021 0441   ACIDBASEDEF 3.0 (H) 11/05/2021 0441   O2SAT 100 11/05/2021 0441     Coagulation Profile: No results for input(s): "INR", "PROTIME" in the last 168 hours.  Cardiac Enzymes: No results for input(s): "CKTOTAL", "CKMB", "CKMBINDEX", "TROPONINI" in the last 168 hours.  HbA1C: No results found for: "HGBA1C"  CBG: Recent Labs  Lab 11/04/21 1144 11/04/21 1620 11/04/21 1905 11/04/21 2345 11/05/21 0336  GLUCAP 142* 141* 125* 141* 137*     Review of Systems:   Unable to obtain due to critical status  Past Medical History:  He,  has a past medical history of Allergy, Anxiety, Arthritis, Asthma, Atherosclerosis, Bipolar 1 disorder ([place]), Cataract, COPD (chronic obstructive pulmonary disease) ([place]), Depression, Elevated PSA, ETOH abuse, GERD (gastroesophageal reflux disease), Hyperlipidemia, Hypertension, and Right hip pain (04/13/2019).   Surgical History:   Past Surgical  History:  Procedure Laterality Date   BACK SURGERY     lunbar   BRONCHIAL NEEDLE ASPIRATION BIOPSY  10/30/2021   Procedure: BRONCHIAL NEEDLE ASPIRATION BIOPSIES;  Surgeon: Candee Furbish, MD;  Location: [facility] ENDOSCOPY;  Service: Pulmonary;;   COLONOSCOPY  12/21/09   OMV:[facility] papilla otherwise normal/ pancolonic diverticula/mutiple colonic poylps   COLONOSCOPY WITH ESOPHAGOGASTRODUODENOSCOPY (EGD) N/A 10/20/2012   Procedure: COLONOSCOPY WITH ESOPHAGOGASTRODUODENOSCOPY (EGD);  Surgeon: Daneil Dolin, MD;  Location: [facility];  Service: Endoscopy;  Laterality: N/A;  10;15   CYSTOSCOPY N/A  01/31/2013   Procedure: CYSTOSCOPY FLEXIBLE;  Surgeon: Marissa Nestle, MD;  Location: [facility] ORS;  Service: Urology;  Laterality: N/A;  I would like to do this around 1 pm on monday.    HARDWARE REMOVAL Left 06/07/2013   Procedure: HARDWARE REMOVAL;  Surgeon: Mcarthur Rossetti, MD;  Location: [place];  Service: Orthopedics;  Laterality: Left;   INTRAMEDULLARY (IM) NAIL INTERTROCHANTERIC Right 04/14/2019   Procedure: INTRAMEDULLARY (IM) NAIL INTERTROCHANTRIC;  Surgeon: Leandrew Koyanagi, MD;  Location: [place];  Service: Orthopedics;  Laterality: Right;   IR THORACENTESIS ASP PLEURAL SPACE W/IMG GUIDE  11/13/2021   ORIF HIP FRACTURE Left 11/24/2012   Procedure: OPEN REDUCTION INTERNAL FIXATION HIP;  Surgeon: Sanjuana Kava, MD;  Location: [facility];  Service: Orthopedics;  Laterality: Left;   SPINE SURGERY     TOTAL HIP ARTHROPLASTY Left 06/07/2013   Procedure: REMOVE HARDWARE LEFT HIP AND LEFT TOTAL HIP ARTHROPLASTY ANTERIOR APPROACH;  Surgeon: Mcarthur Rossetti, MD;  Location: [place];  Service: Orthopedics;  Laterality: Left;   TRANSURETHRAL RESECTION OF PROSTATE N/A 03/22/2013   Procedure: TRANSURETHRAL RESECTION OF THE PROSTATE (TURP);  Surgeon: Marissa Nestle, MD;  Location: [facility];  Service: Urology;  Laterality: N/A;   VIDEO BRONCHOSCOPY WITH ENDOBRONCHIAL ULTRASOUND N/A 11/12/2021   Procedure: VIDEO BRONCHOSCOPY WITH ENDOBRONCHIAL ULTRASOUND;  Surgeon: Candee Furbish, MD;  Location: [facility] ENDOSCOPY;  Service: Pulmonary;  Laterality: N/A;     Social History:   reports that he has been smoking cigarettes. He has a 15.00 pack-year smoking history. He has never used smokeless tobacco. He reports that he does not currently use alcohol. He reports that he does not currently use drugs after having used the following drugs: Marijuana and Cocaine.   Family History:  His family history includes Mental illness in his sister. There is no history of Colon cancer.   Allergies No Known Allergies   Home  Medications  Prior to Admission medications   Medication Sig Start Date End Date Taking? Authorizing Provider  acetaminophen (TYLENOL) 325 MG tablet Take 650 mg by mouth every 6 (six) hours as needed.   Yes [provider]  Carboxymethylcellul-Glycerin (CLEAR EYES FOR DRY EYES) 1-0.25 % SOLN Apply 1-2 drops to eye 2 (two) times daily as needed.   Yes [provider]  ibuprofen (ADVIL) 200 MG tablet Take 200 mg by mouth every 6 (six) hours as needed for moderate pain.   Yes [provider]  PROAIR HFA 108 (90 Base) MCG/ACT inhaler INHALE 2 PUFFS INTO THE LUNGS EVERY 4 TO 6 HOURS AS NEEDED. Patient taking differently: Inhale 2 puffs into the lungs every 4 (four) hours as needed for wheezing or shortness of breath. 08/28/16  Yes Susy Frizzle, MD  SYMBICORT 160-4.5 MCG/ACT inhaler Inhale 1 puff into the lungs 2 (two) times daily. 02/08/19  Yes [provider]  diclofenac Sodium (VOLTAREN) 1 % GEL Apply 2 g topically 4 (  four) times daily. 11/04/20   Tacy Learn, PA-C  sertraline (ZOLOFT) 25 MG tablet Take 25 mg by mouth daily. 07/10/21   [provider]  tiZANidine (ZANAFLEX) 2 MG tablet Take 2 mg by mouth 2 (two) times daily as needed. 05/31/19   [provider]    [place]  Internal Medicine Resident PGY-3 [place]  Pager: [phone]

## 2021-11-05 NOTE — Procedures (Signed)
Patient Name: Duane Richardson  MRN: [id number]  Epilepsy Attending: Lora Havens  Referring Physician/Provider: Riesa Pope, MD  Date: 11/05/2021 Duration: 22.02 mins  Patient history: 66 year old male with altered mental status.  EEG to evaluate for seizure.  Level of alertness: comatose  AEDs during EEG study: propofol  Technical aspects: This EEG study was done with scalp electrodes positioned according to the 10-20 International system of electrode placement. Electrical activity was reviewed with band pass filter of 1-70Hz , sensitivity of 7 uV/mm, display speed of 44mm/sec with a 60Hz  notched filter applied as appropriate. EEG data were recorded continuously and digitally stored.  Video monitoring was available and reviewed as appropriate.  Description: EEG showed continuous generalized  3 to 6 Hz theta-delta slowing. Hyperventilation and photic stimulation were not performed.     ABNORMALITY - Continuous slow, generalized  IMPRESSION: This study is suggestive of moderate to severe diffuse encephalopathy, nonspecific etiology. No seizures or epileptiform discharges were seen throughout the recording.  [name]

## 2021-11-06 ENCOUNTER — Inpatient Hospital Stay (HOSPITAL_BASED_OUTPATIENT_CLINIC_OR_DEPARTMENT_OTHER): Payer: Medicare Other

## 2021-11-06 ENCOUNTER — Inpatient Hospital Stay (HOSPITAL_COMMUNITY): Payer: Medicare Other

## 2021-11-06 DIAGNOSIS — R208 Other disturbances of skin sensation: Secondary | ICD-10-CM

## 2021-11-06 DIAGNOSIS — E872 Acidosis, unspecified: Secondary | ICD-10-CM

## 2021-11-06 DIAGNOSIS — I469 Cardiac arrest, cause unspecified: Secondary | ICD-10-CM

## 2021-11-06 DIAGNOSIS — A419 Sepsis, unspecified organism: Secondary | ICD-10-CM

## 2021-11-06 DIAGNOSIS — N179 Acute kidney failure, unspecified: Secondary | ICD-10-CM

## 2021-11-06 DIAGNOSIS — G9341 Metabolic encephalopathy: Secondary | ICD-10-CM

## 2021-11-06 DIAGNOSIS — J9600 Acute respiratory failure, unspecified whether with hypoxia or hypercapnia: Secondary | ICD-10-CM

## 2021-11-06 LAB — CBC WITH DIFFERENTIAL/PLATELET
Abs Immature Granulocytes: 3.13 10*3/uL — ABNORMAL HIGH (ref 0.00–0.07)
Basophils Absolute: 0 10*3/uL (ref 0.0–0.1)
Basophils Relative: 0 %
Eosinophils Absolute: 0.2 10*3/uL (ref 0.0–0.5)
Eosinophils Relative: 1 %
HCT: 29.3 % — ABNORMAL LOW (ref 39.0–52.0)
Hemoglobin: 10.3 g/dL — ABNORMAL LOW (ref 13.0–17.0)
Immature Granulocytes: 7 %
Lymphocytes Relative: 2 %
Lymphs Abs: 1 10*3/uL (ref 0.7–4.0)
MCH: 32.6 pg (ref 26.0–34.0)
MCHC: 35.2 g/dL (ref 30.0–36.0)
MCV: 92.7 fL (ref 80.0–100.0)
Monocytes Absolute: 3.9 10*3/uL — ABNORMAL HIGH (ref 0.1–1.0)
Monocytes Relative: 9 %
Neutro Abs: 36 10*3/uL — ABNORMAL HIGH (ref 1.7–7.7)
Neutrophils Relative %: 81 %
Platelets: 221 10*3/uL (ref 150–400)
RBC: 3.16 MIL/uL — ABNORMAL LOW (ref 4.22–5.81)
RDW: 13.5 % (ref 11.5–15.5)
Smear Review: NORMAL
WBC: 44.3 10*3/uL — ABNORMAL HIGH (ref 4.0–10.5)
nRBC: 0 % (ref 0.0–0.2)

## 2021-11-06 LAB — BASIC METABOLIC PANEL
Anion gap: 11 (ref 5–15)
BUN: 40 mg/dL — ABNORMAL HIGH (ref 8–23)
CO2: 18 mmol/L — ABNORMAL LOW (ref 22–32)
Calcium: 7.3 mg/dL — ABNORMAL LOW (ref 8.9–10.3)
Chloride: 110 mmol/L (ref 98–111)
Creatinine, Ser: 1.28 mg/dL — ABNORMAL HIGH (ref 0.61–1.24)
GFR, Estimated: >60 mL/min (ref >60)
Glucose, Bld: 94 mg/dL (ref 70–99)
Potassium: 3.9 mmol/L (ref 3.5–5.1)
Sodium: 139 mmol/L (ref 135–145)

## 2021-11-06 LAB — MAGNESIUM
Magnesium: 2.4 mg/dL (ref 1.7–2.4)
Magnesium: 2.5 mg/dL — ABNORMAL HIGH (ref 1.7–2.4)

## 2021-11-06 LAB — GLUCOSE, CAPILLARY
Glucose-Capillary: 100 mg/dL — ABNORMAL HIGH (ref 70–99)
Glucose-Capillary: 117 mg/dL — ABNORMAL HIGH (ref 70–99)
Glucose-Capillary: 142 mg/dL — ABNORMAL HIGH (ref 70–99)
Glucose-Capillary: 143 mg/dL — ABNORMAL HIGH (ref 70–99)
Glucose-Capillary: 64 mg/dL — ABNORMAL LOW (ref 70–99)
Glucose-Capillary: 64 mg/dL — ABNORMAL LOW (ref 70–99)
Glucose-Capillary: 67 mg/dL — ABNORMAL LOW (ref 70–99)
Glucose-Capillary: 90 mg/dL (ref 70–99)
Glucose-Capillary: 93 mg/dL (ref 70–99)

## 2021-11-06 LAB — POCT I-STAT 7, (LYTES, BLD GAS, ICA,H+H)
Acid-base deficit: 4 mmol/L — ABNORMAL HIGH (ref 0.0–2.0)
Bicarbonate: 19.4 mmol/L — ABNORMAL LOW (ref 20.0–28.0)
Calcium, Ion: 1.12 mmol/L — ABNORMAL LOW (ref 1.15–1.40)
HCT: 29 % — ABNORMAL LOW (ref 39.0–52.0)
Hemoglobin: 9.9 g/dL — ABNORMAL LOW (ref 13.0–17.0)
O2 Saturation: 97 %
Patient temperature: 98.6
Potassium: 4 mmol/L (ref 3.5–5.1)
Sodium: 138 mmol/L (ref 135–145)
TCO2: 20 mmol/L — ABNORMAL LOW (ref 22–32)
pCO2 arterial: 30 mmHg — ABNORMAL LOW (ref 32–48)
pH, Arterial: 7.418 (ref 7.35–7.45)
pO2, Arterial: 87 mmHg (ref 83–108)

## 2021-11-06 LAB — CYTOLOGY - NON PAP

## 2021-11-06 LAB — PHOSPHORUS
Phosphorus: 3.5 mg/dL (ref 2.5–4.6)
Phosphorus: 2.3 mg/dL — ABNORMAL LOW (ref 2.5–4.6)

## 2021-11-06 LAB — LACTIC ACID, PLASMA
Lactic Acid, Venous: 2 mmol/L (ref 0.5–1.9)
Lactic Acid, Venous: 2.2 mmol/L (ref 0.5–1.9)
Lactic Acid, Venous: 2.2 mmol/L (ref 0.5–1.9)
Lactic Acid, Venous: 2.2 mmol/L (ref 0.5–1.9)

## 2021-11-06 LAB — TRIGLYCERIDES: Triglycerides: 233 mg/dL — ABNORMAL HIGH (ref <150)

## 2021-11-06 LAB — CULTURE, BODY FLUID W GRAM STAIN -BOTTLE: Culture: NO GROWTH

## 2021-11-06 MED ORDER — PROSOURCE TF20 ENFIT COMPATIBL EN LIQD: 60.0000 mL | Freq: Every day | ENTERAL | Status: DC

## 2021-11-06 MED ORDER — VITAL AF 1.2 CAL PO LIQD
1000.0000 mL | ORAL | Status: DC
  Administered 2021-11-06 – 2021-11-07 (×2): 1000 mL

## 2021-11-06 MED ORDER — VITAL AF 1.2 CAL PO LIQD: 1000.0000 mL | ORAL | Status: DC

## 2021-11-06 MED ORDER — ADULT MULTIVITAMIN W/MINERALS CH
1.0000 | ORAL_TABLET | Freq: Every day | ORAL | Status: DC
  Administered 2021-11-06 – 2021-11-15 (×10): 1
  Filled 2021-11-06 (×10): qty 1

## 2021-11-06 MED ORDER — VITAL HIGH PROTEIN PO LIQD: 1000.0000 mL | ORAL | Status: DC

## 2021-11-06 MED ORDER — PANTOPRAZOLE 2 MG/ML SUSPENSION
40.0000 mg | Freq: Every day | ORAL | Status: DC
  Administered 2021-11-06 – 2021-11-15 (×10): 40 mg
  Filled 2021-11-06 (×11): qty 20

## 2021-11-06 MED ORDER — SODIUM CHLORIDE 0.9% FLUSH: 10.0000 mL | INTRAVENOUS | Status: DC | PRN

## 2021-11-06 MED ORDER — LACTATED RINGERS IV BOLUS
1000.0000 mL | Freq: Once | INTRAVENOUS | Status: AC
  Administered 2021-11-06: 1000 mL via INTRAVENOUS

## 2021-11-06 MED ORDER — SODIUM CHLORIDE 0.9% FLUSH
10.0000 mL | Freq: Two times a day (BID) | INTRAVENOUS | Status: DC
  Administered 2021-11-06 (×2): 10 mL
  Administered 2021-11-06: 20 mL

## 2021-11-06 MED ORDER — DEXTROSE 50 % IV SOLN
INTRAVENOUS | Status: AC
  Administered 2021-11-06: 50 mL
  Filled 2021-11-06: qty 50

## 2021-11-06 MED ORDER — VANCOMYCIN HCL 1250 MG/250ML IV SOLN
1250.0000 mg | Freq: Once | INTRAVENOUS | Status: AC
  Administered 2021-11-06: 1250 mg via INTRAVENOUS
  Filled 2021-11-06: qty 250

## 2021-11-06 MED ORDER — DEXTROSE 50 % IV SOLN
12.5000 g | INTRAVENOUS | Status: AC
  Administered 2021-11-06: 12.5 g via INTRAVENOUS
  Filled 2021-11-06: qty 50

## 2021-11-06 MED ORDER — VANCOMYCIN HCL 750 MG/150ML IV SOLN
750.0000 mg | INTRAVENOUS | Status: DC
  Filled 2021-11-06: qty 150

## 2021-11-06 NOTE — Progress Notes (Signed)
Pt placed on PS/CPAP 8/5 on 40% and is tolerating well. RT will monitor. 

## 2021-11-06 NOTE — Progress Notes (Signed)
Spoke with sister Watt Climes to confirm shaving of facial hair for CVC DRSG. Lacey Jensen RN verified.

## 2021-11-06 NOTE — Progress Notes (Signed)
Hypoglycemic Event  CBG: 64  Treatment: D50 50 mL (25 gm)  Symptoms: None  Follow-up CBG: Time:2112 CBG Result:142  Possible Reasons for Event: Inadequate meal intake    Mardi Mainland

## 2021-11-06 NOTE — Progress Notes (Addendum)
NAME:  Duane Richardson, MRN:  [id number], DOB:  1955-06-27, LOS: 6 ADMISSION DATE:  11/07/2021, CONSULTATION DATE:  11/07/2021 REFERRING MD:  Terrilee Croak, MD CHIEF COMPLAINT:  Hemoptysis, Respiratory arrest   History of Present Illness:  66 year old with tobacco abuse with COPD who presents with cough, hemoptysis, weight loss, fatigue. Imaging markedly abnormal and PCCM consulted. CTA neg for PE with large right pleural effusion and RUL mass measuring 5.3 x 3.8 cm with mass effect of right mainstem, BI, RML and RLLL with pleural based nodules, centrilobular emphysema. Had thoracentesis on 9/8 with 2L exudative bloody fluid 275.  CT A/P with probable hepatic, left adrenal and left perinrenal mets. Underwent EBUS 9/8 with sampling of 4R. He remained in the hospital for work-up for abdominal pain and constipation  9/10 patient had worsening respiratory status requiring 15L O2 with concern for aspiration. Rapid response called and patient had agonal breathing. Pulses reported intact. He was intubated on the floor. PCCM consulted for transfer.  Pertinent  Medical History  COPD DJD post THA HTN HLD  Significant Hospital Events: Including procedures, antibiotic start and stop dates in addition to other pertinent events   9/7 Admitted and thoracentesis 9/8 EBUS 9/10 Respiratory arrest and intubated on floor 9/11 Central line placed 9/12 Bedside US guided right pleural thoracentesis  Interim History / Subjective:  Yesterday morning patient with fluttering eye movements upward after propofol initiated with purposeful movements.  EEG was performed and negative for seizure-like activity, once propofol discontinued this resolved.  Overnight continues to be on Levophed and sedatives of Precedex and fentanyl.  Had 500 cc urine output and a small BM.  Objective   Blood pressure (!) 79/55, pulse 86, temperature 98.6 F (37 C), temperature source Oral, resp. rate (!) 24, height 6\' 2"  (1.88 m), weight 58.5 kg,  SpO2 100 %.    Vent Mode: PRVC FiO2 (%):  [40 %-60 %] 40 % Set Rate:  [20 bmp-24 bmp] 20 bmp Vt Set:  [640 mL] 640 mL PEEP:  [5 cmH20-8 cmH20] 5 cmH20 Plateau Pressure:  [22 cmH20-25 cmH20] 25 cmH20   Intake/Output Summary (Last 24 hours) at 11/06/2021 0649 Last data filed at 11/06/2021 0600 Gross per 24 hour  Intake 2160.43 ml  Output 850 ml  Net 1310.43 ml   Filed Weights   11/11/2021 1634 11/04/21 0645  Weight: 59 kg 58.5 kg   Physical Exam: General: Critically ill-appearing, thin male sedated HENT: [place], AT, ETT in place Eyes: No scleral icterus.  Pupils reactive to light. Respiratory: Mechanical ventilation appreciated.  No crackles, wheezing or rales Cardiovascular: regular rate and rhythm, No M,G,R GI: distended, soft, distant bowel sounds Extremities: Lower extremities cool to touch, dry Neuro: Sedated.  Not following commands  Resolved Hospital Problem list     Assessment & Plan:   Acute encephalopathy secondary respiratory arrest Acute hypoxemic respiratory failure secondary to suspected aspiration, post-obstructive pna and pleural effusion Continues to ventilate well with decreased sedatives.  Blood gas has been stable over the past 24 hours.  We will plan to wean sedatives today and once no longer requiring vasopressors can consider SBT. --Intubated. --Full vent support --Cefepime day 4, narrow per cultures.  Restart vancomycin today. --Follow blood and resp cx --PAD protocol for RASS goal -1 --VAP --triglycerides without change, propofol discontinued.  Septic shock secondary unknown cause, post-obstructive pna vs UTI Continue pressor needs, Levophed 6 mcg/min.  Lactic acid continues to improve 2.1.  Will give additional 1 L bolus this  morning.  His leukocytosis has continued to trend up from 34 on admission to 44.3 today.  Blood smear with evidence of left shift and leukocytosis.  Low suspicion that this is secondary to his malignancy and with continuously  requiring vasopressors will restart vancomycin. --Blood and pleural cultures negative thus far --Antibiotics as above --LA improving, additional 1 L bolus today, trend lactate until normalized --Wean pressors as able, MAP goal over 65  Non-oliguric AKI 2/2 septic shock Urinary tract infection Hemoglobinuria Baseline Cr under 1.0 prior to respiratory arrest and shock. Cr 1.6>1.2.  and BUN 46>40 this am. 500 cc urine output overnight. -trend Cr -Abx as per above -foley catheter in place -Strict I/O  Anion gap metabolic acidosis 2/2 septic shock Bicarb 18 again today and AG of 11.  Continue to rate septic shock as per above  Hyponatremia - resolved Continue to monitor  Metastatic cancer with suspected lung primary Exudative pleural effusion s/p thoracentesis 9/7 and 9/12 EBUS 9/8 --F/u cytology  Abdominal pain/Constipation CT A/P with no obstruction or source of infection. Pain possibly secondary to metastatic disease. Continue OG tube placement. Start TF when able. Surgery evaluated yesterday do not suspect an acute abdomen.  --NGT to LIWS --Bowel regimen --OG in place with good output  Best Practice (right click and "Reselect all SmartList Selections" daily)   Diet/type: NPO, OG tube in place DVT prophylaxis: Subcu heparin GI prophylaxis: PPI Lines: Central line, a line Foley:  placed 11/03/21 Code Status:  full code Last date of multidisciplinary goals of care discussion [N/A] -discussed with sister on 9/13  Labs   CBC: Recent Labs  Lab 11/02/21 1516 11/03/21 0249 11/03/21 1022 11/03/21 1236 11/03/21 2251 11/04/21 0539 11/04/21 0810 11/05/21 0529 11/05/21 0732 11/05/21 1211 11/06/21 0016 11/06/21 0402  WBC 37.5* 39.3*  --  34.3*  --  37.0*  --  39.1*  --   --  44.3*  --   NEUTROABS 34.5* 30.7*  --   --   --   --   --  31.1*  --   --  36.0*  --   HGB 13.3 13.9   < > 13.9   < > 12.6*   < > 10.4* 10.2* 13.9 10.3* 9.9*  HCT 38.1* 39.7   < > 43.0   < > 36.5*   <  > 29.6* 30.0* 41.0 29.3* 29.0*  MCV 91.6 91.9  --  97.9  --  93.1  --  91.1  --   --  92.7  --   PLT 383 413*  --  358  --  278  --  228  --   --  221  --    < > = values in this interval not displayed.    Basic Metabolic Panel: Recent Labs  Lab 11/04/2021 0329 11/02/21 1516 11/03/21 0249 11/03/21 1022 11/03/21 1236 11/03/21 2251 11/04/21 0539 11/04/21 0810 11/05/21 0529 11/05/21 0732 11/05/21 1211 11/06/21 0016 11/06/21 0402  NA 132*   < > 134*   < > 137   < > 133*   < > 135 135 136 139 138  K 3.7   < > 4.8   < > 5.3*   < > 4.9   < > 4.1 3.9 3.8 3.9 4.0  CL 102   < > 101  --  104  --  101  --  104  --   --  110  --   CO2 20*   < > 20*  --  18*  --  18*  --  18*  --   --  18*  --   GLUCOSE 113*   < > 187*  --  144*  --  155*  --  136*  --   --  94  --   BUN 16   < > 22  --  27*  --  36*  --  46*  --   --  40*  --   CREATININE 0.92   < > 1.00  --  1.26*  --  1.77*  --  1.62*  --   --  1.28*  --   CALCIUM 8.0*   < > 9.0  --  8.5*  --  7.6*  --  7.2*  --   --  7.3*  --   MG 1.6*  --  2.1  --   --   --  1.7  --  2.5*  --   --  2.4  --   PHOS 3.5  --   --   --   --   --   --   --  4.6  --   --  3.5  --    < > = values in this interval not displayed.   GFR: Estimated Creatinine Clearance: 45.7 mL/min (A) (by C-G formula based on SCr of 1.28 mg/dL (H)). Recent Labs  Lab 11/03/21 1236 11/03/21 1821 11/04/21 0539 11/04/21 1015 11/05/21 0529 11/05/21 0531 11/05/21 1025 11/05/21 1325 11/05/21 1856 11/06/21 0016 11/06/21 0017  WBC 34.3*  --  37.0*  --  39.1*  --   --   --   --  44.3*  --   LATICACIDVEN 6.4*   < > 4.5*   < >  --    < > 2.3* 2.2* 2.1*  --  2.2*   < > = values in this interval not displayed.    Liver Function Tests: Recent Labs  Lab 11/02/2021 1700 11/10/2021 0329 11/03/21 0249 11/03/21 1236 11/04/21 1614  AST 20 17 20 22   --   ALT 11 7 8 7   --   ALKPHOS 98 75 81 67  --   BILITOT 1.1 1.1 0.4 0.9  --   PROT 6.8 5.8* 6.0* 4.9* 4.8*  ALBUMIN 2.7* 2.3* 2.3*  1.8*  --    No results for input(s): "LIPASE", "AMYLASE" in the last 168 hours. No results for input(s): "AMMONIA" in the last 168 hours.  ABG    Component Value Date/Time   PHART 7.418 11/06/2021 0402   PCO2ART 30.0 (L) 11/06/2021 0402   PO2ART 87 11/06/2021 0402   HCO3 19.4 (L) 11/06/2021 0402   TCO2 20 (L) 11/06/2021 0402   ACIDBASEDEF 4.0 (H) 11/06/2021 0402   O2SAT 97 11/06/2021 0402     Coagulation Profile: No results for input(s): "INR", "PROTIME" in the last 168 hours.  Cardiac Enzymes: No results for input(s): "CKTOTAL", "CKMB", "CKMBINDEX", "TROPONINI" in the last 168 hours.  HbA1C: No results found for: "HGBA1C"  CBG: Recent Labs  Lab 11/05/21 1120 11/05/21 1533 11/05/21 1902 11/05/21 2318 11/06/21 0336  GLUCAP 88 81 99 87 93    Review of Systems:   Unable to obtain due to critical status  Past Medical History:  He,  has a past medical history of Allergy, Anxiety, Arthritis, Asthma, Atherosclerosis, Bipolar 1 disorder ([place]), Cataract, COPD (chronic obstructive pulmonary disease) ([place]), Depression, Elevated PSA, ETOH abuse, GERD (gastroesophageal reflux disease), Hyperlipidemia, Hypertension, and Right hip pain (04/13/2019).  Surgical History:   Past Surgical History:  Procedure Laterality Date   BACK SURGERY     lunbar   BRONCHIAL NEEDLE ASPIRATION BIOPSY  11/19/2021   Procedure: BRONCHIAL NEEDLE ASPIRATION BIOPSIES;  Surgeon: Candee Furbish, MD;  Location: [facility] ENDOSCOPY;  Service: Pulmonary;;   COLONOSCOPY  12/21/09   TKW:IOXB papilla otherwise normal/ pancolonic diverticula/mutiple colonic poylps   COLONOSCOPY WITH ESOPHAGOGASTRODUODENOSCOPY (EGD) N/A 10/20/2012   Procedure: COLONOSCOPY WITH ESOPHAGOGASTRODUODENOSCOPY (EGD);  Surgeon: Daneil Dolin, MD;  Location: [facility] ENDO SU[facility];  Service: Endoscopy;  Laterality: N/A;  10;15   CYSTOSCOPY N/A 01/31/2013   Procedure: CYSTOSCOPY FLEXIBLE;  Surgeon: Marissa Nestle, MD;  Location: [facility] ORS;  Service:  Urology;  Laterality: N/A;  I would like to do this around 1 pm on monday.    HARDWARE REMOVAL Left 06/07/2013   Procedure: HARDWARE REMOVAL;  Surgeon: Mcarthur Rossetti, MD;  Location: [place];  Service: Orthopedics;  Laterality: Left;   INTRAMEDULLARY (IM) NAIL INTERTROCHANTERIC Right 04/14/2019   Procedure: INTRAMEDULLARY (IM) NAIL INTERTROCHANTRIC;  Surgeon: Leandrew Koyanagi, MD;  Location: [place];  Service: Orthopedics;  Laterality: Right;   IR THORACENTESIS ASP PLEURAL SPACE W/IMG GUIDE  10/28/2021   ORIF HIP FRACTURE Left 11/24/2012   Procedure: OPEN REDUCTION INTERNAL FIXATION HIP;  Surgeon: Sanjuana Kava, MD;  Location: [facility] ORS;  Service: Orthopedics;  Laterality: Left;   SPINE SURGERY     TOTAL HIP ARTHROPLASTY Left 06/07/2013   Procedure: REMOVE HARDWARE LEFT HIP AND LEFT TOTAL HIP ARTHROPLASTY ANTERIOR APPROACH;  Surgeon: Mcarthur Rossetti, MD;  Location: [place];  Service: Orthopedics;  Laterality: Left;   TRANSURETHRAL RESECTION OF PROSTATE N/A 03/22/2013   Procedure: TRANSURETHRAL RESECTION OF THE PROSTATE (TURP);  Surgeon: Marissa Nestle, MD;  Location: [facility] OR[facility];  Service: Urology;  Laterality: N/A;   VIDEO BRONCHOSCOPY WITH ENDOBRONCHIAL ULTRASOUND N/A 11/21/2021   Procedure: VIDEO BRONCHOSCOPY WITH ENDOBRONCHIAL ULTRASOUND;  Surgeon: Candee Furbish, MD;  Location: [facility] ENDOSCOPY;  Service: Pulmonary;  Laterality: N/A;     Social History:   reports that he has been smoking cigarettes. He has a 15.00 pack-year smoking history. He has never used smokeless tobacco. He reports that he does not currently use alcohol. He reports that he does not currently use drugs after having used the following drugs: Marijuana and Cocaine.   Family History:  His family history includes Mental illness in his sister. There is no history of Colon cancer.   Allergies No Known Allergies   Home Medications  Prior to Admission medications   Medication Sig Start Date End Date Taking? Authorizing Provider   acetaminophen (TYLENOL) 325 MG tablet Take 650 mg by mouth every 6 (six) hours as needed.   Yes [provider]  Carboxymethylcellul-Glycerin (CLEAR EYES FOR DRY EYES) 1-0.25 % SOLN Apply 1-2 drops to eye 2 (two) times daily as needed.   Yes [provider]  ibuprofen (ADVIL) 200 MG tablet Take 200 mg by mouth every 6 (six) hours as needed for moderate pain.   Yes [provider]  PROAIR HFA 108 (90 Base) MCG/ACT inhaler INHALE 2 PUFFS INTO THE LUNGS EVERY 4 TO 6 HOURS AS NEEDED. Patient taking differently: Inhale 2 puffs into the lungs every 4 (four) hours as needed for wheezing or shortness of breath. 08/28/16  Yes Susy Frizzle, MD  SYMBICORT 160-4.5 MCG/ACT inhaler Inhale 1 puff into the lungs 2 (two) times daily. 02/08/19  Yes [provider]  diclofenac Sodium (VOLTAREN) 1 %  GEL Apply 2 g topically 4 (four) times daily. 11/04/20   Tacy Learn, PA-C  sertraline (ZOLOFT) 25 MG tablet Take 25 mg by mouth daily. 07/10/21   [provider]  tiZANidine (ZANAFLEX) 2 MG tablet Take 2 mg by mouth 2 (two) times daily as needed. 05/31/19   [provider]    [place]  Internal Medicine Resident PGY-3 [place]  Pager: [phone]

## 2021-11-06 NOTE — TOC Progression Note (Signed)
Transition of Care [facility]) - Progression Note    Patient Details  Name: Duane Richardson MRN: [id number] Date of Birth: September 12, 1955  Transition of [facility]) CM/SW Contact  Tom-Johnson, Renea Ee, RN Phone Number: 11/06/2021, 2:03 PM  Clinical Narrative:     Patient continues on full vent support. On IV abx, IV drips, Trickle feeds and foley cath.  No TOC needs noted at this time. CM will continue to follow as patient progresses with care towards discharge.        Expected Discharge Plan and Services                                                 Social Determinants of Health (SDOH) Interventions    Readmission Risk Interventions    07/04/2019   11:33 AM 04/20/2019   10:53 AM  Readmission Risk Prevention Plan  Transportation Screening Complete Complete  PCP or Specialist Appt within 5-7 Days  Not Complete  Not Complete comments  plan for SNF  Home Care Screening  Complete  Medication Review (RN CM) Complete Referral to Pharmacy

## 2021-11-06 NOTE — Progress Notes (Signed)
PCCM Progress Note  Updated sister on diagnosis of lung adenocarcinoma. No plan for treatment while critically ill in the ICU. Will plan to consult Hem/Onc when he is hemodynamically stable and recovered from acute illness. Addressed questions and concerns. Will update again tomorrow regarding his clinical status  Rodman Pickle, M.D. [facility] Pulmonary/Critical Care Medicine 11/06/2021 6:24 PM

## 2021-11-06 NOTE — Progress Notes (Signed)
ABI's have been completed. Preliminary results can be found in CV Proc through chart review.   11/06/21 4:37 PM Duane Richardson RVT

## 2021-11-06 NOTE — Progress Notes (Addendum)
Pharmacy Antibiotic Note  Duane Richardson is a 66 y.o. male admitted on 10/25/2021 with pneumonia.  Pharmacy has been consulted for Cefepime and Vancomcyin dosing. WBC elevated at 44.3. Afebrile. SCr up to 1.28. Levophed at 5.   Patient has remained afebrile in the last 24 hours and vasopressor requirements have continued to decrease, however repeat CXR shows bibasilar atelectasis and infiltrates and WBC increased 39.1 > 44.3. MRSA PCR from 9/10 was negative, not repeating at this time.   Plan: Cefepime 2g IV every 12 hours , continue based on current renal function.  Give vancomycin 1250 mg x1 dose Start vancomycin 750 mg IV Q24h (eAUC 420, Scr 1.28) Monitor renal function, culture results, and clinical status.   Height: 6\' 2"  (188 cm) Weight: 58.5 kg (128 lb 15.5 oz) IBW/kg (Calculated) : 82.2  Temp (24hrs), Avg:98.7 F (37.1 C), Min:97.9 F (36.6 C), Max:99.3 F (37.4 C)  Recent Labs  Lab 11/03/21 0249 11/03/21 1236 11/03/21 1821 11/04/21 0539 11/04/21 1015 11/05/21 0529 11/05/21 0531 11/05/21 1025 11/05/21 1325 11/05/21 1856 11/06/21 0016 11/06/21 0017 11/06/21 0643  WBC 39.3* 34.3*  --  37.0*  --  39.1*  --   --   --   --  44.3*  --   --   CREATININE 1.00 1.26*  --  1.77*  --  1.62*  --   --   --   --  1.28*  --   --   LATICACIDVEN  --  6.4*   < > 4.5*   < >  --    < > 2.3* 2.2* 2.1*  --  2.2* 2.2*   < > = values in this interval not displayed.     Estimated Creatinine Clearance: 45.7 mL/min (A) (by C-G formula based on SCr of 1.28 mg/dL (H)).    No Known Allergies  Antimicrobials this admission: Cefepime 9/10 >>  Vancomycin 9/10 >> 9/12, 9/13 >>  Dose adjustments this admission: N/A  Microbiology results: 9/8 Pleural fluid: NGTD x 2d  9/10 BCx: NGTD x 2d   9/10 Sputum: not collected 9/10 MRSA PCR: negative  9/13: Resp cx: sent 9/13  Thank you for allowing pharmacy to be a part of this patient's care.  Louanne Belton, PharmD, [facility] PGY1 Pharmacy  Resident 11/06/2021 9:21 AM

## 2021-11-06 NOTE — Progress Notes (Signed)
NAME:  Duane Richardson, MRN:  [id number], DOB:  June 19, 1955, LOS: 6 ADMISSION DATE:  11/23/2021, CONSULTATION DATE:  11/13/2021 REFERRING MD:  Terrilee Croak, MD CHIEF COMPLAINT:  Hemoptysis, Respiratory arrest   History of Present Illness:  66 year old with tobacco abuse with COPD who presented with cough, hemoptysis, weight loss, fatigue. Imaging markedly abnormal and PCCM consulted. CTA neg for PE with large right pleural effusion and RUL mass measuring 5.3 x 3.8 cm with mass effect of right mainstem, BI, RML and RLLL with pleural based nodules, centrilobular emphysema. Had thoracentesis on 9/8 with 2L exudative bloody fluid 275.  CT A/P with probable hepatic, left adrenal and left perinrenal mets. Underwent EBUS 9/8 with sampling of 4R. He remained in the hospital for work-up for abdominal pain and constipation  9/10 patient had worsening respiratory status requiring 15L O2 with concern for aspiration. Rapid response called and patient had agonal breathing. Pulses reported intact. He was intubated on the floor. PCCM consulted for transfer.  Pertinent  Medical History  COPD DJD post THA HTN HLD  Significant Hospital Events: Including procedures, antibiotic start and stop dates in addition to other pertinent events   9/7 Admitted and thoracentesis- 9/8 EBUS 9/10 Respiratory arrest and intubated on floor 9/11 Central line placed 9/12 Bedside US guided right pleural thoracentesis. EEG 9_) seizures  9/13 cytology back:->pleural fluid + malignant cells Adenocarcinoma; EBUS: Malignant cells consistent with lung adenocarcinoma from 4R LN; still pressor dependent.  Family updated by Dr. Loanne Drilling.  ABIs completed normal 9/14 pleural effusion reaccumulated.  Agitated Precedex switch back to propofol  Interim History / Subjective:  Sedated on ventilator Objective   Blood pressure 91/67, pulse (Abnormal) 102, temperature 99 F (37.2 C), temperature source Axillary, resp. rate (Abnormal) 27, height 6\' 2"   (1.88 m), weight 58.5 kg, SpO2 98 %.    Vent Mode: PRVC FiO2 (%):  [40 %] 40 % Set Rate:  [20 bmp-24 bmp] 20 bmp Vt Set:  [640 mL] 640 mL PEEP:  [5 cmH20] 5 cmH20 Pressure Support:  [10 cmH20] 10 cmH20 Plateau Pressure:  [22 cmH20-25 cmH20] 22 cmH20   Intake/Output Summary (Last 24 hours) at 11/06/2021 1754 Last data filed at 11/06/2021 1700 Gross per 24 hour  Intake 2388.61 ml  Output 850 ml  Net 1538.61 ml   Filed Weights   11/17/2021 1634 11/04/21 0645  Weight: 59 kg 58.5 kg   Physical Exam: General: 66 year old male patient appears chronically ill and malnourished currently sedated on propofol and fentanyl infusion HEENT normocephalic atraumatic does have temporal wasting orally intubated Pulmonary: Some scattered rhonchi on the right, left clear, Portable chest x-ray: Endotracheal tube in satisfactory position, overall worse aeration, lateral patchy airspace disease, however also with worsening right-sided effusion/reaccumulation Cardiac: Regular rate and rhythm Abdomen slightly firm, hypoactive bowel sounds Extremities trace lower extremity edema brisk capillary refill Neuro heavily sedated currently GU clear yellow  Resolved Hospital Problem list   Hyponatremia   Assessment & Plan:  Principal Problem:   Stage IV adenocarcinoma of lung, right (HCC) Active Problems:   Malignant pleural effusion   Acute respiratory failure (HCC)   Septic shock due to undetermined organism (HCC)   Acute metabolic encephalopathy   AKI (acute kidney injury) ([place])   Lactic acid acidosis   TOBACCO ABUSE   Depression   Essential hypertension   Pulmonary nodule   Multiple lesions of metastatic malignancy (HCC)   Leukocytosis   Hypoalbuminemia due to protein-calorie malnutrition (HCC)   Failure to thrive  in adult   Acute metabolic  encephalopathy s/p  respiratory arrest & complicated by sepsis Plan RASS goal -1 to -2 Continuing propofol and fentanyl Add Seroquel via tube Per family  has a fairly significant alcohol history, will also add Librium taper as we get closer to weaning again  Acute hypoxemic respiratory failure secondary to suspected aspiration, post-obstructive pna and malignant right pleural effusion Plan Continuing full ventilator support PAD protocol RASS goal -2 VAP bundle IV Lasix x1 I think his hypotension is more situational versus sepsis Ideally would need Pleurx catheter, however I am worried about his social situation and whether or not this is a realistic intervention for him to handle in the outpatient setting, will likely need at least repeat thoracentesis if Pleurx not placed in order to get him off the ventilator  Septic shock secondary unknown cause, post-obstructive pna vs UTI Rising leukocytosis -all cultures neg to date Plan Day #5 antibiotics, no cultures to date.  Planning for stop date vancomycin 9/18, continuing cefepime for now  Drug-related hypotension Plan Continue norepinephrine for mean arterial pressure very 65  Non-oliguric AKI 2/2 septic shock Baseline Cr under 1.0 prior to respiratory arrest and shock.  Normalizing Plan Avoid hypotension Strict intake output A.m. chemistry Keep Foley catheter for another 24 hours  Anion gap metabolic acidosis 2/2 septic shock Lactate persistently staying around 2.2-2.3, however serum bicarbonate slowly improving and renal function normalizing plan Continue hemodynamic support Check LFTs in the morning  Fluid and Electrolyte imbalance: Hypokalemia and hyperchloremia hypophosphatemia Plan Replace potassium and phosphate Add free water via tube  Metastatic adenocarcinoma w/ mets to pleural space and LN Plan Family updated, consult heme-onc when hemodynamically stable and out of the intensive care  Abdominal pain/Constipation CT A/P with no obstruction or source of infection. Pain possibly secondary to metastatic disease. Continue OG tube placement. Start TF when able. Surgery  evaluated  do not suspect an acute abdomen.  Plan Continue tube feeds  Hyperglycemia Plan Scale insulin for glucose 140-180  Best Practice (right click and "Reselect all SmartList Selections" daily)   Diet/type: NPO, OG tube in place DVT prophylaxis: Subcu heparin GI prophylaxis: PPI Lines: Central line, a line Foley:  placed 11/03/21 Code Status:  full code Last date of multidisciplinary goals of care discussion [N/A] -discussed with sister on 9/13  Critical care time 30 minutes  Erick Colace ACNP-BC [place] Pager # [phone] OR # [phone] if no answer

## 2021-11-06 NOTE — Progress Notes (Signed)
Nutrition Follow-up  DOCUMENTATION CODES:   Underweight  INTERVENTION:   Initiate tube feeding via OG tube: Vital AF 1.2 at 20 ml/h  If tolerating TF well tomorrow, recommend increase by 10 ml every 4 hours to goal rate of 65 ml/h (1560 ml per day) to provide 1872 kcal, 117 gm protein, 1265 ml free water daily.  MVI with minerals 1 tablet crushed and given via OG tube once daily.   NUTRITION DIAGNOSIS:   Increased nutrient needs related to chronic illness as evidenced by estimated needs. - Ongoing   GOAL:   Patient will meet greater than or equal to 90% of their needs - Unmet  MONITOR:   Vent status, TF tolerance, Labs  REASON FOR ASSESSMENT:   Consult Enteral/tube feeding initiation and management  ASSESSMENT:   66 y.o. male presented to the ED with coughing up blood. PMH includes COPD, tobacco use, HTN, and GERD. Pt admitted with new found lung mass.  9/08 - Thoracentesis, removed 2 L 9/10 - Respiratory arrest; Intubated 9/11 - Thoracentesis, removed 1.1 L  Received MD Consult for initiation of trickle tube feedings. OG tube in place with tip in the stomach.   Patient remains intubated on ventilator support. MV: 9.8 L/min Temp (24hrs), Avg:98.7 F (37.1 C), Min:97.9 F (36.6 C), Max:99.3 F (37.4 C)  Labs reviewed.  CBG: [phone]  Medications reviewed and include Protonix, Miralax, Precedex, fentanyl, Levophed, vasopressin.   No OG output recorded x 24 hours UOP 850 ml x 24 hours I/O + 5.8 L since admission  Intake/Output Summary (Last 24 hours) at 11/06/2021 0950 Last data filed at 11/06/2021 0900 Gross per 24 hour  Intake 2965.52 ml  Output 800 ml  Net 2165.52 ml     Diet Order:   Diet Order             Diet NPO time specified  Diet effective now                   EDUCATION NEEDS:   Not appropriate for education at this time  Skin:  Skin Assessment: Reviewed RN Assessment  Last BM:  9/13 type 5  Height:   Ht Readings from  Last 1 Encounters:  11/03/21 6\' 2"  (1.88 m)   Weight:   Wt Readings from Last 1 Encounters:  11/04/21 58.5 kg   Ideal Body Weight:  86.4 kg  BMI:  Body mass index is 16.56 kg/m.  Estimated Nutritional Needs:  Kcal:  1800-2000 Protein:  90-105 grams Fluid:  >/= 1.8 L   Lucas Mallow RD, LDN, CNSC Please refer to Amion for contact information.

## 2021-11-07 ENCOUNTER — Inpatient Hospital Stay (HOSPITAL_COMMUNITY): Payer: Medicare Other

## 2021-11-07 DIAGNOSIS — C3491 Malignant neoplasm of unspecified part of right bronchus or lung: Secondary | ICD-10-CM

## 2021-11-07 DIAGNOSIS — I952 Hypotension due to drugs: Secondary | ICD-10-CM

## 2021-11-07 LAB — CYTOLOGY - NON PAP

## 2021-11-07 LAB — POCT I-STAT 7, (LYTES, BLD GAS, ICA,H+H)
Acid-base deficit: 6 mmol/L — ABNORMAL HIGH (ref 0.0–2.0)
Bicarbonate: 18 mmol/L — ABNORMAL LOW (ref 20.0–28.0)
Calcium, Ion: 1.14 mmol/L — ABNORMAL LOW (ref 1.15–1.40)
HCT: 29 % — ABNORMAL LOW (ref 39.0–52.0)
Hemoglobin: 9.9 g/dL — ABNORMAL LOW (ref 13.0–17.0)
O2 Saturation: 89 %
Potassium: 3.3 mmol/L — ABNORMAL LOW (ref 3.5–5.1)
Sodium: 144 mmol/L (ref 135–145)
TCO2: 19 mmol/L — ABNORMAL LOW (ref 22–32)
pCO2 arterial: 28.5 mmHg — ABNORMAL LOW (ref 32–48)
pH, Arterial: 7.408 (ref 7.35–7.45)
pO2, Arterial: 55 mmHg — ABNORMAL LOW (ref 83–108)

## 2021-11-07 LAB — CBC WITH DIFFERENTIAL/PLATELET
Abs Immature Granulocytes: 3.9 10*3/uL — ABNORMAL HIGH (ref 0.00–0.07)
Basophils Absolute: 0.5 10*3/uL — ABNORMAL HIGH (ref 0.0–0.1)
Basophils Relative: 1 %
Eosinophils Absolute: 0.5 10*3/uL (ref 0.0–0.5)
Eosinophils Relative: 1 %
HCT: 30.6 % — ABNORMAL LOW (ref 39.0–52.0)
Hemoglobin: 10.5 g/dL — ABNORMAL LOW (ref 13.0–17.0)
Lymphocytes Relative: 0 %
Lymphs Abs: 0 10*3/uL — ABNORMAL LOW (ref 0.7–4.0)
MCH: 31.8 pg (ref 26.0–34.0)
MCHC: 34.3 g/dL (ref 30.0–36.0)
MCV: 92.7 fL (ref 80.0–100.0)
Metamyelocytes Relative: 2 %
Monocytes Absolute: 2.9 10*3/uL — ABNORMAL HIGH (ref 0.1–1.0)
Monocytes Relative: 6 %
Myelocytes: 4 %
Neutro Abs: 40.6 10*3/uL — ABNORMAL HIGH (ref 1.7–7.7)
Neutrophils Relative %: 84 %
Platelets: 186 10*3/uL (ref 150–400)
Promyelocytes Relative: 2 %
RBC: 3.3 MIL/uL — ABNORMAL LOW (ref 4.22–5.81)
RDW: 13.8 % (ref 11.5–15.5)
WBC: 48.3 10*3/uL — ABNORMAL HIGH (ref 4.0–10.5)
nRBC: 0 % (ref 0.0–0.2)
nRBC: 0 /100 WBC

## 2021-11-07 LAB — GLUCOSE, CAPILLARY
Glucose-Capillary: 120 mg/dL — ABNORMAL HIGH (ref 70–99)
Glucose-Capillary: 143 mg/dL — ABNORMAL HIGH (ref 70–99)
Glucose-Capillary: 147 mg/dL — ABNORMAL HIGH (ref 70–99)
Glucose-Capillary: 155 mg/dL — ABNORMAL HIGH (ref 70–99)
Glucose-Capillary: 173 mg/dL — ABNORMAL HIGH (ref 70–99)
Glucose-Capillary: 181 mg/dL — ABNORMAL HIGH (ref 70–99)

## 2021-11-07 LAB — BASIC METABOLIC PANEL
Anion gap: 9 (ref 5–15)
BUN: 37 mg/dL — ABNORMAL HIGH (ref 8–23)
CO2: 19 mmol/L — ABNORMAL LOW (ref 22–32)
Calcium: 7.5 mg/dL — ABNORMAL LOW (ref 8.9–10.3)
Chloride: 115 mmol/L — ABNORMAL HIGH (ref 98–111)
Creatinine, Ser: 1.01 mg/dL (ref 0.61–1.24)
GFR, Estimated: >60 mL/min (ref >60)
Glucose, Bld: 153 mg/dL — ABNORMAL HIGH (ref 70–99)
Potassium: 3.4 mmol/L — ABNORMAL LOW (ref 3.5–5.1)
Sodium: 143 mmol/L (ref 135–145)

## 2021-11-07 LAB — HEMOGLOBIN A1C
Hgb A1c MFr Bld: 5.7 % — ABNORMAL HIGH (ref 4.8–5.6)
Mean Plasma Glucose: 116.89 mg/dL

## 2021-11-07 LAB — PHOSPHORUS
Phosphorus: 2 mg/dL — ABNORMAL LOW (ref 2.5–4.6)
Phosphorus: 3.2 mg/dL (ref 2.5–4.6)

## 2021-11-07 LAB — MAGNESIUM
Magnesium: 2.3 mg/dL (ref 1.7–2.4)
Magnesium: 2.3 mg/dL (ref 1.7–2.4)

## 2021-11-07 LAB — PROCALCITONIN: Procalcitonin: 12.04 ng/mL

## 2021-11-07 LAB — LACTIC ACID, PLASMA: Lactic Acid, Venous: 2.3 mmol/L (ref 0.5–1.9)

## 2021-11-07 LAB — TROPONIN I (HIGH SENSITIVITY)
Troponin I (High Sensitivity): 31 ng/L — ABNORMAL HIGH (ref <18)
Troponin I (High Sensitivity): 28 ng/L — ABNORMAL HIGH (ref <18)

## 2021-11-07 MED ORDER — VITAL AF 1.2 CAL PO LIQD
1000.0000 mL | ORAL | Status: DC
  Administered 2021-11-11: 1000 mL

## 2021-11-07 MED ORDER — ORAL CARE MOUTH RINSE
15.0000 mL | OROMUCOSAL | Status: DC
  Administered 2021-11-07: 15 mL via OROMUCOSAL

## 2021-11-07 MED ORDER — INSULIN ASPART 100 UNIT/ML IJ SOLN
0.0000 [IU] | INTRAMUSCULAR | Status: DC
  Administered 2021-11-07: 3 [IU] via SUBCUTANEOUS
  Administered 2021-11-08: 2 [IU] via SUBCUTANEOUS
  Administered 2021-11-08: 3 [IU] via SUBCUTANEOUS
  Administered 2021-11-08 (×2): 2 [IU] via SUBCUTANEOUS
  Administered 2021-11-08: 3 [IU] via SUBCUTANEOUS
  Administered 2021-11-09 – 2021-11-12 (×4): 2 [IU] via SUBCUTANEOUS
  Administered 2021-11-13 – 2021-11-14 (×10): 3 [IU] via SUBCUTANEOUS
  Administered 2021-11-14: 2 [IU] via SUBCUTANEOUS
  Administered 2021-11-14 – 2021-11-15 (×3): 3 [IU] via SUBCUTANEOUS

## 2021-11-07 MED ORDER — ORAL CARE MOUTH RINSE: 15.0000 mL | OROMUCOSAL | Status: DC | PRN

## 2021-11-07 MED ORDER — DOCUSATE SODIUM 50 MG/5ML PO LIQD
100.0000 mg | Freq: Two times a day (BID) | ORAL | Status: DC
  Administered 2021-11-07 – 2021-11-15 (×12): 100 mg
  Filled 2021-11-07 (×13): qty 10

## 2021-11-07 MED ORDER — PROPOFOL 1000 MG/100ML IV EMUL
0.0000 ug/kg/min | INTRAVENOUS | Status: DC
  Administered 2021-11-07: 10 ug/kg/min via INTRAVENOUS
  Administered 2021-11-07: 20 ug/kg/min via INTRAVENOUS
  Administered 2021-11-08 (×3): 35 ug/kg/min via INTRAVENOUS
  Administered 2021-11-09: 30 ug/kg/min via INTRAVENOUS
  Administered 2021-11-09: 15 ug/kg/min via INTRAVENOUS
  Administered 2021-11-09: 35 ug/kg/min via INTRAVENOUS
  Administered 2021-11-09: 15 ug/kg/min via INTRAVENOUS
  Administered 2021-11-10: 30 ug/kg/min via INTRAVENOUS
  Administered 2021-11-10: 10 ug/kg/min via INTRAVENOUS
  Filled 2021-11-07 (×10): qty 100

## 2021-11-07 MED ORDER — SODIUM CHLORIDE 0.9% FLUSH
10.0000 mL | Freq: Three times a day (TID) | INTRAVENOUS | Status: DC
  Administered 2021-11-07 – 2021-11-15 (×24): 10 mL via INTRAPLEURAL

## 2021-11-07 MED ORDER — POTASSIUM CHLORIDE 20 MEQ PO PACK
20.0000 meq | PACK | Freq: Once | ORAL | Status: AC
  Administered 2021-11-07: 20 meq
  Filled 2021-11-07: qty 1

## 2021-11-07 MED ORDER — LACTATED RINGERS IV BOLUS
500.0000 mL | Freq: Once | INTRAVENOUS | Status: AC
  Administered 2021-11-07: 500 mL via INTRAVENOUS

## 2021-11-07 MED ORDER — ORAL CARE MOUTH RINSE
15.0000 mL | OROMUCOSAL | Status: DC
  Administered 2021-11-07 – 2021-11-15 (×95): 15 mL via OROMUCOSAL

## 2021-11-07 MED ORDER — POTASSIUM PHOSPHATES 15 MMOLE/5ML IV SOLN
15.0000 mmol | Freq: Once | INTRAVENOUS | Status: AC
  Administered 2021-11-07: 15 mmol via INTRAVENOUS
  Filled 2021-11-07: qty 5

## 2021-11-07 MED ORDER — FREE WATER: 200.0000 mL | Freq: Four times a day (QID) | Status: DC

## 2021-11-07 MED ORDER — VANCOMYCIN HCL IN DEXTROSE 1-5 GM/200ML-% IV SOLN
1000.0000 mg | INTRAVENOUS | Status: DC
  Administered 2021-11-07: 1000 mg via INTRAVENOUS
  Filled 2021-11-07: qty 200

## 2021-11-07 MED ORDER — POTASSIUM CHLORIDE 20 MEQ PO PACK
40.0000 meq | PACK | Freq: Once | ORAL | Status: AC
  Administered 2021-11-07: 40 meq
  Filled 2021-11-07: qty 2

## 2021-11-07 MED ORDER — FREE WATER
200.0000 mL | Status: DC
  Administered 2021-11-07 – 2021-11-08 (×8): 200 mL

## 2021-11-07 MED ORDER — QUETIAPINE FUMARATE 50 MG PO TABS
50.0000 mg | ORAL_TABLET | Freq: Two times a day (BID) | ORAL | Status: DC
  Administered 2021-11-07 – 2021-11-15 (×14): 50 mg via NASOGASTRIC
  Filled 2021-11-07 (×16): qty 1

## 2021-11-07 MED ORDER — FUROSEMIDE 10 MG/ML IJ SOLN
40.0000 mg | Freq: Once | INTRAMUSCULAR | Status: AC
  Administered 2021-11-07: 40 mg via INTRAVENOUS
  Filled 2021-11-07: qty 4

## 2021-11-07 NOTE — Procedures (Signed)
Insertion of Chest Tube Procedure Note  Duane Richardson  [id number]  1955/10/14  Date:11/07/21  Time:11:12 AM    Provider Performing: Clementeen Graham   Procedure: Chest Tube Insertion [phone])  Indication(s) Effusion  Consent Risks of the procedure as well as the alternatives and risks of each were explained to the patient and/or caregiver.  Consent for the procedure was obtained and is signed in the bedside chart  Anesthesia Topical only with 1% lidocaine    Time Out Verified patient identification, verified procedure, site/side was marked, verified correct patient position, special equipment/implants available, medications/allergies/relevant history reviewed, required imaging and test results available.   Sterile Technique Maximal sterile technique including full sterile barrier drape, hand hygiene, sterile gown, sterile gloves, mask, hair covering, sterile ultrasound probe cover (if used).   Procedure Description Ultrasound used to identify appropriate pleural anatomy for placement and overlying skin marked. Area of placement cleaned and draped in sterile fashion.  A 12 French pigtail pleural catheter was placed into the right pleural space using Seldinger technique. Appropriate return of fluid was obtained.  The tube was connected to atrium and placed on -20 cm H2O wall suction.   Complications/Tolerance None; patient tolerated the procedure well. Chest X-ray is ordered to verify placement.   EBL Minimal  Specimen(s) fluid   Erick Colace ACNP-BC [place] Pager # [phone] OR # [phone] if no answer

## 2021-11-07 NOTE — Progress Notes (Signed)
Pharmacy Antibiotic Note  Duane Richardson is a 66 y.o. male admitted on 10/27/2021 with pneumonia.  Pharmacy has been consulted for Cefepime and Vancomcyin dosing. WBC elevated at 44.3. Afebrile. SCr up to 1.28. Levophed at 5.   Patient has remained afebrile in the last 24 hours and vasopressor requirements have continued to decrease, however repeat CXR shows bibasilar atelectasis and infiltrates and WBC increased 44.3 > 48.3. MRSA PCR from 9/10 was negative, not repeating at this time.   Renal function has improved 1.28 >> 1.01. Calculated CrCl of 60 mL/min.  Plan: Continue Cefepime 2g IV every 12 hours based on current renal function.  Increase vancomycin to 1000 mg IV Q24h (eAUC 415, Scr 1.01) Monitor renal function, culture results, and clinical status.  F/u LOT  Height: 6\' 2"  (188 cm) Weight: 61.2 kg (134 lb 14.7 oz) IBW/kg (Calculated) : 82.2  Temp (24hrs), Avg:98.6 F (37 C), Min:97.3 F (36.3 C), Max:99.4 F (37.4 C)  Recent Labs  Lab 11/03/21 1236 11/03/21 1821 11/04/21 0539 11/04/21 1015 11/05/21 0529 11/05/21 0531 11/06/21 0016 11/06/21 0017 11/06/21 0643 11/06/21 0924 11/06/21 1656 11/07/21 0342  WBC 34.3*  --  37.0*  --  39.1*  --  44.3*  --   --   --   --  48.3*  CREATININE 1.26*  --  1.77*  --  1.62*  --  1.28*  --   --   --   --  1.01  LATICACIDVEN 6.4*   < > 4.5*   < >  --    < >  --  2.2* 2.2* 2.0* 2.2* 2.3*   < > = values in this interval not displayed.     Estimated Creatinine Clearance: 60.6 mL/min (by C-G formula based on SCr of 1.01 mg/dL).    No Known Allergies  Antimicrobials this admission: Cefepime 9/10 >>  Vancomycin 9/10 >> 9/12, 9/13 >>  Dose adjustments this admission: 9/14 Vancomycin increased to 1000 mg Q24 (Scr improved 1.28 > 1.01)  Microbiology results: 9/8 Pleural fluid: NGTD x 2d  9/10 BCx: NGTD x 2d   9/10 Sputum: not collected 9/10 MRSA PCR: negative  9/13: Resp cx: NGTD  Thank you for allowing pharmacy to be a part of  this patient's care.  Louanne Belton, PharmD, [facility] PGY1 Pharmacy Resident 11/07/2021 10:30 AM

## 2021-11-07 NOTE — Progress Notes (Signed)
Hypoglycemic Event  CBG: 64  Treatment: D50 25 mL (12.5 gm)  Symptoms: None  Follow-up CBG: [id number] CBG Result:143  Possible Reasons for Event: Unknown  Comments/MD notified: Evette Doffing MD    Chaney Born

## 2021-11-07 NOTE — Procedures (Signed)
Cortrak  Person Inserting Tube:  Alroy Dust, Arlone Lenhardt L, RD Tube Type:  Cortrak - 43 inches Tube Size:  10 Tube Location:  Left nare Secured by: Bridle Technique Used to Measure Tube Placement:  Marking at nare/corner of mouth Cortrak Secured At:  80 cm   Cortrak Tube Team Note:  Consult received to place a Cortrak feeding tube.   X-ray is required, abdominal x-ray has been ordered by the Cortrak team. Please confirm tube placement before using the Cortrak tube.   If the tube becomes dislodged please keep the tube and contact the Cortrak team at [email or web address] ([email or web address]) for replacement.  If after hours and replacement cannot be delayed, place a NG tube and confirm placement with an abdominal x-ray.    Hermina Barters RD, LDN Clinical Dietitian See Shea Evans for contact information.

## 2021-11-07 NOTE — Plan of Care (Signed)

## 2021-11-07 NOTE — Assessment & Plan Note (Signed)
S/p thora on 9/7 and 9/12. Right chest tube placed 9/14 to assist with weaning from vent

## 2021-11-07 NOTE — Progress Notes (Signed)
[facility] ADULT ICU REPLACEMENT PROTOCOL   The patient does apply for the [facility] Adult ICU Electrolyte Replacment Protocol based on the criteria listed below:   1.Exclusion criteria: TCTS patients, ECMO patients, and Dialysis patients 2. Is GFR >/= 30 ml/min? Yes.    Patient's GFR today is >60 3. Is SCr </= 2? Yes.   Patient's SCr is 1.01 mg/dL 4. Did SCr increase >/= 0.5 in 24 hours? No. 5.Pt's weight >40kg  Yes.   6. Abnormal electrolyte(s):   K 3.4, Phos 2.0  7. Electrolytes replaced per protocol 8.  Call MD STAT for K+ </= 2.5, Phos </= 1, or Mag </= 1 Physician:  V. Harrie Jeans R Dashiel Bergquist 11/07/2021 4:48 AM

## 2021-11-07 NOTE — Progress Notes (Signed)
Nutrition Follow-up  DOCUMENTATION CODES:   Underweight  INTERVENTION:  Once Cortrak placed (tip in distal stomach), recommend: Advancing Vital 1.2 to 30ml and increase by 10ml every 8 hours until a goal rate of 65 ml/hr (1560 ml per day) to provide 1872 kcal, 117 gm protein, 1265 ml free water daily. MVI with minerals 1 tablet crushed and given via tube  NUTRITION DIAGNOSIS:   Increased nutrient needs related to chronic illness as evidenced by estimated needs.  ongoing  GOAL:   Patient will meet greater than or equal to 90% of their needs  Goal not met  MONITOR:   Vent status, TF tolerance, Labs  REASON FOR ASSESSMENT:   Consult Enteral/tube feeding initiation and management  ASSESSMENT:   66 y.o. male presented to the ED with coughing up blood. PMH includes COPD, tobacco use, HTN, and GERD. Pt admitted with new found lung mass.  9/08 - Thoracentesis, removed 2 L 9/10 - Respiratory arrest; Intubated 9/11 - Thoracentesis, removed 1.1 L  9/14 - chest tube placed; Cortrak placed-TF advanced  Pt with worsening hypoxemia with CXR findings of recurrent R pleural effusion.  Ileus resolving. Pt with absence of emesis and having bowel movements with trickle tube feeding. Spoke with NP, agree to advance TF today.   Patient is currently intubated on ventilator support MV: 12.7 L/min Temp (24hrs), Avg:98.7 F (37.1 C), Min:97.9 F (36.6 C), Max:99.4 F (37.4 C) MAP 68  Propofol: 7.34 ml/hr (194 kcal)  Edema: mild pitting generalized, non-pitting BUE/BLE  Medications: colace, BID, lasix, MVI, protonix, miralax daily, klor con IV drips: fentanyl, levo @10.31ml/hr, Kphos, propofol, vanco  Labs: potassium 3.4 (L), BUN 37, ionized Ca 1.14, phos 2.0 (L), CBG's 90-155 x24 hours  UOP: 1225ml x24 hours I/O's: +6416ml since admission  Diet Order:   Diet Order             Diet NPO time specified  Diet effective now                   EDUCATION NEEDS:   Not  appropriate for education at this time  Skin:  Skin Assessment: Reviewed RN Assessment  Last BM:  9/13 (type 5-small)  Height:   Ht Readings from Last 1 Encounters:  11/03/21 6' 2" (1.88 m)    Weight:   Wt Readings from Last 1 Encounters:  11/07/21 61.2 kg    Ideal Body Weight:  86.4 kg  BMI:  Body mass index is 17.32 kg/m.  Estimated Nutritional Needs:   Kcal:  1800-2000  Protein:  90-105 grams  Fluid:  >/= 1.8 L  Allie , RDN, LDN Clinical Nutrition 

## 2021-11-07 NOTE — Progress Notes (Cosign Needed)
NAME:  Duane Richardson, MRN:  [id number], DOB:  01/31/1956, LOS: 7 ADMISSION DATE:  11/14/2021, CONSULTATION DATE:  11/10/2021 REFERRING MD:  Terrilee Croak, MD CHIEF COMPLAINT:  Hemoptysis, Respiratory arrest   History of Present Illness:  66 year old with tobacco abuse with COPD who presented with cough, hemoptysis, weight loss, fatigue. Imaging markedly abnormal and PCCM consulted. CTA neg for PE with large right pleural effusion and RUL mass measuring 5.3 x 3.8 cm with mass effect of right mainstem, BI, RML and RLLL with pleural based nodules, centrilobular emphysema. Had thoracentesis on 9/8 with 2L exudative bloody fluid 275.  CT A/P with probable hepatic, left adrenal and left perinrenal mets. Underwent EBUS 9/8 with sampling of 4R. He remained in the hospital for work-up for abdominal pain and constipation  9/10 patient had worsening respiratory status requiring 15L O2 with concern for aspiration. Rapid response called and patient had agonal breathing. Pulses reported intact. He was intubated on the floor. PCCM consulted for transfer.  Pertinent  Medical History  COPD DJD post THA HTN HLD  Significant Hospital Events: Including procedures, antibiotic start and stop dates in addition to other pertinent events   9/7 Admitted and thoracentesis- 9/8 EBUS  9/10 Respiratory arrest and intubated on floor 9/11 Central line placed 9/12 Bedside US guided right pleural thoracentesis. EEG 9_) seizures  9/13 cytology back:->pleural fluid + malignant cells Adenocarcinoma; EBUS: Malignant cells consistent with lung adenocarcinoma from 4R LN; still pressor dependent.  Family updated by Dr. Loanne Drilling.  ABIs completed normal 9/14 pleural effusion reaccumulated.  Agitated Precedex switch back to propofol. Small bore chest tube placed in right pleural space and connected to -20 cm H20 wall suction.   Interim History / Subjective:  Sedated on ventilator Objective   Blood pressure (!) 89/66, pulse 85,  temperature 97.9 F (36.6 C), temperature source Oral, resp. rate 20, height 6\' 2"  (1.88 m), weight 61.2 kg, SpO2 100 %.    Vent Mode: PRVC FiO2 (%):  [40 %-60 %] 60 % Set Rate:  [20 bmp] 20 bmp Vt Set:  [640 mL] 640 mL PEEP:  [5 cmH20] 5 cmH20 Plateau Pressure:  [22 cmH20-25 cmH20] 24 cmH20   Intake/Output Summary (Last 24 hours) at 11/07/2021 1216 Last data filed at 11/07/2021 1200 Gross per 24 hour  Intake 1678.65 ml  Output 875 ml  Net 803.65 ml   Filed Weights   11/16/2021 1634 11/04/21 0645 11/07/21 0345  Weight: 59 kg 58.5 kg 61.2 kg   Physical Exam: General: 66 year old male patient appears chronically ill and malnourished currently sedated on propofol and fentanyl infusion HEENT normocephalic atraumatic does have temporal wasting orally intubated Pulmonary: Some scattered rhonchi on the right, left clear, Portable chest x-ray: Endotracheal tube in satisfactory position, overall worse aeration, lateral patchy airspace disease, however also with worsening right-sided effusion/reaccumulation Cardiac: Regular rate and rhythm Abdomen slightly firm, hypoactive bowel sounds Extremities trace lower extremity edema brisk capillary refill Neuro heavily sedated currently GU clear yellow  Resolved Hospital Problem list   Hyponatremia   Assessment & Plan:  Principal Problem:   Stage IV adenocarcinoma of lung, right (HCC) Active Problems:   TOBACCO ABUSE   Depression   Essential hypertension   Pulmonary nodule   Malignant pleural effusion   Multiple lesions of metastatic malignancy (HCC)   Leukocytosis   Hypoalbuminemia due to protein-calorie malnutrition (HCC)   Failure to thrive in adult   Acute respiratory failure (HCC)   Acute metabolic encephalopathy   Septic shock  due to undetermined organism ([place])   AKI (acute kidney injury) ([place])   Lactic acid acidosis   Hypotension due to drugs   Acute metabolic  encephalopathy s/p  respiratory arrest & complicated by  sepsis Plan RASS goal -1 to -2 Continuing propofol and fentanyl Add Seroquel via tube Per family has a fairly significant alcohol history, will also add Librium taper as we get closer to weaning again  Acute hypoxemic respiratory failure secondary to suspected aspiration, post-obstructive pna and malignant right pleural effusion Plan Continuing full ventilator support PAD protocol RASS goal -2 VAP bundle IV Lasix x1  Continue small bore chest tube to water suction.   Septic shock secondary unknown cause, post-obstructive pna vs UTI Rising leukocytosis -all cultures neg to date Plan Day #5 antibiotics, no cultures to date.  Planning for stop date vancomycin 9/18, continuing cefepime for now  Drug-related hypotension Plan Continue norepinephrine for mean arterial pressure of 65  Non-oliguric AKI 2/2 septic shock Baseline Cr under 1.0 prior to respiratory arrest and shock.  Normalizing Plan Avoid hypotension Strict intake output A.m. chemistry Keep Foley catheter for another 24 hours  Anion gap metabolic acidosis 2/2 septic shock Lactate persistently staying around 2.2-2.3, however serum bicarbonate slowly improving and renal function normalizing plan Continue hemodynamic support Check LFTs in the morning  Fluid and Electrolyte imbalance: Hypokalemia and hyperchloremia hypophosphatemia Plan Replace potassium and phosphate Add free water via tube  Metastatic adenocarcinoma w/ mets to pleural space and LN Plan Family updated, consult heme-onc when hemodynamically stable and out of the intensive care  Abdominal pain/Constipation CT Abd/Pel with no obstruction or source of infection. Pain possibly secondary to metastatic disease. Continue OG tube placement. Start TF when able. Surgery evaluated  do not suspect an acute abdomen.  Plan Continue tube feeds  Hyperglycemia Plan Scale insulin for glucose 140-180  Best Practice (right click and "Reselect all SmartList  Selections" daily)   Diet/type: NPO, OG tube in place DVT prophylaxis: Subcu heparin GI prophylaxis: PPI Lines: Central line, a line Foley:  placed 11/03/21 Code Status:  full code Last date of multidisciplinary goals of care discussion [N/A] -discussed with sister on 9/13  Critical care time 68 minutes  Audie Box, [place] Pager # [phone] OR # [phone] if no answer

## 2021-11-08 ENCOUNTER — Inpatient Hospital Stay (HOSPITAL_COMMUNITY): Payer: Medicare Other

## 2021-11-08 DIAGNOSIS — C3491 Malignant neoplasm of unspecified part of right bronchus or lung: Secondary | ICD-10-CM | POA: Diagnosis not present

## 2021-11-08 LAB — CULTURE, BLOOD (ROUTINE X 2)
Culture: NO GROWTH
Culture: NO GROWTH
Special Requests: ADEQUATE

## 2021-11-08 LAB — TECHNOLOGIST SMEAR REVIEW

## 2021-11-08 LAB — BASIC METABOLIC PANEL
Anion gap: 8 (ref 5–15)
BUN: 32 mg/dL — ABNORMAL HIGH (ref 8–23)
CO2: 20 mmol/L — ABNORMAL LOW (ref 22–32)
Calcium: 7.3 mg/dL — ABNORMAL LOW (ref 8.9–10.3)
Chloride: 113 mmol/L — ABNORMAL HIGH (ref 98–111)
Creatinine, Ser: 0.92 mg/dL (ref 0.61–1.24)
GFR, Estimated: >60 mL/min (ref >60)
Glucose, Bld: 185 mg/dL — ABNORMAL HIGH (ref 70–99)
Potassium: 4.2 mmol/L (ref 3.5–5.1)
Sodium: 141 mmol/L (ref 135–145)

## 2021-11-08 LAB — CBC WITH DIFFERENTIAL/PLATELET
Abs Immature Granulocytes: 0 10*3/uL (ref 0.00–0.07)
Basophils Absolute: 0 10*3/uL (ref 0.0–0.1)
Basophils Relative: 0 %
Eosinophils Absolute: 2 10*3/uL — ABNORMAL HIGH (ref 0.0–0.5)
Eosinophils Relative: 3 %
HCT: 27.6 % — ABNORMAL LOW (ref 39.0–52.0)
Hemoglobin: 9.4 g/dL — ABNORMAL LOW (ref 13.0–17.0)
Lymphocytes Relative: 2 %
Lymphs Abs: 1.3 10*3/uL (ref 0.7–4.0)
MCH: 31.9 pg (ref 26.0–34.0)
MCHC: 34.1 g/dL (ref 30.0–36.0)
MCV: 93.6 fL (ref 80.0–100.0)
Monocytes Absolute: 2 10*3/uL — ABNORMAL HIGH (ref 0.1–1.0)
Monocytes Relative: 3 %
Neutro Abs: 61 10*3/uL — ABNORMAL HIGH (ref 1.7–7.7)
Neutrophils Relative %: 92 %
Platelets: 133 10*3/uL — ABNORMAL LOW (ref 150–400)
RBC: 2.95 MIL/uL — ABNORMAL LOW (ref 4.22–5.81)
RDW: 14.4 % (ref 11.5–15.5)
WBC: 66.3 10*3/uL (ref 4.0–10.5)
nRBC: 0.1 % (ref 0.0–0.2)

## 2021-11-08 LAB — TSH: TSH: 3.532 u[IU]/mL (ref 0.350–4.500)

## 2021-11-08 LAB — CORTISOL: Cortisol, Plasma: 15.9 ug/dL

## 2021-11-08 LAB — LACTIC ACID, PLASMA
Lactic Acid, Venous: 2.2 mmol/L (ref 0.5–1.9)
Lactic Acid, Venous: 2 mmol/L (ref 0.5–1.9)

## 2021-11-08 LAB — HEPATIC FUNCTION PANEL
ALT: 10 U/L (ref 0–44)
AST: 25 U/L (ref 15–41)
Albumin: <1.5 g/dL — ABNORMAL LOW (ref 3.5–5.0)
Alkaline Phosphatase: 63 U/L (ref 38–126)
Bilirubin, Direct: 0.3 mg/dL — ABNORMAL HIGH (ref 0.0–0.2)
Indirect Bilirubin: 0.3 mg/dL (ref 0.3–0.9)
Total Bilirubin: 0.6 mg/dL (ref 0.3–1.2)
Total Protein: 4.4 g/dL — ABNORMAL LOW (ref 6.5–8.1)

## 2021-11-08 LAB — PHOSPHORUS
Phosphorus: 1.6 mg/dL — ABNORMAL LOW (ref 2.5–4.6)
Phosphorus: 4.7 mg/dL — ABNORMAL HIGH (ref 2.5–4.6)

## 2021-11-08 LAB — GLUCOSE, CAPILLARY
Glucose-Capillary: 176 mg/dL — ABNORMAL HIGH (ref 70–99)
Glucose-Capillary: 146 mg/dL — ABNORMAL HIGH (ref 70–99)
Glucose-Capillary: 187 mg/dL — ABNORMAL HIGH (ref 70–99)
Glucose-Capillary: 96 mg/dL (ref 70–99)
Glucose-Capillary: 130 mg/dL — ABNORMAL HIGH (ref 70–99)
Glucose-Capillary: 81 mg/dL (ref 70–99)

## 2021-11-08 LAB — PROCALCITONIN: Procalcitonin: 8.45 ng/mL

## 2021-11-08 LAB — LIPASE, BLOOD: Lipase: 16 U/L (ref 11–51)

## 2021-11-08 LAB — BODY FLUID CULTURE W GRAM STAIN: Culture: NO GROWTH

## 2021-11-08 LAB — MAGNESIUM
Magnesium: 1.8 mg/dL (ref 1.7–2.4)
Magnesium: 2.2 mg/dL (ref 1.7–2.4)

## 2021-11-08 LAB — CULTURE, RESPIRATORY W GRAM STAIN

## 2021-11-08 LAB — TRIGLYCERIDES: Triglycerides: 272 mg/dL — ABNORMAL HIGH (ref <150)

## 2021-11-08 MED ORDER — HYDROCORTISONE SOD SUC (PF) 100 MG IJ SOLR
100.0000 mg | Freq: Two times a day (BID) | INTRAMUSCULAR | Status: DC
  Administered 2021-11-08 – 2021-11-11 (×6): 100 mg via INTRAVENOUS
  Filled 2021-11-08 (×8): qty 2

## 2021-11-08 MED ORDER — MAGNESIUM SULFATE 2 GM/50ML IV SOLN
2.0000 g | Freq: Once | INTRAVENOUS | Status: AC
  Administered 2021-11-08: 2 g via INTRAVENOUS
  Filled 2021-11-08: qty 50

## 2021-11-08 MED ORDER — SODIUM PHOSPHATES 45 MMOLE/15ML IV SOLN
45.0000 mmol | Freq: Once | INTRAVENOUS | Status: AC
  Administered 2021-11-08: 45 mmol via INTRAVENOUS
  Filled 2021-11-08: qty 15

## 2021-11-08 MED ORDER — INSULIN GLARGINE-YFGN 100 UNIT/ML ~~LOC~~ SOLN
5.0000 [IU] | Freq: Two times a day (BID) | SUBCUTANEOUS | Status: DC
  Administered 2021-11-08 – 2021-11-09 (×4): 5 [IU] via SUBCUTANEOUS
  Filled 2021-11-08 (×6): qty 0.05

## 2021-11-08 MED ORDER — PIPERACILLIN-TAZOBACTAM 3.375 G IVPB
3.3750 g | Freq: Three times a day (TID) | INTRAVENOUS | Status: DC
  Administered 2021-11-08 – 2021-11-14 (×19): 3.375 g via INTRAVENOUS
  Filled 2021-11-08 (×19): qty 50

## 2021-11-08 NOTE — Progress Notes (Signed)
[facility] ADULT ICU REPLACEMENT PROTOCOL   The patient does apply for the [facility] Adult ICU Electrolyte Replacment Protocol based on the criteria listed below:   1.Exclusion criteria: TCTS patients, ECMO patients, and Dialysis patients 2. Is GFR >/= 30 ml/min? Yes.    Patient's GFR today is >60 3. Is SCr </= 2? Yes.   Patient's SCr is 0.92 mg/dL 4. Did SCr increase >/= 0.5 in 24 hours? No. 5.Pt's weight >40kg  Yes.   6. Abnormal electrolyte(s): Phos 1.6, Mag 1.8  7. Electrolytes replaced per protocol 8.  Call MD STAT for K+ </= 2.5, Phos </= 1, or Mag </= 1 Physician:  Randie Heinz 11/08/2021 5:46 AM

## 2021-11-08 NOTE — Progress Notes (Addendum)
NAME:  Duane Richardson, MRN:  [id number], DOB:  [birth year], LOS: 8 ADMISSION DATE:  10/26/2021, CONSULTATION DATE:  11/23/2021 REFERRING MD:  Terrilee Croak, MD CHIEF COMPLAINT:  Hemoptysis, Respiratory arrest   History of Present Illness:  66 year old with tobacco abuse with COPD who presents with cough, hemoptysis, weight loss, fatigue. Imaging markedly abnormal and PCCM consulted. CTA neg for PE with large right pleural effusion and RUL mass measuring 5.3 x 3.8 cm with mass effect of right mainstem, BI, RML and RLLL with pleural based nodules, centrilobular emphysema. Had thoracentesis on 9/8 with 2L exudative bloody fluid 275.  CT A/P with probable hepatic, left adrenal and left perinrenal mets. Underwent EBUS 9/8 with sampling of 4R. He remained in the hospital for work-up for abdominal pain and constipation  9/10 patient had worsening respiratory status requiring 15L O2 with concern for aspiration. Rapid response called and patient had agonal breathing. Pulses reported intact. He was intubated on the floor. PCCM consulted for transfer.  Pertinent  Medical History  COPD DJD post THA HTN HLD  Significant Hospital Events: Including procedures, antibiotic start and stop dates in addition to other pertinent events   9/7 Admitted and thoracentesis 9/8 EBUS 9/10 Respiratory arrest and intubated on floor 9/11 Central line placed 9/12 Bedside US guided right pleural thoracentesis 9/14 Chest tube placed  Interim History / Subjective:  Chest tube placed yesterday. Did require increasing sedatives and after requiring increasing amounts of vasopressor support.  Had around 500 cc urinary output.  He remained afebrile.  Objective   Blood pressure (!) 112/52, pulse 90, temperature 98.8 F (37.1 C), temperature source Axillary, resp. rate (!) 21, height 6\' 2"  (1.88 m), weight 62 kg, SpO2 98 %.    Vent Mode: PRVC FiO2 (%):  [40 %] 40 % Set Rate:  [20 bmp] 20 bmp Vt Set:  [640 mL] 640 mL PEEP:  [5  cmH20] 5 cmH20 Plateau Pressure:  [24 cmH20-29 cmH20] 24 cmH20   Intake/Output Summary (Last 24 hours) at 11/08/2021 0851 Last data filed at 11/08/2021 0700 Gross per 24 hour  Intake 2828.06 ml  Output 1470 ml  Net 1358.06 ml   Filed Weights   11/04/21 0645 11/07/21 0345 11/08/21 0500  Weight: 58.5 kg 61.2 kg 62 kg   Physical Exam: General: Critically ill-appearing, thin male sedated HENT: [place], AT, ETT in place Eyes: No scleral icterus.  Pupils reactive to light. Respiratory: Mechanical ventilation appreciated.  Bilateral rhonchi.  No wheezing, crackles, rales Cardiovascular: regular rate and rhythm, No M,G,R GI: distended, soft, distant bowel sounds. Extremities: Lower extremities cool to touch, dry. Neuro: Sedated.  Not following commands  Resolved Hospital Problem list    Assessment & Plan:   Acute encephalopathy secondary respiratory arrest Acute hypoxemic respiratory failure secondary to suspected aspiration, post-obstructive pna and pleural effusion Doing well with SBT will continue with this.  Continue on full vent support, right chest tube was placed yesterday and pleural cultures were sent with worsening leukocytosis.   --Intubated. --Full vent support --Antibiotics as per below --Follow blood,, pleural, and resp cx --PAD protocol for RASS goal -1 --VAP --triglycerides stable continue to check daily with propofol use  Septic shock secondary unknown cause, post-obstructive pna vs UTI Leukocytosis increased from 48>66 with worsening neutrophilia and eosinophilia. He has been given cefepime day 6 and vancomycin day 3. Blood cultures thus far have had no growth. Pleural fluid cultures without growth. Respiratory cultures with WBC and moderate yeast with pseudohyphae, suspect colonized. No  urine culture collected this admission, in the past urine studies have been pan-sensitive. He has remained afebrile. While possibly secondary to his chest tube placement yesterday with  continued worsening of his leukocytosis broaden abx to zosyn and discontinue vancomycin with negative MRSA nasal swab. Also add on TSH, cortisol, and lipase.  --Follow up blood cultures --Transition to zosyn today, day 1 --Blood smear pending --Discuss broadening abx further today --LA pending --f/u TSH, cortisol, and lipase. Consider stress dose steroids --Wean pressors as able, MAP goal over 65  Non-oliguric AKI 2/2 septic shock Urinary tract infection Hemoglobinuria -550 urine output yesterday. Cr at baseline. Broaden lasix as per above.  -trend Cr -Abx as per above -foley catheter in place -Strict I/O  Anion gap metabolic acidosis 2/2 septic shock Resolved  Hyponatremia - resolved Hypophosphatemia Mag and phos repleted overnight. Started on tube feeds yesterday. 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 Around -700 cc output this morning --Will need outpatient follow up with heme/onc  Ileus - resolved Continue trickle feeeds -SSI and semglee 5 U BID  Best Practice (right click and "Reselect all SmartList Selections" daily)   Diet/type: Trickle feeds DVT prophylaxis: Subcu heparin GI prophylaxis: PPI Lines: Central line, a line. Right chest tube Foley:  placed 11/03/21 Code Status:  full code Last date of multidisciplinary goals of care discussion  -discussed with sister on 9/14   Labs   CBC: Recent Labs  Lab 11/03/21 0249 11/03/21 1022 11/04/21 0539 11/04/21 0810 11/05/21 0529 11/05/21 0732 11/06/21 0016 11/06/21 0402 11/07/21 0322 11/07/21 0342 11/08/21 0430  WBC 39.3*   < > 37.0*  --  39.1*  --  44.3*  --   --  48.3* 66.3*  NEUTROABS 30.7*  --   --   --  31.1*  --  36.0*  --   --  40.6* 61.0*  HGB 13.9   < > 12.6*   < > 10.4*   < > 10.3* 9.9* 9.9* 10.5* 9.4*  HCT 39.7   < >  36.5*   < > 29.6*   < > 29.3* 29.0* 29.0* 30.6* 27.6*  MCV 91.9   < > 93.1  --  91.1  --  92.7  --   --  92.7 93.6  PLT 413*   < > 278  --  228  --  221  --   --  186 133*   < > = values in this interval not displayed.    Basic Metabolic Panel: Recent Labs  Lab 11/04/21 0539 11/04/21 0810 11/05/21 0529 11/05/21 0732 11/06/21 0016 11/06/21 0402 11/06/21 1656 11/07/21 0322 11/07/21 0342 11/07/21 1127 11/08/21 0430  NA 133*   < > 135   < > 139 138  --  144 143  --  141  K 4.9   < > 4.1   < > 3.9 4.0  --  3.3* 3.4*  --  4.2  CL 101  --  104  --  110  --   --   --  115*  --  113*  CO2 18*  --  18*  --  18*  --   --   --  19*  --  20*  GLUCOSE 155*  --  136*  --  94  --   --   --  153*  --  185*  BUN 36*  --  46*  --  40*  --   --   --  37*  --  32*  CREATININE 1.77*  --  1.62*  --  1.28*  --   --   --  1.01  --  0.92  CALCIUM 7.6*  --  7.2*  --  7.3*  --   --   --  7.5*  --  7.3*  MG 1.7  --  2.5*  --  2.4  --  2.5*  --  2.3 2.3 1.8  PHOS  --    < > 4.6  --  3.5  --  2.3*  --  2.0* 3.2 1.6*   < > = values in this interval not displayed.   GFR: Estimated Creatinine Clearance: 67.4 mL/min (by C-G formula based on SCr of 0.92 mg/dL). Recent Labs  Lab 11/05/21 0529 11/05/21 0531 11/06/21 0016 11/06/21 0017 11/06/21 6160 11/06/21 0924 11/06/21 1656 11/07/21 0342 11/07/21 0921 11/08/21 0430  PROCALCITON  --   --   --   --   --   --   --   --  12.04 8.45  WBC 39.1*  --  44.3*  --   --   --   --  48.3*  --  66.3*  LATICACIDVEN  --    < >  --    < > 2.2* 2.0* 2.2* 2.3*  --   --    < > = values in this interval not displayed.    Liver Function Tests: Recent Labs  Lab 11/03/21 0249 11/03/21 1236 11/04/21 1614 11/08/21 0430  AST 20 22  --  25  ALT 8 7  --  10  ALKPHOS 81 67  --  63  BILITOT 0.4 0.9  --  0.6  PROT 6.0* 4.9* 4.8* 4.4*  ALBUMIN 2.3* 1.8*  --  <1.5*   No results for input(s): "LIPASE", "AMYLASE" in the last 168 hours. No results for input(s): "AMMONIA"  in the last 168 hours.  ABG    Component Value Date/Time   PHART 7.408 11/07/2021 0322   PCO2ART 28.5 (L) 11/07/2021 0322   PO2ART 55 (L) 11/07/2021 0322   HCO3 18.0 (L) 11/07/2021 0322   TCO2 19 (L) 11/07/2021 0322   ACIDBASEDEF 6.0 (H) 11/07/2021 0322   O2SAT 89 11/07/2021 0322     Coagulation Profile: No results for input(s): "INR", "PROTIME" in the last 168 hours.  Cardiac Enzymes: No results for input(s): "CKTOTAL", "CKMB", "CKMBINDEX", "TROPONINI" in the last 168 hours.  HbA1C: Hgb A1c MFr Bld  Date/Time Value Ref Range Status  11/07/2021 04:27 PM 5.7 (H) 4.8 - 5.6 % Final    Comment:    (NOTE) Pre diabetes:          5.7%-6.4%  Diabetes:              >6.4%  Glycemic control for   <7.0% adults with diabetes     CBG: Recent Labs  Lab 11/07/21 1707 11/07/21 2016 11/07/21 2355 11/08/21 0439 11/08/21 0741  GLUCAP 173* 120* 143* 176* 146*    Review of Systems:   Unable to obtain due to critical status  Past Medical History:  He,  has a past medical history of Allergy, Anxiety, Arthritis, Asthma, Atherosclerosis, Bipolar 1 disorder ([place]), Cataract, COPD (chronic obstructive pulmonary disease) ([place]), Depression, Elevated PSA, ETOH abuse, GERD (gastroesophageal reflux disease), Hyperlipidemia, Hypertension, and Right hip pain (04/13/2019).   Surgical History:   Past  Surgical History:  Procedure Laterality Date   BACK SURGERY     lunbar   BRONCHIAL NEEDLE ASPIRATION BIOPSY  11/09/2021   Procedure: BRONCHIAL NEEDLE ASPIRATION BIOPSIES;  Surgeon: Candee Furbish, MD;  Location[facility] ENDOSCOPY;  Service: Pulmonary;;   COLONOSCOPY  12/21/09   UYQ:IHKV papilla otherwise normal/ pancolonic diverticula/mutiple colonic poylps   COLONOSCOPY WITH ESOPHAGOGASTRODUODENOSCOPY (EGD) N/A 10/20/2012   Procedure: COLONOSCOPY WITH ESOPHAGOGASTRODUODENOSCOPY (EGD);  Surgeon: Daneil Dolin, MD;  Location: [facility] ENDO SU[facility];  Service: Endoscopy;  Laterality: N/A;  10;15   CYSTOSCOPY  N/A 01/31/2013   Procedure: CYSTOSCOPY FLEXIBLE;  Surgeon: Marissa Nestle, MD;  Location: [facility] ORS;  Service: Urology;  Laterality: N/A;  I would like to do this around 1 pm on monday.    HARDWARE REMOVAL Left 06/07/2013   Procedure: HARDWARE REMOVAL;  Surgeon: Mcarthur Rossetti, MD;  Location: [place];  Service: Orthopedics;  Laterality: Left;   INTRAMEDULLARY (IM) NAIL INTERTROCHANTERIC Right 04/14/2019   Procedure: INTRAMEDULLARY (IM) NAIL INTERTROCHANTRIC;  Surgeon: Leandrew Koyanagi, MD;  Location: [place];  Service: Orthopedics;  Laterality: Right;   IR THORACENTESIS ASP PLEURAL SPACE W/IMG GUIDE  11/13/2021   ORIF HIP FRACTURE Left 11/24/2012   Procedure: OPEN REDUCTION INTERNAL FIXATION HIP;  Surgeon: Sanjuana Kava, MD;  Location: [facility];  Service: Orthopedics;  Laterality: Left;   SPINE SURGERY     TOTAL HIP ARTHROPLASTY Left 06/07/2013   Procedure: REMOVE HARDWARE LEFT HIP AND LEFT TOTAL HIP ARTHROPLASTY ANTERIOR APPROACH;  Surgeon: Mcarthur Rossetti, MD;  Location: [place];  Service: Orthopedics;  Laterality: Left;   TRANSURETHRAL RESECTION OF PROSTATE N/A 03/22/2013   Procedure: TRANSURETHRAL RESECTION OF THE PROSTATE (TURP);  Surgeon: Marissa Nestle, MD;  Location: [facility];  Service: Urology;  Laterality: N/A;   VIDEO BRONCHOSCOPY WITH ENDOBRONCHIAL ULTRASOUND N/A 11/07/2021   Procedure: VIDEO BRONCHOSCOPY WITH ENDOBRONCHIAL ULTRASOUND;  Surgeon: Candee Furbish, MD;  Location[facility] ENDOSCOPY;  Service: Pulmonary;  Laterality: N/A;     Social History:   reports that he has been smoking cigarettes. He has a 15.00 pack-year smoking history. He has never used smokeless tobacco. He reports that he does not currently use alcohol. He reports that he does not currently use drugs after having used the following drugs: Marijuana and Cocaine.   Family History:  His family history includes Mental illness in his sister. There is no history of Colon cancer.   Allergies No Known Allergies   Home  Medications  Prior to Admission medications   Medication Sig Start Date End Date Taking? Authorizing Provider  acetaminophen (TYLENOL) 325 MG tablet Take 650 mg by mouth every 6 (six) hours as needed.   Yes [provider]  Carboxymethylcellul-Glycerin (CLEAR EYES FOR DRY EYES) 1-0.25 % SOLN Apply 1-2 drops to eye 2 (two) times daily as needed.   Yes [provider]  ibuprofen (ADVIL) 200 MG tablet Take 200 mg by mouth every 6 (six) hours as needed for moderate pain.   Yes [provider]  PROAIR HFA 108 (90 Base) MCG/ACT inhaler INHALE 2 PUFFS INTO THE LUNGS EVERY 4 TO 6 HOURS AS NEEDED. Patient taking differently: Inhale 2 puffs into the lungs every 4 (four) hours as needed for wheezing or shortness of breath. 08/28/16  Yes Susy Frizzle, MD  SYMBICORT 160-4.5 MCG/ACT inhaler Inhale 1 puff into the lungs 2 (two) times daily. 02/08/19  Yes [provider]  diclofenac Sodium (VOLTAREN) 1 % GEL Apply 2 g topically  4 (four) times daily. 11/04/20   Tacy Learn, PA-C  sertraline (ZOLOFT) 25 MG tablet Take 25 mg by mouth daily. 07/10/21   [provider]  tiZANidine (ZANAFLEX) 2 MG tablet Take 2 mg by mouth 2 (two) times daily as needed. 05/31/19   [provider]    [place]  Internal Medicine Resident PGY-3 [place]  Pager: [phone]

## 2021-11-09 ENCOUNTER — Inpatient Hospital Stay (HOSPITAL_COMMUNITY): Payer: Medicare Other

## 2021-11-09 DIAGNOSIS — C3491 Malignant neoplasm of unspecified part of right bronchus or lung: Secondary | ICD-10-CM | POA: Diagnosis not present

## 2021-11-09 LAB — POCT I-STAT 7, (LYTES, BLD GAS, ICA,H+H)
Acid-base deficit: 3 mmol/L — ABNORMAL HIGH (ref 0.0–2.0)
Bicarbonate: 20.1 mmol/L (ref 20.0–28.0)
Calcium, Ion: 1.07 mmol/L — ABNORMAL LOW (ref 1.15–1.40)
HCT: 28 % — ABNORMAL LOW (ref 39.0–52.0)
Hemoglobin: 9.5 g/dL — ABNORMAL LOW (ref 13.0–17.0)
O2 Saturation: 92 %
Patient temperature: 97.5
Potassium: 3.9 mmol/L (ref 3.5–5.1)
Sodium: 142 mmol/L (ref 135–145)
TCO2: 21 mmol/L — ABNORMAL LOW (ref 22–32)
pCO2 arterial: 28.5 mmHg — ABNORMAL LOW (ref 32–48)
pH, Arterial: 7.454 — ABNORMAL HIGH (ref 7.35–7.45)
pO2, Arterial: 56 mmHg — ABNORMAL LOW (ref 83–108)

## 2021-11-09 LAB — CBC WITH DIFFERENTIAL/PLATELET
Abs Immature Granulocytes: 4.9 10*3/uL — ABNORMAL HIGH (ref 0.00–0.07)
Basophils Absolute: 0 10*3/uL (ref 0.0–0.1)
Basophils Relative: 0 %
Eosinophils Absolute: 0 10*3/uL (ref 0.0–0.5)
Eosinophils Relative: 0 %
HCT: 25.4 % — ABNORMAL LOW (ref 39.0–52.0)
Hemoglobin: 8.8 g/dL — ABNORMAL LOW (ref 13.0–17.0)
Lymphocytes Relative: 0 %
Lymphs Abs: 0 10*3/uL — ABNORMAL LOW (ref 0.7–4.0)
MCH: 31.9 pg (ref 26.0–34.0)
MCHC: 34.6 g/dL (ref 30.0–36.0)
MCV: 92 fL (ref 80.0–100.0)
Metamyelocytes Relative: 1 %
Monocytes Absolute: 1.8 10*3/uL — ABNORMAL HIGH (ref 0.1–1.0)
Monocytes Relative: 3 %
Myelocytes: 6 %
Neutro Abs: 54.6 10*3/uL — ABNORMAL HIGH (ref 1.7–7.7)
Neutrophils Relative %: 89 %
Platelets: 113 10*3/uL — ABNORMAL LOW (ref 150–400)
Promyelocytes Relative: 1 %
RBC: 2.76 MIL/uL — ABNORMAL LOW (ref 4.22–5.81)
RDW: 14.5 % (ref 11.5–15.5)
WBC: 61.4 10*3/uL (ref 4.0–10.5)
nRBC: 0 % (ref 0.0–0.2)
nRBC: 0 /100 WBC

## 2021-11-09 LAB — BASIC METABOLIC PANEL
Anion gap: 9 (ref 5–15)
BUN: 28 mg/dL — ABNORMAL HIGH (ref 8–23)
CO2: 20 mmol/L — ABNORMAL LOW (ref 22–32)
Calcium: 7.5 mg/dL — ABNORMAL LOW (ref 8.9–10.3)
Chloride: 112 mmol/L — ABNORMAL HIGH (ref 98–111)
Creatinine, Ser: 0.91 mg/dL (ref 0.61–1.24)
GFR, Estimated: >60 mL/min (ref >60)
Glucose, Bld: 150 mg/dL — ABNORMAL HIGH (ref 70–99)
Potassium: 3.8 mmol/L (ref 3.5–5.1)
Sodium: 141 mmol/L (ref 135–145)

## 2021-11-09 LAB — GLUCOSE, CAPILLARY
Glucose-Capillary: 105 mg/dL — ABNORMAL HIGH (ref 70–99)
Glucose-Capillary: 88 mg/dL (ref 70–99)
Glucose-Capillary: 96 mg/dL (ref 70–99)
Glucose-Capillary: 78 mg/dL (ref 70–99)
Glucose-Capillary: 122 mg/dL — ABNORMAL HIGH (ref 70–99)
Glucose-Capillary: 113 mg/dL — ABNORMAL HIGH (ref 70–99)

## 2021-11-09 LAB — PHOSPHORUS
Phosphorus: 4.1 mg/dL (ref 2.5–4.6)
Phosphorus: 3.2 mg/dL (ref 2.5–4.6)

## 2021-11-09 LAB — MAGNESIUM
Magnesium: 2.1 mg/dL (ref 1.7–2.4)
Magnesium: 2 mg/dL (ref 1.7–2.4)

## 2021-11-09 MED ORDER — POTASSIUM CHLORIDE 20 MEQ PO PACK
20.0000 meq | PACK | Freq: Once | ORAL | Status: AC
  Administered 2021-11-09: 20 meq
  Filled 2021-11-09: qty 1

## 2021-11-09 NOTE — Progress Notes (Addendum)
NAME:  Duane Richardson, MRN:  [id number], DOB:  March 26, 1955, LOS: 9 ADMISSION DATE:  11/06/2021, CONSULTATION DATE:  11/20/2021 REFERRING MD:  Terrilee Croak, MD CHIEF COMPLAINT:  Hemoptysis, Respiratory arrest   History of Present Illness:  66 year old with tobacco abuse with COPD who presents with cough, hemoptysis, weight loss, fatigue. Imaging markedly abnormal and PCCM consulted. CTA neg for PE with large right pleural effusion and RUL mass measuring 5.3 x 3.8 cm with mass effect of right mainstem, BI, RML and RLLL with pleural based nodules, centrilobular emphysema. Had thoracentesis on 9/8 with 2L exudative bloody fluid 275.  CT A/P with probable hepatic, left adrenal and left perinrenal mets. Underwent EBUS 9/8 with sampling of 4R. He remained in the hospital for work-up for abdominal pain and constipation  9/10 patient had worsening respiratory status requiring 15L O2 with concern for aspiration. Rapid response called and patient had agonal breathing. Pulses reported intact. He was intubated on the floor. PCCM consulted for transfer.  Pertinent  Medical History  COPD DJD post THA HTN HLD  Significant Hospital Events: Including procedures, antibiotic start and stop dates in addition to other pertinent events   9/7 Admitted and thoracentesis 9/8 EBUS 9/10 Respiratory arrest and intubated on floor 9/11 Central line placed 9/12 Bedside US guided right pleural thoracentesis 9/14 Chest tube placed  Interim History / Subjective:  No acute events overnight.  He did require decreasing amounts of Levophed after initiating stress dose steroids  Objective   Blood pressure 138/66, pulse 71, temperature 97.6 F (36.4 C), temperature source Oral, resp. rate 20, height 6\' 2"  (1.88 m), weight 63.2 kg, SpO2 100 %.    Vent Mode: PRVC FiO2 (%):  [40 %] 40 % Set Rate:  [20 bmp] 20 bmp Vt Set:  [640 mL] 640 mL PEEP:  [5 cmH20] 5 cmH20 Plateau Pressure:  [24 cmH20-31 cmH20] 26 cmH20   Intake/Output  Summary (Last 24 hours) at 11/09/2021 0647 Last data filed at 11/09/2021 0400 Gross per 24 hour  Intake 3753.13 ml  Output 915 ml  Net 2838.13 ml    Filed Weights   11/07/21 0345 11/08/21 0500 11/09/21 0500  Weight: 61.2 kg 62 kg 63.2 kg   Physical Exam: General: Critically ill-appearing, thin male sedated HENT: [place], AT, ETT in place Eyes: No scleral icterus.  Mydriasis, reactive to light. Respiratory: Mechanical ventilation appreciated.  Bilateral rhonchi.  No wheezing, crackles, rales Cardiovascular: regular rate and rhythm, No M,G,R GI: distended, soft, distant bowel sounds. Extremities: Lower extremities cool to touch, dry. Neuro: Sedated.  Not following commands  Resolved Hospital Problem list    Assessment & Plan:   Acute encephalopathy secondary respiratory arrest Acute hypoxemic respiratory failure secondary to suspected aspiration, post-obstructive pna and pleural effusion Continue spontaneous breathing trials and will wean sedatives as able.  Hopeful with chest tube placement and continuous training of his pleural effusion we will be able to extubate and have further goals of care discussion in light of his metastatic adenocarcinoma of the lung.  Continue to treat pneumonia as per below --Intubated. --Full vent support --ABG this morning --Antibiotics as per below --Follow blood,, pleural, and resp cx --PAD protocol for RASS goal 0 --VAP --triglycerides stable, continue to check every 72 hours  Septic shock secondary unknown cause, post-obstructive pna vs UTI Adrenal Insufficiency secondary to metastatic adenocarcinoma.  Leukocytosis decreased from 66>61.  Antibiotics broadened yesterday to Zosyn.  Continues to have no growth of blood, respiratory, pleural fluid cultures.  Also  able to decrease his vasopressor requirements with addition of stress dose steroids.  He does have metastatic disease to his adrenal glands which are likely causing adrenal  insufficiency. --Follow up blood cultures -- Continue Zosyn day 2/7 --Wean pressors as able, MAP goal over 65 -- Continue stress dose steroids, hydrocortisone 100 mg twice daily  Non-oliguric AKI 2/2 septic shock-resolved Urinary tract infection Hemoglobinuria -(-) 200 cc urine output yesterday. Cr at baseline -trend Cr -Abx as per above -foley catheter in place -Strict I/O  Metabolic acidosis secondary to shock Anion gap is normal.  Decreased bicarb likely in the setting of adrenal insufficiency.  Hyponatremia - resolved Hypophosphatemia Mag and Phos normalized.  Likely in setting of nutritional insufficiency.  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 Around -700 cc output this morning --Will need outpatient follow up with heme/onc  Ileus  Emesis Patient with emesis yesterday after starting trickle feeds.  Feeds were discontinued and OG was placed to wall suction.  He has good output.  We will continue to hold SSI while holding tube feeds.  KUB yesterday without evidence of bowel obstruction.  If any worsening abdominal distention or leukocytosis will consider CT abdomen pelvis.  He has high risk for bowel obstruction with his metastatic disease to the liver adrenal and perirenal areas. -SSI and semglee 5 U BID, adjust as needed glucose goal of 140-1 80 -Continue to hold tube feeds, restart when able  Best Practice (right click and "Reselect all SmartList Selections" daily)   Diet/type: hold Trickle feeds DVT prophylaxis: Subcu heparin GI prophylaxis: PPI Lines: Central line, a line. Right chest tube Foley:  placed 11/03/21 Code Status:  full code Last date of multidisciplinary goals of care discussion - plan to call and discuss with sister today.  Labs   CBC: Recent Labs  Lab  11/05/21 0529 11/05/21 0732 11/06/21 0016 11/06/21 0402 11/07/21 0322 11/07/21 0342 11/08/21 0430 11/09/21 0420  WBC 39.1*  --  44.3*  --   --  48.3* 66.3* 61.4*  NEUTROABS 31.1*  --  36.0*  --   --  40.6* 61.0* 54.6*  HGB 10.4*   < > 10.3* 9.9* 9.9* 10.5* 9.4* 8.8*  HCT 29.6*   < > 29.3* 29.0* 29.0* 30.6* 27.6* 25.4*  MCV 91.1  --  92.7  --   --  92.7 93.6 92.0  PLT 228  --  221  --   --  186 133* 113*   < > = values in this interval not displayed.     Basic Metabolic Panel: Recent Labs  Lab 11/05/21 0529 11/05/21 0732 11/06/21 0016 11/06/21 0402 11/06/21 1656 11/07/21 0322 11/07/21 0342 11/07/21 1127 11/08/21 0430 11/08/21 1736 11/09/21 0420  NA 135   < > 139 138  --  144 143  --  141  --  141  K 4.1   < > 3.9 4.0  --  3.3* 3.4*  --  4.2  --  3.8  CL 104  --  110  --   --   --  115*  --  113*  --  112*  CO2 18*  --  18*  --   --   --  19*  --  20*  --  20*  GLUCOSE 136*  --  94  --   --   --  153*  --  185*  --  150*  BUN 46*  --  40*  --   --   --  37*  --  32*  --  28*  CREATININE 1.62*  --  1.28*  --   --   --  1.01  --  0.92  --  0.91  CALCIUM 7.2*  --  7.3*  --   --   --  7.5*  --  7.3*  --  7.5*  MG 2.5*  --  2.4  --    < >  --  2.3 2.3 1.8 2.2 2.1  PHOS 4.6  --  3.5  --    < >  --  2.0* 3.2 1.6* 4.7* 4.1   < > = values in this interval not displayed.    GFR: Estimated Creatinine Clearance: 69.5 mL/min (by C-G formula based on SCr of 0.91 mg/dL). Recent Labs  Lab 11/06/21 0016 11/06/21 0017 11/06/21 1656 11/07/21 0342 11/07/21 0921 11/08/21 0430 11/08/21 0743 11/08/21 1138 11/09/21 0420  PROCALCITON  --   --   --   --  12.04 8.45  --   --   --   WBC 44.3*  --   --  48.3*  --  66.3*  --   --  61.4*  LATICACIDVEN  --    < > 2.2* 2.3*  --   --  2.2* 2.0*  --    < > = values in this interval not displayed.     Liver Function Tests: Recent Labs  Lab 11/03/21 0249 11/03/21 1236 11/04/21 1614 11/08/21 0430  AST 20 22  --  25  ALT 8 7  --  10   ALKPHOS 81 67  --  63  BILITOT 0.4 0.9  --  0.6  PROT 6.0* 4.9* 4.8* 4.4*  ALBUMIN 2.3* 1.8*  --  <1.5*    Recent Labs  Lab 11/08/21 0430  LIPASE 16   No results for input(s): "AMMONIA" in the last 168 hours.  ABG    Component Value Date/Time   PHART 7.408 11/07/2021 0322   PCO2ART 28.5 (L) 11/07/2021 0322   PO2ART 55 (L) 11/07/2021 0322   HCO3 18.0 (L) 11/07/2021 0322   TCO2 19 (L) 11/07/2021 0322   ACIDBASEDEF 6.0 (H) 11/07/2021 0322   O2SAT 89 11/07/2021 0322     Coagulation Profile: No results for input(s): "INR", "PROTIME" in the last 168 hours.  Cardiac Enzymes: No results for input(s): "CKTOTAL", "CKMB", "CKMBINDEX", "TROPONINI" in the last 168 hours.  HbA1C: Hgb A1c MFr Bld  Date/Time Value Ref Range Status  11/07/2021 04:27 PM 5.7 (H) 4.8 - 5.6 % Final    Comment:    (NOTE) Pre diabetes:          5.7%-6.4%  Diabetes:              >6.4%  Glycemic control for   <7.0% adults with diabetes     CBG: Recent Labs  Lab 11/08/21 1138 11/08/21 1613 11/08/21 1931 11/08/21 2309 11/09/21 0321  GLUCAP 187* 96 130* 81 105*     Review of Systems:   Unable to obtain due to critical status  Past Medical History:  He,  has a past medical history of Allergy, Anxiety, Arthritis, Asthma, Atherosclerosis, Bipolar 1 disorder ([place]), Cataract, COPD (chronic obstructive pulmonary disease) ([place]), Depression, Elevated PSA, ETOH abuse, GERD (gastroesophageal reflux disease), Hyperlipidemia, Hypertension, and Right hip pain (04/13/2019).   Surgical History:   Past Surgical  History:  Procedure Laterality Date   BACK SURGERY     lunbar   BRONCHIAL NEEDLE ASPIRATION BIOPSY  11/22/2021   Procedure: BRONCHIAL NEEDLE ASPIRATION BIOPSIES;  Surgeon: Candee Furbish, MD;  Location[facility] ENDOSCOPY;  Service: Pulmonary;;   COLONOSCOPY  12/21/09   WGN:FAOZ papilla otherwise normal/ pancolonic diverticula/mutiple colonic poylps   COLONOSCOPY WITH ESOPHAGOGASTRODUODENOSCOPY (EGD)  N/A 10/20/2012   Procedure: COLONOSCOPY WITH ESOPHAGOGASTRODUODENOSCOPY (EGD);  Surgeon: Daneil Dolin, MD;  Location: [facility];  Service: Endoscopy;  Laterality: N/A;  10;15   CYSTOSCOPY N/A 01/31/2013   Procedure: CYSTOSCOPY FLEXIBLE;  Surgeon: Marissa Nestle, MD;  Location: [facility] ORS;  Service: Urology;  Laterality: N/A;  I would like to do this around 1 pm on monday.    HARDWARE REMOVAL Left 06/07/2013   Procedure: HARDWARE REMOVAL;  Surgeon: Mcarthur Rossetti, MD;  Location: [place];  Service: Orthopedics;  Laterality: Left;   INTRAMEDULLARY (IM) NAIL INTERTROCHANTERIC Right 04/14/2019   Procedure: INTRAMEDULLARY (IM) NAIL INTERTROCHANTRIC;  Surgeon: Leandrew Koyanagi, MD;  Location: [place];  Service: Orthopedics;  Laterality: Right;   IR THORACENTESIS ASP PLEURAL SPACE W/IMG GUIDE  11/21/2021   ORIF HIP FRACTURE Left 11/24/2012   Procedure: OPEN REDUCTION INTERNAL FIXATION HIP;  Surgeon: Sanjuana Kava, MD;  Location: [facility];  Service: Orthopedics;  Laterality: Left;   SPINE SURGERY     TOTAL HIP ARTHROPLASTY Left 06/07/2013   Procedure: REMOVE HARDWARE LEFT HIP AND LEFT TOTAL HIP ARTHROPLASTY ANTERIOR APPROACH;  Surgeon: Mcarthur Rossetti, MD;  Location: [place];  Service: Orthopedics;  Laterality: Left;   TRANSURETHRAL RESECTION OF PROSTATE N/A 03/22/2013   Procedure: TRANSURETHRAL RESECTION OF THE PROSTATE (TURP);  Surgeon: Marissa Nestle, MD;  Location: [facility];  Service: Urology;  Laterality: N/A;   VIDEO BRONCHOSCOPY WITH ENDOBRONCHIAL ULTRASOUND N/A 11/14/2021   Procedure: VIDEO BRONCHOSCOPY WITH ENDOBRONCHIAL ULTRASOUND;  Surgeon: Candee Furbish, MD;  Location: [facility] ENDOSCOPY;  Service: Pulmonary;  Laterality: N/A;     Social History:   reports that he has been smoking cigarettes. He has a 15.00 pack-year smoking history. He has never used smokeless tobacco. He reports that he does not currently use alcohol. He reports that he does not currently use drugs after having used the  following drugs: Marijuana and Cocaine.   Family History:  His family history includes Mental illness in his sister. There is no history of Colon cancer.   Allergies No Known Allergies   Home Medications  Prior to Admission medications   Medication Sig Start Date End Date Taking? Authorizing Provider  acetaminophen (TYLENOL) 325 MG tablet Take 650 mg by mouth every 6 (six) hours as needed.   Yes [provider]  Carboxymethylcellul-Glycerin (CLEAR EYES FOR DRY EYES) 1-0.25 % SOLN Apply 1-2 drops to eye 2 (two) times daily as needed.   Yes [provider]  ibuprofen (ADVIL) 200 MG tablet Take 200 mg by mouth every 6 (six) hours as needed for moderate pain.   Yes [provider]  PROAIR HFA 108 (90 Base) MCG/ACT inhaler INHALE 2 PUFFS INTO THE LUNGS EVERY 4 TO 6 HOURS AS NEEDED. Patient taking differently: Inhale 2 puffs into the lungs every 4 (four) hours as needed for wheezing or shortness of breath. 08/28/16  Yes Susy Frizzle, MD  SYMBICORT 160-4.5 MCG/ACT inhaler Inhale 1 puff into the lungs 2 (two) times daily. 02/08/19  Yes [provider]  diclofenac Sodium (VOLTAREN) 1 % GEL Apply 2 g topically 4 (  four) times daily. 11/04/20   Tacy Learn, PA-C  sertraline (ZOLOFT) 25 MG tablet Take 25 mg by mouth daily. 07/10/21   [provider]  tiZANidine (ZANAFLEX) 2 MG tablet Take 2 mg by mouth 2 (two) times daily as needed. 05/31/19   [provider]    [place]  Internal Medicine Resident PGY-3 [place]  Pager: [phone]

## 2021-11-10 DIAGNOSIS — C3491 Malignant neoplasm of unspecified part of right bronchus or lung: Secondary | ICD-10-CM | POA: Diagnosis not present

## 2021-11-10 LAB — CBC WITH DIFFERENTIAL/PLATELET
Abs Immature Granulocytes: 1.8 10*3/uL — ABNORMAL HIGH (ref 0.00–0.07)
Basophils Absolute: 0 10*3/uL (ref 0.0–0.1)
Basophils Relative: 0 %
Eosinophils Absolute: 0.6 10*3/uL — ABNORMAL HIGH (ref 0.0–0.5)
Eosinophils Relative: 1 %
HCT: 25 % — ABNORMAL LOW (ref 39.0–52.0)
Hemoglobin: 8.6 g/dL — ABNORMAL LOW (ref 13.0–17.0)
Lymphocytes Relative: 3 %
Lymphs Abs: 1.8 10*3/uL (ref 0.7–4.0)
MCH: 31.6 pg (ref 26.0–34.0)
MCHC: 34.4 g/dL (ref 30.0–36.0)
MCV: 91.9 fL (ref 80.0–100.0)
Metamyelocytes Relative: 1 %
Monocytes Absolute: 0 10*3/uL — ABNORMAL LOW (ref 0.1–1.0)
Monocytes Relative: 0 %
Myelocytes: 2 %
Neutro Abs: 56.6 10*3/uL — ABNORMAL HIGH (ref 1.7–7.7)
Neutrophils Relative %: 93 %
Platelets: 127 10*3/uL — ABNORMAL LOW (ref 150–400)
RBC: 2.72 MIL/uL — ABNORMAL LOW (ref 4.22–5.81)
RDW: 14.5 % (ref 11.5–15.5)
WBC: 60.9 10*3/uL (ref 4.0–10.5)
nRBC: 0 % (ref 0.0–0.2)
nRBC: 0 /100 WBC

## 2021-11-10 LAB — BASIC METABOLIC PANEL
Anion gap: 9 (ref 5–15)
Anion gap: 8 (ref 5–15)
BUN: 31 mg/dL — ABNORMAL HIGH (ref 8–23)
BUN: 33 mg/dL — ABNORMAL HIGH (ref 8–23)
CO2: 20 mmol/L — ABNORMAL LOW (ref 22–32)
CO2: 21 mmol/L — ABNORMAL LOW (ref 22–32)
Calcium: 7.2 mg/dL — ABNORMAL LOW (ref 8.9–10.3)
Calcium: 7.5 mg/dL — ABNORMAL LOW (ref 8.9–10.3)
Chloride: 113 mmol/L — ABNORMAL HIGH (ref 98–111)
Chloride: 114 mmol/L — ABNORMAL HIGH (ref 98–111)
Creatinine, Ser: 0.96 mg/dL (ref 0.61–1.24)
Creatinine, Ser: 0.96 mg/dL (ref 0.61–1.24)
GFR, Estimated: >60 mL/min (ref >60)
GFR, Estimated: >60 mL/min (ref >60)
Glucose, Bld: 115 mg/dL — ABNORMAL HIGH (ref 70–99)
Glucose, Bld: 107 mg/dL — ABNORMAL HIGH (ref 70–99)
Potassium: 3 mmol/L — ABNORMAL LOW (ref 3.5–5.1)
Potassium: 4 mmol/L (ref 3.5–5.1)
Sodium: 142 mmol/L (ref 135–145)
Sodium: 143 mmol/L (ref 135–145)

## 2021-11-10 LAB — MAGNESIUM
Magnesium: 2.1 mg/dL (ref 1.7–2.4)
Magnesium: 2.1 mg/dL (ref 1.7–2.4)

## 2021-11-10 LAB — GLUCOSE, CAPILLARY
Glucose-Capillary: 77 mg/dL (ref 70–99)
Glucose-Capillary: <10 mg/dL — CL (ref 70–99)
Glucose-Capillary: 157 mg/dL — ABNORMAL HIGH (ref 70–99)
Glucose-Capillary: 65 mg/dL — ABNORMAL LOW (ref 70–99)
Glucose-Capillary: <10 mg/dL — CL (ref 70–99)
Glucose-Capillary: <10 mg/dL — CL (ref 70–99)
Glucose-Capillary: 118 mg/dL — ABNORMAL HIGH (ref 70–99)
Glucose-Capillary: 41 mg/dL — CL (ref 70–99)
Glucose-Capillary: 106 mg/dL — ABNORMAL HIGH (ref 70–99)
Glucose-Capillary: 100 mg/dL — ABNORMAL HIGH (ref 70–99)
Glucose-Capillary: 95 mg/dL (ref 70–99)

## 2021-11-10 LAB — BODY FLUID CULTURE W GRAM STAIN
Culture: NO GROWTH
Gram Stain: NONE SEEN

## 2021-11-10 LAB — PHOSPHORUS
Phosphorus: 3.4 mg/dL (ref 2.5–4.6)
Phosphorus: 2.8 mg/dL (ref 2.5–4.6)

## 2021-11-10 MED ORDER — IPRATROPIUM-ALBUTEROL 0.5-2.5 (3) MG/3ML IN SOLN
3.0000 mL | Freq: Three times a day (TID) | RESPIRATORY_TRACT | Status: DC
  Administered 2021-11-11: 3 mL via RESPIRATORY_TRACT
  Filled 2021-11-10: qty 3

## 2021-11-10 MED ORDER — FUROSEMIDE 10 MG/ML IJ SOLN
40.0000 mg | Freq: Once | INTRAMUSCULAR | Status: AC
  Administered 2021-11-10: 40 mg via INTRAVENOUS
  Filled 2021-11-10: qty 4

## 2021-11-10 MED ORDER — POTASSIUM CHLORIDE 20 MEQ PO PACK: 40.0000 meq | PACK | Freq: Once | ORAL | Status: DC

## 2021-11-10 MED ORDER — POTASSIUM CHLORIDE 10 MEQ/50ML IV SOLN
10.0000 meq | INTRAVENOUS | Status: AC
  Administered 2021-11-10 (×4): 10 meq via INTRAVENOUS
  Filled 2021-11-10 (×4): qty 50

## 2021-11-10 MED ORDER — POTASSIUM CHLORIDE 20 MEQ PO PACK
40.0000 meq | PACK | Freq: Once | ORAL | Status: AC
  Administered 2021-11-10: 40 meq
  Filled 2021-11-10: qty 2

## 2021-11-10 MED ORDER — GLUCAGON HCL RDNA (DIAGNOSTIC) 1 MG IJ SOLR
INTRAMUSCULAR | Status: AC
  Filled 2021-11-10: qty 1

## 2021-11-10 MED ORDER — DEXTROSE 50 % IV SOLN
INTRAVENOUS | Status: AC
  Administered 2021-11-10: 25 mL
  Filled 2021-11-10: qty 50

## 2021-11-10 NOTE — Progress Notes (Signed)
NAME:  Duane Richardson, MRN:  [id number], DOB:  06/10/1955, LOS: 66 ADMISSION DATE:  11/18/2021, CONSULTATION DATE:  11/17/2021 REFERRING MD:  Terrilee Croak, MD CHIEF COMPLAINT:  Hemoptysis, Respiratory arrest   History of Present Illness:  66 year old with tobacco abuse with COPD who presents with cough, hemoptysis, weight loss, fatigue. Imaging markedly abnormal and PCCM consulted. CTA neg for PE with large right pleural effusion and RUL mass measuring 5.3 x 3.8 cm with mass effect of right mainstem, BI, RML and RLLL with pleural based nodules, centrilobular emphysema. Had thoracentesis on 9/8 with 2L exudative bloody fluid 275.  CT A/P with probable hepatic, left adrenal and left perinrenal mets. Underwent EBUS 9/8 with sampling of 4R. He remained in the hospital for work-up for abdominal pain and constipation  9/10 patient had worsening respiratory status requiring 15L O2 with concern for aspiration. Rapid response called and patient had agonal breathing. Pulses reported intact. He was intubated on the floor. PCCM consulted for transfer.  Pertinent  Medical History  COPD DJD post THA HTN HLD  Significant Hospital Events: Including procedures, antibiotic start and stop dates in addition to other pertinent events   9/7 Admitted and thoracentesis 9/8 EBUS 9/10 Respiratory arrest and intubated on floor 9/11 Central line placed 9/12 Bedside US guided right pleural thoracentesis 9/14 Chest tube placed  Interim History / Subjective:  Overnight able to wean off Levophed overnight and continuing on vasopressin.  Nursing staff noted he is having significant scrotal swelling.  Objective   Blood pressure (!) 84/72, pulse 67, temperature 97.6 F (36.4 C), temperature source Oral, resp. rate 20, height 6\' 2"  (1.88 m), weight 63.5 kg, SpO2 100 %.    Vent Mode: PRVC FiO2 (%):  [40 %] 40 % Set Rate:  [20 bmp] 20 bmp Vt Set:  [640 mL] 640 mL PEEP:  [5 cmH20-8 cmH20] 8 cmH20 Pressure Support:  [10  cmH20] 10 cmH20 Plateau Pressure:  [25 cmH20-27 cmH20] 26 cmH20   Intake/Output Summary (Last 24 hours) at 11/10/2021 0718 Last data filed at 11/10/2021 0500 Gross per 24 hour  Intake 733.06 ml  Output 2085 ml  Net -1351.94 ml    Filed Weights   11/08/21 0500 11/09/21 0500 11/10/21 0302  Weight: 62 kg 63.2 kg 63.5 kg   Physical Exam: General: Critically ill-appearing, thin male sedated HENT: , AT, ETT in place Eyes: No scleral icterus.  Mydriasis, reactive to light. Respiratory: Mechanical ventilation appreciated.  Bilateral rhonchi, no wheezing or crackles or rales.  Right chest tube in place Cardiovascular: regular rate and rhythm, No M,G,R GI: distended, soft, distant bowel sounds. GU: Scrotal swelling Extremities: Lower extremities cool to touch, dry.  Edema of lower extremities as well as upper extremities Neuro: Sedated.  Not following commands  Resolved Hospital Problem list    Assessment & Plan:   Acute encephalopathy secondary respiratory arrest Acute hypoxemic respiratory failure secondary to suspected aspiration, post-obstructive pna and pleural effusion Continue spontaneous breathing trials and will wean sedatives as able.  Continue and hopefully extubate early next week. --Intubated. --Full vent support --Antibiotics as per below --Follow blood,, pleural, and resp cx --PAD protocol for RASS goal 0 --VAP --triglycerides stable, will check again tomorrow morning  Septic shock secondary unknown cause, post-obstructive pna vs UTI Adrenal Insufficiency secondary to metastatic adenocarcinoma.  Leukocytosis stable at 60.  He has had negative blood cultures as well as pleural fluid cultures.  1 respiratory culture grew moderate Candida albicans which is likely normal flora.Marland Kitchen  Doing well since addition of stress dose steroids. --Follow up cultures --Continue Zosyn day 3/7 --Wean pressors as able, MAP goal over 65 --Continue stress dose steroids, hydrocortisone 100  mg twice daily  Non-oliguric AKI 2/2 septic shock-resolved Urinary tract infection Hemoglobinuria Patient with 800 cc urinary retention yesterday in and out catheter performed.  This morning retained 400 cc.  If continues to retain today we will place Foley catheter. --trend Cr --Abx as per above --Every shift bladder scans --Strict I/O  Metabolic acidosis secondary to shock Anion gap is normal.  Decreased bicarb likely in the setting of adrenal insufficiency.  Anasarca Once normotensive off of vasopressors will diuresis.  He is likely third spacing with his hypoalbuminemia. --Diuresis once allowed per blood pressure  Normocytic Anemia Thrombocytopenia Likely in setting of acute illness and malignancy. Do not suspect active bleeding. --Monitor for any evidence of acute bleeding --Continue PPI  Electrolyte derangements Potassium of 3.0.  Mag and Phos normalized.  Calcium of 7.2 corrected to 9.2. Likely in setting of nutritional insufficiency.  Continue to hold tube feeds until resolution of ileus. 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  Repositioned yesterday morning and had adequate drainage from chest tube after. --Will need outpatient follow up with heme/onc -- Discuss goals of care with patient and sister once extubated  Ileus  Emesis Continues to have output in NG tube, held tube feeds yesterday.  Will restart when able and low threshold for CT imaging of the abdomen if any changes on exam. -SSI and semglee 5 U BID, adjust as needed glucose goal of 140-1 80 -Continue to hold tube feeds, restart when able  Best Practice   Diet/type: Hold feeds DVT prophylaxis: Subcu heparin GI prophylaxis: PPI Lines: Central line, a line. Right chest tube Foley:  External Foley catheter Code Status:  full code Last date of multidisciplinary goals of care discussion -discussed with sister on 9/16.  Labs   CBC: Recent Labs  Lab 11/06/21 0016 11/06/21 0402 11/07/21 0342 11/08/21 0430 11/09/21 0420 11/09/21 1053 11/10/21 0309  WBC 44.3*  --  48.3* 66.3* 61.4*  --  60.9*  NEUTROABS 36.0*  --  40.6* 61.0* 54.6*  --  56.6*  HGB 10.3*   < > 10.5* 9.4* 8.8* 9.5* 8.6*  HCT 29.3*   < > 30.6* 27.6* 25.4* 28.0* 25.0*  MCV 92.7  --  92.7 93.6 92.0  --  91.9  PLT 221  --  186 133* 113*  --  127*   < > = values in this interval not displayed.     Basic Metabolic Panel: Recent Labs  Lab 11/06/21 0016 11/06/21 0402 11/07/21 0342 11/07/21 1127 11/08/21 0430 11/08/21 1736 11/09/21 0420 11/09/21 1053 11/09/21 1607 11/10/21 0309  NA 139   < > 143  --  141  --  141 142  --  142  K 3.9   < > 3.4*  --  4.2  --  3.8 3.9  --  3.0*  CL 110  --  115*  --  113*  --  112*  --   --  113*  CO2 18*  --  19*  --  20*  --  20*  --   --  20*  GLUCOSE 94  --  153*  --  185*  --  150*  --   --  115*  BUN 40*  --  37*  --  32*  --  28*  --   --  31*  CREATININE 1.28*  --  1.01  --  0.92  --  0.91  --   --  0.96  CALCIUM 7.3*  --  7.5*  --  7.3*  --  7.5*  --   --  7.2*  MG 2.4   < > 2.3   < > 1.8 2.2 2.1  --  2.0 2.1  PHOS 3.5   < > 2.0*   < > 1.6* 4.7* 4.1  --  3.2 3.4   < > = values in this interval not displayed.    GFR: Estimated Creatinine Clearance: 66.1 mL/min (by C-G formula based on SCr of 0.96 mg/dL). Recent Labs  Lab 11/06/21 1656 11/07/21 0342 11/07/21 0921 11/08/21 0430 11/08/21 0743 11/08/21 1138 11/09/21 0420 11/10/21 0309  PROCALCITON  --   --  12.04 8.45  --   --   --   --   WBC  --  48.3*  --  66.3*  --   --  61.4* 60.9*  LATICACIDVEN 2.2* 2.3*  --   --  2.2* 2.0*  --   --      Liver Function Tests: Recent Labs  Lab 11/03/21 1236 11/04/21 1614 11/08/21 0430  AST 22  --  25  ALT 7  --  10  ALKPHOS 67  --  63  BILITOT 0.9   --  0.6  PROT 4.9* 4.8* 4.4*  ALBUMIN 1.8*  --  <1.5*    Recent Labs  Lab 11/08/21 0430  LIPASE 16    No results for input(s): "AMMONIA" in the last 168 hours.  ABG    Component Value Date/Time   PHART 7.454 (H) 11/09/2021 1053   PCO2ART 28.5 (L) 11/09/2021 1053   PO2ART 56 (L) 11/09/2021 1053   HCO3 20.1 11/09/2021 1053   TCO2 21 (L) 11/09/2021 1053   ACIDBASEDEF 3.0 (H) 11/09/2021 1053   O2SAT 92 11/09/2021 1053     Coagulation Profile: No results for input(s): "INR", "PROTIME" in the last 168 hours.  Cardiac Enzymes: No results for input(s): "CKTOTAL", "CKMB", "CKMBINDEX", "TROPONINI" in the last 168 hours.  HbA1C: Hgb A1c MFr Bld  Date/Time Value Ref Range Status  11/07/2021 04:27 PM 5.7 (H) 4.8 - 5.6 % Final    Comment:    (NOTE) Pre diabetes:          5.7%-6.4%  Diabetes:              >6.4%  Glycemic control for   <7.0% adults with diabetes     CBG: Recent Labs  Lab 11/09/21 1540 11/09/21 1943 11/09/21 2315 11/10/21 0259 11/10/21 0300  GLUCAP 78 122* 113* <10* 77     Review of Systems:   Unable to obtain due to critical status  Past Medical History:  He,  has a past medical history of Allergy, Anxiety, Arthritis, Asthma, Atherosclerosis, Bipolar 1 disorder ([place]), Cataract, COPD (chronic obstructive pulmonary disease) ([place]), Depression, Elevated PSA, ETOH abuse, GERD (gastroesophageal reflux disease), Hyperlipidemia, Hypertension, and Right hip pain (04/13/2019).   Surgical History:   Past Surgical History:  Procedure Laterality Date   BACK SURGERY     lunbar   BRONCHIAL NEEDLE ASPIRATION BIOPSY  10/27/2021   Procedure: BRONCHIAL NEEDLE ASPIRATION BIOPSIES;  Surgeon: Candee Furbish, MD;  Location: [facility];  Service: Pulmonary;;   COLONOSCOPY  12/21/09   WEX:HBZJ papilla otherwise normal/ pancolonic diverticula/mutiple colonic poylps   COLONOSCOPY WITH ESOPHAGOGASTRODUODENOSCOPY (EGD) N/A 10/20/2012   Procedure: COLONOSCOPY WITH  ESOPHAGOGASTRODUODENOSCOPY (EGD);  Surgeon: Daneil Dolin, MD;  [facility];  Service: Endoscopy;  Laterality: N/A;  10;15   CYSTOSCOPY N/A 01/31/2013   Procedure: CYSTOSCOPY FLEXIBLE;  Surgeon: Marissa Nestle, MD;  Location: [facility];  Service: Urology;  Laterality: N/A;  I would like to do this around 1 pm on monday.    HARDWARE REMOVAL Left 06/07/2013   Procedure: HARDWARE REMOVAL;  Surgeon: Mcarthur Rossetti, MD;  Location: [facility];  Service: Orthopedics;  Laterality: Left;   INTRAMEDULLARY (IM) NAIL INTERTROCHANTERIC Right 04/14/2019   Procedure: INTRAMEDULLARY (IM) NAIL INTERTROCHANTRIC;  Surgeon: Leandrew Koyanagi, MD;  Location: [facility];  Service: Orthopedics;  Laterality: Right;   IR THORACENTESIS ASP PLEURAL SPACE W/IMG GUIDE  10/27/2021   ORIF HIP FRACTURE Left 11/24/2012   Procedure: OPEN REDUCTION INTERNAL FIXATION HIP;  Surgeon: Sanjuana Kava, MD;  Location: [facility];  Service: Orthopedics;  Laterality: Left;   SPINE SURGERY     TOTAL HIP ARTHROPLASTY Left 06/07/2013   Procedure: REMOVE HARDWARE LEFT HIP AND LEFT TOTAL HIP ARTHROPLASTY ANTERIOR APPROACH;  Surgeon: Mcarthur Rossetti, MD;  Location: [facility];  Service: Orthopedics;  Laterality: Left;   TRANSURETHRAL RESECTION OF PROSTATE N/A 03/22/2013   Procedure: TRANSURETHRAL RESECTION OF THE PROSTATE (TURP);  Surgeon: Marissa Nestle, MD;  [facility];  Service: Urology;  Laterality: N/A;   VIDEO BRONCHOSCOPY WITH ENDOBRONCHIAL ULTRASOUND N/A 11/10/2021   Procedure: VIDEO BRONCHOSCOPY WITH ENDOBRONCHIAL ULTRASOUND;  Surgeon: Candee Furbish, MD;  Location: [facility];  Service: Pulmonary;  Laterality: N/A;     Social History:   reports that he has been smoking cigarettes. He has a 15.00 pack-year smoking history. He has never used smokeless tobacco. He reports that he does not currently use alcohol. He reports that he does not currently use drugs after having used the following drugs: Marijuana and Cocaine.   Family  History:  His family history includes Mental illness in his sister. There is no history of Colon cancer.   Allergies No Known Allergies   Home Medications  Prior to Admission medications   Medication Sig Start Date End Date Taking? Authorizing Provider  acetaminophen (TYLENOL) 325 MG tablet Take 650 mg by mouth every 6 (six) hours as needed.   Yes [provider]  Carboxymethylcellul-Glycerin (CLEAR EYES FOR DRY EYES) 1-0.25 % SOLN Apply 1-2 drops to eye 2 (two) times daily as needed.   Yes [provider]  ibuprofen (ADVIL) 200 MG tablet Take 200 mg by mouth every 6 (six) hours as needed for moderate pain.   Yes [provider]  PROAIR HFA 108 (90 Base) MCG/ACT inhaler INHALE 2 PUFFS INTO THE LUNGS EVERY 4 TO 6 HOURS AS NEEDED. Patient taking differently: Inhale 2 puffs into the lungs every 4 (four) hours as needed for wheezing or shortness of breath. 08/28/16  Yes Susy Frizzle, MD  SYMBICORT 160-4.5 MCG/ACT inhaler Inhale 1 puff into the lungs 2 (two) times daily. 02/08/19  Yes [provider]  diclofenac Sodium (VOLTAREN) 1 % GEL Apply 2 g topically 4 (four) times daily. 11/04/20   Tacy Learn, PA-C  sertraline (ZOLOFT) 25 MG tablet Take 25 mg by mouth daily. 07/10/21   [provider]  tiZANidine (ZANAFLEX) 2 MG tablet Take 2 mg by mouth 2 (  two) times daily as needed. 05/31/19   [provider]    [place]  Internal Medicine Resident PGY-3 [place]  Pager: [phone]

## 2021-11-11 ENCOUNTER — Inpatient Hospital Stay (HOSPITAL_COMMUNITY): Payer: Medicare Other

## 2021-11-11 DIAGNOSIS — C3491 Malignant neoplasm of unspecified part of right bronchus or lung: Secondary | ICD-10-CM | POA: Diagnosis not present

## 2021-11-11 LAB — CBC WITH DIFFERENTIAL/PLATELET
Abs Immature Granulocytes: 0.9 10*3/uL — ABNORMAL HIGH (ref 0.00–0.07)
Basophils Absolute: 0 10*3/uL (ref 0.0–0.1)
Basophils Relative: 0 %
Eosinophils Absolute: 0 10*3/uL (ref 0.0–0.5)
Eosinophils Relative: 0 %
HCT: 25.9 % — ABNORMAL LOW (ref 39.0–52.0)
Hemoglobin: 8.7 g/dL — ABNORMAL LOW (ref 13.0–17.0)
Lymphocytes Relative: 3 %
Lymphs Abs: 1.3 10*3/uL (ref 0.7–4.0)
MCH: 31.1 pg (ref 26.0–34.0)
MCHC: 33.6 g/dL (ref 30.0–36.0)
MCV: 92.5 fL (ref 80.0–100.0)
Monocytes Absolute: 0.9 10*3/uL (ref 0.1–1.0)
Monocytes Relative: 2 %
Myelocytes: 1 %
Neutro Abs: 40.9 10*3/uL — ABNORMAL HIGH (ref 1.7–7.7)
Neutrophils Relative %: 93 %
Platelets: 158 10*3/uL (ref 150–400)
Promyelocytes Relative: 1 %
RBC: 2.8 MIL/uL — ABNORMAL LOW (ref 4.22–5.81)
RDW: 14.2 % (ref 11.5–15.5)
WBC: 44 10*3/uL — ABNORMAL HIGH (ref 4.0–10.5)
nRBC: 0 % (ref 0.0–0.2)
nRBC: 0 /100 WBC

## 2021-11-11 LAB — BASIC METABOLIC PANEL
Anion gap: 11 (ref 5–15)
BUN: 35 mg/dL — ABNORMAL HIGH (ref 8–23)
CO2: 20 mmol/L — ABNORMAL LOW (ref 22–32)
Calcium: 7.5 mg/dL — ABNORMAL LOW (ref 8.9–10.3)
Chloride: 114 mmol/L — ABNORMAL HIGH (ref 98–111)
Creatinine, Ser: 1.07 mg/dL (ref 0.61–1.24)
GFR, Estimated: >60 mL/min (ref >60)
Glucose, Bld: 90 mg/dL (ref 70–99)
Potassium: 3.9 mmol/L (ref 3.5–5.1)
Sodium: 145 mmol/L (ref 135–145)

## 2021-11-11 LAB — GLUCOSE, CAPILLARY
Glucose-Capillary: 91 mg/dL (ref 70–99)
Glucose-Capillary: 82 mg/dL (ref 70–99)
Glucose-Capillary: 84 mg/dL (ref 70–99)
Glucose-Capillary: 113 mg/dL — ABNORMAL HIGH (ref 70–99)
Glucose-Capillary: 98 mg/dL (ref 70–99)
Glucose-Capillary: 111 mg/dL — ABNORMAL HIGH (ref 70–99)
Glucose-Capillary: 126 mg/dL — ABNORMAL HIGH (ref 70–99)

## 2021-11-11 LAB — MAGNESIUM
Magnesium: 2 mg/dL (ref 1.7–2.4)
Magnesium: 2.2 mg/dL (ref 1.7–2.4)
Magnesium: 2.1 mg/dL (ref 1.7–2.4)

## 2021-11-11 LAB — PHOSPHORUS
Phosphorus: 3.4 mg/dL (ref 2.5–4.6)
Phosphorus: 4 mg/dL (ref 2.5–4.6)
Phosphorus: 3.9 mg/dL (ref 2.5–4.6)

## 2021-11-11 LAB — TRIGLYCERIDES: Triglycerides: 248 mg/dL — ABNORMAL HIGH (ref <150)

## 2021-11-11 MED ORDER — ALBUMIN HUMAN 25 % IV SOLN
25.0000 g | Freq: Four times a day (QID) | INTRAVENOUS | Status: AC
  Administered 2021-11-11 – 2021-11-12 (×3): 25 g via INTRAVENOUS
  Filled 2021-11-11 (×3): qty 100

## 2021-11-11 MED ORDER — PREDNISONE 20 MG PO TABS
40.0000 mg | ORAL_TABLET | Freq: Every day | ORAL | Status: DC
  Administered 2021-11-11 – 2021-11-15 (×5): 40 mg
  Filled 2021-11-11 (×5): qty 2

## 2021-11-11 MED ORDER — VITAL AF 1.2 CAL PO LIQD
1000.0000 mL | ORAL | Status: DC
  Administered 2021-11-12: 1000 mL

## 2021-11-11 MED ORDER — POTASSIUM CHLORIDE 20 MEQ PO PACK
40.0000 meq | PACK | Freq: Once | ORAL | Status: AC
  Administered 2021-11-11: 40 meq
  Filled 2021-11-11: qty 2

## 2021-11-11 MED ORDER — ARFORMOTEROL TARTRATE 15 MCG/2ML IN NEBU
15.0000 ug | INHALATION_SOLUTION | Freq: Two times a day (BID) | RESPIRATORY_TRACT | Status: DC
  Administered 2021-11-11 – 2021-11-15 (×9): 15 ug via RESPIRATORY_TRACT
  Filled 2021-11-11 (×9): qty 2

## 2021-11-11 MED ORDER — FUROSEMIDE 10 MG/ML IJ SOLN
40.0000 mg | Freq: Two times a day (BID) | INTRAMUSCULAR | Status: AC
  Administered 2021-11-11 (×2): 40 mg via INTRAVENOUS
  Filled 2021-11-11 (×2): qty 4

## 2021-11-11 MED ORDER — REVEFENACIN 175 MCG/3ML IN SOLN
175.0000 ug | Freq: Every day | RESPIRATORY_TRACT | Status: DC
  Administered 2021-11-11 – 2021-11-15 (×5): 175 ug via RESPIRATORY_TRACT
  Filled 2021-11-11 (×5): qty 3

## 2021-11-11 MED ORDER — FUROSEMIDE 10 MG/ML IJ SOLN: 40.0000 mg | Freq: Once | INTRAMUSCULAR | Status: DC

## 2021-11-11 MED ORDER — SODIUM CHLORIDE 0.9% FLUSH
10.0000 mL | Freq: Two times a day (BID) | INTRAVENOUS | Status: DC
  Administered 2021-11-11 – 2021-11-14 (×8): 10 mL

## 2021-11-11 NOTE — Progress Notes (Addendum)
Nutrition Follow-up / Consult  DOCUMENTATION CODES:   Underweight  INTERVENTION:   Tube feeding via Cortrak tube: Vital AF 1.2 at 10 ml/h  If tolerating TF well tomorrow, recommend increase by 10 ml every 4 hours to goal rate of 65 ml/h (1560 ml per day) to provide 1872 kcal, 117 gm protein, 1265 ml free water daily.  MVI with minerals 1 tablet crushed and given via Cortrak tube once daily.   NUTRITION DIAGNOSIS:   Increased nutrient needs related to chronic illness as evidenced by estimated needs. - Ongoing   GOAL:   Patient will meet greater than or equal to 90% of their needs - Progressing  MONITOR:   Vent status, TF tolerance, Labs  REASON FOR ASSESSMENT:   Consult Enteral/tube feeding initiation and management  ASSESSMENT:   66 y.o. male presented to the ED with coughing up blood. PMH includes COPD, tobacco use, HTN, and GERD. Pt admitted with new found lung mass.  Tube feeding on hold since 9/15 afternoon d/t emesis, suspected ileus. OG tube was placed to suction; 300 ml output over the past 24 hours. Received MD Consult for initiation of trickle tube feedings. Cortrak tube in place with tip in the distal stomach. Vital AF 1.2 has been ordered at 10 ml/h.   Patient remains intubated on ventilator support. MV: 16.4 L/min Temp (24hrs), Avg:98.4 F (36.9 C), Min:97.3 F (36.3 C), Max:99 F (37.2 C)  Propofol at 12.8 ml/h providing 338 kcal from lipid.  Labs reviewed.  CBG: [phone]  Medications reviewed and include Protonix, Miralax, Precedex, fentanyl, Levophed, vasopressin.   R chest tube output 80 ml x 24 hours UOP 1505 ml x 24 hours I/O + 5.8 L since admission  Intake/Output Summary (Last 24 hours) at 11/11/2021 1542 Last data filed at 11/11/2021 1500 Gross per 24 hour  Intake 366.46 ml  Output 1335 ml  Net -968.54 ml     Diet Order:   Diet Order             Diet NPO time specified  Diet effective now                   EDUCATION  NEEDS:   Not appropriate for education at this time  Skin:  Skin Assessment: Reviewed RN Assessment (MASD L throat)  Last BM:  9/18 type 7  Height:   Ht Readings from Last 1 Encounters:  11/03/21 6\' 2"  (1.88 m)   Weight:   Wt Readings from Last 1 Encounters:  11/11/21 63.1 kg   Ideal Body Weight:  86.4 kg  BMI:  Body mass index is 17.86 kg/m.  Estimated Nutritional Needs:  Kcal:  1800-2000 Protein:  90-105 grams Fluid:  >/= 1.8 L   Lucas Mallow RD, LDN, CNSC Please refer to Amion for contact information.

## 2021-11-11 NOTE — TOC Progression Note (Signed)
Transition of Care [facility]) - Progression Note    Patient Details  Name: Duane Richardson MRN: [id number] Date of Birth: 21-Jan-1956  [facility] CM/SW Contact  Tom-Johnson, Renea Ee, RN Phone Number: 11/11/2021, 4:17 PM  Clinical Narrative:     Patient continues to be intubated. On IV abx. Patient has been weaned off vasopressor support. Chest tube in place. Plan for possible extubation tomorrow. GOC continues with family. CM will continue to follow with needs as patient progresses towards discharge.        Expected Discharge Plan and Services                                                 Social Determinants of Health (SDOH) Interventions    Readmission Risk Interventions    07/04/2019   11:33 AM 04/20/2019   10:53 AM  Readmission Risk Prevention Plan  Transportation Screening Complete Complete  PCP or Specialist Appt within 5-7 Days  Not Complete  Not Complete comments  plan for SNF  Home Care Screening  Complete  Medication Review (RN CM) Complete Referral to Pharmacy

## 2021-11-11 NOTE — Progress Notes (Addendum)
NAME:  Duane Richardson, MRN:  [id number], DOB:  1955-10-30, LOS: 11 ADMISSION DATE:  11/02/2021, CONSULTATION DATE:  Nov 24, 2021 REFERRING MD:  Lorin Glass, MD CHIEF COMPLAINT:  Hemoptysis, Respiratory arrest   History of Present Illness:  66 year old with tobacco abuse with COPD who presents with cough, hemoptysis, weight loss, fatigue. Imaging markedly abnormal and PCCM consulted. CTA neg for PE with large right pleural effusion and RUL mass measuring 5.3 x 3.8 cm with mass effect of right mainstem, BI, RML and RLLL with pleural based nodules, centrilobular emphysema. Had thoracentesis on 9/8 with 2L exudative bloody fluid 275.  CT A/P with probable hepatic, left adrenal and left perinrenal mets. Underwent EBUS 9/8 with sampling of 4R. He remained in the hospital for work-up for abdominal pain and constipation  9/10 patient had worsening respiratory status requiring 15L O2 with concern for aspiration. Rapid response called and patient had agonal breathing. Pulses reported intact. He was intubated on the floor. PCCM consulted for transfer.  Pertinent  Medical History  COPD DJD post THA HTN HLD  Significant Hospital Events: Including procedures, antibiotic start and stop dates in addition to other pertinent events   9/7 Admitted and thoracentesis 9/8 EBUS 9/10 Respiratory arrest and intubated on floor 9/11 Central line placed 9/12 Bedside US guided right pleural thoracentesis 9/14 Chest tube placed  Interim History / Subjective:  Off all sedatives overnight, following commands.  Objective   Blood pressure (!) 69/56, pulse (!) 120, temperature (!) 97.3 F (36.3 C), temperature source Axillary, resp. rate (!) 25, height 6\' 2"  (1.88 m), weight 63.1 kg, SpO2 94 %.    Vent Mode: PSV;CPAP FiO2 (%):  [30 %-40 %] 30 % Set Rate:  [20 bmp] 20 bmp Vt Set:  [640 mL] 640 mL PEEP:  [5 cmH20-8 cmH20] 5 cmH20 Pressure Support:  [10 cmH20] 10 cmH20 Plateau Pressure:  [18 cmH20-28 cmH20] 18 cmH20    Intake/Output Summary (Last 24 hours) at 11/11/2021 0950 Last data filed at 11/11/2021 0900 Gross per 24 hour  Intake 557.29 ml  Output 1960 ml  Net -1402.71 ml   Filed Weights   11/09/21 0500 11/10/21 0302 11/11/21 0329  Weight: 63.2 kg 63.5 kg 63.1 kg   Physical Exam: General: Critically ill-appearing, thin male HENT: [place], AT, ETT in place Eyes: No scleral icterus. Tracking appropriately. Respiratory: Mechanical ventilation appreciated.  Bilateral rhonchi, no wheezing or crackles or rales.  Right chest tube in place Cardiovascular: regular rate and rhythm, No M,G,R GI: Soft, distended Extremities: Edema of upper and lower extremities.  Neuro: following commands; sticking out tongue, moving extremities on command.   Resolved Hospital Problem list    Assessment & Plan:   Acute encephalopathy secondary respiratory arrest Acute hypoxemic respiratory failure secondary to suspected aspiration, post-obstructive pna and pleural effusion Following commands appropriately and will continue SBT this morning. Continue to hold sedatives, continue lasix, and SBT as tolerable. Consider extubation tomorrow if continues to do well today.  -SBT, if does well can extubate tomorrow -Full vent support -Antibiotics as per below -Follow blood,, pleural, and resp cx -Hold sedatives this morning, restart if patient becomes uncomfortable -VAP protocol  Septic shock secondary unknown cause, post-obstructive pna vs UTI Adrenal Insufficiency secondary to metastatic adenocarcinoma.  Leukocytosis improved 60>44.   He has had negative blood cultures as well as pleural fluid cultures.  One of his respiratory culture grew moderate Candida albicans which is likely normal flora.  -Follow up cultures -Continue Zosyn day 4/7 -Wean pressors  as able, MAP goal over 65 -Transition to prednisone 40 daily  Non-oliguric AKI 2/2 septic shock-resolved Urinary tract infection Hemoglobinuria Cr stable and at  baseline. Continue to diurese.  -trend Cr -Abx as per above -Every shift bladder scans -Strict I/O  Metabolic acidosis secondary to shock Anion gap is normal.  Decreased bicarb likely in the setting of adrenal insufficiency. -daily bmp -treat adrenal insufficiency as per above  Anasarca Continue diuresis today, improvement in hypervolemia.  -Lasix 40 mg IV BID  Normocytic Anemia Thrombocytopenia-resolved Hgb stable. Likely in setting of acute illness and malignancy. Do not suspect active bleeding. -Monitor for any evidence of acute bleeding -Continue PPI  Electrolyte derangements Potassium, mag and phos normalized.  Calcium of 7.2 corrected to 9.2. Likely in setting of nutritional insufficiency.  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  Continues to have adequate drainage from chest tube.  -Will need outpatient follow up with heme/onc - Discuss goals of care with patient and sister once extubated  Ileus  Emesis BM yesterday evening. Will restart trickle tube feeds today.  -SSI, adjust as needed with restarting feeds glucose goal of 140-180 -Continue bowel regimen  Best Practice   Diet/type: tricklke feeds DVT prophylaxis: Subcu heparin GI prophylaxis: PPI Lines: Central line, a line. Right chest tube Foley: External Foley catheter Code Status:  full code Last date of multidisciplinary goals of care discussion -discussed with sister on 9/16. Will call and update today  Labs   CBC: Recent Labs  Lab 11/07/21 0342 11/08/21 0430 11/09/21 0420 11/09/21 1053 11/10/21 0309 11/11/21 0329  WBC 48.3* 66.3* 61.4*  --  60.9* 44.0*  NEUTROABS 40.6* 61.0* 54.6*  --  56.6* 40.9*  HGB 10.5* 9.4* 8.8* 9.5*  8.6* 8.7*  HCT 30.6* 27.6* 25.4* 28.0* 25.0* 25.9*  MCV 92.7 93.6 92.0  --  91.9 92.5  PLT 186 133* 113*  --  127* 158    Basic Metabolic Panel: Recent Labs  Lab 11/08/21 0430 11/08/21 1736 11/09/21 0420 11/09/21 1053 11/09/21 1607 11/10/21 0309 11/10/21 1634 11/11/21 0329  NA 141  --  141 142  --  142 143 145  K 4.2  --  3.8 3.9  --  3.0* 4.0 3.9  CL 113*  --  112*  --   --  113* 114* 114*  CO2 20*  --  20*  --   --  20* 21* 20*  GLUCOSE 185*  --  150*  --   --  115* 107* 90  BUN 32*  --  28*  --   --  31* 33* 35*  CREATININE 0.92  --  0.91  --   --  0.96 0.96 1.07  CALCIUM 7.3*  --  7.5*  --   --  7.2* 7.5* 7.5*  MG 1.8   < > 2.1  --  2.0 2.1 2.1 2.0  PHOS 1.6*   < > 4.1  --  3.2 3.4 2.8 3.4   < > = values in this interval not displayed.   GFR: Estimated Creatinine Clearance: 59 mL/min (by C-G formula based on SCr of 1.07 mg/dL). Recent Labs  Lab 11/06/21 1656 11/07/21 4166 11/07/21 0630 11/08/21 0430 11/08/21 1601 11/08/21 1138 11/09/21 0420 11/10/21 0309 11/11/21 0932  PROCALCITON  --   --  12.04 8.45  --   --   --   --   --   WBC  --  48.3*  --  66.3*  --   --  61.4* 60.9* 44.0*  LATICACIDVEN 2.2* 2.3*  --   --  2.2* 2.0*  --   --   --     Liver Function Tests: Recent Labs  Lab 11/04/21 1614 11/08/21 0430  AST  --  25  ALT  --  10  ALKPHOS  --  63  BILITOT  --  0.6  PROT 4.8* 4.4*  ALBUMIN  --  <1.5*   Recent Labs  Lab 11/08/21 0430  LIPASE 16   No results for input(s): "AMMONIA" in the last 168 hours.  ABG    Component Value Date/Time   PHART 7.454 (H) 11/09/2021 1053   PCO2ART 28.5 (L) 11/09/2021 1053   PO2ART 56 (L) 11/09/2021 1053   HCO3 20.1 11/09/2021 1053   TCO2 21 (L) 11/09/2021 1053   ACIDBASEDEF 3.0 (H) 11/09/2021 1053   O2SAT 92 11/09/2021 1053     Coagulation Profile: No results for input(s): "INR", "PROTIME" in the last 168 hours.  Cardiac Enzymes: No results for input(s): "CKTOTAL", "CKMB", "CKMBINDEX", "TROPONINI"  in the last 168 hours.  HbA1C: Hgb A1c MFr Bld  Date/Time Value Ref Range Status  11/07/2021 04:27 PM 5.7 (H) 4.8 - 5.6 % Final    Comment:    (NOTE) Pre diabetes:          5.7%-6.4%  Diabetes:              >6.4%  Glycemic control for   <7.0% adults with diabetes     CBG: Recent Labs  Lab 11/10/21 1514 11/10/21 1909 11/10/21 2307 11/11/21 0309 11/11/21 0802  GLUCAP 106* 100* 95 82 84    Review of Systems:   Unable to obtain due to critical status  Past Medical History:  He,  has a past medical history of Allergy, Anxiety, Arthritis, Asthma, Atherosclerosis, Bipolar 1 disorder (HCC), Cataract, COPD (chronic obstructive pulmonary disease) (HCC), Depression, Elevated PSA, ETOH abuse, GERD (gastroesophageal reflux disease), Hyperlipidemia, Hypertension, and Right hip pain (04/13/2019).   Surgical History:   Past Surgical History:  Procedure Laterality Date   BACK SURGERY     lunbar   BRONCHIAL NEEDLE ASPIRATION BIOPSY  10/29/2021   Procedure: BRONCHIAL NEEDLE ASPIRATION BIOPSIES;  Surgeon: Lorin Glass, MD;  Location: [facility] ENDOSCOPY;  Service: Pulmonary;;   COLONOSCOPY  12/21/09   ZOX:WRUE papilla otherwise normal/ pancolonic diverticula/mutiple colonic poylps   COLONOSCOPY WITH ESOPHAGOGASTRODUODENOSCOPY (EGD) N/A 10/20/2012   Procedure: COLONOSCOPY WITH ESOPHAGOGASTRODUODENOSCOPY (EGD);  Surgeon: Corbin Ade, MD;  Location: [facility] ENDO SUITE;  Service: Endoscopy;  Laterality: N/A;  10;15   CYSTOSCOPY N/A 01/31/2013   Procedure: CYSTOSCOPY FLEXIBLE;  Surgeon: Ky Barban, MD;  Location: [facility] ORS;  Service: Urology;  Laterality: N/A;  I would like to do this around 1 pm on monday.    HARDWARE REMOVAL Left 06/07/2013   Procedure: HARDWARE REMOVAL;  Surgeon: Kathryne Hitch, MD;  Location: [facility] OR;  Service: Orthopedics;  Laterality: Left;   INTRAMEDULLARY (IM) NAIL INTERTROCHANTERIC Right 04/14/2019   Procedure: INTRAMEDULLARY (IM) NAIL INTERTROCHANTRIC;  Surgeon: Tarry Kos, MD;  Location: [facility] OR;  Service: Orthopedics;  Laterality: Right;   IR THORACENTESIS ASP PLEURAL SPACE W/IMG GUIDE  10/29/2021   ORIF HIP FRACTURE Left 11/24/2012   Procedure: OPEN REDUCTION INTERNAL  FIXATION HIP;  Surgeon: Darreld Mclean, MD;  Location: [facility] ORS;  Service: Orthopedics;  Laterality: Left;   SPINE SURGERY     TOTAL HIP ARTHROPLASTY Left 06/07/2013   Procedure: REMOVE HARDWARE LEFT HIP AND LEFT TOTAL HIP ARTHROPLASTY ANTERIOR APPROACH;  Surgeon: Kathryne Hitch, MD;  Location: [facility] OR;  Service: Orthopedics;  Laterality: Left;   TRANSURETHRAL RESECTION OF PROSTATE N/A 03/22/2013   Procedure: TRANSURETHRAL RESECTION OF THE PROSTATE (TURP);  Surgeon: Ky Barban, MD;  Location: [facility] ORS;  Service: Urology;  Laterality: N/A;   VIDEO BRONCHOSCOPY WITH ENDOBRONCHIAL ULTRASOUND N/A 11/21/2021   Procedure: VIDEO BRONCHOSCOPY WITH ENDOBRONCHIAL ULTRASOUND;  Surgeon: Lorin Glass, MD;  Location: [facility] ENDOSCOPY;  Service: Pulmonary;  Laterality: N/A;     Social History:   reports that he has been smoking cigarettes. He has a 15.00 pack-year smoking history. He has never used smokeless tobacco. He reports that he does not currently use alcohol. He reports that he does not currently use drugs after having used the following drugs: Marijuana and Cocaine.   Family History:  His family history includes Mental illness in his sister. There is no history of Colon cancer.   Allergies No Known Allergies   Home Medications  Prior to Admission medications   Medication Sig Start Date End Date Taking? Authorizing Provider  acetaminophen (TYLENOL) 325 MG tablet Take 650 mg by mouth every 6 (six) hours as needed.   Yes [provider]  Carboxymethylcellul-Glycerin (CLEAR EYES FOR DRY EYES) 1-0.25 % SOLN Apply 1-2 drops to eye 2 (two) times daily as needed.   Yes [provider]  ibuprofen (ADVIL) 200 MG tablet Take 200 mg by mouth every 6 (six) hours as needed for moderate  pain.   Yes [provider]  PROAIR HFA 108 (90 Base) MCG/ACT inhaler INHALE 2 PUFFS INTO THE LUNGS EVERY 4 TO 6 HOURS AS NEEDED. Patient taking differently: Inhale 2 puffs into the lungs every 4 (four) hours as needed for wheezing or shortness of breath. 08/28/16  Yes Donita Brooks, MD  SYMBICORT 160-4.5 MCG/ACT inhaler Inhale 1 puff into the lungs 2 (two) times daily. 02/08/19  Yes [provider]  diclofenac Sodium (VOLTAREN) 1 % GEL Apply 2 g topically 4 (four) times daily. 11/04/20   Jeannie Fend, PA-C  sertraline (ZOLOFT) 25 MG tablet Take 25 mg by mouth daily. 07/10/21   [provider]  tiZANidine (ZANAFLEX) 2 MG tablet Take 2 mg by mouth 2 (two) times daily as needed. 05/31/19   [provider]    Thalia Bloodgood DO  Internal Medicine Resident PGY-3 [place]  Pager: [phone]

## 2021-11-12 DIAGNOSIS — C3491 Malignant neoplasm of unspecified part of right bronchus or lung: Secondary | ICD-10-CM | POA: Diagnosis not present

## 2021-11-12 LAB — BASIC METABOLIC PANEL
Anion gap: 12 (ref 5–15)
Anion gap: 9 (ref 5–15)
BUN: 43 mg/dL — ABNORMAL HIGH (ref 8–23)
BUN: 37 mg/dL — ABNORMAL HIGH (ref 8–23)
CO2: 20 mmol/L — ABNORMAL LOW (ref 22–32)
CO2: 19 mmol/L — ABNORMAL LOW (ref 22–32)
Calcium: 7.8 mg/dL — ABNORMAL LOW (ref 8.9–10.3)
Calcium: 7.6 mg/dL — ABNORMAL LOW (ref 8.9–10.3)
Chloride: 116 mmol/L — ABNORMAL HIGH (ref 98–111)
Chloride: 121 mmol/L — ABNORMAL HIGH (ref 98–111)
Creatinine, Ser: 1.23 mg/dL (ref 0.61–1.24)
Creatinine, Ser: 1.11 mg/dL (ref 0.61–1.24)
GFR, Estimated: >60 mL/min (ref >60)
GFR, Estimated: >60 mL/min (ref >60)
Glucose, Bld: 98 mg/dL (ref 70–99)
Glucose, Bld: 151 mg/dL — ABNORMAL HIGH (ref 70–99)
Potassium: 2.6 mmol/L — CL (ref 3.5–5.1)
Potassium: 4 mmol/L (ref 3.5–5.1)
Sodium: 148 mmol/L — ABNORMAL HIGH (ref 135–145)
Sodium: 149 mmol/L — ABNORMAL HIGH (ref 135–145)

## 2021-11-12 LAB — GLUCOSE, CAPILLARY
Glucose-Capillary: 118 mg/dL — ABNORMAL HIGH (ref 70–99)
Glucose-Capillary: 119 mg/dL — ABNORMAL HIGH (ref 70–99)
Glucose-Capillary: 140 mg/dL — ABNORMAL HIGH (ref 70–99)
Glucose-Capillary: 141 mg/dL — ABNORMAL HIGH (ref 70–99)
Glucose-Capillary: 95 mg/dL (ref 70–99)
Glucose-Capillary: 97 mg/dL (ref 70–99)

## 2021-11-12 LAB — HEMOGLOBIN AND HEMATOCRIT, BLOOD
HCT: 25 % — ABNORMAL LOW (ref 39.0–52.0)
Hemoglobin: 8.4 g/dL — ABNORMAL LOW (ref 13.0–17.0)

## 2021-11-12 LAB — CBC WITH DIFFERENTIAL/PLATELET
Abs Immature Granulocytes: 0 10*3/uL (ref 0.00–0.07)
Basophils Absolute: 0 10*3/uL (ref 0.0–0.1)
Basophils Relative: 0 %
Eosinophils Absolute: 0 10*3/uL (ref 0.0–0.5)
Eosinophils Relative: 0 %
HCT: 22.3 % — ABNORMAL LOW (ref 39.0–52.0)
Hemoglobin: 7.4 g/dL — ABNORMAL LOW (ref 13.0–17.0)
Lymphocytes Relative: 1 %
Lymphs Abs: 0.4 10*3/uL — ABNORMAL LOW (ref 0.7–4.0)
MCH: 31 pg (ref 26.0–34.0)
MCHC: 33.2 g/dL (ref 30.0–36.0)
MCV: 93.3 fL (ref 80.0–100.0)
Monocytes Absolute: 0.4 10*3/uL (ref 0.1–1.0)
Monocytes Relative: 1 %
Neutro Abs: 36.4 10*3/uL — ABNORMAL HIGH (ref 1.7–7.7)
Neutrophils Relative %: 98 %
Platelets: 165 10*3/uL (ref 150–400)
RBC: 2.39 MIL/uL — ABNORMAL LOW (ref 4.22–5.81)
RDW: 14.2 % (ref 11.5–15.5)
WBC: 37.1 10*3/uL — ABNORMAL HIGH (ref 4.0–10.5)
nRBC: 0 % (ref 0.0–0.2)
nRBC: 0 /100 WBC

## 2021-11-12 LAB — PHOSPHORUS
Phosphorus: 2.6 mg/dL (ref 2.5–4.6)
Phosphorus: 2.6 mg/dL (ref 2.5–4.6)

## 2021-11-12 LAB — MAGNESIUM
Magnesium: 2.1 mg/dL (ref 1.7–2.4)
Magnesium: 2 mg/dL (ref 1.7–2.4)

## 2021-11-12 LAB — PREPARE RBC (CROSSMATCH)

## 2021-11-12 MED ORDER — POTASSIUM CHLORIDE 10 MEQ/50ML IV SOLN
10.0000 meq | INTRAVENOUS | Status: AC
  Administered 2021-11-12 (×4): 10 meq via INTRAVENOUS
  Filled 2021-11-12 (×4): qty 50

## 2021-11-12 MED ORDER — NOREPINEPHRINE 4 MG/250ML-% IV SOLN
0.0000 ug/min | INTRAVENOUS | Status: DC
  Administered 2021-11-12 – 2021-11-14 (×3): 2 ug/min via INTRAVENOUS
  Filled 2021-11-12 (×3): qty 250

## 2021-11-12 MED ORDER — POTASSIUM CHLORIDE 20 MEQ PO PACK
40.0000 meq | PACK | Freq: Once | ORAL | Status: AC
  Administered 2021-11-12: 40 meq
  Filled 2021-11-12: qty 2

## 2021-11-12 MED ORDER — VITAL AF 1.2 CAL PO LIQD
1000.0000 mL | ORAL | Status: DC
  Administered 2021-11-12 – 2021-11-15 (×5): 1000 mL

## 2021-11-12 MED ORDER — SODIUM CHLORIDE 0.9% IV SOLUTION: Freq: Once | INTRAVENOUS | Status: AC

## 2021-11-12 NOTE — Progress Notes (Signed)
[facility] ADULT ICU REPLACEMENT PROTOCOL   The patient does apply for the [facility] Adult ICU Electrolyte Replacment Protocol based on the criteria listed below:   1.Exclusion criteria: TCTS patients, ECMO patients, and Dialysis patients 2. Is GFR >/= 30 ml/min? Yes.    Patient's GFR today is >60 3. Is SCr </= 2? Yes.   Patient's SCr is 1.23 mg/dL 4. Did SCr increase >/= 0.5 in 24 hours? No. 5.Pt's weight >40kg  Yes.   6. Abnormal electrolyte(s): K+ 2.6  7. Electrolytes replaced per protocol 8.  Call MD STAT for K+ </= 2.5, Phos </= 1, or Mag </= 1 Physician:  Bethann Humble.   Margaret Pyle 11/12/2021 6:07 AM

## 2021-11-12 NOTE — Progress Notes (Signed)
NAME:  Duane Richardson, MRN:  [id number], DOB:  [birth year], LOS: 12 ADMISSION DATE:  11/07/2021, CONSULTATION DATE:  11/18/2021 REFERRING MD:  Terrilee Croak, MD CHIEF COMPLAINT:  Hemoptysis, Respiratory arrest   History of Present Illness:  66 year old with tobacco abuse with COPD who presents with cough, hemoptysis, weight loss, fatigue. Imaging markedly abnormal and PCCM consulted. CTA neg for PE with large right pleural effusion and RUL mass measuring 5.3 x 3.8 cm with mass effect of right mainstem, BI, RML and RLLL with pleural based nodules, centrilobular emphysema. Had thoracentesis on 9/8 with 2L exudative bloody fluid 275.  CT A/P with probable hepatic, left adrenal and left perinrenal mets. Underwent EBUS 9/8 with sampling of 4R. He remained in the hospital for work-up for abdominal pain and constipation  9/10 patient had worsening respiratory status requiring 15L O2 with concern for aspiration. Rapid response called and patient had agonal breathing. Pulses reported intact. He was intubated on the floor. PCCM consulted for transfer.  Pertinent  Medical History  COPD DJD post THA HTN HLD  Significant Hospital Events: Including procedures, antibiotic start and stop dates in addition to other pertinent events   9/7 Admitted and thoracentesis 9/8 EBUS 9/10 Respiratory arrest and intubated on floor 9/11 Central line placed 9/12 Bedside US guided right pleural thoracentesis 9/14 Chest tube placed  Interim History / Subjective:  Off all sedatives overnight, following commands. Did require potassium supplementation.   Objective   Blood pressure 90/60, pulse (!) 103, temperature 98.9 F (37.2 C), temperature source Oral, resp. rate 20, height 6\' 2"  (1.88 m), weight 63.3 kg, SpO2 99 %.    Vent Mode: PRVC FiO2 (%):  [30 %] 30 % Set Rate:  [20 bmp] 20 bmp Vt Set:  [640 mL-650 mL] 650 mL PEEP:  [5 cmH20] 5 cmH20 Pressure Support:  [5 cmH20-10 cmH20] 5 cmH20 Plateau Pressure:  [20 cmH20-28  cmH20] 28 cmH20   Intake/Output Summary (Last 24 hours) at 11/12/2021 0635 Last data filed at 11/12/2021 0528 Gross per 24 hour  Intake 518.96 ml  Output 1560 ml  Net -1041.04 ml    Filed Weights   11/10/21 0302 11/11/21 0329 11/12/21 0500  Weight: 63.5 kg 63.1 kg 63.3 kg   Physical Exam: General: Critically ill-appearing, thin male HENT: [place], AT, ETT in place Eyes: No scleral icterus. Tracking appropriately. Respiratory: Mechanical ventilation appreciated.  Bilateral rhonchi, no wheezing or crackles or rales.  Right chest tube in place Cardiovascular: regular rate and rhythm, No M,G,R GI: Soft, distended Extremities: Edema of upper and lower extremities.  Neuro: following commands; sticking out tongue, moving extremities on command.   Resolved Hospital Problem list    Assessment & Plan:   Acute encephalopathy secondary respiratory arrest Acute hypoxemic respiratory failure secondary to suspected aspiration, post-obstructive pna and pleural effusion Following commands appropriately however does have worsening rhonchi and on SBT this morning using some accessory muscles.  We will hold off on extubation today as I do not believe that he would be successful and require immediate reintubation.  Discussed at bedside with him that we will hold off as he is high risk for intubation.  He shook his head yes at this.  Discussed with patient's Sister Kathaleen Maser who would like to be present for the extubation.  We discussed that over the next few days we will try to optimize his respiratory status to give him the best chance of extubation.  She acknowledges that he is high risk for reintubation  as well as this being a terminal extubation. -Full vent support -Antibiotics as per below -Follow blood,, pleural, and resp cx -Hold sedatives, restart if patient becomes uncomfortable -Plan for extubation Friday, 9/22 when family is available to be present -VAP protocol  Septic shock secondary  unknown cause, post-obstructive pna vs UTI Adrenal Insufficiency secondary to metastatic adenocarcinoma.  Leukocytosis improved 44>37. Negative cultures. Respiratory culture grew moderate Candida albicans which is likely normal flora.  -Follow up cultures -Continue Zosyn day 5/7 -Wean pressors as able, MAP goal over 65 -Continue prednisone 40 daily  Non-oliguric AKI 2/2 septic shock-resolved Urinary tract infection Hemoglobinuria Cr  with increase 1.23 from 1.07.  Continue to hold diuretics. -trend Cr -Abx as per above -Every shift bladder scans -Strict I/O  Metabolic acidosis secondary to shock Anion gap is normal.  Decreased bicarb likely in the setting of adrenal insufficiency. -daily bmp -treat adrenal insufficiency as per above  Anasarca Improvement of volume status, will hold Lasix today with increasing creatinine hypotension this morning.  Normocytic Anemia Thrombocytopenia-resolved Hgb stable however with hypotension we will give 1 unit of PRBCs.  Discussed this with his sister Kathaleen Maser.. Likely in setting of acute illness and malignancy. Do not suspect active bleeding. -Monitor for any evidence of acute bleeding -Continue PPI  Electrolyte derangements Potassium supplemented this morning we will give additional dose this afternoon and repeat this afternoon.  Calcium of 7.8 corrected to 9.2. Likely in setting of nutritional insufficiency.  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  Decreasing drainage from chest tube -Will need outpatient follow up with heme/onc\  Ileus  Emesis BM yesterday evening.  Increase tube feeds -SSI, adjust as needed with restarting feeds  glucose goal of 140-180 -Continue bowel regimen  Advance goals of care Discussed current status with patient's sister and acknowledges and understands his severe illness and likelihood of needing reintubation as well as possibility of terminal extubation.  She request that once extubated we discussed with the patient if he would like to be reintubated.  Best Practice   Diet/type: Increase feeds DVT prophylaxis: Subcu heparin GI prophylaxis: PPI Lines: Central line, a line. Right chest tube Foley: Foley catheter Code Status:  full code Last date of multidisciplinary goals of care discussion -discussed with sister on 9/19.   Labs   CBC: Recent Labs  Lab 11/08/21 0430 11/09/21 0420 11/09/21 1053 11/10/21 0309 11/11/21 0329 11/12/21 0455  WBC 66.3* 61.4*  --  60.9* 44.0* 37.1*  NEUTROABS 61.0* 54.6*  --  56.6* 40.9* PENDING  HGB 9.4* 8.8* 9.5* 8.6* 8.7* 7.4*  HCT 27.6* 25.4* 28.0* 25.0* 25.9* 22.3*  MCV 93.6 92.0  --  91.9 92.5 93.3  PLT 133* 113*  --  127* 158 165     Basic Metabolic Panel: Recent Labs  Lab 11/09/21 0420 11/09/21 1053 11/09/21 1607 11/10/21 0309 11/10/21 1634 11/11/21 0329 11/11/21 1230 11/11/21 1711 11/12/21 0455  NA 141 142  --  142 143 145  --   --  148*  K 3.8 3.9  --  3.0* 4.0 3.9  --   --  2.6*  CL 112*  --   --  113* 114* 114*  --   --  116*  CO2  20*  --   --  20* 21* 20*  --   --  20*  GLUCOSE 150*  --   --  115* 107* 90  --   --  98  BUN 28*  --   --  31* 33* 35*  --   --  43*  CREATININE 0.91  --   --  0.96 0.96 1.07  --   --  1.23  CALCIUM 7.5*  --   --  7.2* 7.5* 7.5*  --   --  7.8*  MG 2.1  --    < > 2.1 2.1 2.0 2.2 2.1 2.1  PHOS 4.1  --    < > 3.4 2.8 3.4 4.0 3.9 2.6   < > = values in this interval not displayed.    GFR: Estimated Creatinine Clearance: 51.5 mL/min (by C-G formula based on SCr of 1.23 mg/dL). Recent Labs  Lab 11/06/21 1656 11/07/21 0342 11/07/21 0921 11/08/21 0430 11/08/21 0743 11/08/21 1138 11/09/21 0420  11/10/21 0309 11/11/21 0329 11/12/21 0455  PROCALCITON  --   --  12.04 8.45  --   --   --   --   --   --   WBC  --  48.3*  --  66.3*  --   --  61.4* 60.9* 44.0* 37.1*  LATICACIDVEN 2.2* 2.3*  --   --  2.2* 2.0*  --   --   --   --      Liver Function Tests: Recent Labs  Lab 11/08/21 0430  AST 25  ALT 10  ALKPHOS 63  BILITOT 0.6  PROT 4.4*  ALBUMIN <1.5*    Recent Labs  Lab 11/08/21 0430  LIPASE 16    No results for input(s): "AMMONIA" in the last 168 hours.  ABG    Component Value Date/Time   PHART 7.454 (H) 11/09/2021 1053   PCO2ART 28.5 (L) 11/09/2021 1053   PO2ART 56 (L) 11/09/2021 1053   HCO3 20.1 11/09/2021 1053   TCO2 21 (L) 11/09/2021 1053   ACIDBASEDEF 3.0 (H) 11/09/2021 1053   O2SAT 92 11/09/2021 1053     Coagulation Profile: No results for input(s): "INR", "PROTIME" in the last 168 hours.  Cardiac Enzymes: No results for input(s): "CKTOTAL", "CKMB", "CKMBINDEX", "TROPONINI" in the last 168 hours.  HbA1C: Hgb A1c MFr Bld  Date/Time Value Ref Range Status  11/07/2021 04:27 PM 5.7 (H) 4.8 - 5.6 % Final    Comment:    (NOTE) Pre diabetes:          5.7%-6.4%  Diabetes:              >6.4%  Glycemic control for   <7.0% adults with diabetes     CBG: Recent Labs  Lab 11/11/21 1146 11/11/21 1529 11/11/21 1923 11/11/21 2318 11/12/21 0325  GLUCAP 113* 98 111* 126* 97     Review of Systems:   Unable to obtain due to critical status  Past Medical History:  He,  has a past medical history of Allergy, Anxiety, Arthritis, Asthma, Atherosclerosis, Bipolar 1 disorder ([place]), Cataract, COPD (chronic obstructive pulmonary disease) ([place]), Depression, Elevated PSA, ETOH abuse, GERD (gastroesophageal reflux disease), Hyperlipidemia, Hypertension, and Right hip pain (04/13/2019).   Surgical History:   Past Surgical History:  Procedure Laterality Date   BACK SURGERY     lunbar   BRONCHIAL NEEDLE ASPIRATION BIOPSY  11/10/2021   Procedure: BRONCHIAL  NEEDLE ASPIRATION BIOPSIES;  Surgeon: Candee Furbish, MD;  Location: [place];  Service: Pulmonary;;   COLONOSCOPY  12/21/09   UEA:VWUJ papilla otherwise normal/ pancolonic diverticula/mutiple colonic poylps   COLONOSCOPY WITH ESOPHAGOGASTRODUODENOSCOPY (EGD) N/A 10/20/2012   Procedure: COLONOSCOPY WITH ESOPHAGOGASTRODUODENOSCOPY (EGD);  Surgeon: Daneil Dolin, MD;  [facility];  Service: Endoscopy;  Laterality: N/A;  10;15   CYSTOSCOPY N/A 01/31/2013   Procedure: CYSTOSCOPY FLEXIBLE;  Surgeon: Marissa Nestle, MD;  Location: [facility];  Service: Urology;  Laterality: N/A;  I would like to do this around 1 pm on monday.    HARDWARE REMOVAL Left 06/07/2013   Procedure: HARDWARE REMOVAL;  Surgeon: Mcarthur Rossetti, MD;  Location: [facility];  Service: Orthopedics;  Laterality: Left;   INTRAMEDULLARY (IM) NAIL INTERTROCHANTERIC Right 04/14/2019   Procedure: INTRAMEDULLARY (IM) NAIL INTERTROCHANTRIC;  Surgeon: Leandrew Koyanagi, MD;  Location: [place];  Service: Orthopedics;  Laterality: Right;   IR THORACENTESIS ASP PLEURAL SPACE W/IMG GUIDE  10/27/2021   ORIF HIP FRACTURE Left 11/24/2012   Procedure: OPEN REDUCTION INTERNAL FIXATION HIP;  Surgeon: Sanjuana Kava, MD;  Location: [facility];  Service: Orthopedics;  Laterality: Left;   SPINE SURGERY     TOTAL HIP ARTHROPLASTY Left 06/07/2013   Procedure: REMOVE HARDWARE LEFT HIP AND LEFT TOTAL HIP ARTHROPLASTY ANTERIOR APPROACH;  Surgeon: Mcarthur Rossetti, MD;  Location: [facility];  Service: Orthopedics;  Laterality: Left;   TRANSURETHRAL RESECTION OF PROSTATE N/A 03/22/2013   Procedure: TRANSURETHRAL RESECTION OF THE PROSTATE (TURP);  Surgeon: Marissa Nestle, MD;  Location: [facility];  Service: Urology;  Laterality: N/A;   VIDEO BRONCHOSCOPY WITH ENDOBRONCHIAL ULTRASOUND N/A 11/18/2021   Procedure: VIDEO BRONCHOSCOPY WITH ENDOBRONCHIAL ULTRASOUND;  Surgeon: Candee Furbish, MD;  [facility];  Service: Pulmonary;  Laterality: N/A;      Social History:   reports that he has been smoking cigarettes. He has a 15.00 pack-year smoking history. He has never used smokeless tobacco. He reports that he does not currently use alcohol. He reports that he does not currently use drugs after having used the following drugs: Marijuana and Cocaine.   Family History:  His family history includes Mental illness in his sister. There is no history of Colon cancer.   Allergies No Known Allergies   Home Medications  Prior to Admission medications   Medication Sig Start Date End Date Taking? Authorizing Provider  acetaminophen (TYLENOL) 325 MG tablet Take 650 mg by mouth every 6 (six) hours as needed.   Yes [provider]  Carboxymethylcellul-Glycerin (CLEAR EYES FOR DRY EYES) 1-0.25 % SOLN Apply 1-2 drops to eye 2 (two) times daily as needed.   Yes [provider]  ibuprofen (ADVIL) 200 MG tablet Take 200 mg by mouth every 6 (six) hours as needed for moderate pain.   Yes [provider]  PROAIR HFA 108 (90 Base) MCG/ACT inhaler INHALE 2 PUFFS INTO THE LUNGS EVERY 4 TO 6 HOURS AS NEEDED. Patient taking differently: Inhale 2 puffs into the lungs every 4 (four) hours as needed for wheezing or shortness of breath. 08/28/16  Yes Susy Frizzle, MD  SYMBICORT 160-4.5 MCG/ACT inhaler Inhale 1 puff into the lungs 2 (two) times daily. 02/08/19  Yes [provider]  diclofenac Sodium (VOLTAREN) 1 % GEL Apply 2 g topically 4 (four) times daily. 11/04/20   Tacy Learn, PA-C  sertraline (ZOLOFT) 25 MG tablet Take 25 mg by mouth daily. 07/10/21   [provider]  tiZANidine (ZANAFLEX) 2 MG tablet Take 2 mg by mouth 2 (two)  times daily as needed. 05/31/19   [provider]    [place]  Internal Medicine Resident PGY-3 [place]  Pager: [phone]

## 2021-11-12 NOTE — Plan of Care (Signed)
  Problem: Education: Goal: Knowledge of General Education information will improve Description: Including pain rating scale, medication(s)/side effects and non-pharmacologic comfort measures Outcome: Progressing   Problem: Health Behavior/Discharge Planning: Goal: Ability to manage health-related needs will improve Outcome: Progressing   Problem: Clinical Measurements: Goal: Ability to maintain clinical measurements within normal limits will improve Outcome: Not Progressing Goal: Will remain free from infection Outcome: Not Progressing Goal: Diagnostic test results will improve Outcome: Not Progressing Goal: Respiratory complications will improve Outcome: Not Progressing Note: R/T new diagnosis of CA with mets. Invasive lines and tubes needed for life support at this time Goal: Cardiovascular complication will be avoided Outcome: Not Progressing   Problem: Activity: Goal: Risk for activity intolerance will decrease Note: R/t bedrest at this time   Problem: Nutrition: Goal: Adequate nutrition will be maintained Outcome: Not Progressing Note: R/t TF needs at this time trickle feeds   Problem: Coping: Goal: Level of anxiety will decrease Outcome: Progressing   Problem: Elimination: Goal: Will not experience complications related to bowel motility Outcome: Not Progressing Note: R/t multiple loose stools Goal: Will not experience complications related to urinary retention Note: R/t foley needs for retention   Problem: Safety: Goal: Ability to remain free from injury will improve Outcome: Not Progressing Note: R/t multiple lines and tubes total care at this time   Problem: Skin Integrity: Goal: Risk for impaired skin integrity will decrease Outcome: Not Progressing Note: R/t immobility   Problem: Activity: Goal: Ability to tolerate increased activity will improve Outcome: Not Progressing   Problem: Respiratory: Goal: Ability to maintain a clear airway and adequate  ventilation will improve Outcome: Not Progressing Note: R/t vent needed at this time    Problem: Role Relationship: Goal: Method of communication will improve Outcome: Not Progressing   Problem: Education: Goal: Ability to describe self-care measures that may prevent or decrease complications (Diabetes Survival Skills Education) will improve Outcome: Not Progressing Goal: Individualized Educational Video(s) Outcome: Not Progressing   Problem: Coping: Goal: Ability to adjust to condition or change in health will improve Outcome: Not Progressing   Problem: Fluid Volume: Goal: Ability to maintain a balanced intake and output will improve Outcome: Not Progressing

## 2021-11-13 ENCOUNTER — Inpatient Hospital Stay (HOSPITAL_COMMUNITY): Payer: Medicare Other

## 2021-11-13 DIAGNOSIS — C3491 Malignant neoplasm of unspecified part of right bronchus or lung: Secondary | ICD-10-CM | POA: Diagnosis not present

## 2021-11-13 LAB — BASIC METABOLIC PANEL
Anion gap: 10 (ref 5–15)
BUN: 34 mg/dL — ABNORMAL HIGH (ref 8–23)
CO2: 21 mmol/L — ABNORMAL LOW (ref 22–32)
Calcium: 8.1 mg/dL — ABNORMAL LOW (ref 8.9–10.3)
Chloride: 122 mmol/L — ABNORMAL HIGH (ref 98–111)
Creatinine, Ser: 1.03 mg/dL (ref 0.61–1.24)
GFR, Estimated: >60 mL/min (ref >60)
Glucose, Bld: 190 mg/dL — ABNORMAL HIGH (ref 70–99)
Potassium: 3.6 mmol/L (ref 3.5–5.1)
Sodium: 153 mmol/L — ABNORMAL HIGH (ref 135–145)

## 2021-11-13 LAB — CBC WITH DIFFERENTIAL/PLATELET
Abs Immature Granulocytes: 0 10*3/uL (ref 0.00–0.07)
Basophils Absolute: 0 10*3/uL (ref 0.0–0.1)
Basophils Relative: 0 %
Eosinophils Absolute: 0.4 10*3/uL (ref 0.0–0.5)
Eosinophils Relative: 1 %
HCT: 23.4 % — ABNORMAL LOW (ref 39.0–52.0)
Hemoglobin: 8.1 g/dL — ABNORMAL LOW (ref 13.0–17.0)
Lymphocytes Relative: 2 %
Lymphs Abs: 0.9 10*3/uL (ref 0.7–4.0)
MCH: 31.8 pg (ref 26.0–34.0)
MCHC: 34.6 g/dL (ref 30.0–36.0)
MCV: 91.8 fL (ref 80.0–100.0)
Monocytes Absolute: 0.4 10*3/uL (ref 0.1–1.0)
Monocytes Relative: 1 %
Neutro Abs: 42.4 10*3/uL — ABNORMAL HIGH (ref 1.7–7.7)
Neutrophils Relative %: 96 %
Platelets: 158 10*3/uL (ref 150–400)
RBC: 2.55 MIL/uL — ABNORMAL LOW (ref 4.22–5.81)
RDW: 14.5 % (ref 11.5–15.5)
WBC: 44.2 10*3/uL — ABNORMAL HIGH (ref 4.0–10.5)
nRBC: 0 /100 WBC
nRBC: 0.1 % (ref 0.0–0.2)

## 2021-11-13 LAB — PHOSPHORUS: Phosphorus: 2 mg/dL — ABNORMAL LOW (ref 2.5–4.6)

## 2021-11-13 LAB — BPAM RBC
Blood Product Expiration Date: 202310132359
ISSUE DATE / TIME: 202309191401
Unit Type and Rh: 6200

## 2021-11-13 LAB — GLUCOSE, CAPILLARY
Glucose-Capillary: 158 mg/dL — ABNORMAL HIGH (ref 70–99)
Glucose-Capillary: 164 mg/dL — ABNORMAL HIGH (ref 70–99)
Glucose-Capillary: 168 mg/dL — ABNORMAL HIGH (ref 70–99)
Glucose-Capillary: 171 mg/dL — ABNORMAL HIGH (ref 70–99)
Glucose-Capillary: 182 mg/dL — ABNORMAL HIGH (ref 70–99)
Glucose-Capillary: 186 mg/dL — ABNORMAL HIGH (ref 70–99)

## 2021-11-13 LAB — TYPE AND SCREEN
ABO/RH(D): A POS
Antibody Screen: NEGATIVE
Unit division: 0

## 2021-11-13 LAB — MRSA NEXT GEN BY PCR, NASAL: MRSA by PCR Next Gen: NOT DETECTED

## 2021-11-13 LAB — MAGNESIUM: Magnesium: 2.1 mg/dL (ref 1.7–2.4)

## 2021-11-13 MED ORDER — ACETAMINOPHEN 325 MG PO TABS
650.0000 mg | ORAL_TABLET | Freq: Four times a day (QID) | ORAL | Status: DC | PRN
  Administered 2021-11-13 – 2021-11-14 (×2): 650 mg
  Filled 2021-11-13 (×2): qty 2

## 2021-11-13 MED ORDER — VANCOMYCIN HCL 1250 MG/250ML IV SOLN
1250.0000 mg | INTRAVENOUS | Status: DC
  Administered 2021-11-14: 1250 mg via INTRAVENOUS
  Filled 2021-11-13 (×2): qty 250

## 2021-11-13 MED ORDER — POTASSIUM PHOSPHATES 15 MMOLE/5ML IV SOLN
15.0000 mmol | Freq: Once | INTRAVENOUS | Status: DC
  Filled 2021-11-13: qty 5

## 2021-11-13 MED ORDER — POTASSIUM CHLORIDE 20 MEQ PO PACK
40.0000 meq | PACK | Freq: Once | ORAL | Status: AC
  Administered 2021-11-13: 40 meq
  Filled 2021-11-13: qty 2

## 2021-11-13 MED ORDER — SODIUM PHOSPHATES 45 MMOLE/15ML IV SOLN
15.0000 mmol | Freq: Once | INTRAVENOUS | Status: AC
  Administered 2021-11-13: 15 mmol via INTRAVENOUS
  Filled 2021-11-13: qty 5

## 2021-11-13 MED ORDER — VANCOMYCIN HCL 1250 MG/250ML IV SOLN
1250.0000 mg | Freq: Once | INTRAVENOUS | Status: AC
  Administered 2021-11-13: 1250 mg via INTRAVENOUS
  Filled 2021-11-13: qty 250

## 2021-11-13 MED ORDER — OXYCODONE HCL 5 MG PO TABS
10.0000 mg | ORAL_TABLET | Freq: Once | ORAL | Status: AC
  Administered 2021-11-13: 10 mg
  Filled 2021-11-13: qty 2

## 2021-11-13 MED ORDER — FREE WATER
200.0000 mL | Status: DC
  Administered 2021-11-13 – 2021-11-15 (×13): 200 mL

## 2021-11-13 NOTE — Progress Notes (Signed)
Pharmacy Electrolyte Replacement  Recent Labs:  Recent Labs    11/13/21 0529 11/13/21 1130  K 3.6  --   MG  --  2.1  PHOS  --  2.0*  CREATININE 1.03  --     Low Critical Values (K </= 2.5, Phos </= 1, Mg </= 1) Present: None  Plan: Replete phos per protocol with 74mmol of NaPhos. K already replaced this AM.

## 2021-11-13 NOTE — Plan of Care (Signed)
Problem: Education: Goal: Knowledge of General Education information will improve Description: Including pain rating scale, medication(s)/side effects and non-pharmacologic comfort measures 11/13/2021 0107 by Neomia Glass, RN Outcome: Not Progressing 11/12/2021 2151 by Neomia Glass, RN Outcome: Not Progressing   Problem: Health Behavior/Discharge Planning: Goal: Ability to manage health-related needs will improve 11/13/2021 0107 by Neomia Glass, RN Outcome: Not Progressing 11/12/2021 2151 by Neomia Glass, RN Outcome: Not Progressing   Problem: Clinical Measurements: Goal: Ability to maintain clinical measurements within normal limits will improve 11/13/2021 0107 by Neomia Glass, RN Outcome: Not Progressing 11/12/2021 2151 by Neomia Glass, RN Outcome: Not Progressing Goal: Will remain free from infection 11/13/2021 0107 by Neomia Glass, RN Outcome: Not Progressing 11/12/2021 2151 by Neomia Glass, RN Outcome: Not Progressing Goal: Diagnostic test results will improve 11/13/2021 0107 by Neomia Glass, RN Outcome: Not Progressing 11/12/2021 2151 by Neomia Glass, RN Outcome: Not Progressing Goal: Respiratory complications will improve 11/13/2021 0107 by Neomia Glass, RN Outcome: Not Progressing 11/12/2021 2151 by Neomia Glass, RN Outcome: Not Progressing Goal: Cardiovascular complication will be avoided 11/13/2021 0107 by Neomia Glass, RN Outcome: Not Progressing 11/12/2021 2151 by Neomia Glass, RN Outcome: Not Progressing   Problem: Activity: Goal: Risk for activity intolerance will decrease 11/13/2021 0107 by Neomia Glass, RN Outcome: Not Progressing 11/12/2021 2151 by Neomia Glass, RN Outcome: Not Progressing   Problem: Nutrition: Goal: Adequate nutrition will be maintained 11/13/2021 0107 by Neomia Glass, RN Outcome: Not Progressing 11/12/2021 2151 by Neomia Glass, RN Outcome: Not Progressing   Problem: Coping: Goal: Level  of anxiety will decrease 11/13/2021 0107 by Neomia Glass, RN Outcome: Not Progressing 11/12/2021 2151 by Neomia Glass, RN Outcome: Not Progressing   Problem: Elimination: Goal: Will not experience complications related to bowel motility 11/13/2021 0107 by Neomia Glass, RN Outcome: Not Progressing 11/12/2021 2151 by Neomia Glass, RN Outcome: Not Progressing Goal: Will not experience complications related to urinary retention 11/13/2021 0107 by Neomia Glass, RN Outcome: Not Progressing 11/12/2021 2151 by Neomia Glass, RN Outcome: Not Progressing   Problem: Pain Managment: Goal: General experience of comfort will improve 11/13/2021 0107 by Neomia Glass, RN Outcome: Not Progressing 11/12/2021 2151 by Neomia Glass, RN Outcome: Not Progressing   Problem: Safety: Goal: Ability to remain free from injury will improve 11/13/2021 0107 by Neomia Glass, RN Outcome: Not Progressing 11/12/2021 2151 by Neomia Glass, RN Outcome: Not Progressing   Problem: Skin Integrity: Goal: Risk for impaired skin integrity will decrease 11/13/2021 0107 by Neomia Glass, RN Outcome: Not Progressing 11/12/2021 2151 by Neomia Glass, RN Outcome: Not Progressing   Problem: Activity: Goal: Ability to tolerate increased activity will improve 11/13/2021 0107 by Neomia Glass, RN Outcome: Not Progressing 11/12/2021 2151 by Neomia Glass, RN Outcome: Not Progressing   Problem: Respiratory: Goal: Ability to maintain a clear airway and adequate ventilation will improve 11/13/2021 0107 by Neomia Glass, RN Outcome: Not Progressing 11/12/2021 2151 by Neomia Glass, RN Outcome: Not Progressing   Problem: Role Relationship: Goal: Method of communication will improve 11/13/2021 0107 by Neomia Glass, RN Outcome: Not Progressing 11/12/2021 2151 by Neomia Glass, RN Outcome: Not Progressing   Problem: Education: Goal: Ability to describe self-care measures that may prevent or  decrease complications (Diabetes Survival Skills Education) will improve 11/13/2021 0107 by Neomia Glass, RN Outcome: Not Progressing 11/12/2021 2151  by Neomia Glass, RN Outcome: Not Progressing Goal: Individualized Educational Video(s) 11/13/2021 0107 by Neomia Glass, RN Outcome: Not Progressing 11/12/2021 2151 by Neomia Glass, RN Outcome: Not Progressing   Problem: Coping: Goal: Ability to adjust to condition or change in health will improve 11/13/2021 0107 by Neomia Glass, RN Outcome: Not Progressing 11/12/2021 2151 by Neomia Glass, RN Outcome: Not Progressing   Problem: Fluid Volume: Goal: Ability to maintain a balanced intake and output will improve 11/13/2021 0107 by Neomia Glass, RN Outcome: Not Progressing 11/12/2021 2151 by Neomia Glass, RN Outcome: Not Progressing   Problem: Health Behavior/Discharge Planning: Goal: Ability to identify and utilize available resources and services will improve 11/13/2021 0107 by Neomia Glass, RN Outcome: Not Progressing 11/12/2021 2151 by Neomia Glass, RN Outcome: Not Progressing Goal: Ability to manage health-related needs will improve 11/13/2021 0107 by Neomia Glass, RN Outcome: Not Progressing 11/12/2021 2151 by Neomia Glass, RN Outcome: Not Progressing   Problem: Metabolic: Goal: Ability to maintain appropriate glucose levels will improve 11/13/2021 0107 by Neomia Glass, RN Outcome: Not Progressing 11/12/2021 2151 by Neomia Glass, RN Outcome: Not Progressing   Problem: Nutritional: Goal: Maintenance of adequate nutrition will improve 11/13/2021 0107 by Neomia Glass, RN Outcome: Not Progressing 11/12/2021 2151 by Neomia Glass, RN Outcome: Not Progressing Goal: Progress toward achieving an optimal weight will improve 11/13/2021 0107 by Neomia Glass, RN Outcome: Not Progressing 11/12/2021 2151 by Neomia Glass, RN Outcome: Not Progressing   Problem: Skin Integrity: Goal: Risk for  impaired skin integrity will decrease 11/13/2021 0107 by Neomia Glass, RN Outcome: Not Progressing 11/12/2021 2151 by Neomia Glass, RN Outcome: Not Progressing   Problem: Tissue Perfusion: Goal: Adequacy of tissue perfusion will improve 11/13/2021 0107 by Neomia Glass, RN Outcome: Not Progressing 11/12/2021 2151 by Neomia Glass, RN Outcome: Not Progressing   Problem: Education: Goal: Ability to describe self-care measures that may prevent or decrease complications (Diabetes Survival Skills Education) will improve 11/13/2021 0107 by Neomia Glass, RN Outcome: Not Progressing 11/12/2021 2151 by Neomia Glass, RN Outcome: Not Progressing Goal: Individualized Educational Video(s) 11/13/2021 0107 by Neomia Glass, RN Outcome: Not Progressing 11/12/2021 2151 by Neomia Glass, RN Outcome: Not Progressing   Problem: Coping: Goal: Ability to adjust to condition or change in health will improve 11/13/2021 0107 by Neomia Glass, RN Outcome: Not Progressing 11/12/2021 2151 by Neomia Glass, RN Outcome: Not Progressing   Problem: Fluid Volume: Goal: Ability to maintain a balanced intake and output will improve 11/13/2021 0107 by Neomia Glass, RN Outcome: Not Progressing 11/12/2021 2151 by Neomia Glass, RN Outcome: Not Progressing   Problem: Health Behavior/Discharge Planning: Goal: Ability to identify and utilize available resources and services will improve 11/13/2021 0107 by Neomia Glass, RN Outcome: Not Progressing 11/12/2021 2151 by Neomia Glass, RN Outcome: Not Progressing Goal: Ability to manage health-related needs will improve 11/13/2021 0107 by Neomia Glass, RN Outcome: Not Progressing 11/12/2021 2151 by Neomia Glass, RN Outcome: Not Progressing   Problem: Metabolic: Goal: Ability to maintain appropriate glucose levels will improve 11/13/2021 0107 by Neomia Glass, RN Outcome: Not Progressing 11/12/2021 2151 by Neomia Glass, RN Outcome:  Not Progressing   Problem: Nutritional: Goal: Maintenance of adequate nutrition will improve 11/13/2021 0107 by Neomia Glass, RN Outcome: Not Progressing 11/12/2021 2151 by Neomia Glass, RN Outcome: Not Progressing Goal: Progress  toward achieving an optimal weight will improve 11/13/2021 0107 by Neomia Glass, RN Outcome: Not Progressing 11/12/2021 2151 by Neomia Glass, RN Outcome: Not Progressing   Problem: Skin Integrity: Goal: Risk for impaired skin integrity will decrease 11/13/2021 0107 by Neomia Glass, RN Outcome: Not Progressing 11/12/2021 2151 by Neomia Glass, RN Outcome: Not Progressing   Problem: Tissue Perfusion: Goal: Adequacy of tissue perfusion will improve 11/13/2021 0107 by Neomia Glass, RN Outcome: Not Progressing 11/12/2021 2151 by Neomia Glass, RN Outcome: Not Progressing

## 2021-11-13 NOTE — Progress Notes (Signed)
[facility] ADULT ICU REPLACEMENT PROTOCOL   The patient does apply for the [facility] Adult ICU Electrolyte Replacment Protocol based on the criteria listed below:   1.Exclusion criteria: TCTS patients, ECMO patients, and Dialysis patients 2. Is GFR >/= 30 ml/min? Yes.    Patient's GFR today is >60 3. Is SCr </= 2? Yes.   Patient's SCr is 1.03 mg/dL 4. Did SCr increase >/= 0.5 in 24 hours? No. 5.Pt's weight >40kg  Yes.   6. Abnormal electrolyte(s): K+ 3.6  7. Electrolytes replaced per protocol 8.  Call MD STAT for K+ </= 2.5, Phos </= 1, or Mag </= 1 Physician:  Theador Hawthorne 11/13/2021 6:43 AM

## 2021-11-13 NOTE — Progress Notes (Signed)
Pharmacy Antibiotic Note - Initial Consult  Duane Richardson is a 66 y.o. male admitted on 10/29/2021 with pneumonia.  Pharmacy has been consulted for Vancomcyin dosing.  Patient remains intubated, but is off pressor support. Spiked mild fever of 100.4 this morning and repeat CXR shows worsening right-sided opacities. WBC has increased 37.1 > 44.2. MRSA PCR on 9/10 was negative, team will empirically restart vancomycin, repeat MRSA PCR today and send respiratory culture.   Renal function is stable at 1.03. Calculated CrCl of 61.7 mL/min.  Plan: Continue Zosyn 3.375g IV Q8hrs for total 7 days Give vancomycin 1250 mg IV x1 dose Start vancomycin 1250 mg IV Q24hrs (eAUC 490, Scr 1.03) F/u MRSA PCR, resp cx, LOT Monitor renal function and clinical status  Height: 6\' 2"  (188 cm) Weight: 63.6 kg (140 lb 3.4 oz) IBW/kg (Calculated) : 82.2  Temp (24hrs), Avg:98.7 F (37.1 C), Min:98 F (36.7 C), Max:100.4 F (38 C)  Recent Labs  Lab 11/06/21 1656 11/07/21 0342 11/08/21 0430 11/08/21 0743 11/08/21 1138 11/09/21 0420 11/10/21 0309 11/10/21 1634 11/11/21 0329 11/12/21 0455 11/12/21 1651 11/13/21 0529  WBC  --  48.3*   < >  --   --  61.4* 60.9*  --  44.0* 37.1*  --  44.2*  CREATININE  --  1.01   < >  --   --  0.91 0.96 0.96 1.07 1.23 1.11 1.03  LATICACIDVEN 2.2* 2.3*  --  2.2* 2.0*  --   --   --   --   --   --   --    < > = values in this interval not displayed.     Estimated Creatinine Clearance: 61.7 mL/min (by C-G formula based on SCr of 1.03 mg/dL).    No Known Allergies  Antimicrobials this admission: Cefepime 9/10 >> 9/15 Vancomycin 9/10 >> 9/12, 9/13 >> 9/14, 9/20 >> Zosyn 9/15 >> (9/21)  Dose adjustments this admission: 9/14 Vancomycin increased to 1000 mg Q24 (Scr improved 1.28 > 1.01)  Microbiology results: 9/8 Pleural fluid: NGTD x 2d 9/10 BCx: NGTD x 2d 9/10 MRSA PCR: negative 9/10: Resp cx: NGTD 9/11 Pleural fluid: NGTD 9/14 Pleural fluid: NGTD 9/20 MRSA PCR:  ordered 9/20 Resp cx: ordered  Thank you for allowing pharmacy to be a part of this patient's care.  Louanne Belton, PharmD, [facility] PGY1 Pharmacy Resident 11/13/2021 10:30 AM

## 2021-11-13 NOTE — Progress Notes (Signed)
NAME:  Duane Richardson, MRN:  [id number], DOB:  14-Jan-1956, LOS: 39 ADMISSION DATE:  10/26/2021, CONSULTATION DATE:  10/28/2021 REFERRING MD:  Terrilee Croak, MD CHIEF COMPLAINT:  Hemoptysis, Respiratory arrest   History of Present Illness:  66 year old with tobacco abuse with COPD who presents with cough, hemoptysis, weight loss, fatigue. Imaging markedly abnormal and PCCM consulted. CTA neg for PE with large right pleural effusion and RUL mass measuring 5.3 x 3.8 cm with mass effect of right mainstem, BI, RML and RLLL with pleural based nodules, centrilobular emphysema. Had thoracentesis on 9/8 with 2L exudative bloody fluid 275.  CT A/P with probable hepatic, left adrenal and left perinrenal mets. Underwent EBUS 9/8 with sampling of 4R. He remained in the hospital for work-up for abdominal pain and constipation  9/10 patient had worsening respiratory status requiring 15L O2 with concern for aspiration. Rapid response called and patient had agonal breathing. Pulses reported intact. He was intubated on the floor. PCCM consulted for transfer.  Pertinent  Medical History  COPD DJD post THA HTN HLD  Significant Hospital Events: Including procedures, antibiotic start and stop dates in addition to other pertinent events   9/7 Admitted and thoracentesis 9/8 EBUS 9/10 Respiratory arrest and intubated on floor 9/11 Central line placed 9/12 Bedside US guided right pleural thoracentesis 9/14 Chest tube placed  Interim History / Subjective:  Despite Seroquel and 2 doses of IV fentanyl overnight did not sleep per nursing staff.  Had some bright red blood suctioned from his tracheal tube this morning.  He remains comfortable in bed and following commands.  Objective   Blood pressure (!) 121/58, pulse (!) 118, temperature 99.5 F (37.5 C), temperature source Axillary, resp. rate (!) 21, height 6\' 2"  (1.88 m), weight 63.6 kg, SpO2 94 %.    Vent Mode: PSV;CPAP FiO2 (%):  [30 %] 30 % Set Rate:  [20 bmp] 20  bmp Vt Set:  [560 mL-650 mL] 650 mL PEEP:  [5 cmH20] 5 cmH20 Pressure Support:  [5 cmH20-10 cmH20] 5 cmH20 Plateau Pressure:  [24 cmH20-33 cmH20] 33 cmH20   Intake/Output Summary (Last 24 hours) at 11/13/2021 0956 Last data filed at 11/13/2021 9379 Gross per 24 hour  Intake 1393.88 ml  Output 1220 ml  Net 173.88 ml    Filed Weights   11/11/21 0329 11/12/21 0500 11/13/21 0341  Weight: 63.1 kg 63.3 kg 63.6 kg   Physical Exam: General: Ill-appearing, thin male HENT: ET tube in place, bright red blood in suction canister Eyes: No scleral icterus. Tracking appropriately. Respiratory: Mechanical ventilation appreciated.  Bilateral rhonchi unchanged. Right chest tube in place Cardiovascular: regular rate and rhythm, No M,G,R GI: soft, distended, non tender Extremities: Edema of upper and lower extremities.  Neuro: Following some commands.  Resolved Hospital Problem list    Assessment & Plan:   Acute encephalopathy secondary respiratory arrest Acute hypoxemic respiratory failure secondary to suspected aspiration, post-obstructive pna and pleural effusion Encephalopathy has improved, he is resting and following commands. Has been doing well with SBT. Continuous to be rhonchorous without any significant improvement.  Repeat chest x-ray this morning with worsening of right sided opacities, this along with him being febrile and a increased leukocytosis will send tracheal aspirate, repeat MRSA, and restart vancomycin.  Tentative plan to extubate on Friday, continue treatments to give him the best chances of remaining extubated. -Full vent support -continue fentanyl PRN and will add oxycodone if needed to keep comfortable -Plan for extubation Friday, 9/22 when family is  available to be present. 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  Febrile overnight with worsening leukocytosis adjust  antibiotics as per above.  -Blood cultures remain negative -Continue Zosyn day 6/7 and start vancomycin -repeat tracheal aspirate cultures, MRSA nasal swab pending -Wean pressors as able, MAP goal over 65 -Continue prednisone 40 daily  Non-oliguric AKI 2/2 septic shock-resolved Urinary tract infection Hemoglobinuria Cr stable.  Continue to hold diuretics with hypernatremia -trend Cr -Abx as per above -Every shift bladder scans -Strict I/O  Metabolic acidosis secondary to shock Bicarb improving -daily bmp -treat adrenal insufficiency as per above  Anasarca Continue to hold Lasix and monitor volume status.  He was net -100 L yesterday.  Normocytic Anemia Thrombocytopenia-resolved Hemoglobin improved to 8.1 from 7.4 after 1 unit PRBC. -Monitor for any evidence of acute bleeding -Continue PPI  Electrolyte derangements Replete potassium and started free water with hypernatremia.  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 Hemoptysis this morning likely secondary to his malignancy.  -Will need outpatient follow up with heme/onc Ileus  Emesis Increased tube feeds, continues to have daily BM's.  -SSI, adjust as needed with restarting feeds glucose goal of 140-180 -Continue bowel regimen  Advance goals of care Discussed current status with patient's sister and acknowledges and understands his severe illness and likelihood of needing reintubation as well as possibility of terminal extubation.  She request that once extubated we discussed with the patient if he would like to be reintubated.  Best Practice   Diet/type: tube feeds DVT prophylaxis: Hold heparin today with hemoptysis  GI prophylaxis: PPI Lines: Central line, a line. Right chest tube Foley: Foley catheter Code Status:  full code Last date of multidisciplinary goals of care  discussion -discussed with sister on 9/19.   Labs   CBC: Recent Labs  Lab 11/09/21 0420 11/09/21 1053 11/10/21 0309 11/11/21 0329 11/12/21 0455 11/12/21 1930 11/13/21 0529  WBC 61.4*  --  60.9* 44.0* 37.1*  --  44.2*  NEUTROABS 54.6*  --  56.6* 40.9* 36.4*  --  42.4*  HGB 8.8*   < > 8.6* 8.7* 7.4* 8.4* 8.1*  HCT 25.4*   < > 25.0* 25.9* 22.3* 25.0* 23.4*  MCV 92.0  --  91.9 92.5 93.3  --  91.8  PLT 113*  --  127* 158 165  --  158   < > = values in this interval not displayed.     Basic Metabolic Panel: Recent Labs  Lab 11/10/21 1634 11/11/21 0329 11/11/21 1230 11/11/21 1711 11/12/21 0455 11/12/21 1651 11/13/21 0529  NA 143 145  --   --  148* 149* 153*  K 4.0 3.9  --   --  2.6* 4.0 3.6  CL 114* 114*  --   --  116* 121* 122*  CO2 21* 20*  --   --  20* 19* 21*  GLUCOSE 107* 90  --   --  98 151* 190*  BUN 33* 35*  --   --  43* 37* 34*  CREATININE 0.96 1.07  --   --  1.23 1.11 1.03  CALCIUM 7.5* 7.5*  --   --  7.8* 7.6* 8.1*  MG 2.1 2.0 2.2 2.1 2.1 2.0  --   PHOS 2.8 3.4 4.0 3.9 2.6 2.6  --     GFR: Estimated Creatinine  Clearance: 61.7 mL/min (by C-G formula based on SCr of 1.03 mg/dL). Recent Labs  Lab 11/06/21 1656 11/07/21 0342 11/07/21 0342 11/07/21 0921 11/08/21 0430 11/08/21 0743 11/08/21 1138 11/09/21 0420 11/10/21 0309 11/11/21 0329 11/12/21 0455 11/13/21 0529  PROCALCITON  --   --   --  12.04 8.45  --   --   --   --   --   --   --   WBC  --  48.3*   < >  --  66.3*  --   --    < > 60.9* 44.0* 37.1* 44.2*  LATICACIDVEN 2.2* 2.3*  --   --   --  2.2* 2.0*  --   --   --   --   --    < > = values in this interval not displayed.     Liver Function Tests: Recent Labs  Lab 11/08/21 0430  AST 25  ALT 10  ALKPHOS 63  BILITOT 0.6  PROT 4.4*  ALBUMIN <1.5*    Recent Labs  Lab 11/08/21 0430  LIPASE 16    No results for input(s): "AMMONIA" in the last 168 hours.  ABG    Component Value Date/Time   PHART 7.454 (H) 11/09/2021 1053    PCO2ART 28.5 (L) 11/09/2021 1053   PO2ART 56 (L) 11/09/2021 1053   HCO3 20.1 11/09/2021 1053   TCO2 21 (L) 11/09/2021 1053   ACIDBASEDEF 3.0 (H) 11/09/2021 1053   O2SAT 92 11/09/2021 1053     Coagulation Profile: No results for input(s): "INR", "PROTIME" in the last 168 hours.  Cardiac Enzymes: No results for input(s): "CKTOTAL", "CKMB", "CKMBINDEX", "TROPONINI" in the last 168 hours.  HbA1C: Hgb A1c MFr Bld  Date/Time Value Ref Range Status  11/07/2021 04:27 PM 5.7 (H) 4.8 - 5.6 % Final    Comment:    (NOTE) Pre diabetes:          5.7%-6.4%  Diabetes:              >6.4%  Glycemic control for   <7.0% adults with diabetes     CBG: Recent Labs  Lab 11/12/21 1520 11/12/21 1927 11/12/21 2331 11/13/21 0306 11/13/21 0806  GLUCAP 119* 140* 141* 171* 164*     Review of Systems:   Unable to obtain due to critical status  Past Medical History:  He,  has a past medical history of Allergy, Anxiety, Arthritis, Asthma, Atherosclerosis, Bipolar 1 disorder ([place]), Cataract, COPD (chronic obstructive pulmonary disease) ([place]), Depression, Elevated PSA, ETOH abuse, GERD (gastroesophageal reflux disease), Hyperlipidemia, Hypertension, and Right hip pain (04/13/2019).   Surgical History:   Past Surgical History:  Procedure Laterality Date   BACK SURGERY     lunbar   BRONCHIAL NEEDLE ASPIRATION BIOPSY  11/07/2021   Procedure: BRONCHIAL NEEDLE ASPIRATION BIOPSIES;  Surgeon: Candee Furbish, MD;  Location: [facility] ENDOSCOPY;  Service: Pulmonary;;   COLONOSCOPY  12/21/09   TYO:MAYO papilla otherwise normal/ pancolonic diverticula/mutiple colonic poylps   COLONOSCOPY WITH ESOPHAGOGASTRODUODENOSCOPY (EGD) N/A 10/20/2012   Procedure: COLONOSCOPY WITH ESOPHAGOGASTRODUODENOSCOPY (EGD);  Surgeon: Daneil Dolin, MD;  Location: [facility] ENDO SUITE;  Service: Endoscopy;  Laterality: N/A;  10;15   CYSTOSCOPY N/A 01/31/2013   Procedure: CYSTOSCOPY FLEXIBLE;  Surgeon: Marissa Nestle, MD;  Location: [facility]  ORS;  Service: Urology;  Laterality: N/A;  I would like to do this around 1 pm on monday.    HARDWARE REMOVAL Left 06/07/2013   Procedure: HARDWARE REMOVAL;  Surgeon: Mcarthur Rossetti, MD;  Location: [place];  Service: Orthopedics;  Laterality: Left;   INTRAMEDULLARY (IM) NAIL INTERTROCHANTERIC Right 04/14/2019   Procedure: INTRAMEDULLARY (IM) NAIL INTERTROCHANTRIC;  Surgeon: Leandrew Koyanagi, MD;  Location: [place];  Service: Orthopedics;  Laterality: Right;   IR THORACENTESIS ASP PLEURAL SPACE W/IMG GUIDE  11/18/2021   ORIF HIP FRACTURE Left 11/24/2012   Procedure: OPEN REDUCTION INTERNAL FIXATION HIP;  Surgeon: Sanjuana Kava, MD;  Location: [facility] ORS;  Service: Orthopedics;  Laterality: Left;   SPINE SURGERY     TOTAL HIP ARTHROPLASTY Left 06/07/2013   Procedure: REMOVE HARDWARE LEFT HIP AND LEFT TOTAL HIP ARTHROPLASTY ANTERIOR APPROACH;  Surgeon: Mcarthur Rossetti, MD;  Location: [place];  Service: Orthopedics;  Laterality: Left;   TRANSURETHRAL RESECTION OF PROSTATE N/A 03/22/2013   Procedure: TRANSURETHRAL RESECTION OF THE PROSTATE (TURP);  Surgeon: Marissa Nestle, MD;  Location: [facility] ORS;  Service: Urology;  Laterality: N/A;   VIDEO BRONCHOSCOPY WITH ENDOBRONCHIAL ULTRASOUND N/A 11/12/2021   Procedure: VIDEO BRONCHOSCOPY WITH ENDOBRONCHIAL ULTRASOUND;  Surgeon: Candee Furbish, MD;  Location: [facility] ENDOSCOPY;  Service: Pulmonary;  Laterality: N/A;     Social History:   reports that he has been smoking cigarettes. He has a 15.00 pack-year smoking history. He has never used smokeless tobacco. He reports that he does not currently use alcohol. He reports that he does not currently use drugs after having used the following drugs: Marijuana and Cocaine.   Family History:  His family history includes Mental illness in his sister. There is no history of Colon cancer.   Allergies No Known Allergies   Home Medications  Prior to Admission medications   Medication Sig Start Date End Date Taking?  Authorizing Provider  acetaminophen (TYLENOL) 325 MG tablet Take 650 mg by mouth every 6 (six) hours as needed.   Yes [provider]  Carboxymethylcellul-Glycerin (CLEAR EYES FOR DRY EYES) 1-0.25 % SOLN Apply 1-2 drops to eye 2 (two) times daily as needed.   Yes [provider]  ibuprofen (ADVIL) 200 MG tablet Take 200 mg by mouth every 6 (six) hours as needed for moderate pain.   Yes [provider]  PROAIR HFA 108 (90 Base) MCG/ACT inhaler INHALE 2 PUFFS INTO THE LUNGS EVERY 4 TO 6 HOURS AS NEEDED. Patient taking differently: Inhale 2 puffs into the lungs every 4 (four) hours as needed for wheezing or shortness of breath. 08/28/16  Yes Susy Frizzle, MD  SYMBICORT 160-4.5 MCG/ACT inhaler Inhale 1 puff into the lungs 2 (two) times daily. 02/08/19  Yes [provider]  diclofenac Sodium (VOLTAREN) 1 % GEL Apply 2 g topically 4 (four) times daily. 11/04/20   Tacy Learn, PA-C  sertraline (ZOLOFT) 25 MG tablet Take 25 mg by mouth daily. 07/10/21   [provider]  tiZANidine (ZANAFLEX) 2 MG tablet Take 2 mg by mouth 2 (two) times daily as needed. 05/31/19   [provider]    Rebecca  Internal Medicine Resident PGY-3 [place]  Pager: [phone]

## 2021-11-13 NOTE — Progress Notes (Signed)
Patient has been referred to [facility]. Patient remains hospitalized. [employer] testing requested per Dr. Delton Coombes. New patient appt schedule pending discharge plan.

## 2021-11-14 DIAGNOSIS — C3491 Malignant neoplasm of unspecified part of right bronchus or lung: Secondary | ICD-10-CM | POA: Diagnosis not present

## 2021-11-14 LAB — CBC WITH DIFFERENTIAL/PLATELET
Abs Immature Granulocytes: 2.07 10*3/uL — ABNORMAL HIGH (ref 0.00–0.07)
Basophils Absolute: 0.2 10*3/uL — ABNORMAL HIGH (ref 0.0–0.1)
Basophils Relative: 0 %
Eosinophils Absolute: 1 10*3/uL — ABNORMAL HIGH (ref 0.0–0.5)
Eosinophils Relative: 2 %
HCT: 20.9 % — ABNORMAL LOW (ref 39.0–52.0)
Hemoglobin: 7.1 g/dL — ABNORMAL LOW (ref 13.0–17.0)
Immature Granulocytes: 4 %
Lymphocytes Relative: 4 %
Lymphs Abs: 1.9 10*3/uL (ref 0.7–4.0)
MCH: 32.1 pg (ref 26.0–34.0)
MCHC: 34 g/dL (ref 30.0–36.0)
MCV: 94.6 fL (ref 80.0–100.0)
Monocytes Absolute: 2.6 10*3/uL — ABNORMAL HIGH (ref 0.1–1.0)
Monocytes Relative: 5 %
Neutro Abs: 45 10*3/uL — ABNORMAL HIGH (ref 1.7–7.7)
Neutrophils Relative %: 85 %
Platelets: 188 10*3/uL (ref 150–400)
RBC: 2.21 MIL/uL — ABNORMAL LOW (ref 4.22–5.81)
RDW: 14.9 % (ref 11.5–15.5)
Smear Review: ADEQUATE
WBC: 52.7 10*3/uL (ref 4.0–10.5)
nRBC: 0.1 % (ref 0.0–0.2)

## 2021-11-14 LAB — BASIC METABOLIC PANEL
Anion gap: 9 (ref 5–15)
BUN: 38 mg/dL — ABNORMAL HIGH (ref 8–23)
CO2: 19 mmol/L — ABNORMAL LOW (ref 22–32)
Calcium: 7.2 mg/dL — ABNORMAL LOW (ref 8.9–10.3)
Chloride: 123 mmol/L — ABNORMAL HIGH (ref 98–111)
Creatinine, Ser: 1.11 mg/dL (ref 0.61–1.24)
GFR, Estimated: >60 mL/min (ref >60)
Glucose, Bld: 158 mg/dL — ABNORMAL HIGH (ref 70–99)
Potassium: 3.4 mmol/L — ABNORMAL LOW (ref 3.5–5.1)
Sodium: 151 mmol/L — ABNORMAL HIGH (ref 135–145)

## 2021-11-14 LAB — GLUCOSE, CAPILLARY
Glucose-Capillary: 121 mg/dL — ABNORMAL HIGH (ref 70–99)
Glucose-Capillary: 146 mg/dL — ABNORMAL HIGH (ref 70–99)
Glucose-Capillary: 160 mg/dL — ABNORMAL HIGH (ref 70–99)
Glucose-Capillary: 160 mg/dL — ABNORMAL HIGH (ref 70–99)
Glucose-Capillary: 194 mg/dL — ABNORMAL HIGH (ref 70–99)
Glucose-Capillary: 199 mg/dL — ABNORMAL HIGH (ref 70–99)

## 2021-11-14 LAB — PHOSPHORUS
Phosphorus: 2.1 mg/dL — ABNORMAL LOW (ref 2.5–4.6)
Phosphorus: 2.4 mg/dL — ABNORMAL LOW (ref 2.5–4.6)

## 2021-11-14 LAB — PROTIME-INR
INR: 1.3 — ABNORMAL HIGH (ref 0.8–1.2)
Prothrombin Time: 16.2 seconds — ABNORMAL HIGH (ref 11.4–15.2)

## 2021-11-14 LAB — MAGNESIUM
Magnesium: 2 mg/dL (ref 1.7–2.4)
Magnesium: 1.8 mg/dL (ref 1.7–2.4)

## 2021-11-14 LAB — TRIGLYCERIDES: Triglycerides: 109 mg/dL (ref <150)

## 2021-11-14 MED ORDER — POTASSIUM CHLORIDE 20 MEQ PO PACK
20.0000 meq | PACK | Freq: Once | ORAL | Status: AC
  Administered 2021-11-14: 20 meq
  Filled 2021-11-14: qty 1

## 2021-11-14 MED ORDER — FLUCONAZOLE IN SODIUM CHLORIDE 400-0.9 MG/200ML-% IV SOLN
400.0000 mg | INTRAVENOUS | Status: DC
  Administered 2021-11-14: 400 mg via INTRAVENOUS
  Filled 2021-11-14 (×2): qty 200

## 2021-11-14 MED ORDER — POTASSIUM PHOSPHATES 15 MMOLE/5ML IV SOLN
15.0000 mmol | Freq: Once | INTRAVENOUS | Status: AC
  Administered 2021-11-14: 15 mmol via INTRAVENOUS
  Filled 2021-11-14: qty 5

## 2021-11-14 MED ORDER — PROPOFOL 1000 MG/100ML IV EMUL
5.0000 ug/kg/min | INTRAVENOUS | Status: DC
  Administered 2021-11-14: 5 ug/kg/min via INTRAVENOUS
  Administered 2021-11-14: 25 ug/kg/min via INTRAVENOUS
  Administered 2021-11-15: 30 ug/kg/min via INTRAVENOUS
  Filled 2021-11-14 (×3): qty 100

## 2021-11-14 MED ORDER — METOLAZONE 5 MG PO TABS
5.0000 mg | ORAL_TABLET | Freq: Once | ORAL | Status: AC
  Administered 2021-11-14: 5 mg
  Filled 2021-11-14: qty 1

## 2021-11-14 MED ORDER — PIPERACILLIN-TAZOBACTAM 3.375 G IVPB
3.3750 g | Freq: Three times a day (TID) | INTRAVENOUS | Status: DC
  Administered 2021-11-14 – 2021-11-15 (×3): 3.375 g via INTRAVENOUS
  Filled 2021-11-14 (×3): qty 50

## 2021-11-14 MED ORDER — FLUCONAZOLE IN SODIUM CHLORIDE 200-0.9 MG/100ML-% IV SOLN: 200.0000 mg | INTRAVENOUS | Status: DC

## 2021-11-14 NOTE — Progress Notes (Signed)
[facility] ADULT ICU REPLACEMENT PROTOCOL   The patient does apply for the [facility] Adult ICU Electrolyte Replacment Protocol based on the criteria listed below:   1.Exclusion criteria: TCTS patients, ECMO patients, and Dialysis patients 2. Is GFR >/= 30 ml/min? Yes.    Patient's GFR today is >60 3. Is SCr </= 2? Yes.   Patient's SCr is 1.11 mg/dL 4. Did SCr increase >/= 0.5 in 24 hours? No. 5.Pt's weight >40kg  Yes.   6. Abnormal electrolyte(s):   K 3.4, Phos 2.1  7. Electrolytes replaced per protocol 8.  Call MD STAT for K+ </= 2.5, Phos </= 1, or Mag </= 1 Physician:  Lowella Bandy R Johnnette Laux 11/14/2021 6:26 AM

## 2021-11-14 NOTE — Progress Notes (Addendum)
11/14/2021 11/14/2021 I saw and evaluated the patient. Discussed with resident and agree with resident's findings and plan as documented in the resident's note.  Awake on vent. Rhonci worse on right. Moves all 4 ext to command Cachetic. Discussed one way extubation with him with transition to comfort should he not tolerate, he agrees. Sister coming in tomorrow to see.  Will heavily sedate w/ propofol until she gets here to allow him to rest.  Give metolazone and some free water, he appears euvolemic Continue chest tube, abx for now; f/u tracheal aspirate  34 min cc time  Erskine Emery MD [facility] Prefer epic messenger for cross cover needs If after hours, please call E-link

## 2021-11-14 NOTE — Progress Notes (Signed)
RT NOTE:   RT called to assess ALINE. RT unable to pull blood back. Redressed and secured with no change. RN contacted MD. MD ordered to discontinue ALINE.

## 2021-11-14 NOTE — Progress Notes (Signed)
NAME:  Duane Richardson, MRN:  [id number], DOB:  [birth year], LOS: 21 ADMISSION DATE:  11/13/2021, CONSULTATION DATE:  11/22/2021 REFERRING MD:  Terrilee Croak, MD CHIEF COMPLAINT:  Hemoptysis, Respiratory arrest   History of Present Illness:  66 year old with tobacco abuse with COPD who presents with cough, hemoptysis, weight loss, fatigue. Imaging markedly abnormal and PCCM consulted. CTA neg for PE with large right pleural effusion and RUL mass measuring 5.3 x 3.8 cm with mass effect of right mainstem, BI, RML and RLLL with pleural based nodules, centrilobular emphysema. Had thoracentesis on 9/8 with 2L exudative bloody fluid 275.  CT A/P with probable hepatic, left adrenal and left perinrenal mets. Underwent EBUS 9/8 with sampling of 4R. He remained in the hospital for work-up for abdominal pain and constipation  9/10 patient had worsening respiratory status requiring 15L O2 with concern for aspiration. Rapid response called and patient had agonal breathing. Pulses reported intact. He was intubated on the floor. PCCM consulted for transfer.  Pertinent  Medical History  COPD DJD post THA HTN HLD  Significant Hospital Events: Including procedures, antibiotic start and stop dates in addition to other pertinent events   9/7 Admitted and thoracentesis 9/8 EBUS 9/10 Respiratory arrest and intubated on floor 9/11 Central line placed 9/12 Bedside US guided right pleural thoracentesis 9/14 Chest tube placed  Interim History / Subjective:  Was able to rest overnight with Seroquel and fentanyl.  Continues to have some hemoptysis per tracheal suction.  When asked this morning if he is in some discomfort he nods his head yes.  When asked if he would like to be reintubated once extubated and he shook his head no.  Objective   Blood pressure (!) 86/62, pulse (!) 104, temperature (!) 100.7 F (38.2 C), temperature source Axillary, resp. rate (!) 29, height 6\' 2"  (1.88 m), weight 61.6 kg, SpO2 93 %.    Vent  Mode: PSV;CPAP FiO2 (%):  [30 %] 30 % Set Rate:  [20 bmp] 20 bmp Vt Set:  [650 mL] 650 mL PEEP:  [5 cmH20] 5 cmH20 Pressure Support:  [5 cmH20] 5 cmH20 Plateau Pressure:  [23 cmH20-32 cmH20] 28 cmH20   Intake/Output Summary (Last 24 hours) at 11/14/2021 0948 Last data filed at 11/14/2021 0400 Gross per 24 hour  Intake 2026.74 ml  Output 1075 ml  Net 951.74 ml    Filed Weights   11/12/21 0500 11/13/21 0341 11/14/21 0500  Weight: 63.3 kg 63.6 kg 61.6 kg   Physical Exam: General: Ill-appearing, thin male HENT: ET tube in place, bright red blood in canister at bedside Eyes: No scleral icterus. Tracking appropriately. Respiratory: Mechanical ventilation appreciated.  Bilateral rhonchi unchanged. Right chest tube in place Cardiovascular: regular rate and rhythm, No M,G,R GI: soft nontender, distant Extremities: Diffuse edema of upper and lower extremities   Neuro: Following some commands and answering questions with nodding of head yes and no  Resolved Hospital Problem list    Assessment & Plan:   Acute encephalopathy secondary respiratory arrest Acute hypoxemic respiratory failure secondary to suspected aspiration, post-obstructive pna and pleural effusion Resolution of encephalopathy. He was able to sleep a bit overnight with fentanyl and seroquel. We will restart propofol to keep him comfortable while awaiting extubation tomorrow.  -Full vent support -continue fentanyl PRN and start propofol ggt -Plan for extubation Friday, 9/22 when family is available to be present. 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  Febrile this morning to 100.8 with increasing leukocytosis to 52.7. Vancomycin restarted yesterday and today is last day of zosyn. Added fluconazole today.  -Cultures without growth. MRSA negative -Vanc/cefepime, add on fluconazole -Wean  pressors as able, MAP goal over 65 -Continue prednisone 40 daily  Non-oliguric AKI 2/2 septic shock-resolved Urinary tract infection Hemoglobinuria Cr stable. -trend Cr -Abx as per above -Foley catheter in place -Strict I/O  Metabolic acidosis secondary to shock Bicarb stable -daily bmp -treat adrenal insufficiency as per above  Anasarca Continue to hold Lasix and monitor volume status.  Normocytic Anemia Thrombocytopenia-resolved Hemoglobin decreased from 8.1-7.1.  Bleeding likely from his malignancy as he is continued to have hemoptysis -Hold anticoagulation -Continue PPI  Electrolyte derangements Continue free water for hypernatremia.  Replete potassium.  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 Hemoptysis this morning likely secondary to his malignancy.  -If able to be extubated and does well will need outpatient oncology follow-up  Ileus  Emesis Tube feeds, adjust glucose regimen per pharmacy -SSI, adjust as needed with restarting feeds glucose goal of 140-180 -Continue bowel regimen  Advance goals of care Patient endorsed not wanting to be reintubated if extubation fails.  We will have family at bedside tomorrow for extubation as likely to be terminal with his severe malignancy and overall illness.  Best Practice   Diet/type: tube feeds DVT prophylaxis: Hold heparin with hemoptysis GI prophylaxis: PPI Lines: Central line, a line. Right chest tube Foley: Foley catheter Code Status:  full code Last date of multidisciplinary goals of care discussion -discussed with sister on 9/19.   Labs   CBC: Recent Labs  Lab 11/10/21 0309 11/11/21 0329 11/12/21 0455 11/12/21 1930 11/13/21 0529 11/14/21 0450  WBC 60.9* 44.0* 37.1*  --  44.2* 52.7*  NEUTROABS 56.6* 40.9* 36.4*  --  42.4* 45.0*  HGB 8.6* 8.7*  7.4* 8.4* 8.1* 7.1*  HCT 25.0* 25.9* 22.3* 25.0* 23.4* 20.9*  MCV 91.9 92.5 93.3  --  91.8 94.6  PLT 127* 158 165  --  158 188     Basic Metabolic Panel: Recent Labs  Lab 11/11/21 0329 11/11/21 1230 11/11/21 1711 11/12/21 0455 11/12/21 1651 11/13/21 0529 11/13/21 1130 11/14/21 0450  NA 145  --   --  148* 149* 153*  --  151*  K 3.9  --   --  2.6* 4.0 3.6  --  3.4*  CL 114*  --   --  116* 121* 122*  --  123*  CO2 20*  --   --  20* 19* 21*  --  19*  GLUCOSE 90  --   --  98 151* 190*  --  158*  BUN 35*  --   --  43* 37* 34*  --  38*  CREATININE 1.07  --   --  1.23 1.11 1.03  --  1.11  CALCIUM 7.5*  --   --  7.8* 7.6* 8.1*  --  7.2*  MG 2.0   < > 2.1 2.1 2.0  --  2.1 2.0  PHOS 3.4   < > 3.9 2.6 2.6  --  2.0* 2.1*   < > = values in this interval not displayed.    GFR: Estimated Creatinine Clearance: 55.5 mL/min (by C-G formula based on SCr of 1.11 mg/dL). Recent Labs  Lab 11/08/21 0430 11/08/21 3893  11/08/21 1138 11/09/21 0420 11/11/21 0329 11/12/21 0455 11/13/21 0529 11/14/21 0450  PROCALCITON 8.45  --   --   --   --   --   --   --   WBC 66.3*  --   --    < > 44.0* 37.1* 44.2* 52.7*  LATICACIDVEN  --  2.2* 2.0*  --   --   --   --   --    < > = values in this interval not displayed.     Liver Function Tests: Recent Labs  Lab 11/08/21 0430  AST 25  ALT 10  ALKPHOS 63  BILITOT 0.6  PROT 4.4*  ALBUMIN <1.5*    Recent Labs  Lab 11/08/21 0430  LIPASE 16    No results for input(s): "AMMONIA" in the last 168 hours.  ABG    Component Value Date/Time   PHART 7.454 (H) 11/09/2021 1053   PCO2ART 28.5 (L) 11/09/2021 1053   PO2ART 56 (L) 11/09/2021 1053   HCO3 20.1 11/09/2021 1053   TCO2 21 (L) 11/09/2021 1053   ACIDBASEDEF 3.0 (H) 11/09/2021 1053   O2SAT 92 11/09/2021 1053     Coagulation Profile: Recent Labs  Lab 11/14/21 0450  INR 1.3*    Cardiac Enzymes: No results for input(s): "CKTOTAL", "CKMB", "CKMBINDEX", "TROPONINI" in the last 168  hours.  HbA1C: Hgb A1c MFr Bld  Date/Time Value Ref Range Status  11/07/2021 04:27 PM 5.7 (H) 4.8 - 5.6 % Final    Comment:    (NOTE) Pre diabetes:          5.7%-6.4%  Diabetes:              >6.4%  Glycemic control for   <7.0% adults with diabetes     CBG: Recent Labs  Lab 11/13/21 1610 11/13/21 1950 11/13/21 2336 11/14/21 0500 11/14/21 0756  GLUCAP 186* 168* 158* 160* 121*     Review of Systems:   Unable to obtain due to critical status  Past Medical History:  He,  has a past medical history of Allergy, Anxiety, Arthritis, Asthma, Atherosclerosis, Bipolar 1 disorder ([place]), Cataract, COPD (chronic obstructive pulmonary disease) ([place]), Depression, Elevated PSA, ETOH abuse, GERD (gastroesophageal reflux disease), Hyperlipidemia, Hypertension, and Right hip pain (04/13/2019).   Surgical History:   Past Surgical History:  Procedure Laterality Date   BACK SURGERY     lunbar   BRONCHIAL NEEDLE ASPIRATION BIOPSY  11/11/2021   Procedure: BRONCHIAL NEEDLE ASPIRATION BIOPSIES;  Surgeon: Candee Furbish, MD;  Location[facility] ENDOSCOPY;  Service: Pulmonary;;   COLONOSCOPY  12/21/09   YYT:KPTW papilla otherwise normal/ pancolonic diverticula/mutiple colonic poylps   COLONOSCOPY WITH ESOPHAGOGASTRODUODENOSCOPY (EGD) N/A 10/20/2012   Procedure: COLONOSCOPY WITH ESOPHAGOGASTRODUODENOSCOPY (EGD);  Surgeon: Daneil Dolin, MD;  Location: [facility] ENDO SUITE;  Service: Endoscopy;  Laterality: N/A;  10;15   CYSTOSCOPY N/A 01/31/2013   Procedure: CYSTOSCOPY FLEXIBLE;  Surgeon: Marissa Nestle, MD;  Location: [facility] ORS;  Service: Urology;  Laterality: N/A;  I would like to do this around 1 pm on monday.    HARDWARE REMOVAL Left 06/07/2013   Procedure: HARDWARE REMOVAL;  Surgeon: Mcarthur Rossetti, MD;  Location: [facility];  Service: Orthopedics;  Laterality: Left;   INTRAMEDULLARY (IM) NAIL INTERTROCHANTERIC Right 04/14/2019   Procedure: INTRAMEDULLARY (IM) NAIL INTERTROCHANTRIC;  Surgeon: Leandrew Koyanagi, MD;  Location: [place];  Service: Orthopedics;  Laterality: Right;   IR THORACENTESIS ASP PLEURAL SPACE W/IMG GUIDE  11/12/2021   ORIF HIP FRACTURE  Left 11/24/2012   Procedure: OPEN REDUCTION INTERNAL FIXATION HIP;  Surgeon: Sanjuana Kava, MD;  Location: [facility] ORS;  Service: Orthopedics;  Laterality: Left;   SPINE SURGERY     TOTAL HIP ARTHROPLASTY Left 06/07/2013   Procedure: REMOVE HARDWARE LEFT HIP AND LEFT TOTAL HIP ARTHROPLASTY ANTERIOR APPROACH;  Surgeon: Mcarthur Rossetti, MD;  Location: [place];  Service: Orthopedics;  Laterality: Left;   TRANSURETHRAL RESECTION OF PROSTATE N/A 03/22/2013   Procedure: TRANSURETHRAL RESECTION OF THE PROSTATE (TURP);  Surgeon: Marissa Nestle, MD;  Location: [facility] OR[facility];  Service: Urology;  Laterality: N/A;   VIDEO BRONCHOSCOPY WITH ENDOBRONCHIAL ULTRASOUND N/A 10/29/2021   Procedure: VIDEO BRONCHOSCOPY WITH ENDOBRONCHIAL ULTRASOUND;  Surgeon: Candee Furbish, MD;  Location: [facility] ENDOSCOPY;  Service: Pulmonary;  Laterality: N/A;     Social History:   reports that he has been smoking cigarettes. He has a 15.00 pack-year smoking history. He has never used smokeless tobacco. He reports that he does not currently use alcohol. He reports that he does not currently use drugs after having used the following drugs: Marijuana and Cocaine.   Family History:  His family history includes Mental illness in his sister. There is no history of Colon cancer.   Allergies No Known Allergies   Home Medications  Prior to Admission medications   Medication Sig Start Date End Date Taking? Authorizing Provider  acetaminophen (TYLENOL) 325 MG tablet Take 650 mg by mouth every 6 (six) hours as needed.   Yes [provider]  Carboxymethylcellul-Glycerin (CLEAR EYES FOR DRY EYES) 1-0.25 % SOLN Apply 1-2 drops to eye 2 (two) times daily as needed.   Yes [provider]  ibuprofen (ADVIL) 200 MG tablet Take 200 mg by mouth every 6 (six) hours as needed for moderate pain.    Yes [provider]  PROAIR HFA 108 (90 Base) MCG/ACT inhaler INHALE 2 PUFFS INTO THE LUNGS EVERY 4 TO 6 HOURS AS NEEDED. Patient taking differently: Inhale 2 puffs into the lungs every 4 (four) hours as needed for wheezing or shortness of breath. 08/28/16  Yes Susy Frizzle, MD  SYMBICORT 160-4.5 MCG/ACT inhaler Inhale 1 puff into the lungs 2 (two) times daily. 02/08/19  Yes [provider]  diclofenac Sodium (VOLTAREN) 1 % GEL Apply 2 g topically 4 (four) times daily. 11/04/20   Tacy Learn, PA-C  sertraline (ZOLOFT) 25 MG tablet Take 25 mg by mouth daily. 07/10/21   [provider]  tiZANidine (ZANAFLEX) 2 MG tablet Take 2 mg by mouth 2 (two) times daily as needed. 05/31/19   [provider]    [place]  Internal Medicine Resident PGY-3 [place]  Pager: [phone]

## 2021-11-15 ENCOUNTER — Encounter (HOSPITAL_COMMUNITY): Payer: Self-pay

## 2021-11-15 DIAGNOSIS — C3491 Malignant neoplasm of unspecified part of right bronchus or lung: Secondary | ICD-10-CM | POA: Diagnosis not present

## 2021-11-15 LAB — CBC WITH DIFFERENTIAL/PLATELET
Abs Immature Granulocytes: 0 10*3/uL (ref 0.00–0.07)
Basophils Absolute: 0 10*3/uL (ref 0.0–0.1)
Basophils Relative: 0 %
Eosinophils Absolute: 2.5 10*3/uL — ABNORMAL HIGH (ref 0.0–0.5)
Eosinophils Relative: 4 %
HCT: 20.8 % — ABNORMAL LOW (ref 39.0–52.0)
Hemoglobin: 7.1 g/dL — ABNORMAL LOW (ref 13.0–17.0)
Lymphocytes Relative: 1 %
Lymphs Abs: 0.6 10*3/uL — ABNORMAL LOW (ref 0.7–4.0)
MCH: 32.6 pg (ref 26.0–34.0)
MCHC: 34.1 g/dL (ref 30.0–36.0)
MCV: 95.4 fL (ref 80.0–100.0)
Monocytes Absolute: 2.5 10*3/uL — ABNORMAL HIGH (ref 0.1–1.0)
Monocytes Relative: 4 %
Neutro Abs: 56.2 10*3/uL — ABNORMAL HIGH (ref 1.7–7.7)
Neutrophils Relative %: 91 %
Platelets: 227 10*3/uL (ref 150–400)
RBC: 2.18 MIL/uL — ABNORMAL LOW (ref 4.22–5.81)
RDW: 15.2 % (ref 11.5–15.5)
WBC: 61.8 10*3/uL (ref 4.0–10.5)
nRBC: 0 /100 WBC
nRBC: 0.1 % (ref 0.0–0.2)

## 2021-11-15 LAB — GLUCOSE, CAPILLARY
Glucose-Capillary: 158 mg/dL — ABNORMAL HIGH (ref 70–99)
Glucose-Capillary: 155 mg/dL — ABNORMAL HIGH (ref 70–99)

## 2021-11-15 LAB — BASIC METABOLIC PANEL
Anion gap: 9 (ref 5–15)
BUN: 42 mg/dL — ABNORMAL HIGH (ref 8–23)
CO2: 19 mmol/L — ABNORMAL LOW (ref 22–32)
Calcium: 7.2 mg/dL — ABNORMAL LOW (ref 8.9–10.3)
Chloride: 119 mmol/L — ABNORMAL HIGH (ref 98–111)
Creatinine, Ser: 1.13 mg/dL (ref 0.61–1.24)
GFR, Estimated: >60 mL/min (ref >60)
Glucose, Bld: 179 mg/dL — ABNORMAL HIGH (ref 70–99)
Potassium: 3.2 mmol/L — ABNORMAL LOW (ref 3.5–5.1)
Sodium: 147 mmol/L — ABNORMAL HIGH (ref 135–145)

## 2021-11-15 LAB — PHOSPHORUS: Phosphorus: 2.6 mg/dL (ref 2.5–4.6)

## 2021-11-15 LAB — MAGNESIUM: Magnesium: 1.9 mg/dL (ref 1.7–2.4)

## 2021-11-15 LAB — TRIGLYCERIDES: Triglycerides: 167 mg/dL — ABNORMAL HIGH (ref <150)

## 2021-11-15 MED ORDER — GLYCOPYRROLATE 1 MG PO TABS: 1.0000 mg | ORAL_TABLET | ORAL | Status: DC | PRN

## 2021-11-15 MED ORDER — MORPHINE 100MG IN NS 100ML (1MG/ML) PREMIX INFUSION
0.0000 mg/h | INTRAVENOUS | Status: DC
  Administered 2021-11-15: 5 mg/h via INTRAVENOUS
  Filled 2021-11-15 (×2): qty 100

## 2021-11-15 MED ORDER — SODIUM CHLORIDE 0.9 % IV SOLN: INTRAVENOUS | Status: DC

## 2021-11-15 MED ORDER — POLYVINYL ALCOHOL 1.4 % OP SOLN: 1.0000 [drp] | Freq: Four times a day (QID) | OPHTHALMIC | Status: DC | PRN

## 2021-11-15 MED ORDER — POTASSIUM CHLORIDE 10 MEQ/50ML IV SOLN
10.0000 meq | INTRAVENOUS | Status: AC
  Administered 2021-11-15 (×4): 10 meq via INTRAVENOUS
  Filled 2021-11-15 (×4): qty 50

## 2021-11-15 MED ORDER — MAGNESIUM SULFATE 2 GM/50ML IV SOLN
2.0000 g | Freq: Once | INTRAVENOUS | Status: AC
  Administered 2021-11-15: 2 g via INTRAVENOUS
  Filled 2021-11-15: qty 50

## 2021-11-15 MED ORDER — VITAL AF 1.2 CAL PO LIQD: 1000.0000 mL | ORAL | Status: DC

## 2021-11-15 MED ORDER — ACETAMINOPHEN 325 MG PO TABS: 650.0000 mg | ORAL_TABLET | Freq: Four times a day (QID) | ORAL | Status: DC | PRN

## 2021-11-15 MED ORDER — GLYCOPYRROLATE 0.2 MG/ML IJ SOLN: 0.2000 mg | INTRAMUSCULAR | Status: DC | PRN

## 2021-11-15 MED ORDER — ACETAMINOPHEN 650 MG RE SUPP: 650.0000 mg | Freq: Four times a day (QID) | RECTAL | Status: DC | PRN

## 2021-11-15 MED ORDER — MORPHINE BOLUS VIA INFUSION: 5.0000 mg | INTRAVENOUS | Status: DC | PRN

## 2021-11-15 MED ORDER — POTASSIUM CHLORIDE 20 MEQ PO PACK
20.0000 meq | PACK | ORAL | Status: AC
  Administered 2021-11-15 (×2): 20 meq
  Filled 2021-11-15 (×2): qty 1

## 2021-11-16 LAB — CULTURE, RESPIRATORY W GRAM STAIN

## 2021-11-24 NOTE — Death Summary Note (Signed)
Death Summary  Name: Duane Richardson MRN: [id number] DOB: 1955-06-23 66 y.o.  Date of Admission: 10/25/2021  4:37 PM Date of Discharge: 14-Dec-2021 Attending Physician: Laurin Coder, MD  Discharge Diagnosis: Principal Problem:   Stage IV adenocarcinoma of lung, right ([place]) Active Problems:   TOBACCO ABUSE   Depression   Essential hypertension   Pulmonary nodule   Malignant pleural effusion   Multiple lesions of metastatic malignancy (HCC)   Leukocytosis   Hypoalbuminemia due to protein-calorie malnutrition (HCC)   Failure to thrive in adult   Acute respiratory failure (HCC)   Acute metabolic encephalopathy   Septic shock due to undetermined organism (HCC)   AKI (acute kidney injury) ([place])   Lactic acid acidosis   Hypotension due to drugs  Cause of death: Acute Hypoxic Respiratory Failure Secondary to Adenocarcinoma of The Lung Time of death: 11:28  Disposition and follow-up:   Mr.Duane Richardson was discharged from [facility] in expired condition.    Hospital Course: 66 year old with tobacco abuse with COPD who presents with cough, hemoptysis, weight loss, fatigue. Imaging markedly abnormal and PCCM consulted. CTA neg for PE with large right pleural effusion and RUL mass measuring 5.3 x 3.8 cm with mass effect of right mainstem, BI, RML and RLLL with pleural based nodules, centrilobular emphysema. Had thoracentesis on 9/8 with 2L exudative bloody fluid 275.  CT A/P with probable hepatic, left adrenal and left perinrenal mets. Underwent EBUS 9/8 with sampling of 4R.  9/10 patient had worsening respiratory status requiring 15L O2 with concern for aspiration. Rapid response called and patient had agonal breathing.He was intubated and transferred to the ICU. He had two thoracentesis with multiple liters of drainage and required chest tube placement. He required multiple vasopressors and started on steroids with concern of adrenal insufficiency. His course was further  complicated by elevated white count with concern for possible obstructive pneumonia. With the severity of his illness and continued hemoptysis, the decision was made to extubate with expectation of terminal extubation. This was confirmed by the patient and his sister, Duane Richardson. He was extubated and immediately desaturated with dyspnea. Morphine was started and he passed within an hour.   SignedRiesa Pope, MD 14-Dec-2021, 11:41 AM

## 2021-11-24 NOTE — Progress Notes (Signed)
Pt extubated to comfort care per order.

## 2021-11-24 NOTE — IPAL (Signed)
  Interdisciplinary Goals of Care Family Meeting   Date carried out: Nov 17, 2021  Location of the meeting: Bedside  Member's involved: Physician, Bedside Registered Nurse, and Family Member or next of kin  Durable Power of Attorney or acting medical decision maker: Sister, Kathaleen Maser  Discussion: We discussed goals of care for Duane Richardson. His sister Watt Climes understands the situation and that this extubation is likely terminal with the severity of his malignancy and respiratory compromise. Yesterday patient requested not to be reintubated if struggled after extubation, his sister agrees with this. Mr. Ribaudo was placed on SBT and appeared to have difficulty with respirations. Will have medications at bedside once extubated to initiate comfort measures. Likely in-hospital death within in hours.     Code status: Full DNR  Disposition: In-patient comfort care  Time spent for the meeting: 10  minutes   Sanjuana Letters, MD  Nov 17, 2021, 10:07 AM

## 2021-11-24 NOTE — Progress Notes (Signed)
Pt expired at 11:28 without s/s pain/discomfort. Sister bedside at  patients passing. Patient then prepared for transport to morgue.

## 2021-11-24 NOTE — Progress Notes (Signed)
NAME:  Duane Richardson, MRN:  [id number], DOB:  1955-12-06, LOS: 19 ADMISSION DATE:  11/21/2021, CONSULTATION DATE:  11/14/2021 REFERRING MD:  Terrilee Croak, MD CHIEF COMPLAINT:  Hemoptysis, Respiratory arrest   History of Present Illness:  66 year old with tobacco abuse with COPD who presents with cough, hemoptysis, weight loss, fatigue. Imaging markedly abnormal and PCCM consulted. CTA neg for PE with large right pleural effusion and RUL mass measuring 5.3 x 3.8 cm with mass effect of right mainstem, BI, RML and RLLL with pleural based nodules, centrilobular emphysema. Had thoracentesis on 9/8 with 2L exudative bloody fluid 275.  CT A/P with probable hepatic, left adrenal and left perinrenal mets. Underwent EBUS 9/8 with sampling of 4R. He remained in the hospital for work-up for abdominal pain and constipation  9/10 patient had worsening respiratory status requiring 15L O2 with concern for aspiration. Rapid response called and patient had agonal breathing. Pulses reported intact. He was intubated on the floor. PCCM consulted for transfer.  Pertinent  Medical History  COPD DJD post THA HTN HLD  Significant Hospital Events: Including procedures, antibiotic start and stop dates in addition to other pertinent events   9/7 Admitted and thoracentesis 9/8 EBUS 9/10 Respiratory arrest and intubated on floor 9/11 Central line placed 9/12 Bedside US guided right pleural thoracentesis 9/14 Chest tube placed  Interim History / Subjective:  Rested overnight, continued to have some hemoptysis. Required levophed with increasing amounts of propofol. Sister at bedside this morning and plan for SBT and wean sedatives for him to become more responsive. Will plan to extubate, transitioned to DNR/DNI at request of patient yesterday and sister who is in agreement.   Objective   Blood pressure (!) 78/54, pulse 94, temperature 98.7 F (37.1 C), temperature source Axillary, resp. rate (!) 30, height 6\' 2"  (1.88 m),  weight 61.6 kg, SpO2 92 %.    Vent Mode: PRVC FiO2 (%):  [30 %] 30 % Set Rate:  [20 bmp] 20 bmp Vt Set:  [650 mL] 650 mL PEEP:  [5 cmH20] 5 cmH20 Pressure Support:  [5 cmH20] 5 cmH20 Plateau Pressure:  [24 cmH20-29 cmH20] 24 cmH20   Intake/Output Summary (Last 24 hours) at 2021-12-11 0644 Last data filed at 2021/12/11 0600 Gross per 24 hour  Intake 2901.3 ml  Output 1488 ml  Net 1413.3 ml    Filed Weights   11/12/21 0500 11/13/21 0341 11/14/21 0500  Weight: 63.3 kg 63.6 kg 61.6 kg   Physical Exam: General: Ill-appearing, thin male HENT: ET tube in place Eyes: No scleral icterus. Tracking appropriately. Respiratory: Mechanical ventilation appreciated.  Bilateral rhonchi unchanged. Right chest tube in place Cardiovascular: regular rate and rhythm, No M,G,R GI: soft nontender, distant Extremities: Diffuse edema of upper and lower extremities   Neuro: following some commands  Resolved Hospital Problem list    Assessment & Plan:   Acute encephalopathy secondary respiratory arrest Acute hypoxemic respiratory failure secondary to suspected aspiration, post-obstructive pna and pleural effusion Resolution of encephalopathy. Was able to rest with propofol. Will discontinue this morning so he will be more interactive with his sister at bedside. Plan to extubate this morning.  -Transition to SBT and extubate when family is ready. Will have morphine at bedside in the event he becomes uncomfortable will initiate comfort measures at that time.  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  Continued on abx, possible etiology for febrile state and continued leukocytosis may  be obstructive pneumonia or secondary to his malignancy.  -will discontinue abx once transitioned to comfort care -Vanc, cefepime, fluconazole  Non-oliguric AKI 2/2 septic shock-resolved Urinary tract  infection Hemoglobinuria Cr stable.  Anasarca Continue to hold Lasix and monitor volume status.  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 Hemoptysis this morning likely secondary to his malignancy.  -Plan for extubation today, likely terminal  Best Practice   Diet/type: tube feeds DVT prophylaxis: Hold heparin with hemoptysis GI prophylaxis: PPI Lines: Central line, a line. Right chest tube Foley: Foley catheter Code Status:  Transition to DNR Last date of multidisciplinary goals of care discussion sister at bedside and discussed plan today.  Labs   CBC: Recent Labs  Lab 11/11/21 0329 11/12/21 0455 11/12/21 1930 11/13/21 0529 11/14/21 0450 11/25/2021 0343  WBC 44.0* 37.1*  --  44.2* 52.7* 61.8*  NEUTROABS 40.9* 36.4*  --  42.4* 45.0* 56.2*  HGB 8.7* 7.4* 8.4* 8.1* 7.1* 7.1*  HCT 25.9* 22.3* 25.0* 23.4* 20.9* 20.8*  MCV 92.5 93.3  --  91.8 94.6 95.4  PLT 158 165  --  158 188 227     Basic Metabolic Panel: Recent Labs  Lab 11/12/21 0455 11/12/21 1651 11/13/21 0529 11/13/21 1130 11/14/21 0450 11/14/21 2149 2021-11-25 0343  NA 148* 149* 153*  --  151*  --  147*  K 2.6* 4.0 3.6  --  3.4*  --  3.2*  CL 116* 121* 122*  --  123*  --  119*  CO2 20* 19* 21*  --  19*  --  19*  GLUCOSE 98 151* 190*  --  158*  --  179*  BUN 43* 37* 34*  --  38*  --  42*  CREATININE 1.23 1.11 1.03  --  1.11  --  1.13  CALCIUM 7.8* 7.6* 8.1*  --  7.2*  --  7.2*  MG 2.1 2.0  --  2.1 2.0 1.8 1.9  PHOS 2.6 2.6  --  2.0* 2.1* 2.4* 2.6    GFR: Estimated Creatinine Clearance: 54.5 mL/min (by C-G formula based on SCr of 1.13 mg/dL). Recent Labs  Lab 11/08/21 0743 11/08/21 1138 11/09/21 0420  11/12/21 0455 11/13/21 0529 11/14/21 0450 11-25-21 0343  WBC  --   --    < > 37.1* 44.2* 52.7* 61.8*  LATICACIDVEN 2.2* 2.0*  --   --   --   --   --    < > = values in this interval not displayed.     Liver Function Tests: No results for input(s): "AST", "ALT", "ALKPHOS", "BILITOT", "PROT", "ALBUMIN" in the last 168 hours.  No results for input(s): "LIPASE", "AMYLASE" in the last 168 hours.  No results for input(s): "AMMONIA" in the last 168 hours.  ABG    Component Value Date/Time   PHART 7.454 (H) 11/09/2021 1053   PCO2ART 28.5 (L) 11/09/2021 1053   PO2ART 56 (L) 11/09/2021 1053   HCO3 20.1 11/09/2021 1053   TCO2 21 (L) 11/09/2021 1053   ACIDBASEDEF 3.0 (H) 11/09/2021 1053   O2SAT 92 11/09/2021 1053     Coagulation Profile: Recent Labs  Lab 11/14/21 0450  INR 1.3*     Cardiac Enzymes: No results for input(s): "CKTOTAL", "CKMB", "CKMBINDEX", "TROPONINI" in the last 168 hours.  HbA1C: Hgb A1c MFr Bld  Date/Time Value Ref Range Status  11/07/2021 04:27 PM 5.7 (H) 4.8 - 5.6 % Final    Comment:    (NOTE) Pre diabetes:          5.7%-6.4%  Diabetes:              >6.4%  Glycemic control for   <7.0% adults with diabetes     CBG: Recent Labs  Lab 11/14/21 1119 11/14/21 1528 11/14/21 1954 11/14/21 2338 11-22-21 0340  GLUCAP 146* 199* 194* 160* 158*     Review of Systems:   Unable to obtain due to critical status  Past Medical History:  He,  has a past medical history of Allergy, Anxiety, Arthritis, Asthma, Atherosclerosis, Bipolar 1 disorder ([place]), Cataract, COPD (chronic obstructive pulmonary disease) ([place]), Depression, Elevated PSA, ETOH abuse, GERD (gastroesophageal reflux disease), Hyperlipidemia, Hypertension, and Right hip pain (04/13/2019).   Surgical History:   Past Surgical History:  Procedure Laterality Date   BACK SURGERY     lunbar   BRONCHIAL NEEDLE ASPIRATION BIOPSY  10/28/2021   Procedure: BRONCHIAL NEEDLE ASPIRATION BIOPSIES;   Surgeon: Candee Furbish, MD;  Location: [facility] ENDOSCOPY;  Service: Pulmonary;;   COLONOSCOPY  12/21/09   QIH:KVQQ papilla otherwise normal/ pancolonic diverticula/mutiple colonic poylps   COLONOSCOPY WITH ESOPHAGOGASTRODUODENOSCOPY (EGD) N/A 10/20/2012   Procedure: COLONOSCOPY WITH ESOPHAGOGASTRODUODENOSCOPY (EGD);  Surgeon: Daneil Dolin, MD;  Location: [facility];  Service: Endoscopy;  Laterality: N/A;  10;15   CYSTOSCOPY N/A 01/31/2013   Procedure: CYSTOSCOPY FLEXIBLE;  Surgeon: Marissa Nestle, MD;  Location: [facility] ORS;  Service: Urology;  Laterality: N/A;  I would like to do this around 1 pm on monday.    HARDWARE REMOVAL Left 06/07/2013   Procedure: HARDWARE REMOVAL;  Surgeon: Mcarthur Rossetti, MD;  Location: [place];  Service: Orthopedics;  Laterality: Left;   INTRAMEDULLARY (IM) NAIL INTERTROCHANTERIC Right 04/14/2019   Procedure: INTRAMEDULLARY (IM) NAIL INTERTROCHANTRIC;  Surgeon: Leandrew Koyanagi, MD;  Location: [place];  Service: Orthopedics;  Laterality: Right;   IR THORACENTESIS ASP PLEURAL SPACE W/IMG GUIDE  11/09/2021   ORIF HIP FRACTURE Left 11/24/2012   Procedure: OPEN REDUCTION INTERNAL FIXATION HIP;  Surgeon: Sanjuana Kava, MD;  Location: [facility];  Service: Orthopedics;  Laterality: Left;   SPINE SURGERY     TOTAL HIP ARTHROPLASTY Left 06/07/2013   Procedure: REMOVE HARDWARE LEFT HIP AND LEFT TOTAL HIP ARTHROPLASTY ANTERIOR APPROACH;  Surgeon: Mcarthur Rossetti, MD;  Location: [place];  Service: Orthopedics;  Laterality: Left;   TRANSURETHRAL RESECTION OF PROSTATE N/A 03/22/2013   Procedure: TRANSURETHRAL RESECTION OF THE PROSTATE (TURP);  Surgeon: Marissa Nestle, MD;  Location: [facility];  Service: Urology;  Laterality: N/A;   VIDEO BRONCHOSCOPY WITH ENDOBRONCHIAL ULTRASOUND N/A 11/06/2021   Procedure: VIDEO BRONCHOSCOPY WITH ENDOBRONCHIAL ULTRASOUND;  Surgeon: Candee Furbish, MD;  Location[facility] END[facility]COPY;  Service: Pulmonary;  Laterality: N/A;     Social History:   reports that  he has been smoking cigarettes. He has a 15.00 pack-year smoking history. He has never used smokeless tobacco. He reports that he does not currently use alcohol. He reports that he does not currently use drugs after having used the following drugs: Marijuana and Cocaine.   Family History:  His family history includes Mental illness in his sister. There is no history of Colon cancer.   Allergies No Known Allergies   Home Medications  Prior to Admission medications   Medication Sig Start Date  End Date Taking? Authorizing Provider  acetaminophen (TYLENOL) 325 MG tablet Take 650 mg by mouth every 6 (six) hours as needed.   Yes [provider]  Carboxymethylcellul-Glycerin (CLEAR EYES FOR DRY EYES) 1-0.25 % SOLN Apply 1-2 drops to eye 2 (two) times daily as needed.   Yes [provider]  ibuprofen (ADVIL) 200 MG tablet Take 200 mg by mouth every 6 (six) hours as needed for moderate pain.   Yes [provider]  PROAIR HFA 108 (90 Base) MCG/ACT inhaler INHALE 2 PUFFS INTO THE LUNGS EVERY 4 TO 6 HOURS AS NEEDED. Patient taking differently: Inhale 2 puffs into the lungs every 4 (four) hours as needed for wheezing or shortness of breath. 08/28/16  Yes Susy Frizzle, MD  SYMBICORT 160-4.5 MCG/ACT inhaler Inhale 1 puff into the lungs 2 (two) times daily. 02/08/19  Yes [provider]  diclofenac Sodium (VOLTAREN) 1 % GEL Apply 2 g topically 4 (four) times daily. 11/04/20   Tacy Learn, PA-C  sertraline (ZOLOFT) 25 MG tablet Take 25 mg by mouth daily. 07/10/21   [provider]  tiZANidine (ZANAFLEX) 2 MG tablet Take 2 mg by mouth 2 (two) times daily as needed. 05/31/19   [provider]    [place]  Internal Medicine Resident PGY-3 [place]  Pager: [phone]

## 2021-11-24 NOTE — Progress Notes (Signed)
[facility] ADULT ICU REPLACEMENT PROTOCOL   The patient does apply for the [facility] Adult ICU Electrolyte Replacment Protocol based on the criteria listed below:   1.Exclusion criteria: TCTS patients, ECMO patients, and Dialysis patients 2. Is GFR >/= 30 ml/min? Yes.    Patient's GFR today is >60 3. Is SCr </= 2? Yes.   Patient's SCr is 1.13 mg/dL 4. Did SCr increase >/= 0.5 in 24 hours? No. 5.Pt's weight >40kg  Yes.   6. Abnormal electrolyte(s): K+ 3.2, mag 1.9  7. Electrolytes replaced per protocol 8.  Call MD STAT for K+ </= 2.5, Phos </= 1, or Mag </= 1 Physician:  n/a  Darlys Gales 2021-11-28 4:37 AM

## 2021-11-24 DEATH — deceased

## 2021-11-27 ENCOUNTER — Ambulatory Visit: Payer: Medicare Other | Admitting: Podiatry

## 2021-12-04 ENCOUNTER — Encounter (HOSPITAL_COMMUNITY): Payer: Self-pay

## 2021-12-25 NOTE — Progress Notes (Signed)
Anemia of chronic disease

## 2021-12-25 NOTE — Progress Notes (Signed)
Acute hypoxemic respiratory failure likely secondary to postobstructive pneumonia and pleural effusion

## 2022-01-01 MED FILL — Medication: Qty: 1 | Status: AC

## 2023-09-10 IMAGING — CT CT HEAD W/O CM
3 of 4 series · 13 of 47 positions shown, 15 images · non-contrast
Comparison: None Available.

CLINICAL DATA: Fall from wheelchair, hit head



[Series 4: head ax w o · axial · 0.32mm/px · z∈[-11,+124]mm · 7 of 38 slices shown, 9 images]
[im 5/38  brain]
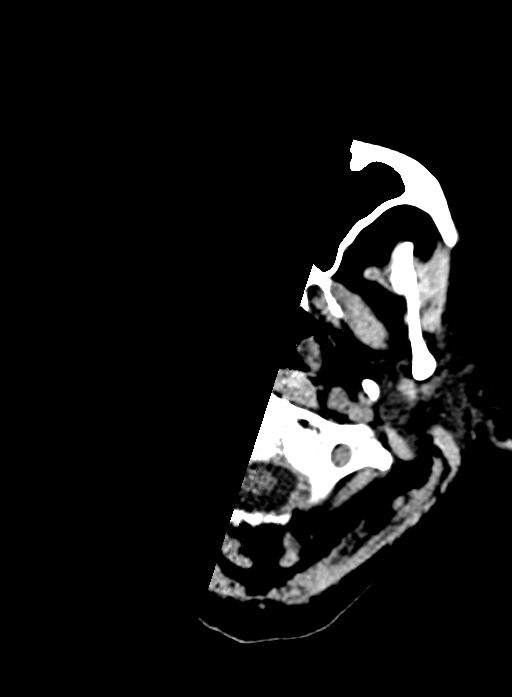
[im 5/38  bone]
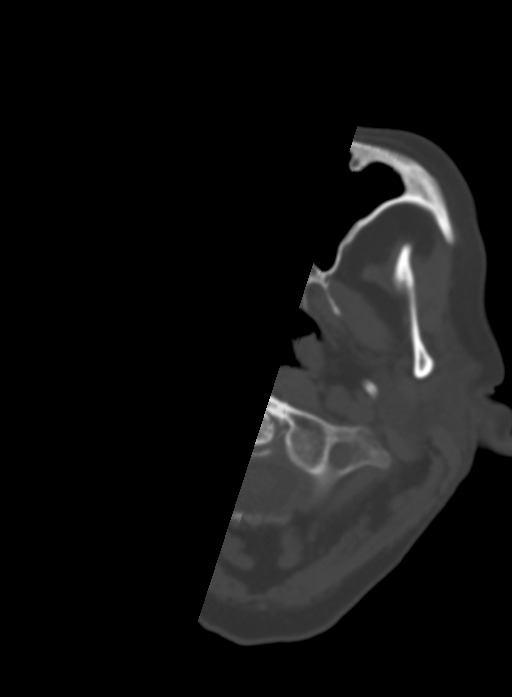
[im 10/38  brain]
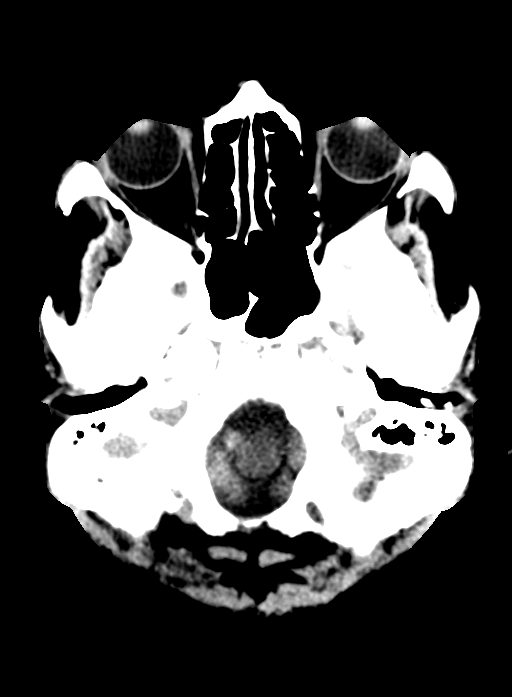
[im 14/38  brain]
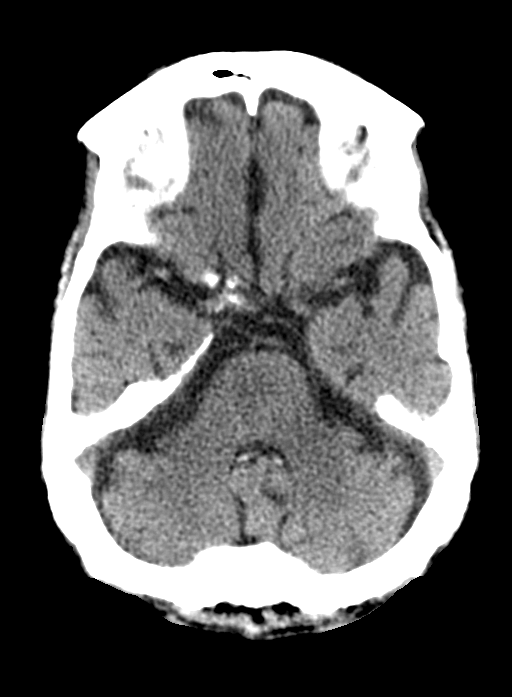
[im 19/38  brain]
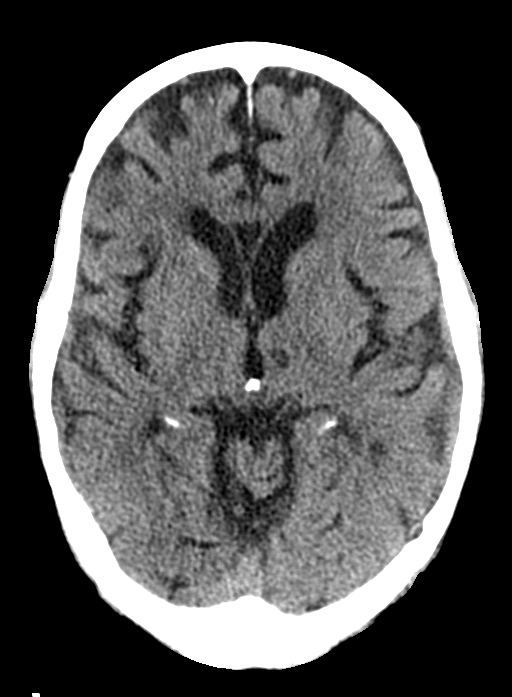
[im 24/38  brain]
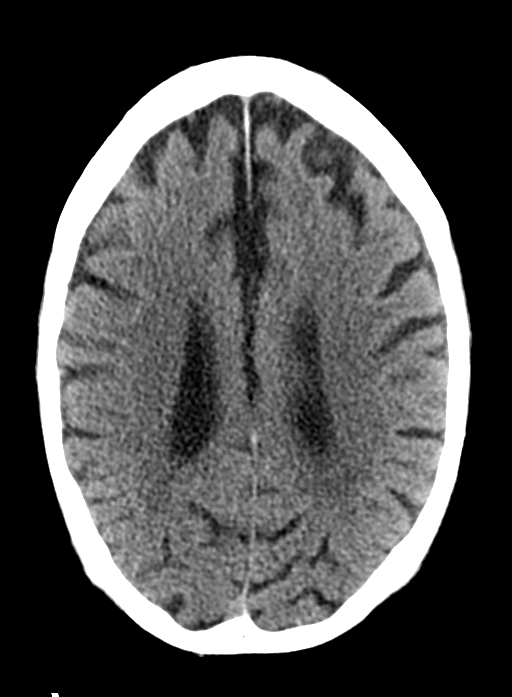
[im 24/38  bone]
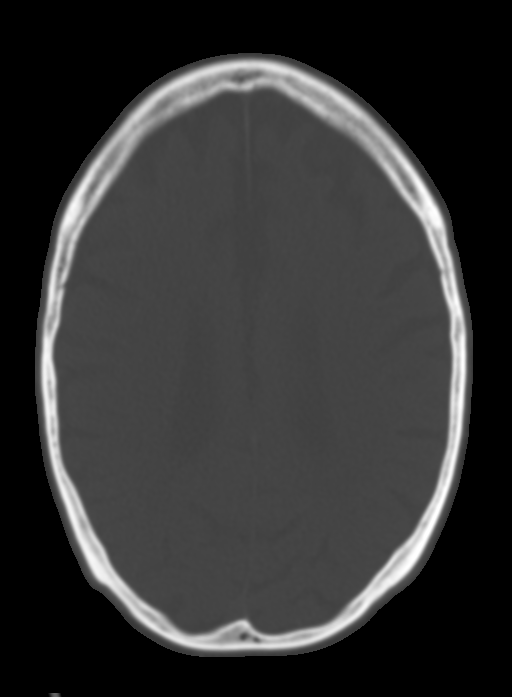
[im 28/38  brain]
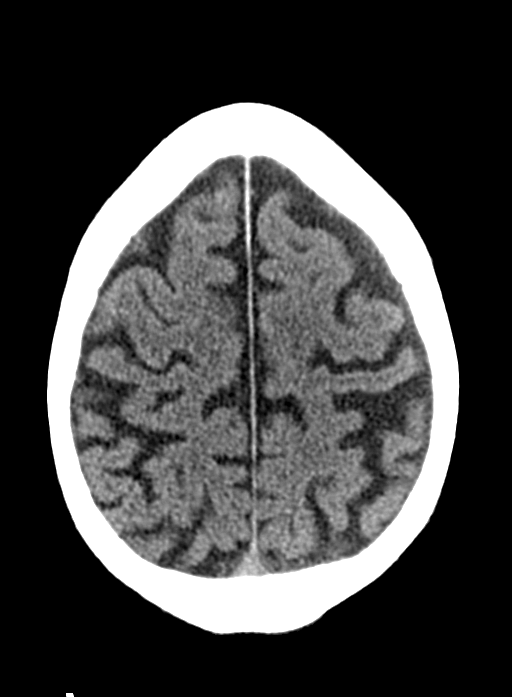
[im 33/38  brain]
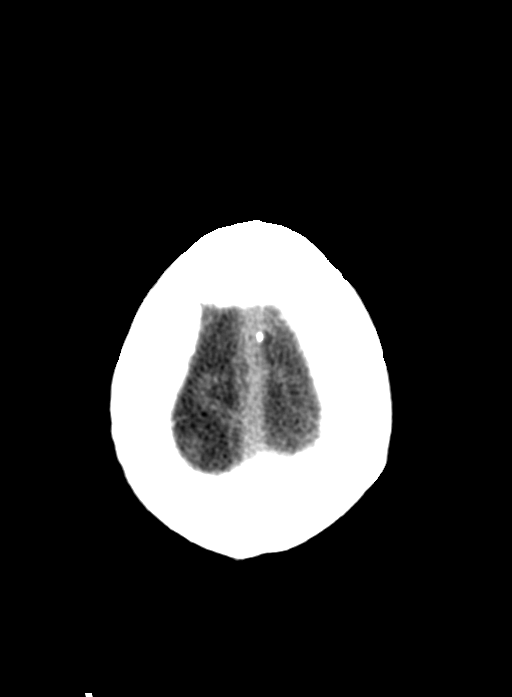

[Series 9: sagittal soft · sagittal · 0.36mm/px · 3 of 57 slices shown]
[im 24/57  brain]
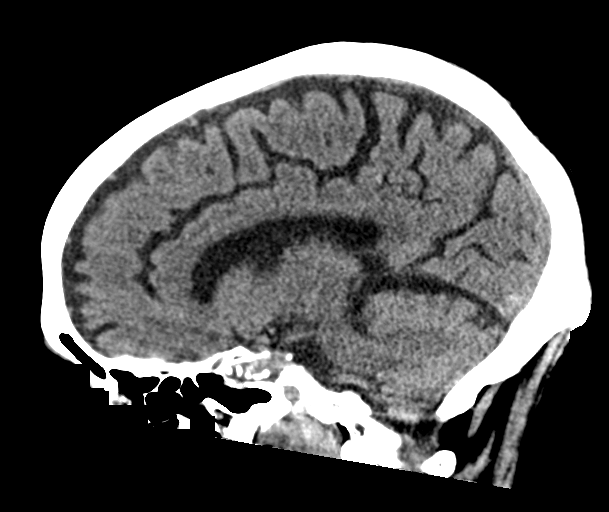
[im 29/57  brain]
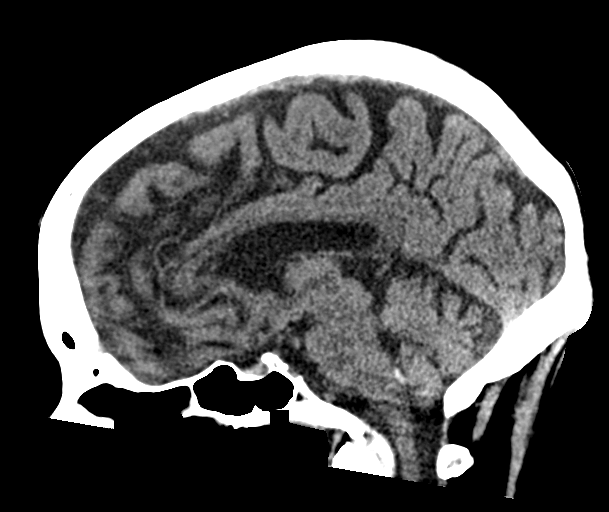
[im 34/57  brain]
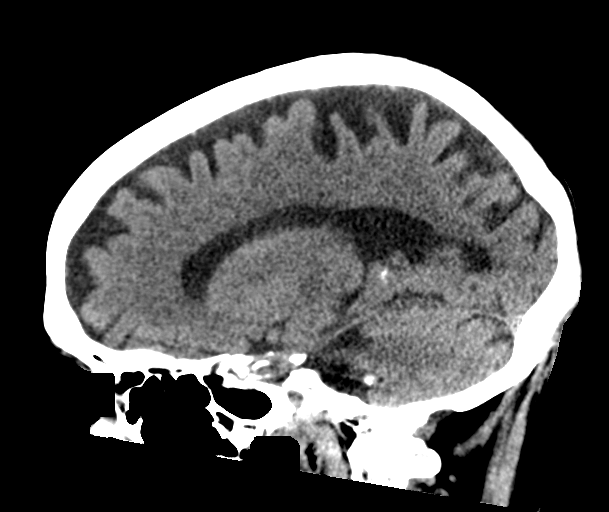

[Series 10: coronal soft · coronal · 0.33mm/px · 3 of 74 slices shown]
[im 25/74  brain]
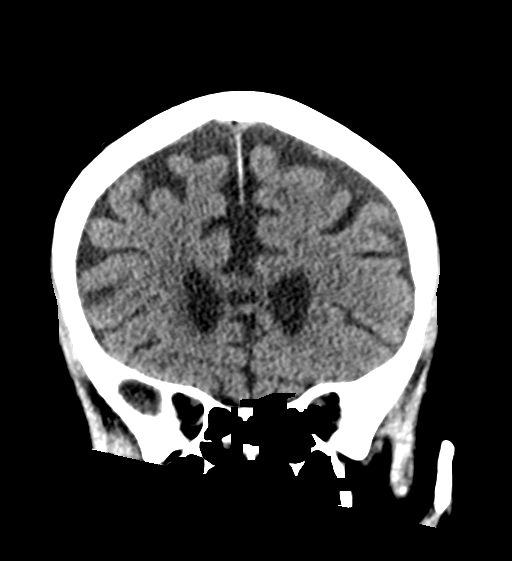
[im 33/74  brain]
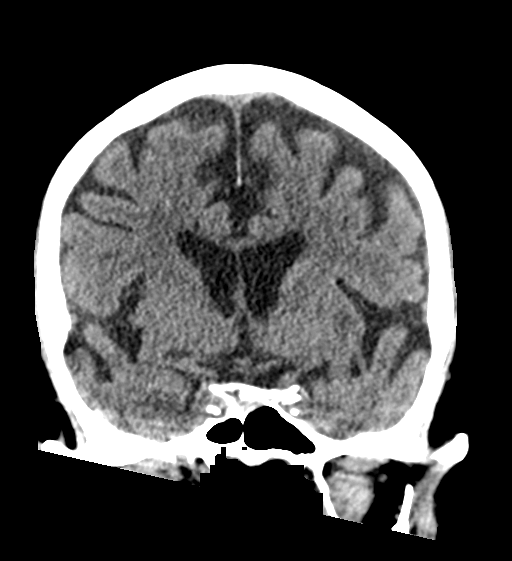
[im 41/74  brain]
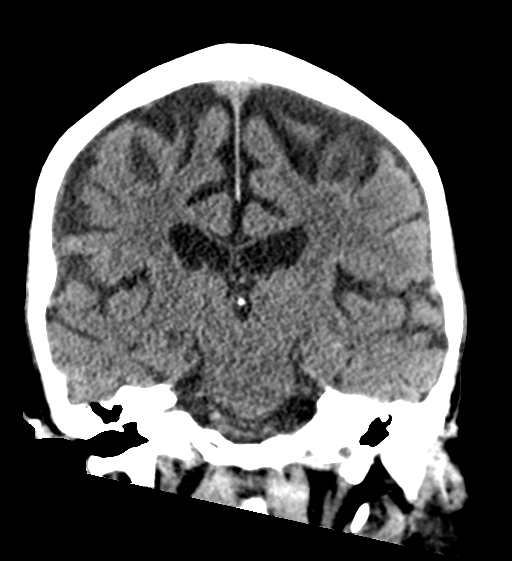

[13 of 47 positions shown; findings below may reference images not displayed]

FINDINGS: Brain: No evidence of acute infarction, hemorrhage, cerebral edema,
mass, mass effect, or midline shift. No hydrocephalus or extra-axial
fluid collection.

Vascular: No hyperdense vessel. Atherosclerotic calcifications in
the intracranial carotid and vertebral arteries.

Skull: Normal. Negative for fracture or focal lesion.

Sinuses/Orbits: No acute finding.

Other: The mastoid air cells are well aerated.
IMPRESSION: No acute intracranial process.

## 2023-09-10 IMAGING — DX DG HIP (WITH OR WITHOUT PELVIS) 2-3V*L*
3 series · 3 of 3 positions shown · non-contrast
Comparison: July 02, 2019

CLINICAL DATA: Status post fall.

EXAM:
DG HIP (WITH OR WITHOUT PELVIS) 2-3V LEFT

[hip ap]
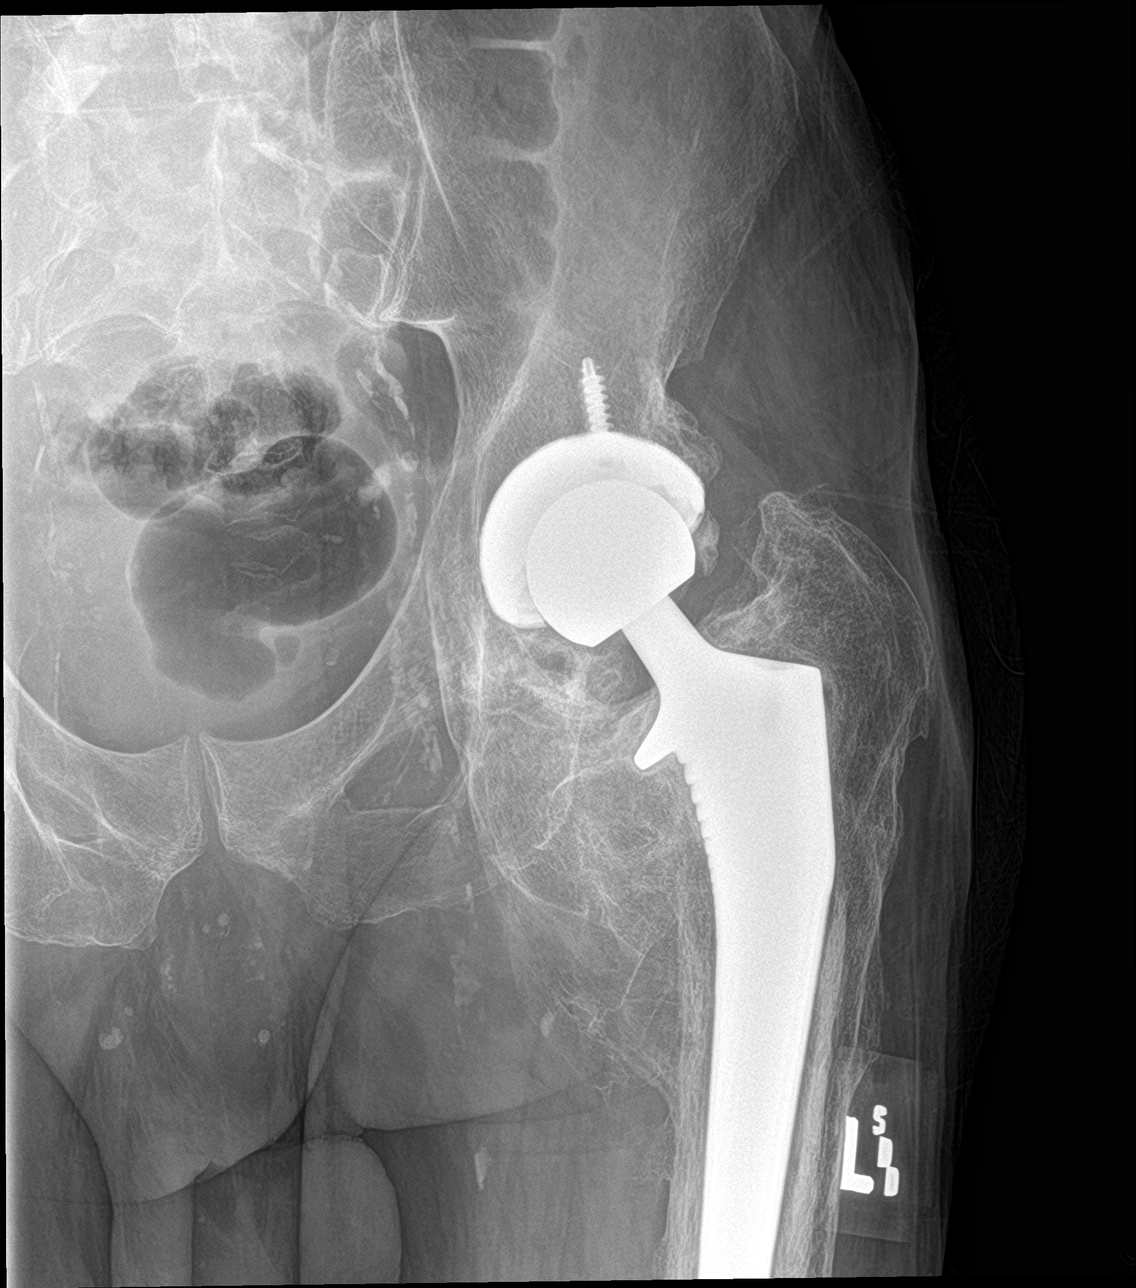

[hip lat]
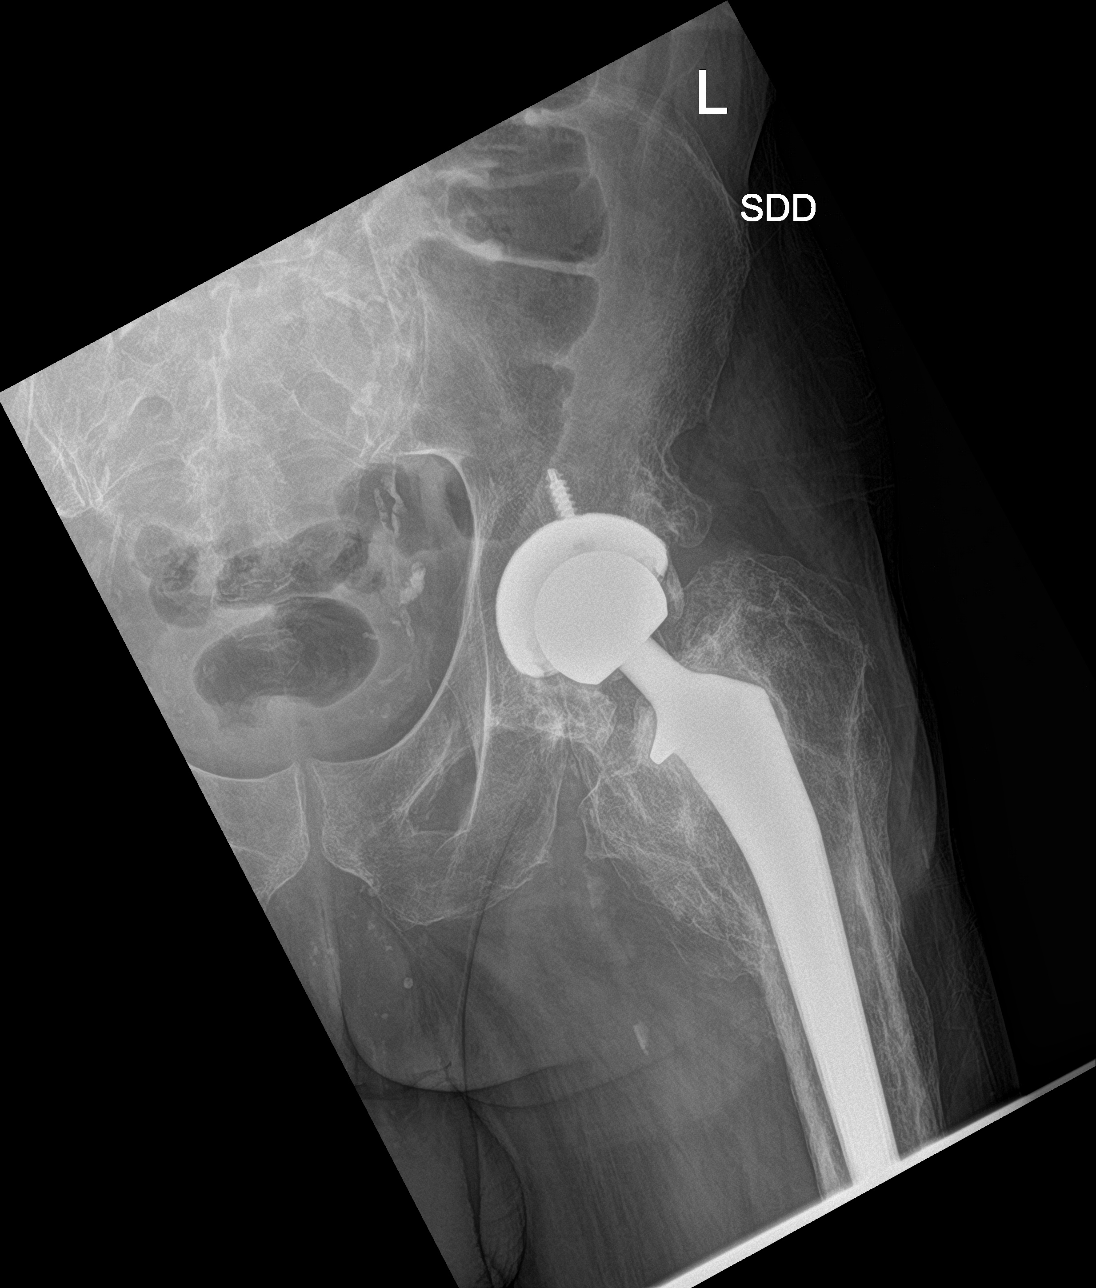

[pelvis ap]
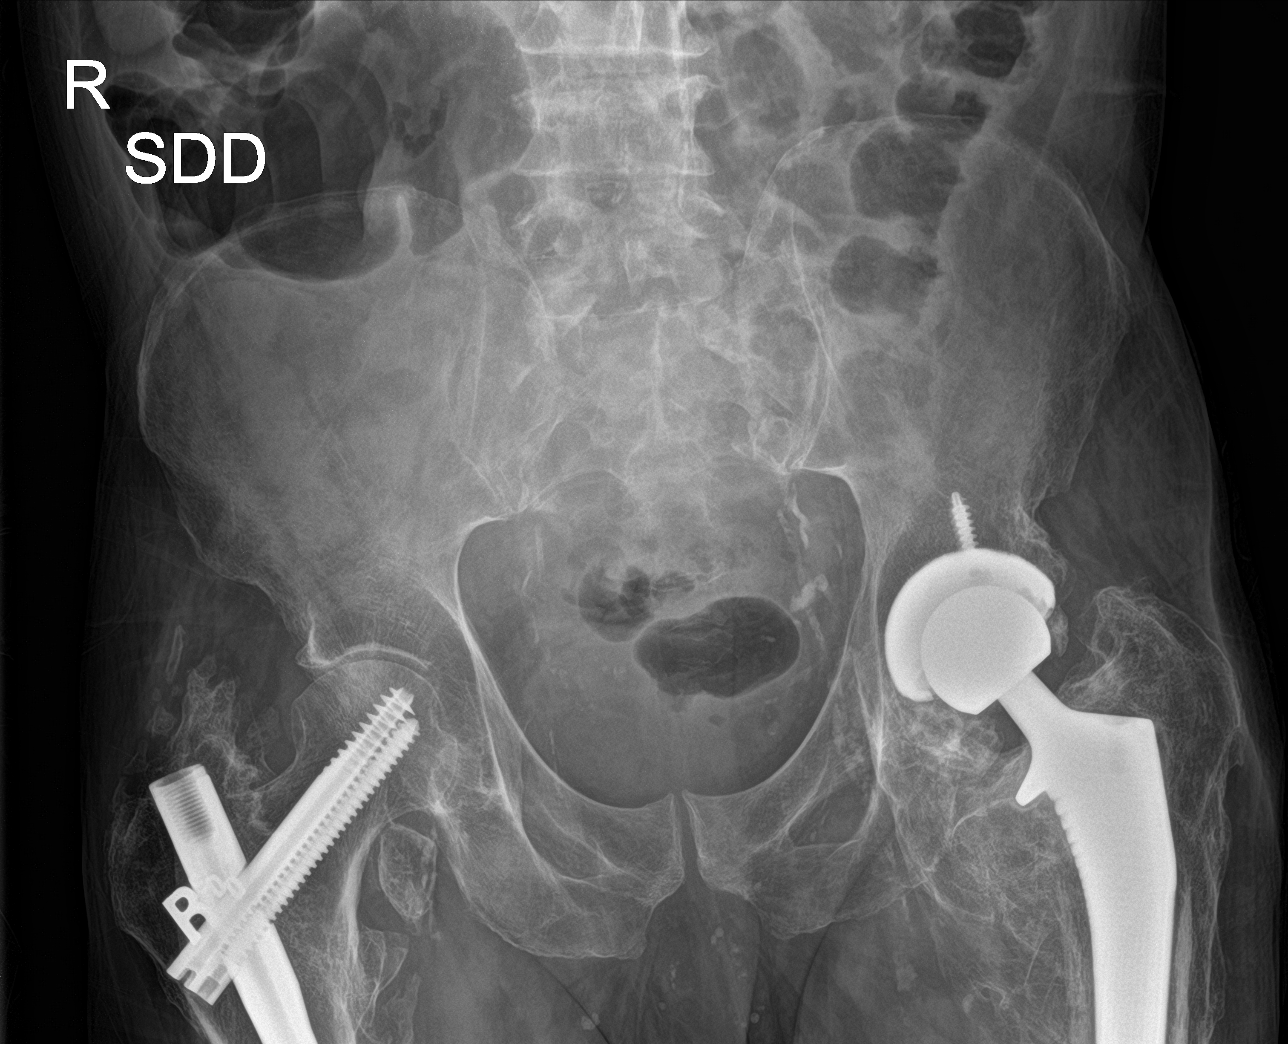

[3 of 3 positions shown; findings below may reference images not displayed]

FINDINGS: A total left hip replacement is seen. There is no evidence of
surrounding lucency to suggest the presence of hardware loosening or
infection. A radiopaque intramedullary rod and compression screw
device are seen within the proximal right femur. There is no
evidence of acute hip fracture or dislocation. Extensive chronic
changes are seen involving the proximal left femur and region
adjacent to the left acetabulum.
IMPRESSION: 1. No acute osseous abnormality.
2. Left hip replacement without evidence of hardware loosening or
infection.
